# Patient Record
Sex: Female | Born: 1935
Health system: Southern US, Community
[De-identification: ages and names within clinical notes are randomized; demographics above are authoritative.]

## PROBLEM LIST (undated history)

## (undated) DIAGNOSIS — M810 Age-related osteoporosis without current pathological fracture: Secondary | ICD-10-CM

## (undated) DIAGNOSIS — D6851 Activated protein C resistance: Secondary | ICD-10-CM

## (undated) DIAGNOSIS — N189 Chronic kidney disease, unspecified: Secondary | ICD-10-CM

## (undated) DIAGNOSIS — K56609 Unspecified intestinal obstruction, unspecified as to partial versus complete obstruction: Secondary | ICD-10-CM

## (undated) DIAGNOSIS — J189 Pneumonia, unspecified organism: Secondary | ICD-10-CM

## (undated) DIAGNOSIS — I10 Essential (primary) hypertension: Secondary | ICD-10-CM

## (undated) DIAGNOSIS — R490 Dysphonia: Secondary | ICD-10-CM

## (undated) DIAGNOSIS — I82409 Acute embolism and thrombosis of unspecified deep veins of unspecified lower extremity: Secondary | ICD-10-CM

## (undated) HISTORY — PX: ABDOMINAL HYSTERECTOMY: SHX81

## (undated) HISTORY — PX: TONSILLECTOMY: SUR1361

## (undated) HISTORY — PX: APPENDECTOMY: SHX54

## (undated) HISTORY — DX: Chronic kidney disease, unspecified: N18.9

---

## 1999-08-25 ENCOUNTER — Ambulatory Visit (HOSPITAL_COMMUNITY): Admission: RE | Admit: 1999-08-25 | Discharge: 1999-08-25 | Payer: Self-pay | Admitting: Hematology & Oncology

## 1999-12-07 ENCOUNTER — Inpatient Hospital Stay (HOSPITAL_COMMUNITY): Admission: RE | Admit: 1999-12-07 | Discharge: 1999-12-10 | Payer: Self-pay | Admitting: Internal Medicine

## 2000-12-21 ENCOUNTER — Other Ambulatory Visit: Admission: RE | Admit: 2000-12-21 | Discharge: 2000-12-21 | Payer: Self-pay | Admitting: General Surgery

## 2002-05-06 ENCOUNTER — Other Ambulatory Visit: Admission: RE | Admit: 2002-05-06 | Discharge: 2002-05-06 | Payer: Self-pay | Admitting: Family Medicine

## 2004-10-27 ENCOUNTER — Ambulatory Visit: Payer: Self-pay | Admitting: Hematology & Oncology

## 2005-04-26 ENCOUNTER — Ambulatory Visit: Payer: Self-pay | Admitting: Hematology & Oncology

## 2005-05-23 ENCOUNTER — Other Ambulatory Visit: Admission: RE | Admit: 2005-05-23 | Discharge: 2005-05-23 | Payer: Self-pay | Admitting: Family Medicine

## 2005-10-26 ENCOUNTER — Ambulatory Visit: Payer: Self-pay | Admitting: Hematology & Oncology

## 2006-10-20 ENCOUNTER — Ambulatory Visit: Payer: Self-pay | Admitting: Hematology & Oncology

## 2006-10-25 LAB — CBC WITH DIFFERENTIAL/PLATELET
Basophils Absolute: 0 10*3/uL (ref 0.0–0.1)
Eosinophils Absolute: 0.1 10*3/uL (ref 0.0–0.5)
HGB: 13.9 g/dL (ref 11.6–15.9)
LYMPH%: 40.1 % (ref 14.0–48.0)
MONO#: 0.6 10*3/uL (ref 0.1–0.9)
NEUT#: 3.5 10*3/uL (ref 1.5–6.5)
Platelets: 129 10*3/uL — ABNORMAL LOW (ref 145–400)
RBC: 4.63 10*6/uL (ref 3.70–5.32)
WBC: 7 10*3/uL (ref 3.9–10.0)

## 2006-10-25 LAB — CHCC SMEAR

## 2012-12-31 ENCOUNTER — Emergency Department (HOSPITAL_COMMUNITY): Payer: Medicare Other

## 2012-12-31 ENCOUNTER — Encounter (HOSPITAL_COMMUNITY): Payer: Self-pay | Admitting: *Deleted

## 2012-12-31 ENCOUNTER — Inpatient Hospital Stay (HOSPITAL_COMMUNITY)
Admission: EM | Admit: 2012-12-31 | Discharge: 2013-01-28 | DRG: 853 | Disposition: A | Discharge status: Skilled Nursing Facility | Payer: Medicare Other | Attending: Internal Medicine | Admitting: Internal Medicine

## 2012-12-31 DIAGNOSIS — I129 Hypertensive chronic kidney disease with stage 1 through stage 4 chronic kidney disease, or unspecified chronic kidney disease: Secondary | ICD-10-CM | POA: Diagnosis present

## 2012-12-31 DIAGNOSIS — K56609 Unspecified intestinal obstruction, unspecified as to partial versus complete obstruction: Secondary | ICD-10-CM | POA: Diagnosis present

## 2012-12-31 DIAGNOSIS — D638 Anemia in other chronic diseases classified elsewhere: Secondary | ICD-10-CM | POA: Diagnosis present

## 2012-12-31 DIAGNOSIS — I4891 Unspecified atrial fibrillation: Secondary | ICD-10-CM | POA: Diagnosis present

## 2012-12-31 DIAGNOSIS — R571 Hypovolemic shock: Secondary | ICD-10-CM | POA: Diagnosis not present

## 2012-12-31 DIAGNOSIS — D62 Acute posthemorrhagic anemia: Secondary | ICD-10-CM | POA: Diagnosis not present

## 2012-12-31 DIAGNOSIS — N170 Acute kidney failure with tubular necrosis: Secondary | ICD-10-CM | POA: Diagnosis present

## 2012-12-31 DIAGNOSIS — D72829 Elevated white blood cell count, unspecified: Secondary | ICD-10-CM | POA: Diagnosis present

## 2012-12-31 DIAGNOSIS — I10 Essential (primary) hypertension: Secondary | ICD-10-CM | POA: Diagnosis present

## 2012-12-31 DIAGNOSIS — J189 Pneumonia, unspecified organism: Secondary | ICD-10-CM

## 2012-12-31 DIAGNOSIS — J690 Pneumonitis due to inhalation of food and vomit: Secondary | ICD-10-CM | POA: Diagnosis present

## 2012-12-31 DIAGNOSIS — R5381 Other malaise: Secondary | ICD-10-CM | POA: Diagnosis present

## 2012-12-31 DIAGNOSIS — A419 Sepsis, unspecified organism: Principal | ICD-10-CM | POA: Diagnosis not present

## 2012-12-31 DIAGNOSIS — R739 Hyperglycemia, unspecified: Secondary | ICD-10-CM

## 2012-12-31 DIAGNOSIS — N179 Acute kidney failure, unspecified: Secondary | ICD-10-CM | POA: Diagnosis present

## 2012-12-31 DIAGNOSIS — N189 Chronic kidney disease, unspecified: Secondary | ICD-10-CM | POA: Diagnosis present

## 2012-12-31 DIAGNOSIS — E860 Dehydration: Secondary | ICD-10-CM | POA: Diagnosis present

## 2012-12-31 DIAGNOSIS — J90 Pleural effusion, not elsewhere classified: Secondary | ICD-10-CM

## 2012-12-31 DIAGNOSIS — E871 Hypo-osmolality and hyponatremia: Secondary | ICD-10-CM | POA: Diagnosis present

## 2012-12-31 DIAGNOSIS — R7309 Other abnormal glucose: Secondary | ICD-10-CM

## 2012-12-31 DIAGNOSIS — D696 Thrombocytopenia, unspecified: Secondary | ICD-10-CM | POA: Diagnosis present

## 2012-12-31 DIAGNOSIS — J9600 Acute respiratory failure, unspecified whether with hypoxia or hypercapnia: Secondary | ICD-10-CM | POA: Diagnosis not present

## 2012-12-31 DIAGNOSIS — D6859 Other primary thrombophilia: Secondary | ICD-10-CM | POA: Diagnosis present

## 2012-12-31 DIAGNOSIS — R490 Dysphonia: Secondary | ICD-10-CM | POA: Diagnosis present

## 2012-12-31 DIAGNOSIS — R6521 Severe sepsis with septic shock: Secondary | ICD-10-CM | POA: Diagnosis present

## 2012-12-31 DIAGNOSIS — R0902 Hypoxemia: Secondary | ICD-10-CM | POA: Diagnosis present

## 2012-12-31 DIAGNOSIS — R1310 Dysphagia, unspecified: Secondary | ICD-10-CM | POA: Diagnosis present

## 2012-12-31 HISTORY — DX: Age-related osteoporosis without current pathological fracture: M81.0

## 2012-12-31 HISTORY — DX: Activated protein C resistance: D68.51

## 2012-12-31 HISTORY — DX: Essential (primary) hypertension: I10

## 2012-12-31 HISTORY — DX: Dysphonia: R49.0

## 2012-12-31 HISTORY — DX: Pneumonia, unspecified organism: J18.9

## 2012-12-31 HISTORY — DX: Acute embolism and thrombosis of unspecified deep veins of unspecified lower extremity: I82.409

## 2012-12-31 LAB — CBC WITH DIFFERENTIAL/PLATELET
Basophils Relative: 0 % (ref 0–1)
Hemoglobin: 16.5 g/dL — ABNORMAL HIGH (ref 12.0–15.0)
Lymphs Abs: 1.8 10*3/uL (ref 0.7–4.0)
MCHC: 34.4 g/dL (ref 30.0–36.0)
Monocytes Relative: 6 % (ref 3–12)
Neutro Abs: 7.5 10*3/uL (ref 1.7–7.7)
Neutrophils Relative %: 76 % (ref 43–77)
RBC: 5.46 MIL/uL — ABNORMAL HIGH (ref 3.87–5.11)

## 2012-12-31 LAB — CG4 I-STAT (LACTIC ACID): Lactic Acid, Venous: 3.57 mmol/L — ABNORMAL HIGH (ref 0.5–2.2)

## 2012-12-31 LAB — POCT I-STAT TROPONIN I: Troponin i, poc: 0.07 ng/mL (ref 0.00–0.08)

## 2012-12-31 LAB — COMPREHENSIVE METABOLIC PANEL
ALT: 14 U/L (ref 0–35)
AST: 26 U/L (ref 0–37)
Albumin: 3.2 g/dL — ABNORMAL LOW (ref 3.5–5.2)
CO2: 28 mEq/L (ref 19–32)
Calcium: 9.2 mg/dL (ref 8.4–10.5)
GFR calc non Af Amer: 17 mL/min — ABNORMAL LOW (ref >90)
Sodium: 125 mEq/L — ABNORMAL LOW (ref 135–145)
Total Protein: 6.7 g/dL (ref 6.0–8.3)

## 2012-12-31 MED ORDER — METRONIDAZOLE IN NACL 5-0.79 MG/ML-% IV SOLN
500.0000 mg | Freq: Once | INTRAVENOUS | Status: DC
  Filled 2012-12-31: qty 100

## 2012-12-31 MED ORDER — PANTOPRAZOLE SODIUM 40 MG IV SOLR
40.0000 mg | INTRAVENOUS | Status: DC
  Administered 2012-12-31 – 2013-01-09 (×9): 40 mg via INTRAVENOUS
  Filled 2012-12-31 (×11): qty 40

## 2012-12-31 MED ORDER — INSULIN ASPART 100 UNIT/ML ~~LOC~~ SOLN
0.0000 [IU] | SUBCUTANEOUS | Status: DC
  Administered 2013-01-01: 2 [IU] via SUBCUTANEOUS
  Administered 2013-01-01 (×2): 3 [IU] via SUBCUTANEOUS
  Administered 2013-01-02 – 2013-01-08 (×4): 2 [IU] via SUBCUTANEOUS
  Administered 2013-01-08: 3 [IU] via SUBCUTANEOUS
  Administered 2013-01-08: 5 [IU] via SUBCUTANEOUS
  Administered 2013-01-09: 3 [IU] via SUBCUTANEOUS
  Administered 2013-01-09: 2 [IU] via SUBCUTANEOUS

## 2012-12-31 MED ORDER — IOHEXOL 300 MG/ML  SOLN
50.0000 mL | Freq: Once | INTRAMUSCULAR | Status: AC | PRN
  Administered 2012-12-31: 50 mL via ORAL

## 2012-12-31 MED ORDER — SODIUM CHLORIDE 0.9 % IV BOLUS (SEPSIS): 1000.0000 mL | Freq: Once | INTRAVENOUS | Status: DC

## 2012-12-31 MED ORDER — SODIUM CHLORIDE 0.9 % IV BOLUS (SEPSIS)
1000.0000 mL | Freq: Once | INTRAVENOUS | Status: AC
  Administered 2012-12-31: 1000 mL via INTRAVENOUS

## 2012-12-31 MED ORDER — SODIUM CHLORIDE 0.9 % IV SOLN
INTRAVENOUS | Status: DC
  Administered 2013-01-01 – 2013-01-04 (×10): via INTRAVENOUS

## 2012-12-31 MED ORDER — PIPERACILLIN-TAZOBACTAM 3.375 G IVPB 30 MIN
3.3750 g | Freq: Once | INTRAVENOUS | Status: AC
  Administered 2012-12-31: 3.375 g via INTRAVENOUS
  Filled 2012-12-31: qty 50

## 2012-12-31 MED ORDER — SODIUM CHLORIDE 0.9 % IV BOLUS (SEPSIS)
1000.0000 mL | Freq: Once | INTRAVENOUS | Status: AC
  Administered 2013-01-01: 1000 mL via INTRAVENOUS

## 2012-12-31 NOTE — ED Notes (Signed)
MD at bedside. Surgery.

## 2012-12-31 NOTE — ED Provider Notes (Signed)
History     CSN: [id number]  Arrival date & time 12/31/12  1704   First MD Initiated Contact with Patient 12/31/12 1836      Chief Complaint  Patient presents with  . Weakness  . Fatigue    (Consider location/radiation/quality/duration/timing/severity/associated sxs/prior treatment) HPI 79yo female presents with... 3 days ago and 2 days ago had several episodes nonbloody emesis, no diarrhea, diffuse abd pain 24/7 4 days off and on, several hours at a time, mild to moderate pain, last BM today normal.  Gradual onset generalized weakness for the last 4 days worse today where she is mildly short of breath with activity but not at rest today and presyncopal unable to walk today due to generalized weakness and lightheadedness. No falls no trauma no headache no neck pain no confusion no cough no chest pain. No treatment prior to arrival. Sent to the ED by primary care doctor's office where she was seen prior to arrival. Lives at home alone and normally walks unassisted. Was hypotensive with systolic pressures 70s prior to arrival her primary care doctor's office. EMS found her with a systolic blood pressure of 90 at the clinic and she did receive 1 L IV fluid bolus.  Past Medical History  Diagnosis Date  . Osteoporosis   . DVT (deep venous thrombosis)   . Factor V Leiden   . Hypertension   . Dysphonia     Past Surgical History  Procedure Laterality Date  . Tonsillectomy    . Appendectomy    . Abdominal hysterectomy    . Laparotomy N/A 01/01/2013    Procedure: EXPLORATORY LAPAROTOMY removal of smal bowel foreign body;  Surgeon: Lodema Pilot, DO;  Location: [facility];  Service: General;  Laterality: N/A;    History reviewed. No pertinent family history.  History  Substance Use Topics  . Smoking status: Former Games developer  . Smokeless tobacco: Not on file  . Alcohol Use: Yes     Comment: occasionally    OB History   Grav Para Term Preterm Abortions TAB SAB Ect Mult Living                   Review of Systems 10 Systems reviewed and are negative for acute change except as noted in the HPI. Allergies  Diovan; Hctz; Tekamlo; and Valturna  Home Medications   No current outpatient prescriptions on file.  BP 167/114  Pulse 87  Temp(Src) 97 F (36.1 C) (Oral)  Resp 18  Ht 5\' 3"  (1.6 m)  Wt 154 lb 1.6 oz (69.9 kg)  BMI 27.3 kg/m2  SpO2 96%  Physical Exam  Nursing note and vitals reviewed. Constitutional:  Awake, alert, nontoxic appearance.  HENT:  Head: Atraumatic.  Eyes: Right eye exhibits no discharge. Left eye exhibits no discharge.  Neck: Neck supple.  Cardiovascular: Normal rate and regular rhythm.   No murmur heard. Pulmonary/Chest: She is in respiratory distress. She has no wheezes. She has rales. She exhibits no tenderness.  Crackles halfway up left posterior, also right base, no wheezes, hypoxic RA sat 83%  Abdominal: Soft. Bowel sounds are normal. She exhibits no distension and no mass. There is tenderness. There is no rebound and no guarding.  Mildly tender diffusely without rebound  Musculoskeletal: She exhibits no edema and no tenderness.  Baseline ROM, no obvious new focal weakness.  Neurological: She is alert.  Mental status and motor strength appears baseline for patient and situation.  Skin: No rash noted.  Psychiatric: She has a  normal mood and affect.    ED Course  Procedures (including critical care time) ECG: Sinus rhythm, ventricular rate 99, normal axis, no definite acute ischemic changes noted, no comparison ECG immediately available  2055- clinically the patient still is awake alert nontoxic in appearance with mild abdominal tenderness without rebound, however her labs and imaging reveal critical illness, her pulse oximetry is apparently fluctuated between the 80s to 90s on nasal cannula oxygen so she will be switched to oxygen mask which maintained sat in 90s, she will receive a NG tube attempted placement, further IV fluid bolus  and antibiotics, I discussed the case with general surgery will see the patient in the ED, and also paging the hospitalist service, Patient / Family / Caregiver informed of clinical course, understand medical decision-making process, and agree with plan.  [facility] will se Pt in ED after eval. By Surg. 2120 2210- Surg requests PCCM pro-op eval instead of [facility], Pt/family aware may go to OR tonight. 2220-PCCM will see in ED for admit.  CRITICAL CARE Performed by: Hurman Horn   Total critical care time:  Critical care time was exclusive of separately billable procedures and treating other patients.  Critical care was necessary to treat or prevent imminent or life-threatening deterioration.  Critical care was time spent personally by me on the following activities: development of treatment plan with patient and/or surrogate as well as nursing, discussions with consultants, evaluation of patient's response to treatment, examination of patient, obtaining history from patient or surrogate, ordering and performing treatments and interventions, ordering and review of laboratory studies, ordering and review of radiographic studies, pulse oximetry and re-evaluation of patient's condition.  Labs Reviewed  CBC WITH DIFFERENTIAL - Abnormal; Notable for the following:    RBC 5.46 (*)    Hemoglobin 16.5 (*)    HCT 48.0 (*)    All other components within normal limits  COMPREHENSIVE METABOLIC PANEL - Abnormal; Notable for the following:    Sodium 125 (*)    Chloride 78 (*)    Glucose, Bld 175 (*)    BUN 79 (*)    Creatinine, Ser 2.49 (*)    Albumin 3.2 (*)    Total Bilirubin 1.6 (*)    GFR calc non Af Amer 17 (*)    GFR calc Af Amer 20 (*)    All other components within normal limits  LIPASE, BLOOD - Abnormal; Notable for the following:    Lipase 162 (*)    All other components within normal limits  PRO B NATRIURETIC PEPTIDE - Abnormal; Notable for the following:    Pro B Natriuretic  peptide (BNP) 3133.0 (*)    All other components within normal limits  BASIC METABOLIC PANEL - Abnormal; Notable for the following:    Sodium 127 (*)    Chloride 88 (*)    Glucose, Bld 135 (*)    BUN 78 (*)    Creatinine, Ser 2.41 (*)    Calcium 7.4 (*)    GFR calc non Af Amer 18 (*)    GFR calc Af Amer 21 (*)    All other components within normal limits  PHOSPHORUS - Abnormal; Notable for the following:    Phosphorus 5.0 (*)    All other components within normal limits  PROTIME-INR - Abnormal; Notable for the following:    Prothrombin Time 16.0 (*)    All other components within normal limits  BASIC METABOLIC PANEL - Abnormal; Notable for the following:    Sodium 125 (*)  Chloride 82 (*)    Glucose, Bld 159 (*)    BUN 81 (*)    Creatinine, Ser 2.65 (*)    Calcium 7.9 (*)    GFR calc non Af Amer 16 (*)    GFR calc Af Amer 18 (*)    All other components within normal limits  GLUCOSE, CAPILLARY - Abnormal; Notable for the following:    Glucose-Capillary 162 (*)    All other components within normal limits  GLUCOSE, CAPILLARY - Abnormal; Notable for the following:    Glucose-Capillary 146 (*)    All other components within normal limits  GLUCOSE, CAPILLARY - Abnormal; Notable for the following:    Glucose-Capillary 115 (*)    All other components within normal limits  BLOOD GAS, ARTERIAL - Abnormal; Notable for the following:    pH, Arterial 7.341 (*)    pO2, Arterial 233.0 (*)    Acid-base deficit 4.6 (*)    All other components within normal limits  GLUCOSE, CAPILLARY - Abnormal; Notable for the following:    Glucose-Capillary 107 (*)    All other components within normal limits  CBC - Abnormal; Notable for the following:    WBC 11.7 (*)    HCT 35.7 (*)    All other components within normal limits  PHOSPHORUS - Abnormal; Notable for the following:    Phosphorus 5.4 (*)    All other components within normal limits  BASIC METABOLIC PANEL - Abnormal; Notable for the  following:    Sodium 129 (*)    Chloride 95 (*)    Glucose, Bld 151 (*)    BUN 79 (*)    Creatinine, Ser 3.04 (*)    Calcium 6.4 (*)    GFR calc non Af Amer 13 (*)    GFR calc Af Amer 16 (*)    All other components within normal limits  BLOOD GAS, ARTERIAL - Abnormal; Notable for the following:    pH, Arterial 7.288 (*)    pO2, Arterial 146.0 (*)    Bicarbonate 17.2 (*)    Acid-base deficit 8.1 (*)    All other components within normal limits  GLUCOSE, CAPILLARY - Abnormal; Notable for the following:    Glucose-Capillary 114 (*)    All other components within normal limits  BLOOD GAS, ARTERIAL - Abnormal; Notable for the following:    pH, Arterial 7.297 (*)    pO2, Arterial 109.0 (*)    Bicarbonate 18.2 (*)    Acid-base deficit 7.1 (*)    All other components within normal limits  BLOOD GAS, ARTERIAL - Abnormal; Notable for the following:    pH, Arterial 7.346 (*)    pO2, Arterial 124.0 (*)    Bicarbonate 19.7 (*)    Acid-base deficit 4.8 (*)    All other components within normal limits  GLUCOSE, CAPILLARY - Abnormal; Notable for the following:    Glucose-Capillary 174 (*)    All other components within normal limits  GLUCOSE, CAPILLARY - Abnormal; Notable for the following:    Glucose-Capillary 131 (*)    All other components within normal limits  GLUCOSE, CAPILLARY - Abnormal; Notable for the following:    Glucose-Capillary 112 (*)    All other components within normal limits  BLOOD GAS, ARTERIAL - Abnormal; Notable for the following:    pCO2 arterial 28.4 (*)    pO2, Arterial 116.0 (*)    Bicarbonate 18.5 (*)    Acid-base deficit 4.1 (*)    All other components within  normal limits  GLUCOSE, CAPILLARY - Abnormal; Notable for the following:    Glucose-Capillary 110 (*)    All other components within normal limits  GLUCOSE, CAPILLARY - Abnormal; Notable for the following:    Glucose-Capillary 111 (*)    All other components within normal limits  CBC - Abnormal;  Notable for the following:    WBC 11.5 (*)    RBC 3.40 (*)    Hemoglobin 10.3 (*)    HCT 28.9 (*)    Platelets 142 (*)    All other components within normal limits  COMPREHENSIVE METABOLIC PANEL - Abnormal; Notable for the following:    Sodium 131 (*)    Potassium 2.8 (*)    CO2 18 (*)    Glucose, Bld 115 (*)    BUN 77 (*)    Creatinine, Ser 3.01 (*)    Calcium 6.3 (*)    Total Protein 4.2 (*)    Albumin 1.3 (*)    GFR calc non Af Amer 14 (*)    GFR calc Af Amer 16 (*)    All other components within normal limits  GLUCOSE, CAPILLARY - Abnormal; Notable for the following:    Glucose-Capillary 115 (*)    All other components within normal limits  GLUCOSE, CAPILLARY - Abnormal; Notable for the following:    Glucose-Capillary 110 (*)    All other components within normal limits  GLUCOSE, CAPILLARY - Abnormal; Notable for the following:    Glucose-Capillary 119 (*)    All other components within normal limits  BLOOD GAS, ARTERIAL - Abnormal; Notable for the following:    pH, Arterial 7.334 (*)    pCO2 arterial 31.2 (*)    pO2, Arterial 74.8 (*)    Bicarbonate 16.1 (*)    Acid-base deficit 8.3 (*)    All other components within normal limits  BASIC METABOLIC PANEL - Abnormal; Notable for the following:    Sodium 133 (*)    CO2 17 (*)    Glucose, Bld 102 (*)    BUN 75 (*)    Creatinine, Ser 3.05 (*)    Calcium 6.4 (*)    GFR calc non Af Amer 13 (*)    GFR calc Af Amer 15 (*)    All other components within normal limits  GLUCOSE, CAPILLARY - Abnormal; Notable for the following:    Glucose-Capillary 105 (*)    All other components within normal limits  BASIC METABOLIC PANEL - Abnormal; Notable for the following:    Sodium 134 (*)    CO2 16 (*)    BUN 75 (*)    Creatinine, Ser 3.11 (*)    Calcium 7.0 (*)    GFR calc non Af Amer 13 (*)    GFR calc Af Amer 15 (*)    All other components within normal limits  CBC - Abnormal; Notable for the following:    WBC 11.0 (*)     Hemoglobin 11.8 (*)    HCT 34.8 (*)    Platelets 107 (*)    All other components within normal limits  GLUCOSE, CAPILLARY - Abnormal; Notable for the following:    Glucose-Capillary 103 (*)    All other components within normal limits  GLUCOSE, CAPILLARY - Abnormal; Notable for the following:    Glucose-Capillary 106 (*)    All other components within normal limits  CBC - Abnormal; Notable for the following:    WBC 13.6 (*)    Hemoglobin 11.5 (*)  HCT 34.6 (*)    Platelets 113 (*)    All other components within normal limits  BASIC METABOLIC PANEL - Abnormal; Notable for the following:    CO2 14 (*)    Glucose, Bld 109 (*)    BUN 78 (*)    Creatinine, Ser 3.35 (*)    Calcium 8.1 (*)    GFR calc non Af Amer 12 (*)    GFR calc Af Amer 14 (*)    All other components within normal limits  PHOSPHORUS - Abnormal; Notable for the following:    Phosphorus 7.0 (*)    All other components within normal limits  GLUCOSE, CAPILLARY - Abnormal; Notable for the following:    Glucose-Capillary 100 (*)    All other components within normal limits  GLUCOSE, CAPILLARY - Abnormal; Notable for the following:    Glucose-Capillary 105 (*)    All other components within normal limits  GLUCOSE, CAPILLARY - Abnormal; Notable for the following:    Glucose-Capillary 112 (*)    All other components within normal limits  PRO B NATRIURETIC PEPTIDE - Abnormal; Notable for the following:    Pro B Natriuretic peptide (BNP) 6137.0 (*)    All other components within normal limits  BASIC METABOLIC PANEL - Abnormal; Notable for the following:    CO2 15 (*)    Glucose, Bld 115 (*)    BUN 84 (*)    Creatinine, Ser 3.83 (*)    GFR calc non Af Amer 10 (*)    GFR calc Af Amer 12 (*)    All other components within normal limits  PHOSPHORUS - Abnormal; Notable for the following:    Phosphorus 7.5 (*)    All other components within normal limits  BLOOD GAS, ARTERIAL - Abnormal; Notable for the following:     pH, Arterial 7.232 (*)    pCO2 arterial 31.7 (*)    Bicarbonate 12.8 (*)    Acid-base deficit 13.4 (*)    All other components within normal limits  CBC - Abnormal; Notable for the following:    WBC 12.2 (*)    HCT 35.3 (*)    RDW 15.6 (*)    All other components within normal limits  GLUCOSE, CAPILLARY - Abnormal; Notable for the following:    Glucose-Capillary 110 (*)    All other components within normal limits  GLUCOSE, CAPILLARY - Abnormal; Notable for the following:    Glucose-Capillary 109 (*)    All other components within normal limits  GLUCOSE, CAPILLARY - Abnormal; Notable for the following:    Glucose-Capillary 114 (*)    All other components within normal limits  GLUCOSE, CAPILLARY - Abnormal; Notable for the following:    Glucose-Capillary 113 (*)    All other components within normal limits  BASIC METABOLIC PANEL - Abnormal; Notable for the following:    CO2 14 (*)    Glucose, Bld 111 (*)    BUN 95 (*)    Creatinine, Ser 4.50 (*)    GFR calc non Af Amer 8 (*)    GFR calc Af Amer 10 (*)    All other components within normal limits  PRO B NATRIURETIC PEPTIDE - Abnormal; Notable for the following:    Pro B Natriuretic peptide (BNP) 6067.0 (*)    All other components within normal limits  GLUCOSE, CAPILLARY - Abnormal; Notable for the following:    Glucose-Capillary 110 (*)    All other components within normal limits  GLUCOSE, CAPILLARY -  Abnormal; Notable for the following:    Glucose-Capillary 106 (*)    All other components within normal limits  RENAL FUNCTION PANEL - Abnormal; Notable for the following:    CO2 14 (*)    Glucose, Bld 111 (*)    BUN 97 (*)    Creatinine, Ser 4.52 (*)    Phosphorus 7.9 (*)    Albumin 1.2 (*)    GFR calc non Af Amer 8 (*)    GFR calc Af Amer 10 (*)    All other components within normal limits  GLUCOSE, CAPILLARY - Abnormal; Notable for the following:    Glucose-Capillary 101 (*)    All other components within normal  limits  PHOSPHORUS - Abnormal; Notable for the following:    Phosphorus 7.6 (*)    All other components within normal limits  URINALYSIS, ROUTINE W REFLEX MICROSCOPIC - Abnormal; Notable for the following:    Hgb urine dipstick MODERATE (*)    Protein, ur 30 (*)    Leukocytes, UA TRACE (*)    All other components within normal limits  URIC ACID - Abnormal; Notable for the following:    Uric Acid, Serum 9.2 (*)    All other components within normal limits  GLUCOSE, CAPILLARY - Abnormal; Notable for the following:    Glucose-Capillary 122 (*)    All other components within normal limits  URINE MICROSCOPIC-ADD ON - Abnormal; Notable for the following:    Bacteria, UA FEW (*)    All other components within normal limits  URINALYSIS, ROUTINE W REFLEX MICROSCOPIC - Abnormal; Notable for the following:    APPearance HAZY (*)    Hgb urine dipstick MODERATE (*)    Leukocytes, UA SMALL (*)    All other components within normal limits  GLUCOSE, CAPILLARY - Abnormal; Notable for the following:    Glucose-Capillary 104 (*)    All other components within normal limits  URINE MICROSCOPIC-ADD ON - Abnormal; Notable for the following:    Crystals URIC ACID CRYSTALS (*)    All other components within normal limits  GLUCOSE, CAPILLARY - Abnormal; Notable for the following:    Glucose-Capillary 125 (*)    All other components within normal limits  CBC - Abnormal; Notable for the following:    RBC 3.63 (*)    Hemoglobin 10.6 (*)    HCT 30.4 (*)    Platelets 129 (*)    All other components within normal limits  RENAL FUNCTION PANEL - Abnormal; Notable for the following:    CO2 15 (*)    Glucose, Bld 135 (*)    BUN 102 (*)    Creatinine, Ser 4.86 (*)    Phosphorus 7.6 (*)    Albumin 2.5 (*)    GFR calc non Af Amer 8 (*)    GFR calc Af Amer 9 (*)    All other components within normal limits  GLUCOSE, CAPILLARY - Abnormal; Notable for the following:    Glucose-Capillary 129 (*)    All other  components within normal limits  GLUCOSE, CAPILLARY - Abnormal; Notable for the following:    Glucose-Capillary 155 (*)    All other components within normal limits  CBC - Abnormal; Notable for the following:    RBC 3.27 (*)    Hemoglobin 9.5 (*)    HCT 26.9 (*)    Platelets 123 (*)    All other components within normal limits  RENAL FUNCTION PANEL - Abnormal; Notable for the following:  Potassium 3.0 (*)    BUN 65 (*)    Creatinine, Ser 3.79 (*)    Calcium 8.2 (*)    Phosphorus 5.1 (*)    Albumin 2.8 (*)    GFR calc non Af Amer 10 (*)    GFR calc Af Amer 12 (*)    All other components within normal limits  GLUCOSE, CAPILLARY - Abnormal; Notable for the following:    Glucose-Capillary 210 (*)    All other components within normal limits  GLUCOSE, CAPILLARY - Abnormal; Notable for the following:    Glucose-Capillary 163 (*)    All other components within normal limits  GLUCOSE, CAPILLARY - Abnormal; Notable for the following:    Glucose-Capillary 117 (*)    All other components within normal limits  GLUCOSE, CAPILLARY - Abnormal; Notable for the following:    Glucose-Capillary 192 (*)    All other components within normal limits  CBC - Abnormal; Notable for the following:    RBC 3.47 (*)    Hemoglobin 10.1 (*)    HCT 29.4 (*)    Platelets 108 (*)    All other components within normal limits  RENAL FUNCTION PANEL - Abnormal; Notable for the following:    Glucose, Bld 148 (*)    BUN 42 (*)    Creatinine, Ser 3.27 (*)    Albumin 2.7 (*)    GFR calc non Af Amer 12 (*)    GFR calc Af Amer 14 (*)    All other components within normal limits  GLUCOSE, CAPILLARY - Abnormal; Notable for the following:    Glucose-Capillary 103 (*)    All other components within normal limits  GLUCOSE, CAPILLARY - Abnormal; Notable for the following:    Glucose-Capillary 133 (*)    All other components within normal limits  GLUCOSE, CAPILLARY - Abnormal; Notable for the following:     Glucose-Capillary 158 (*)    All other components within normal limits  GLUCOSE, CAPILLARY - Abnormal; Notable for the following:    Glucose-Capillary 221 (*)    All other components within normal limits  GLUCOSE, CAPILLARY - Abnormal; Notable for the following:    Glucose-Capillary 163 (*)    All other components within normal limits  CBC - Abnormal; Notable for the following:    RBC 3.44 (*)    Hemoglobin 10.0 (*)    HCT 29.1 (*)    Platelets 135 (*)    All other components within normal limits  RENAL FUNCTION PANEL - Abnormal; Notable for the following:    Potassium 3.4 (*)    Glucose, Bld 140 (*)    BUN 34 (*)    Creatinine, Ser 3.38 (*)    Calcium 8.1 (*)    Albumin 2.4 (*)    GFR calc non Af Amer 12 (*)    GFR calc Af Amer 14 (*)    All other components within normal limits  GLUCOSE, CAPILLARY - Abnormal; Notable for the following:    Glucose-Capillary 249 (*)    All other components within normal limits  GLUCOSE, CAPILLARY - Abnormal; Notable for the following:    Glucose-Capillary 112 (*)    All other components within normal limits  GLUCOSE, CAPILLARY - Abnormal; Notable for the following:    Glucose-Capillary 115 (*)    All other components within normal limits  GLUCOSE, CAPILLARY - Abnormal; Notable for the following:    Glucose-Capillary 214 (*)    All other components within normal limits  GLUCOSE, CAPILLARY -  Abnormal; Notable for the following:    Glucose-Capillary 141 (*)    All other components within normal limits  RENAL FUNCTION PANEL - Abnormal; Notable for the following:    Potassium 3.2 (*)    Glucose, Bld 128 (*)    BUN 50 (*)    Creatinine, Ser 4.64 (*)    Calcium 8.2 (*)    Phosphorus 5.7 (*)    Albumin 2.2 (*)    GFR calc non Af Amer 8 (*)    GFR calc Af Amer 9 (*)    All other components within normal limits  HEMOGLOBIN A1C - Abnormal; Notable for the following:    Hemoglobin A1C 6.2 (*)    Mean Plasma Glucose 131 (*)    All other  components within normal limits  GLUCOSE, CAPILLARY - Abnormal; Notable for the following:    Glucose-Capillary 169 (*)    All other components within normal limits  GLUCOSE, CAPILLARY - Abnormal; Notable for the following:    Glucose-Capillary 144 (*)    All other components within normal limits  GLUCOSE, CAPILLARY - Abnormal; Notable for the following:    Glucose-Capillary 156 (*)    All other components within normal limits  GLUCOSE, CAPILLARY - Abnormal; Notable for the following:    Glucose-Capillary 174 (*)    All other components within normal limits  GLUCOSE, CAPILLARY - Abnormal; Notable for the following:    Glucose-Capillary 166 (*)    All other components within normal limits  RENAL FUNCTION PANEL - Abnormal; Notable for the following:    Sodium 134 (*)    Potassium 3.2 (*)    Glucose, Bld 125 (*)    BUN 67 (*)    Creatinine, Ser 5.89 (*)    Calcium 8.0 (*)    Phosphorus 7.6 (*)    Albumin 2.0 (*)    GFR calc non Af Amer 6 (*)    GFR calc Af Amer 7 (*)    All other components within normal limits  GLUCOSE, CAPILLARY - Abnormal; Notable for the following:    Glucose-Capillary 174 (*)    All other components within normal limits  GLUCOSE, CAPILLARY - Abnormal; Notable for the following:    Glucose-Capillary 120 (*)    All other components within normal limits  GLUCOSE, CAPILLARY - Abnormal; Notable for the following:    Glucose-Capillary 140 (*)    All other components within normal limits  GLUCOSE, CAPILLARY - Abnormal; Notable for the following:    Glucose-Capillary 162 (*)    All other components within normal limits  GLUCOSE, CAPILLARY - Abnormal; Notable for the following:    Glucose-Capillary 118 (*)    All other components within normal limits  GLUCOSE, CAPILLARY - Abnormal; Notable for the following:    Glucose-Capillary 136 (*)    All other components within normal limits  POCT I-STAT, CHEM 8 - Abnormal; Notable for the following:    Sodium 123 (*)     Potassium 7.3 (*)    Chloride 85 (*)    BUN 131 (*)    Creatinine, Ser 2.90 (*)    Glucose, Bld 181 (*)    Calcium, Ion 0.88 (*)    Hemoglobin 17.3 (*)    HCT 51.0 (*)    All other components within normal limits  CG4 I-STAT (LACTIC ACID) - Abnormal; Notable for the following:    Lactic Acid, Venous 3.57 (*)    All other components within normal limits  MRSA PCR SCREENING  CULTURE,  BLOOD (ROUTINE X 2)  CULTURE, BLOOD (ROUTINE X 2)  URINE CULTURE  CULTURE, RESPIRATORY (NON-EXPECTORATED)  URINE CULTURE  CBC  MAGNESIUM  APTT  LACTIC ACID, PLASMA  LACTIC ACID, PLASMA  LACTIC ACID, PLASMA  LACTIC ACID, PLASMA  LACTIC ACID, PLASMA  GLUCOSE, CAPILLARY  BLOOD GAS, ARTERIAL  MAGNESIUM  LACTIC ACID, PLASMA  TROPONIN I  GLUCOSE, CAPILLARY  GLUCOSE, CAPILLARY  GLUCOSE, CAPILLARY  GLUCOSE, CAPILLARY  GLUCOSE, CAPILLARY  GLUCOSE, CAPILLARY  MAGNESIUM  GLUCOSE, CAPILLARY  GLUCOSE, CAPILLARY  PROCALCITONIN  TROPONIN I  TROPONIN I  TROPONIN I  MAGNESIUM  PROCALCITONIN  GLUCOSE, CAPILLARY  GLUCOSE, CAPILLARY  GLUCOSE, CAPILLARY  PROCALCITONIN  GLUCOSE, CAPILLARY  GLUCOSE, CAPILLARY  HEPATITIS B SURFACE ANTIGEN  HEPATITIS B SURFACE ANTIBODY  HEPATITIS B CORE ANTIBODY, IGM  GLUCOSE, CAPILLARY  HEPATITIS B SURFACE ANTIGEN  HEPATITIS B CORE ANTIBODY, TOTAL  GLUCOSE, CAPILLARY  GLUCOSE, CAPILLARY  GLUCOSE, CAPILLARY  HEPATITIS B CORE ANTIBODY, TOTAL  HEPATITIS B SURFACE ANTIBODY  POCT I-STAT TROPONIN I  TYPE AND SCREEN  ABO/RH  SURGICAL PATHOLOGY   No results found.   1. SBO (small bowel obstruction)   2. Acute renal failure   3. Acute respiratory failure   4. Dehydration   5. Hyperglycemia   6. Hyponatremia   7. Small bowel obstruction   8. Aspiration pneumonia   9. Hypoxia   10. PNA (pneumonia)   11. HTN (hypertension)   12. Acute blood loss anemia   13. Sepsis(995.91)       MDM  The patient appears reasonably stabilized for admission considering  the current resources, flow, and capabilities available in the ED at this time, and I doubt any other [facility] requiring further screening and/or treatment in the ED prior to admission.        Hurman Horn, MD 01/14/13 [phone]

## 2012-12-31 NOTE — H&P (Signed)
PULMONARY  / CRITICAL CARE MEDICINE  Name: [name] MRN: [id number] DOB: 1929-05-20    ADMISSION DATE:  12/31/2012 CONSULTATION DATE:  12/31/2012  REFERRING MD :  EDP PRIMARY SERVICE:  PCCM  CHIEF COMPLAINT:  Abdominal pain  BRIEF PATIENT DESCRIPTION: 78 yo without significant medical problems who developed abdominal pain, nausea, and vomiting 3 days prior to presentation to [facility] ED.  Found to have high grade SBO with pneumatosis and portal venous gas.  Also, hypoxic with LLL airspace disease.  SIGNIFICANT EVENTS / STUDIES:  3/31  Presented with nausea, vomiting, abdominal pain 3/31  Abdominal CT >>> High grade distal SBO and evidence of bowel ischemia  LINES / TUBES: NGT 3/31 >>> Foley 3/31 >>>  CULTURES:  ANTIBIOTICS: Zosyn 3/31 >>>  The patient is in respiratory distress and unable to provide history, which was obtained for available medical records.  HISTORY OF PRESENT ILLNESS:  79 yo without significant medical problems who developed abdominal pain, nausea, and vomiting 3 days prior to presentation to [facility] ED.  Found to have high grade SBO with pneumatosis and portal venous gas.  Also, hypoxic with LLL airspace disease.  PAST MEDICAL HISTORY :  Past Medical History  Diagnosis Date  . Osteoporosis   . DVT (deep venous thrombosis)   . Factor V Leiden   . Hypertension   . Dysphonia    Past Surgical History  Procedure Laterality Date  . Tonsillectomy    . Appendectomy    . Abdominal hysterectomy     Prior to Admission medications   Medication Sig Start Date End Date Taking? Authorizing Provider  amLODipine (NORVASC) 10 MG tablet Take 10 mg by mouth daily.   Yes Historical Provider, MD  aspirin EC 81 MG tablet Take 81 mg by mouth daily.   Yes Historical Provider, MD  fexofenadine (ALLEGRA) 180 MG tablet Take 180 mg by mouth daily.   Yes Historical Provider, MD  irbesartan (AVAPRO) 300 MG tablet Take 300 mg by mouth at bedtime.   Yes Historical Provider, MD   Multiple Vitamin (MULTIVITAMIN WITH MINERALS) TABS Take 1 tablet by mouth daily.   Yes Historical Provider, MD  rosuvastatin (CRESTOR) 5 MG tablet Take 5 mg by mouth daily.   Yes Historical Provider, MD   Allergies  Allergen Reactions  . Diovan (Valsartan)   . Hctz (Hydrochlorothiazide)   . Tekamlo (Aliskiren-Amlodipine)   . Valturna (Aliskiren-Valsartan)    FAMILY HISTORY:  No family history on file.  SOCIAL HISTORY:  reports that she has quit smoking. She does not have any smokeless tobacco history on file. She reports that  drinks alcohol. She reports that she does not use illicit drugs.  REVIEW OF SYSTEMS:  Unable to provide.  INTERVAL HISTORY:  VITAL SIGNS: Temp:  [97.4 F (36.3 C)] 97.4 F (36.3 C) (03/31 1722) Pulse Rate:  [70-103] 103 (03/31 2222) Resp:  [19-26] 26 (03/31 2222) BP: (118-119)/(58) 118/58 mmHg (03/31 2222) SpO2:  [86 %-96 %] 93 % (03/31 2222) HEMODYNAMICS:   VENTILATOR SETTINGS:   INTAKE / OUTPUT: Intake/Output     03/31 0701 - 04/01 0700   Other 3000   Total Output 3000   Net -3000        PHYSICAL EXAMINATION: General:  Moderate respiratory distress Neuro:  Sleepy but arouses to stimulation, protects airway HEENT:  PERRL, NGT, dry membranes Cardiovascular:  Tachycardic, irregular Lungs:  Bilateral diminished air entry, no added sounds Abdomen:  Distended Musculoskeletal:  Moves all extremities, no edema Skin:  Intact  LABS:  Recent Labs Lab 12/31/12 1940 12/31/12 1950 12/31/12 1952  HGB 16.5* 17.3*  --   WBC 9.8  --   --   PLT 221  --   --   NA 125* 123*  --   K 3.8 7.3*  --   CL 78* 85*  --   CO2 28  --   --   GLUCOSE 175* 181*  --   BUN 79* 131*  --   CREATININE 2.49* 2.90*  --   CALCIUM 9.2  --   --   AST 26  --   --   ALT 14  --   --   ALKPHOS 69  --   --   BILITOT 1.6*  --   --   PROT 6.7  --   --   ALBUMIN 3.2*  --   --   LATICACIDVEN  --   --  3.57*  PROBNP 3133.0*  --   --    No results found for this  basename: GLUCAP,  in the last 168 hours  CXR:  3/31 >>> LLL airspace disease  ASSESSMENT / PLAN:  PULMONARY A:  Suspected aspiration pneumonia vs atelectasis.  Hypoxemic respiratory failure. P:   Gaol SpO2>92 Supplemental oxygen BiPAP contraindicated May need intubation / mechanical ventilation if remains hypoxic  CARDIOVASCULAR A: SIRS / sepsis. P:  Goal MAP>60 No vasopressors required at this time Trend Lactate Hold Norvasc, ASA, Crestor, Avapro  RENAL A:  Acute renal failure in setting of intravascular depletion / ARB.  Hyperkalemia vs lab error. P:   Trend BMP, resend now Ordered NS 1000 x 3 in ED NS@125   GASTROINTESTINAL A:  Distal SBO.  Suspected ischemic bowel. P:   NPO NGT to Sx Protonix for GI Px Seen by CCS, likely to OR later tonight  HEMATOLOGIC A:  Hemoconcentration.  H/o DVT, factor V Leiden. P:  Trend CBC SCDs  INFECTIOUS A:  Suspected aspiration pneumonia. P:   Antibiotics as above  ENDOCRINE  A:  Hyperglycemia.  No h/o DM. P:   SSI  NEUROLOGIC A:  No active issues. P:   No intervention required  TODAY'S SUMMARY: 79 yo without significant medical problems who developed abdominal pain, nausea, and vomiting 3 days prior to presentation to [facility] ED.  Found to have high grade SBO with pneumatosis and portal venous gas.  Also, hypoxic with LLL airspace disease as well as ARF.  Tonight - fluid resuscitation, likely to OR later tonight per CCS.  May need intubation.  BiPAP contraindicated.  eLink to follow repeat BMP (K) and monitor respiratory status.  I have personally obtained a history, examined the patient, evaluated laboratory and imaging results, formulated the assessment and plan and placed orders.  CRITICAL CARE:  The patient is critically ill with multiple organ systems failure and requires high complexity decision making for assessment and support, frequent evaluation and titration of therapies, application of advanced monitoring  technologies and extensive interpretation of multiple databases. Critical Care Time devoted to patient care services described in this note is 40 minutes.   Lonia Farber, MD Pulmonary and Critical Care Medicine [facility] ([facility] Pager: [phone]  12/31/2012, 11:15 PM

## 2012-12-31 NOTE — ED Notes (Signed)
[id number]<YN> Expected date:12/31/12<BR> Expected time: 5:00 PM<BR> Means of arrival:Ambulance<BR> Comments:<BR> Orthostatic, hypotension

## 2012-12-31 NOTE — Consult Note (Signed)
Reason for Consult:bowel obstruction Referring Physician: Kamillah Didonato is an 79 y.o. female.  HPI: this patient presents to the emergency room for evaluation of a 4 day history of nausea involving and feeling ill. She started vomiting on Friday and she felt that she had "food poisoning" so she went to the walk-in clinic at her primary care office on Saturday and received an anti-nausea shots which improved her symptoms. On Sunday she was feeling better but today again began to feel ill. She says that she has been passing some gas and had a bowel movement this morning. She does have abdominal distention but denies any fevers or chills. She did have some abdominal pain the last few days but now that she has an NG tube in place and 3 L of fluid have been suctioned from her stomach, she denies any abdominal pain. She does have a history of blood clots in her liver which was treated with one year of anticoagulation and then this was stopped. This was about 14 years ago. She denies any irregular heartbeats and is otherwise generally healthy. Surgery was called due to 2 a high-grade bowel obstruction found on CT scan as well as pneumatosis of the bowel and portal venous gas. She says that she had a normal cscope about 10 years ago.  Past Medical History  Diagnosis Date  . Osteoporosis   . DVT (deep venous thrombosis)   . Factor V Leiden   . Hypertension   . Dysphonia     Past Surgical History  Procedure Laterality Date  . Tonsillectomy    . Appendectomy    . Abdominal hysterectomy      No family history on file.  Social History:  reports that she has quit smoking. She does not have any smokeless tobacco history on file. She reports that  drinks alcohol. She reports that she does not use illicit drugs.  Allergies:  Allergies  Allergen Reactions  . Diovan (Valsartan)   . Hctz (Hydrochlorothiazide)   . Tekamlo (Aliskiren-Amlodipine)   . Valturna (Aliskiren-Valsartan)      Medications: I have reviewed the patient's current medications.  Results for orders placed during the hospital encounter of 12/31/12 (from the past 48 hour(s))  CBC WITH DIFFERENTIAL     Status: Abnormal   Collection Time    12/31/12  7:40 PM      Result Value Range   WBC 9.8  4.0 - 10.5 K/uL   RBC 5.46 (*) 3.87 - 5.11 MIL/uL   Hemoglobin 16.5 (*) 12.0 - 15.0 g/dL   HCT 40.9 (*) 81.1 - 91.4 %   MCV 87.9  78.0 - 100.0 fL   MCH 30.2  26.0 - 34.0 pg   MCHC 34.4  30.0 - 36.0 g/dL   RDW 78.2  95.6 - 21.3 %   Platelets 221  150 - 400 K/uL   Neutrophils Relative 76  43 - 77 %   Neutro Abs 7.5  1.7 - 7.7 K/uL   Lymphocytes Relative 18  12 - 46 %   Lymphs Abs 1.8  0.7 - 4.0 K/uL   Monocytes Relative 6  3 - 12 %   Monocytes Absolute 0.6  0.1 - 1.0 K/uL   Eosinophils Relative 0  0 - 5 %   Eosinophils Absolute 0.0  0.0 - 0.7 K/uL   Basophils Relative 0  0 - 1 %   Basophils Absolute 0.0  0.0 - 0.1 K/uL   WBC Morphology ATYPICAL LYMPHOCYTES  RBC Morphology POLYCHROMASIA PRESENT    COMPREHENSIVE METABOLIC PANEL     Status: Abnormal   Collection Time    12/31/12  7:40 PM      Result Value Range   Sodium 125 (*) 135 - 145 mEq/L   Potassium 3.8  3.5 - 5.1 mEq/L   Chloride 78 (*) 96 - 112 mEq/L   CO2 28  19 - 32 mEq/L   Glucose, Bld 175 (*) 70 - 99 mg/dL   BUN 79 (*) 6 - 23 mg/dL   Creatinine, Ser 2.95 (*) 0.50 - 1.10 mg/dL   Calcium 9.2  8.4 - 62.1 mg/dL   Total Protein 6.7  6.0 - 8.3 g/dL   Albumin 3.2 (*) 3.5 - 5.2 g/dL   AST 26  0 - 37 U/L   ALT 14  0 - 35 U/L   Alkaline Phosphatase 69  39 - 117 U/L   Total Bilirubin 1.6 (*) 0.3 - 1.2 mg/dL   GFR calc non Af Amer 17 (*) >90 mL/min   GFR calc Af Amer 20 (*) >90 mL/min   Comment:            The eGFR has been calculated     using the CKD EPI equation.     This calculation has not been     validated in all clinical     situations.     eGFR's persistently     <90 mL/min signify     possible Chronic Kidney Disease.   LIPASE, BLOOD     Status: Abnormal   Collection Time    12/31/12  7:40 PM      Result Value Range   Lipase 162 (*) 11 - 59 U/L  PRO B NATRIURETIC PEPTIDE     Status: Abnormal   Collection Time    12/31/12  7:40 PM      Result Value Range   Pro B Natriuretic peptide (BNP) 3133.0 (*) 0 - 450 pg/mL  POCT I-STAT TROPONIN I     Status: None   Collection Time    12/31/12  7:47 PM      Result Value Range   Troponin i, poc 0.07  0.00 - 0.08 ng/mL   Comment 3            Comment: Due to the release kinetics of cTnI,     a negative result within the first hours     of the onset of symptoms does not rule out     myocardial infarction with certainty.     If myocardial infarction is still suspected,     repeat the test at appropriate intervals.  POCT I-STAT, CHEM 8     Status: Abnormal   Collection Time    12/31/12  7:50 PM      Result Value Range   Sodium 123 (*) 135 - 145 mEq/L   Potassium 7.3 (*) 3.5 - 5.1 mEq/L   Chloride 85 (*) 96 - 112 mEq/L   BUN 131 (*) 6 - 23 mg/dL   Creatinine, Ser 3.08 (*) 0.50 - 1.10 mg/dL   Glucose, Bld 657 (*) 70 - 99 mg/dL   Calcium, Ion 8.46 (*) 1.13 - 1.30 mmol/L   TCO2 34  0 - 100 mmol/L   Hemoglobin 17.3 (*) 12.0 - 15.0 g/dL   HCT 96.2 (*) 95.2 - 84.1 %   Comment NOTIFIED PHYSICIAN    CG4 I-STAT (LACTIC ACID)     Status: Abnormal   Collection Time  12/31/12  7:52 PM      Result Value Range   Lactic Acid, Venous 3.57 (*) 0.5 - 2.2 mmol/L    Ct Abdomen Pelvis Wo Contrast  12/31/2012  *RADIOLOGY REPORT*  Clinical Data: Abdominal pain  CT ABDOMEN AND PELVIS WITHOUT CONTRAST  Technique:  Multidetector CT imaging of the abdomen and pelvis was performed following the standard protocol without intravenous contrast.  Comparison: None.  Findings: Extensive portal venous gas is seen throughout the liver.  There is extensive pneumatosis involving the distal duodenum and proximal jejunum.  No obvious internal hernia can be identified. Duodenal diverticulum is  noted  Markedly dilated small bowel loops are seen leading to the pelvis. See image 72.  There is a focal ball of stool with decompressed small bowel distally.  Colon is also completely decompressed. Little if any colonic gas.  This is compatible with a high-grade distal small bowel obstruction.  Extensive atherosclerotic vascular calcifications in the aorta and visceral vasculature.  No free intraperitoneal gas.  No abscess.  Marked distention of the stomach.  There is dense consolidation at the base of the lingula and left lower lobe with bronchiectasis.  Similar changes but to a lesser degree in the dependent portion of the right lower lobe.  Kidneys are atrophic.  Large benign appearing cystic lesion emanating from the lower pole of the left kidney.  Spleen is unremarkable.  Pancreas is atrophic.  Unremarkable gallbladder.  Degenerative changes in the lumbar spine without vertebral compression deformity.  IMPRESSION: There is pneumatosis involving the distal duodenum and proximal jejunum worrisome for ischemia.  Extensive portal venous gas in the liver is associated.  High-grade distal small bowel obstruction.  Extensive bibasilar consolidation left greater than right.  There is also bronchiectasis.  Critical Value/emergent results were called by telephone at the time of interpretation on 12/31/2012 at 2030 hours to Dr. Rhunette Croft, who verbally acknowledged these results.   Original Report Authenticated By: Jolaine Click, M.D.    Dg Chest Port 1 View  12/31/2012  *RADIOLOGY REPORT*  Clinical Data: Shortness of breath  PORTABLE CHEST - 1 VIEW  Comparison: 11/01/2010  Findings: 1900 hours.  Low volume film with elevation of the left hemidiaphragm and retrocardiac collapse / consolidation.  There is some minimal probable atelectasis at the right base. Telemetry leads overlie the chest.  IMPRESSION: Low volume film with left base collapse / consolidation.   Original Report Authenticated By: Kennith Center, M.D.      All other review of systems negative or noncontributory except as stated in the HPI   Blood pressure 118/58, pulse 103, temperature 97.4 F (36.3 C), temperature source Oral, resp. rate 26, SpO2 93.00%. General appearance: alert, cooperative and no distress Resp: breathing comfortably on nonrebreather but sats still in 89-91%range Cardio: tachycardic, regular GI: soft, nontender to my exam, moderate distension, no peritoneal signs, no hernia Extremities: extremities normal, atraumatic, no cyanosis or edema Pulses: 2+ and symmetric Neurologic: Grossly normal  Assessment/Plan: Bowel obstruction with pneumatosis of the bowel and dehydration. This is a difficult situation. She comes in extremely dehydrated with a potassium 7, a hemoglobin of 17 and a creatinine of 2.9. She also has abdominal distention and concerns for a high-grade bowel obstruction with pneumatosis and portal venous gas concerning for an intra-abdominal catastrophe. A long discussion with the patient and her family at the bedside and explained the concerning findings. I think that it would be hard to ignore her CT scan findings especially given her age, history of blood  clots, and her abdominal distention. Interestingly enough, she is essentially nontender on exam currently. I have reviewed her story and her images with Dr. Lindie Spruce and Dr. Derrell Lolling as well as and we feel that she will likely need exploratory surgery to rule out an intra-abdominal catastrophe, however, I would first recommend central line, Foley catheter, and aggressive hydration in the intensive care unit with repeat labs in another 2 hours. When her potassium is corrected and she begins to make urine, then we will plan on exploratory surgery. We'll plan for exploratory laparotomy, lysis of adhesions, possible bowel resection. This will be a high risk surgery for both morbidity and mortality especially given her current pulmonary status and likely pneumonia in will  possibly require prolonged ventilation. They expressed understanding that this may be a negative laparotomy as well.   [name] 12/31/2012, 10:57 PM

## 2012-12-31 NOTE — ED Notes (Signed)
Pt in from [facility], Dx with gastroenteritis Friday. Multiple episodes n/v Fri, Sat. Phenergan has helped since then. Weakness since then. Has been having to use wheelchair due to weakness, normally ambulatory living by self home. PCP reported orthostatic vitals, 72/50. Lowest ems got sitting was 90/60.

## 2013-01-01 ENCOUNTER — Encounter (HOSPITAL_COMMUNITY)
Admission: EM | Disposition: A | Discharge status: Skilled Nursing Facility | Payer: Self-pay | Source: Home / Self Care | Attending: Pulmonary Disease

## 2013-01-01 ENCOUNTER — Inpatient Hospital Stay (HOSPITAL_COMMUNITY): Payer: Medicare Other | Admitting: Anesthesiology

## 2013-01-01 ENCOUNTER — Encounter (HOSPITAL_COMMUNITY): Payer: Self-pay | Admitting: Anesthesiology

## 2013-01-01 ENCOUNTER — Inpatient Hospital Stay (HOSPITAL_COMMUNITY): Payer: Medicare Other

## 2013-01-01 ENCOUNTER — Encounter (HOSPITAL_COMMUNITY): Payer: Self-pay | Admitting: *Deleted

## 2013-01-01 DIAGNOSIS — T183XXA Foreign body in small intestine, initial encounter: Secondary | ICD-10-CM

## 2013-01-01 DIAGNOSIS — K56609 Unspecified intestinal obstruction, unspecified as to partial versus complete obstruction: Secondary | ICD-10-CM

## 2013-01-01 DIAGNOSIS — T184XXA Foreign body in colon, initial encounter: Secondary | ICD-10-CM

## 2013-01-01 DIAGNOSIS — J690 Pneumonitis due to inhalation of food and vomit: Secondary | ICD-10-CM

## 2013-01-01 HISTORY — PX: LAPAROTOMY: SHX154

## 2013-01-01 LAB — BLOOD GAS, ARTERIAL
Acid-base deficit: 1.8 mmol/L (ref 0.0–2.0)
Acid-base deficit: 4.6 mmol/L — ABNORMAL HIGH (ref 0.0–2.0)
Bicarbonate: 22.5 mEq/L (ref 20.0–24.0)
Drawn by: 317871
FIO2: 1 %
FIO2: 1 %
MECHVT: 370 mL
O2 Saturation: 95.6 %
O2 Saturation: 97.7 %
PEEP: 5 cmH2O
PEEP: 12 cmH2O
PEEP: 10 cmH2O
Patient temperature: 99.3
RATE: 22 resp/min
RATE: 32 resp/min
pCO2 arterial: 38.3 mmHg (ref 35.0–45.0)
pH, Arterial: 7.341 — ABNORMAL LOW (ref 7.350–7.450)
pH, Arterial: 7.297 — ABNORMAL LOW (ref 7.350–7.450)
pO2, Arterial: 86.8 mmHg (ref 80.0–100.0)

## 2013-01-01 LAB — GLUCOSE, CAPILLARY
Glucose-Capillary: 162 mg/dL — ABNORMAL HIGH (ref 70–99)
Glucose-Capillary: 98 mg/dL (ref 70–99)
Glucose-Capillary: 114 mg/dL — ABNORMAL HIGH (ref 70–99)

## 2013-01-01 LAB — BASIC METABOLIC PANEL
BUN: 81 mg/dL — ABNORMAL HIGH (ref 6–23)
BUN: 78 mg/dL — ABNORMAL HIGH (ref 6–23)
Calcium: 7.4 mg/dL — ABNORMAL LOW (ref 8.4–10.5)
Creatinine, Ser: 2.65 mg/dL — ABNORMAL HIGH (ref 0.50–1.10)
Creatinine, Ser: 2.41 mg/dL — ABNORMAL HIGH (ref 0.50–1.10)
GFR calc Af Amer: 18 mL/min — ABNORMAL LOW (ref >90)
GFR calc non Af Amer: 16 mL/min — ABNORMAL LOW (ref >90)
GFR calc non Af Amer: 18 mL/min — ABNORMAL LOW (ref >90)
Glucose, Bld: 159 mg/dL — ABNORMAL HIGH (ref 70–99)
Glucose, Bld: 135 mg/dL — ABNORMAL HIGH (ref 70–99)
Sodium: 127 mEq/L — ABNORMAL LOW (ref 135–145)

## 2013-01-01 LAB — TYPE AND SCREEN
ABO/RH(D): O POS
Antibody Screen: NEGATIVE

## 2013-01-01 LAB — LACTIC ACID, PLASMA
Lactic Acid, Venous: 2 mmol/L (ref 0.5–2.2)
Lactic Acid, Venous: 1.4 mmol/L (ref 0.5–2.2)

## 2013-01-01 LAB — CBC
HCT: 39.6 % (ref 36.0–46.0)
Hemoglobin: 14.2 g/dL (ref 12.0–15.0)
MCH: 28.7 pg (ref 26.0–34.0)
MCHC: 33.6 g/dL (ref 30.0–36.0)
MCV: 85.3 fL (ref 78.0–100.0)

## 2013-01-01 SURGERY — LAPAROTOMY, EXPLORATORY
Anesthesia: General | Wound class: Contaminated

## 2013-01-01 MED ORDER — 0.9 % SODIUM CHLORIDE (POUR BTL) OPTIME
TOPICAL | Status: DC | PRN
  Administered 2013-01-01 (×2): 2000 mL

## 2013-01-01 MED ORDER — PROMETHAZINE HCL 25 MG/ML IJ SOLN: 6.2500 mg | INTRAMUSCULAR | Status: DC | PRN

## 2013-01-01 MED ORDER — SODIUM CHLORIDE 0.9 % IV BOLUS (SEPSIS)
750.0000 mL | Freq: Once | INTRAVENOUS | Status: AC
  Administered 2013-01-01: 750 mL via INTRAVENOUS

## 2013-01-01 MED ORDER — AMIODARONE HCL IN DEXTROSE 360-4.14 MG/200ML-% IV SOLN
30.0000 mg/h | INTRAVENOUS | Status: DC
  Administered 2013-01-02 – 2013-01-05 (×8): 30 mg/h via INTRAVENOUS
  Filled 2013-01-01 (×8): qty 200

## 2013-01-01 MED ORDER — ROCURONIUM BROMIDE 100 MG/10ML IV SOLN
INTRAVENOUS | Status: DC | PRN
  Administered 2013-01-01: 20 mg via INTRAVENOUS
  Administered 2013-01-01: 30 mg via INTRAVENOUS

## 2013-01-01 MED ORDER — PHENYLEPHRINE HCL 10 MG/ML IJ SOLN
INTRAMUSCULAR | Status: DC | PRN
  Administered 2013-01-01: 80 ug via INTRAVENOUS
  Administered 2013-01-01: 40 ug via INTRAVENOUS
  Administered 2013-01-01: 80 ug via INTRAVENOUS

## 2013-01-01 MED ORDER — LIDOCAINE HCL (CARDIAC) 20 MG/ML IV SOLN
INTRAVENOUS | Status: DC | PRN
  Administered 2013-01-01: 50 mg via INTRAVENOUS

## 2013-01-01 MED ORDER — SODIUM CHLORIDE 0.9 % IV BOLUS (SEPSIS)
250.0000 mL | Freq: Once | INTRAVENOUS | Status: AC
  Administered 2013-01-01: 250 mL via INTRAVENOUS

## 2013-01-01 MED ORDER — CHLORHEXIDINE GLUCONATE 0.12 % MT SOLN
15.0000 mL | Freq: Two times a day (BID) | OROMUCOSAL | Status: DC
  Administered 2013-01-01 – 2013-01-08 (×15): 15 mL via OROMUCOSAL
  Filled 2013-01-01 (×14): qty 15

## 2013-01-01 MED ORDER — PIPERACILLIN-TAZOBACTAM IN DEX 2-0.25 GM/50ML IV SOLN
2.2500 g | Freq: Four times a day (QID) | INTRAVENOUS | Status: DC
  Administered 2013-01-01 – 2013-01-06 (×21): 2.25 g via INTRAVENOUS
  Filled 2013-01-01 (×23): qty 50

## 2013-01-01 MED ORDER — FENTANYL CITRATE 0.05 MG/ML IJ SOLN
100.0000 ug | Freq: Once | INTRAMUSCULAR | Status: AC
  Administered 2013-01-01: 100 ug via INTRAVENOUS

## 2013-01-01 MED ORDER — FENTANYL CITRATE 0.05 MG/ML IJ SOLN
INTRAMUSCULAR | Status: DC | PRN
  Administered 2013-01-01 (×2): 50 ug via INTRAVENOUS

## 2013-01-01 MED ORDER — SODIUM CHLORIDE 0.9 % IV SOLN
1.0000 mg/h | INTRAVENOUS | Status: DC
  Administered 2013-01-01: 2 mg/h via INTRAVENOUS
  Administered 2013-01-02: 1 mg/h via INTRAVENOUS
  Administered 2013-01-03: 2 mg/h via INTRAVENOUS
  Filled 2013-01-01 (×3): qty 10

## 2013-01-01 MED ORDER — HEPARIN SODIUM (PORCINE) 5000 UNIT/ML IJ SOLN
5000.0000 [IU] | Freq: Three times a day (TID) | INTRAMUSCULAR | Status: DC
  Administered 2013-01-01 – 2013-01-28 (×77): 5000 [IU] via SUBCUTANEOUS
  Filled 2013-01-01 (×84): qty 1

## 2013-01-01 MED ORDER — AMIODARONE HCL IN DEXTROSE 360-4.14 MG/200ML-% IV SOLN
60.0000 mg/h | INTRAVENOUS | Status: AC
  Administered 2013-01-01: 60 mg/h via INTRAVENOUS
  Filled 2013-01-01: qty 200

## 2013-01-01 MED ORDER — PHENYLEPHRINE HCL 10 MG/ML IJ SOLN
30.0000 ug/min | INTRAMUSCULAR | Status: DC
  Administered 2013-01-01: 30 ug/min via INTRAVENOUS
  Filled 2013-01-01 (×3): qty 1

## 2013-01-01 MED ORDER — SUCCINYLCHOLINE CHLORIDE 20 MG/ML IJ SOLN
INTRAMUSCULAR | Status: DC | PRN
  Administered 2013-01-01: 100 mg via INTRAVENOUS

## 2013-01-01 MED ORDER — PHENYLEPHRINE HCL 10 MG/ML IJ SOLN
10.0000 mg | INTRAVENOUS | Status: DC | PRN
  Administered 2013-01-01: 25 ug/min via INTRAVENOUS

## 2013-01-01 MED ORDER — LACTATED RINGERS IV SOLN
INTRAVENOUS | Status: DC | PRN
  Administered 2013-01-01 (×2): via INTRAVENOUS

## 2013-01-01 MED ORDER — LACTATED RINGERS IV SOLN: INTRAVENOUS | Status: DC

## 2013-01-01 MED ORDER — FENTANYL CITRATE 0.05 MG/ML IJ SOLN
25.0000 ug | INTRAMUSCULAR | Status: DC | PRN
  Administered 2013-01-01: 50 ug via INTRAVENOUS

## 2013-01-01 MED ORDER — FENTANYL BOLUS VIA INFUSION
25.0000 ug | Freq: Four times a day (QID) | INTRAVENOUS | Status: DC | PRN
  Administered 2013-01-01: 50 ug via INTRAVENOUS
  Filled 2013-01-01: qty 100

## 2013-01-01 MED ORDER — FENTANYL CITRATE 0.05 MG/ML IJ SOLN
25.0000 ug | INTRAMUSCULAR | Status: DC | PRN
  Filled 2013-01-01 (×2): qty 2

## 2013-01-01 MED ORDER — PROPOFOL 10 MG/ML IV BOLUS
INTRAVENOUS | Status: DC | PRN
  Administered 2013-01-01: 120 mg via INTRAVENOUS

## 2013-01-01 MED ORDER — AMIODARONE HCL IN DEXTROSE 360-4.14 MG/200ML-% IV SOLN: 60.0000 mg/h | INTRAVENOUS | Status: DC

## 2013-01-01 MED ORDER — MIDAZOLAM HCL 5 MG/5ML IJ SOLN
INTRAMUSCULAR | Status: DC | PRN
  Administered 2013-01-01: 2 mg via INTRAVENOUS

## 2013-01-01 MED ORDER — MIDAZOLAM HCL 2 MG/2ML IJ SOLN
2.0000 mg | Freq: Once | INTRAMUSCULAR | Status: AC
  Administered 2013-01-01: 2 mg via INTRAVENOUS

## 2013-01-01 MED ORDER — SODIUM CHLORIDE 0.9 % IV SOLN
25.0000 ug/h | INTRAVENOUS | Status: DC
  Administered 2013-01-01: 50 ug/h via INTRAVENOUS
  Filled 2013-01-01 (×2): qty 50

## 2013-01-01 MED ORDER — CHLORHEXIDINE GLUCONATE 0.12 % MT SOLN
OROMUCOSAL | Status: AC
  Filled 2013-01-01: qty 15

## 2013-01-01 MED ORDER — BIOTENE DRY MOUTH MT LIQD
15.0000 mL | Freq: Four times a day (QID) | OROMUCOSAL | Status: DC
  Administered 2013-01-01 – 2013-01-08 (×30): 15 mL via OROMUCOSAL

## 2013-01-01 MED ORDER — MIDAZOLAM BOLUS VIA INFUSION
1.0000 mg | INTRAVENOUS | Status: DC | PRN
  Administered 2013-01-01: 2 mg via INTRAVENOUS
  Filled 2013-01-01: qty 2

## 2013-01-01 MED ORDER — MIDAZOLAM HCL 2 MG/2ML IJ SOLN
1.0000 mg | INTRAMUSCULAR | Status: DC | PRN
  Administered 2013-01-01: 2 mg via INTRAVENOUS
  Filled 2013-01-01 (×2): qty 2

## 2013-01-01 MED ORDER — SODIUM CHLORIDE 0.9 % IV BOLUS (SEPSIS)
1000.0000 mL | Freq: Once | INTRAVENOUS | Status: AC
  Administered 2013-01-01: 1000 mL via INTRAVENOUS

## 2013-01-01 SURGICAL SUPPLY — 43 items
APPLICATOR COTTON TIP 6IN STRL (MISCELLANEOUS) ×4 IMPLANT
BLADE EXTENDED COATED 6.5IN (ELECTRODE) IMPLANT
BLADE HEX COATED 2.75 (ELECTRODE) ×2 IMPLANT
CANISTER SUCTION 2500CC (MISCELLANEOUS) ×2 IMPLANT
CHLORAPREP W/TINT 26ML (MISCELLANEOUS) ×2 IMPLANT
CLOTH BEACON ORANGE TIMEOUT ST (SAFETY) ×2 IMPLANT
COVER MAYO STAND STRL (DRAPES) ×4 IMPLANT
DRAPE LAPAROSCOPIC ABDOMINAL (DRAPES) ×2 IMPLANT
DRAPE UTILITY 15X26 (DRAPE) ×4 IMPLANT
DRAPE WARM FLUID 44X44 (DRAPE) ×2 IMPLANT
DRSG OPSITE POSTOP 4X8 (GAUZE/BANDAGES/DRESSINGS) ×2 IMPLANT
ELECT REM PT RETURN 9FT ADLT (ELECTROSURGICAL) ×2
ELECTRODE REM PT RTRN 9FT ADLT (ELECTROSURGICAL) ×1 IMPLANT
GLOVE BIO SURGEON STRL SZ7 (GLOVE) ×2 IMPLANT
GLOVE BIOGEL PI IND STRL 7.0 (GLOVE) ×1 IMPLANT
GLOVE BIOGEL PI INDICATOR 7.0 (GLOVE) ×1
GLOVE SURG SS PI 7.5 STRL IVOR (GLOVE) ×8 IMPLANT
GOWN PREVENTION PLUS LG XLONG (DISPOSABLE) IMPLANT
GOWN STRL NON-REIN LRG LVL3 (GOWN DISPOSABLE) IMPLANT
GOWN STRL REIN XL XLG (GOWN DISPOSABLE) ×6 IMPLANT
KIT BASIN OR (CUSTOM PROCEDURE TRAY) ×2 IMPLANT
LIGASURE IMPACT 36 18CM CVD LR (INSTRUMENTS) IMPLANT
NS IRRIG 1000ML POUR BTL (IV SOLUTION) ×4 IMPLANT
PACK GENERAL/GYN (CUSTOM PROCEDURE TRAY) ×2 IMPLANT
SCALPEL HARMONIC ACE (MISCELLANEOUS) IMPLANT
SHEARS FOC LG CVD HARMONIC 17C (MISCELLANEOUS) IMPLANT
SPONGE GAUZE 4X4 12PLY (GAUZE/BANDAGES/DRESSINGS) ×2 IMPLANT
SPONGE LAP 18X18 X RAY DECT (DISPOSABLE) IMPLANT
STAPLER VISISTAT 35W (STAPLE) ×2 IMPLANT
SUCTION POOLE TIP (SUCTIONS) ×2 IMPLANT
SUT PDS AB 0 CTX 60 (SUTURE) ×4 IMPLANT
SUT PDS AB 1 CTX 36 (SUTURE) IMPLANT
SUT PDS AB 1 TP1 96 (SUTURE) IMPLANT
SUT SILK 2 0 (SUTURE) ×1
SUT SILK 2 0 SH CR/8 (SUTURE) IMPLANT
SUT SILK 2-0 18XBRD TIE 12 (SUTURE) ×1 IMPLANT
SUT SILK 3 0 (SUTURE)
SUT SILK 3 0 SH CR/8 (SUTURE) IMPLANT
SUT SILK 3-0 18XBRD TIE 12 (SUTURE) IMPLANT
SUT VIC AB 3-0 SH 8-18 (SUTURE) ×4 IMPLANT
TOWEL OR 17X26 10 PK STRL BLUE (TOWEL DISPOSABLE) ×4 IMPLANT
TRAY FOLEY CATH 14FRSI W/METER (CATHETERS) IMPLANT
YANKAUER SUCT BULB TIP NO VENT (SUCTIONS) ×2 IMPLANT

## 2013-01-01 NOTE — Progress Notes (Signed)
CARE MANAGEMENT NOTE 01/01/2013  Patient:  COURTENY, EGLER   Account Number:  [id number]  Date Initiated:  01/01/2013  Documentation initiated by:  DAVIS,RHONDA  Subjective/Objective Assessment:   pt with post op resp failure on vent s/p colon resection due to sbo     Action/Plan:   from home   Anticipated DC Date:  01/04/2013   Anticipated DC Plan:  HOME/SELF CARE  In-house referral  NA      DC Planning Services  NA      [facility] Choice  NA   Choice offered to / List presented to:  NA   DME arranged  NA      DME agency  NA     HH arranged  NA      HH agency  NA   Status of service:  In process, will continue to follow Medicare Important Message given?  NA - LOS <3 / Initial given by admissions (If response is "NO", the following Medicare IM given date fields will be blank) Date Medicare IM given:   Date Additional Medicare IM given:    Discharge Disposition:    Per UR Regulation:  Reviewed for med. necessity/level of care/duration of stay  If discussed at Long Length of Stay Meetings, dates discussed:    Comments:  [id number] Earlene Plater, RN, BSN, CCM:  CHART REVIEWED AND UPDATED.  Next chart review due on [id number]. NO DISCHARGE NEEDS PRESENT AT THIS TIME. CASE MANAGEMENT [phone]

## 2013-01-01 NOTE — Progress Notes (Signed)
Day of Surgery  Subjective: Awake on vent.  Objective: Vital signs in last 24 hours: Temp:  [97.4 F (36.3 C)-99.3 F (37.4 C)] 99 F (37.2 C) (04/01 0800) Pulse Rate:  [51-107] 107 (04/01 0800) Resp:  [19-26] 24 (04/01 0800) BP: (113-135)/(52-62) 113/52 mmHg (04/01 0800) SpO2:  [86 %-100 %] 100 % (04/01 0800) FiO2 (%):  [100 %] 100 % (04/01 0856) Weight:  [141 lb 1.5 oz (64 kg)] 141 lb 1.5 oz (64 kg) (04/01 0200) Last BM Date: 12/31/12 Tachy, BP down some, on Vent, urine output not bad, just got here from surgery.  Intake/Output from previous day: 03/31 0701 - 04/01 0700 In: 2375 [I.V.:1375; IV Piggyback:1000] Out: 3425 [Urine:425] Intake/Output this shift: Total I/O In: 125 [I.V.:125] Out: 125 [Urine:125]  General appearance: alert, no distress and on Vent Resp: clear to auscultation bilaterally and anterior GI: soft, minimal distension dressing is dry.  Lab Results:   Recent Labs  12/31/12 1940 12/31/12 1950 01/01/13 0350  WBC 9.8  --  7.6  HGB 16.5* 17.3* 14.2  HCT 48.0* 51.0* 39.6  PLT 221  --  158    BMET  Recent Labs  01/01/13 0100 01/01/13 0350  NA 125* 127*  K 4.0 3.6  CL 82* 88*  CO2 31 26  GLUCOSE 159* 135*  BUN 81* 78*  CREATININE 2.65* 2.41*  CALCIUM 7.9* 7.4*   PT/INR  Recent Labs  01/01/13 0100  LABPROT 16.0*  INR 1.31     Recent Labs Lab 12/31/12 1940  AST 26  ALT 14  ALKPHOS 69  BILITOT 1.6*  PROT 6.7  ALBUMIN 3.2*     Lipase     Component Value Date/Time   LIPASE 162* 12/31/2012 1940     Studies/Results: Ct Abdomen Pelvis Wo Contrast  12/31/2012  *RADIOLOGY REPORT*  Clinical Data: Abdominal pain  CT ABDOMEN AND PELVIS WITHOUT CONTRAST  Technique:  Multidetector CT imaging of the abdomen and pelvis was performed following the standard protocol without intravenous contrast.  Comparison: None.  Findings: Extensive portal venous gas is seen throughout the liver.  There is extensive pneumatosis involving the distal  duodenum and proximal jejunum.  No obvious internal hernia can be identified. Duodenal diverticulum is noted  Markedly dilated small bowel loops are seen leading to the pelvis. See image 72.  There is a focal ball of stool with decompressed small bowel distally.  Colon is also completely decompressed. Little if any colonic gas.  This is compatible with a high-grade distal small bowel obstruction.  Extensive atherosclerotic vascular calcifications in the aorta and visceral vasculature.  No free intraperitoneal gas.  No abscess.  Marked distention of the stomach.  There is dense consolidation at the base of the lingula and left lower lobe with bronchiectasis.  Similar changes but to a lesser degree in the dependent portion of the right lower lobe.  Kidneys are atrophic.  Large benign appearing cystic lesion emanating from the lower pole of the left kidney.  Spleen is unremarkable.  Pancreas is atrophic.  Unremarkable gallbladder.  Degenerative changes in the lumbar spine without vertebral compression deformity.  IMPRESSION: There is pneumatosis involving the distal duodenum and proximal jejunum worrisome for ischemia.  Extensive portal venous gas in the liver is associated.  High-grade distal small bowel obstruction.  Extensive bibasilar consolidation left greater than right.  There is also bronchiectasis.  Critical Value/emergent results were called by telephone at the time of interpretation on 12/31/2012 at 2030 hours to Dr. Rhunette Croft, who verbally  acknowledged these results.   Original Report Authenticated By: Jolaine Click, M.D.    Dg Chest Port 1 View  12/31/2012  *RADIOLOGY REPORT*  Clinical Data: Shortness of breath  PORTABLE CHEST - 1 VIEW  Comparison: 11/01/2010  Findings: 1900 hours.  Low volume film with elevation of the left hemidiaphragm and retrocardiac collapse / consolidation.  There is some minimal probable atelectasis at the right base. Telemetry leads overlie the chest.  IMPRESSION: Low volume film  with left base collapse / consolidation.   Original Report Authenticated By: Kennith Center, M.D.     Medications: . antiseptic oral rinse  15 mL Mouth Rinse QID  . chlorhexidine  15 mL Mouth Rinse BID  . chlorhexidine      . heparin subcutaneous  5,000 Units Subcutaneous Q8H  . insulin aspart  0-15 Units Subcutaneous Q4H  . pantoprazole (PROTONIX) IV  40 mg Intravenous Q24H  . piperacillin-tazobactam (ZOSYN)  IV  2.25 g Intravenous Q6H  . sodium chloride  1,000 mL Intravenous Once    Assessment/Plan Bowel obstruction with pneumatosis of the bowel and dehydration. EXPLORATORY LAPAROTOMY removal of smal bowel foreign body for SBO. 01/01/2013, Lodema Pilot, DO.     . SBO (small bowel obstruction)  . PNA (pneumonia)  . HTN (hypertension)  . Hypoxia  . Hyponatremia  . Dehydration  . Acute renal failure  . Hyperglycemia  . Acute respiratory failure   Plan:  I will give her some extra fluid for now.  Defer to CCM       LOS: 1 day    [name] 01/01/2013

## 2013-01-01 NOTE — Anesthesia Postprocedure Evaluation (Signed)
  Anesthesia Post-op Note  Patient: [name]  Procedure(s) Performed: Procedure(s) (LRB): EXPLORATORY LAPAROTOMY removal of smal bowel foreign body (N/A)  Patient Location: ICU  Anesthesia Type: General  Level of Consciousness: sedated   Airway and Oxygen Therapy: intubated  Post-op Pain: mild  Post-op Assessment: Post-op Vital signs reviewed, Patient's Cardiovascular Status Stable, Respiratory Function Stable, Patent Airway and No signs of Nausea or vomiting  Last Vitals:  Filed Vitals:   01/01/13 0500  BP: 128/62  Pulse: 52  Temp:   Resp: 21    Post-op Vital Signs: stable   Complications: No apparent anesthesia complications

## 2013-01-01 NOTE — Progress Notes (Addendum)
PULMONARY  / CRITICAL CARE MEDICINE  Name: [name] MRN: [id number] DOB: 24-Sep-1929    ADMISSION DATE:  12/31/2012 CONSULTATION DATE:  12/31/2012  REFERRING MD :  EDP PRIMARY SERVICE:  PCCM  CHIEF COMPLAINT:  Abdominal pain/ SBO  BRIEF PATIENT DESCRIPTION: 79 yo without significant medical problems who developed abdominal pain, nausea, and vomiting 3 days prior to presentation to [facility] ED.  Found to have high grade SBO with pneumatosis and portal venous gas. Exploratory Lap early am 01/01/13. Pt. with hypoxia with LLL airspace disease.  SIGNIFICANT EVENTS / STUDIES:  3/31  Presented with nausea, vomiting, abdominal pain 3/31  Abdominal CT >>> High grade distal SBO and evidence of bowel ischemia 01/01/13  Exploratory Lap  LINES / TUBES: NGT 3/31 >>> Foley 3/31 >>> ETT 01/01/13>>>  CULTURES: None  ANTIBIOTICS: Zosyn 3/31 >>>  The patient is intubated and unable to provide history.  HISTORY OF PRESENT ILLNESS:  79 yo without significant medical problems who developed abdominal pain, nausea, and vomiting 3 days prior to presentation to [facility] ED.  Found to have high grade SBO with pneumatosis and portal venous gas. Exploratory lap early am 01/01/13  Pt. with, hypoxia with LLL airspace disease pre-op.  PAST MEDICAL HISTORY :  Past Medical History  Diagnosis Date  . Osteoporosis   . DVT (deep venous thrombosis)   . Factor V Leiden   . Hypertension   . Dysphonia    Past Surgical History  Procedure Laterality Date  . Tonsillectomy    . Appendectomy    . Abdominal hysterectomy     Prior to Admission medications   Medication Sig Start Date End Date Taking? Authorizing Provider  amLODipine (NORVASC) 10 MG tablet Take 10 mg by mouth daily.   Yes Historical Provider, MD  aspirin EC 81 MG tablet Take 81 mg by mouth daily.   Yes Historical Provider, MD  fexofenadine (ALLEGRA) 180 MG tablet Take 180 mg by mouth daily.   Yes Historical Provider, MD  irbesartan (AVAPRO) 300 MG tablet Take  300 mg by mouth at bedtime.   Yes Historical Provider, MD  Multiple Vitamin (MULTIVITAMIN WITH MINERALS) TABS Take 1 tablet by mouth daily.   Yes Historical Provider, MD  rosuvastatin (CRESTOR) 5 MG tablet Take 5 mg by mouth daily.   Yes Historical Provider, MD   Allergies  Allergen Reactions  . Diovan (Valsartan)   . Hctz (Hydrochlorothiazide)   . Tekamlo (Aliskiren-Amlodipine)   . Valturna (Aliskiren-Valsartan)    FAMILY HISTORY:  History reviewed. No pertinent family history.  SOCIAL HISTORY:  reports that she has quit smoking. She does not have any smokeless tobacco history on file. She reports that  drinks alcohol. She reports that she does not use illicit drugs.  REVIEW OF SYSTEMS:  Unable to provide.  INTERVAL HISTORY:  VITAL SIGNS: Temp:  [97.4 F (36.3 C)-99.3 F (37.4 C)] 99.3 F (37.4 C) (04/01 0400) Pulse Rate:  [51-103] 52 (04/01 0500) Resp:  [19-26] 21 (04/01 0500) BP: (118-135)/(57-62) 128/62 mmHg (04/01 0500) SpO2:  [86 %-96 %] 91 % (04/01 0500) FiO2 (%):  [100 %] 100 % (04/01 0753) Weight:  [141 lb 1.5 oz (64 kg)] 141 lb 1.5 oz (64 kg) (04/01 0200) HEMODYNAMICS:   VENTILATOR SETTINGS: Vent Mode:  [-] PRVC FiO2 (%):  [100 %] 100 % Set Rate:  [14 bmp] 14 bmp Vt Set:  [450 mL] 450 mL PEEP:  [5 cmH20] 5 cmH20 Plateau Pressure:  [9 cmH20] 9 cmH20 INTAKE / OUTPUT:  Intake/Output     03/31 0701 - 04/01 0700 04/01 0701 - 04/02 0700   I.V. (mL/kg) 1250 (19.5)    IV Piggyback 1000    Total Intake(mL/kg) 2250 (35.2)    Urine (mL/kg/hr) 425    Other 3000    Total Output 3425     Net -1175           PHYSICAL EXAMINATION: General:  Intubated, awake, alert and following commands. Neuro:  MAEx4, following commands HEENT:  PERRL, NGT, dry mucous membranes Cardiovascular:  Tachycardic, ST with PAC's Lungs:  Bilaterally diminished L>R  Abdomen:  Distended, hypoactive BS, bilious drainage per NG. Musculoskeletal:  Moves all extremities, no edema noted Skin:   Intact  LABS:  Recent Labs Lab 12/31/12 1940 12/31/12 1950 12/31/12 1952 01/01/13 0100 01/01/13 0350  HGB 16.5* 17.3*  --   --  14.2  WBC 9.8  --   --   --  7.6  PLT 221  --   --   --  158  NA 125* 123*  --  125* 127*  K 3.8 7.3*  --  4.0 3.6  CL 78* 85*  --  82* 88*  CO2 28  --   --  31 26  GLUCOSE 175* 181*  --  159* 135*  BUN 79* 131*  --  81* 78*  CREATININE 2.49* 2.90*  --  2.65* 2.41*  CALCIUM 9.2  --   --  7.9* 7.4*  MG  --   --   --  1.9  --   PHOS  --   --   --  5.0*  --   AST 26  --   --   --   --   ALT 14  --   --   --   --   ALKPHOS 69  --   --   --   --   BILITOT 1.6*  --   --   --   --   PROT 6.7  --   --   --   --   ALBUMIN 3.2*  --   --   --   --   APTT  --   --   --  29  --   INR  --   --   --  1.31  --   LATICACIDVEN  --   --  3.57* 2.0 1.4  PROBNP 3133.0*  --   --   --   --     Recent Labs Lab 01/01/13 0215 01/01/13 0351 01/01/13 0752  GLUCAP 162* 146* 98    CXR:  3/31 >>> LLL airspace disease/ collapse. ALI pattern  ASSESSMENT / PLAN:  PULMONARY A:  Suspected aspiration pneumonia vs atelectasis.  Hypoxemic respiratory failure. Probable ALI/ARDS from aspiration likely P: Intubated for surgery 01/01/13 Gaol SpO2>92 ARDS Protocol per RT goal 6cc/kg = 315cc Follow ABG's May require bronch CXR  CARDIOVASCULAR A: SIRS / sepsis. P:  Goal MAP>60 No vasopressors required (4/1) Lactate trending down: 1.4( 4/1) Continue Zosyn( Good coverage for Aspiration) Hold Norvasc, ASA, Crestor, Avapro  RENAL A:  Acute renal failure in setting of intravascular depletion / ARF. Marland Kitchen P:   BUN and CREAT trending down. K+ 3.6 on 4/1: Replete as necessary NS@50 / hr Follow renal function  GASTROINTESTINAL A:  Distal SBO.  Suspected ischemic bowel. S/p ex lap with foreign body removal P:  Exploratory Lap 4/1 by CCS  Continue NPO per CCS NGT to Sx per CCS Protonix  for GI Px   HEMATOLOGIC A:  Hemoconcentration.  H/o DVT, factor V Leiden. P:   Received 3L NS in ED HGB 4/1: 14.2 Trend CBC Per CCS delay start of VTE agent >24h 2/2 surgical blood loss/ increased risk of bleed. SCDs 4/1  INFECTIOUS A:  Suspected aspiration pneumonia 2/2 SBO P: Continue Zosyn      Trend WBC's       Pan Culture   ENDOCRINE  A:  Hyperglycemia.  No h/o DM.       Hyponatremia P:   CBG's and SSI Serum glucose 135( 4/1) Continue to trend  Monitor serum sodium   NEUROLOGIC A:  No active issues. P:   No intervention required Sedation protocol  TODAY'S SUMMARY: 79 yo without significant medical problems who developed abdominal pain, nausea, and vomiting 3 days prior to presentation to [facility] ED.  Found to have high grade SBO with pneumatosis and portal venous gas.  Additionally, pt has  LLL airspace disease, hypoxia, as well as ARF.  3/31-4/1  fluid resuscitation,Exploratory lap per CCS early am 4/1. Returned to ICU intubated, alert with CXR indicating ALI pattern. Placed on ARDS protocol. Pending CVC placement 4/1.   I have personally obtained a history, examined the patient, evaluated laboratory and imaging results, formulated the assessment and plan and placed orders.  CRITICAL CARE:  The patient is critically ill with multiple organ systems failure and requires high complexity decision making for assessment and support, frequent evaluation and titration of therapies, application of advanced monitoring technologies and extensive interpretation of multiple databases. Critical Care Time devoted to patient care services described in this note is 40 minutes.   Dorcas Carrow Beeper  [phone]  Cell  [phone]  If no response or cell goes to voicemail, call beeper [phone]

## 2013-01-01 NOTE — Progress Notes (Signed)
Pt transported to OR Holding Area at 0515 via bed.  Prior to transport dentures were taken out and placed in denture cup and left by sink in room 1225, along with pt's glasses.  Pt was transported on X2 Monitor and Portable O2 remaining at 10L flow with NRB attached.  Brief report given to Dr. Okey Dupre in the holding area.  Asked Holding Area RN if she had any questions, no questions asked, advised both Dr. Okey Dupre and the RN that her son Greig Castilla was at her bedside.

## 2013-01-01 NOTE — Progress Notes (Signed)
INITIAL NUTRITION ASSESSMENT  DOCUMENTATION CODES Per approved criteria  -Not Applicable   INTERVENTION: If pt remains intubated >48 hours, recommend initiating tube feeds; recommend Initiating Oxepa @ 10 ml/hr via NG tube and increase by 10 ml every 12 hours to goal rate of 25 ml/hr. 30 ml Prostat QID.  At goal rate, tube feeding regimen will provide 1300 kcal, 98 grams of protein, and 471 ml of H2O.    NUTRITION DIAGNOSIS: Inadequate oral intake related to inability to eat as evidenced by pt NPO and on ventilator.   Goal: Pt to meet >/= 90% of their estimated nutrition needs  Monitor:  Vent status Wt Labs  Reason for Assessment: New Vent  79 y.o. female  Admitting Dx: SBO  ASSESSMENT: 79 yo without significant medical problems who developed abdominal pain, nausea, and vomiting 3 days prior to presentation to [facility] ED. Found to have high grade SBO with pneumatosis and portal venous gas. Pt had exploratory laparotomy with small bowel enterotomy and removal of foreign body today. The patient was left intubated due to the tenuous pulmonary status of the pneumonia and significant amounts of oxygen requirement preoperative. Per pt's sister-in-law in the room, pt is usually a good eater with a normal appetite but, Friday 3/28 pt starting having nausea and vomiting and she hasn't eat much of anything since. Pt's usual body weight is 140 lbs per sister-in-law.     Height: Ht Readings from Last 1 Encounters:  01/01/13 5\' 3"  (1.6 m)    Weight: Wt Readings from Last 1 Encounters:  01/01/13 141 lb 1.5 oz (64 kg)    Ideal Body Weight: 115 lbs  % Ideal Body Weight: 123%  Wt Readings from Last 10 Encounters:  01/01/13 141 lb 1.5 oz (64 kg)  01/01/13 141 lb 1.5 oz (64 kg)    Usual Body Weight: 140 lbs  % Usual Body Weight: 100%  BMI:  Body mass index is 25 kg/(m^2).   Patient is currently intubated on ventilator support.  MV: 9.0 Temp:Temp (24hrs), Avg:98.7 F (37.1 C),  Min:97.4 F (36.3 C), Max:99.3 F (37.4 C)   Estimated Nutritional Needs: Kcal: 1349 Protein: 96-128 grams Fluid: 1.9-2.2 L  Skin: abdominal incision  Diet Order: NPO  EDUCATION NEEDS: -No education needs identified at this time   Intake/Output Summary (Last 24 hours) at 01/01/13 1050 Last data filed at 01/01/13 1000  Gross per 24 hour  Intake   3000 ml  Output   3575 ml  Net   -575 ml    Last BM: 3/31  Labs:   Recent Labs Lab 12/31/12 1940 12/31/12 1950 01/01/13 0100 01/01/13 0350  NA 125* 123* 125* 127*  K 3.8 7.3* 4.0 3.6  CL 78* 85* 82* 88*  CO2 28  --  31 26  BUN 79* 131* 81* 78*  CREATININE 2.49* 2.90* 2.65* 2.41*  CALCIUM 9.2  --  7.9* 7.4*  MG  --   --  1.9  --   PHOS  --   --  5.0*  --   GLUCOSE 175* 181* 159* 135*    CBG (last 3)   Recent Labs  01/01/13 0215 01/01/13 0351 01/01/13 0752  GLUCAP 162* 146* 98    Scheduled Meds: . antiseptic oral rinse  15 mL Mouth Rinse QID  . chlorhexidine  15 mL Mouth Rinse BID  . chlorhexidine      . fentaNYL  100 mcg Intravenous Once  . heparin subcutaneous  5,000 Units Subcutaneous Q8H  .  insulin aspart  0-15 Units Subcutaneous Q4H  . midazolam  2 mg Intravenous Once  . pantoprazole (PROTONIX) IV  40 mg Intravenous Q24H  . piperacillin-tazobactam (ZOSYN)  IV  2.25 g Intravenous Q6H  . sodium chloride  1,000 mL Intravenous Once  . sodium chloride  250 mL Intravenous Once    Continuous Infusions: . sodium chloride 125 mL/hr at 01/01/13 0808  . fentaNYL infusion INTRAVENOUS    . lactated ringers    . midazolam (VERSED) infusion      Past Medical History  Diagnosis Date  . Osteoporosis   . DVT (deep venous thrombosis)   . Factor V Leiden   . Hypertension   . Dysphonia     Past Surgical History  Procedure Laterality Date  . Tonsillectomy    . Appendectomy    . Abdominal hysterectomy      Ian Malkin RD, LDN Inpatient Clinical Dietitian Pager: [phone] After Hours Pager:  [phone]

## 2013-01-01 NOTE — Procedures (Signed)
Central Venous Catheter Insertion Procedure Note [name] [id number] [birth year]  Procedure: Insertion of Central Venous Catheter Indications: Assessment of intravascular volume, Drug and/or fluid administration and Frequent blood sampling  Procedure Details Consent: Risks of procedure as well as the alternatives and risks of each were explained to the (patient/caregiver).  Consent for procedure obtained. Time Out: Verified patient identification, verified procedure, site/side was marked, verified correct patient position, special equipment/implants available, medications/allergies/relevent history reviewed, required imaging and test results available.  Performed  Maximum sterile technique was used including antiseptics, cap, gloves, gown, hand hygiene, mask and sheet. Skin prep: Chlorhexidine; local anesthetic administered A antimicrobial bonded/coated triple lumen catheter was placed in the left internal jugular vein using the Seldinger technique. Ultrasound guidance used.yes Catheter placed to 20 cm. Blood aspirated via all 3 ports and then flushed x 3. Line sutured x 2 and dressing applied.  Evaluation Blood flow good Complications: No apparent complications Patient did tolerate procedure well. Chest X-ray ordered to verify placement.  CXR: pending.  Brett Canales Minor ACNP Adolph Pollack PCCM Pager [phone] till 3 pm If no answer page [phone] 01/01/2013, 11:02 AM  I was present for this procedure Luisa Hart WrightMD

## 2013-01-01 NOTE — Progress Notes (Signed)
eLink Physician-Brief Progress Note Patient Name: JUNELLA DOMKE DOB: 05-Jul-1929 MRN: [id number]  Date of Service  01/01/2013   HPI/Events of Note   Low bp CVP 9 Poor UO  eICU Interventions  Fluid bolus 1L   Intervention Category Major Interventions: Hypotension - evaluation and management  ALVA,RAKESH V. 01/01/2013, 5:51 PM

## 2013-01-01 NOTE — Anesthesia Preprocedure Evaluation (Addendum)
Anesthesia Evaluation  Patient identified by MRN, date of birth, ID band Patient awake    Reviewed: Allergy & Precautions, H&P , NPO status , Patient's Chart, lab work & pertinent test results  Airway Mallampati: II TM Distance: >3 FB Neck ROM: Full    Dental no notable dental hx.    Pulmonary pneumonia -, unresolved,  LLL pneumonia breath sounds clear to auscultation  + decreased breath sounds      Cardiovascular hypertension, Pt. on medications DVT Rhythm:Regular Rate:Normal     Neuro/Psych negative neurological ROS  negative psych ROS   GI/Hepatic negative GI ROS, Portal venous gas   Endo/Other  negative endocrine ROS  Renal/GU Renal Insufficiency and ARFRenal disease  negative genitourinary   Musculoskeletal negative musculoskeletal ROS (+)   Abdominal   Peds negative pediatric ROS (+)  Hematology Factor v leiden   Anesthesia Other Findings   Reproductive/Obstetrics negative OB ROS                       Anesthesia Physical Anesthesia Plan  ASA: IV and emergent  Anesthesia Plan: General   Post-op Pain Management:    Induction: Intravenous, Rapid sequence and Cricoid pressure planned  Airway Management Planned: Oral ETT  Additional Equipment:   Intra-op Plan:   Post-operative Plan: Possible Post-op intubation/ventilation  Informed Consent: I have reviewed the patients History and Physical, chart, labs and discussed the procedure including the risks, benefits and alternatives for the proposed anesthesia with the patient or authorized representative who has indicated his/her understanding and acceptance.   Dental advisory given  Plan Discussed with: CRNA and Surgeon  Anesthesia Plan Comments:       Anesthesia Quick Evaluation

## 2013-01-01 NOTE — Progress Notes (Signed)
Patient interviewed and examined, agree with PA note above.  Mariella Saa MD, FACS  01/01/2013 3:55 PM

## 2013-01-01 NOTE — Plan of Care (Signed)
Problem: Phase I Progression Outcomes Goal: OOB as tolerated unless otherwise ordered Outcome: Not Progressing presently Bedrest, possible surgery later tonight from admitting MD progress notes Goal: Voiding-avoid urinary catheter unless indicated Outcome: Not Met (add Reason) Foley cath ordered for monitoring urine output

## 2013-01-01 NOTE — Op Note (Addendum)
[name], [name] NO.:  [id number]  MEDICAL RECORD NO.:  [id number]  LOCATION:  1225                         FACILITY:  [facility]  PHYSICIAN:  Lodema Pilot, MD       DATE OF BIRTH:  09/27/1929  DATE OF PROCEDURE:  01/01/2013 DATE OF DISCHARGE:                              OPERATIVE REPORT   PROCEDURE:  Exploratory laparotomy with small bowel enterotomy and removal of foreign body.  PREOPERATIVE DIAGNOSIS:  Bowel obstruction and pneumatosis.  POSTOPERATIVE DIAGNOSIS:  Bowel obstruction.  Surgeon: Lodema Pilot  Assistant: Claud Kelp  ANESTHESIA:  General endotracheal anesthesia.  FLUIDS:  1600 mL of crystalloid.  ESTIMATED BLOOD LOSS:  Minimal.  DRAINS:  None.  SPECIMENS:  Small bowel foreign body sent to Pathology for permanent section.  COMPLICATIONS:  None apparent.  FINDINGS:  Bowel appeared healthy.  There was no evidence of ischemia. She had a bowel obstruction at the level of the proximal ileum from a small bowel foreign body.  This was removed through a small bowel enterotomy with 2 layer closure, and no other obvious pathology was identified.  OPERATIVE DETAILS:  Ms. Mood was seen and evaluated in the emergency room and risks and benefits of the procedure were discussed in lay terms.  Informed consent was obtained.  She was initially taken to the intensive care unit for fluid resuscitation and eventually taken to the operating room and placed on the table in a supine position.  She was on therapeutic antibiotics for pneumonia, and general endotracheal anesthesia was obtained.  Her abdomen was prepped and draped in a standard surgical fashion and procedure time-out was performed with all operative team members to confirm proper patient and procedure.  A midline incision was made in the skin and dissection carried down to the abdominal wall fascia using Bovie electrocautery and the peritoneum was entered.  The incision was opened.   She had immediate return of some ascitic fluid, but no obvious bowel perforation.  She had very dilated stomach and dilated proximal small bowel.  NG tube was advanced into the stomach and fastened in place.  The stomach appeared healthy.  Duodenum appeared normal.  The small bowel was eviscerated, and no evidence of perforation, no evidence of ischemia.  The proximal ileum distal jejunum area she had an obvious transition point with very dilated proximal small bowel and normal decompressed small bowel distal to the palpable foreign body.  I ran the entire small bowel from the ileocecal valve to the ligament of Treitz and this was the only area of abnormality.  She did not have any adhesive disease despite her prior surgery.  I decided to extract this foreign body by making a small bowel enterotomy into a healthy appearing piece of small bowel.  A longitudinal incision was made on the mesenteric surface of the bowel and this object was removed through the enterotomy.  It was unclear still what this object was and it was sent to Pathology for permanent sectioning.  We placed a suction tip proximally  and suctioned out the excess fluid in small bowel. Enterotomy was closed transversely with several interrupted 3-0 Vicryl full-thickness sutures in an interrupted fashion.  A second layer of 3-0 silk Lembert suture was used to further close the small bowel defect and little bit proximally was palpable.  The abdomen was then irrigated with several liters of sterile saline solution and sterile irrigation returned clear.  The bowel was placed back in the abdomen, and the fascia was approximated with 0 loop PDS suture x2 taking care to avoid injury to underlying bowel contents.  The wound was irrigated with sterile saline solution and the skin was approximated with skin staples. Sterile dressing was applied and the patient was left intubated due to the tenuous pulmonary status of the pneumonia and  significant amounts of oxygen requirement preoperative.  She was hemodynamically stable and ready for transfer back to the intensive care unit in stable condition.          ______________________________ Lodema Pilot, MD     BL/MEDQ  D:  01/01/2013  T:  01/01/2013  Job:  [id number]

## 2013-01-01 NOTE — Brief Op Note (Signed)
12/31/2012 - 01/01/2013  7:26 AM  PATIENT:  [name]  79 y.o. female  PRE-OPERATIVE DIAGNOSIS:  small bowel obstruction  POST-OPERATIVE DIAGNOSIS:  small obstruction  PROCEDURE:  Procedure(s): EXPLORATORY LAPAROTOMY removal of smal bowel foreign body (N/A)  SURGEON:  Surgeon(s) and Role:    * Lodema Pilot, DO - Primary    * Ernestene Mention, MD - Assisting  PHYSICIAN ASSISTANT:   ASSISTANTSDerrell Lolling   ANESTHESIA:   general  EBL:     BLOOD ADMINISTERED:none  DRAINS: none   LOCAL MEDICATIONS USED:  NONE  SPECIMEN:  Source of Specimen:  small bowel foreign body  DISPOSITION OF SPECIMEN:  PATHOLOGY  COUNTS:  YES  TOURNIQUET:  * No tourniquets in log *  DICTATION: .Other Dictation: Dictation Number [id number]  PLAN OF CARE: Admit to inpatient   PATIENT DISPOSITION:  ICU - intubated and hemodynamically stable.   Delay start of Pharmacological VTE agent (>24hrs) due to surgical blood loss or risk of bleeding: no

## 2013-01-01 NOTE — Transfer of Care (Signed)
Immediate Anesthesia Transfer of Care Note  Patient: [name]  Procedure(s) Performed: Procedure(s): EXPLORATORY LAPAROTOMY removal of smal bowel foreign body (N/A)  Patient Location: ICU  Anesthesia Type:General  Level of Consciousness: sedated  Airway & Oxygen Therapy: Patient remains intubated per anesthesia plan  Post-op Assessment: Post -op Vital signs reviewed and stable  Post vital signs: Reviewed and stable  Complications: No apparent anesthesia complications

## 2013-01-01 NOTE — Progress Notes (Signed)
eLink Physician-Brief Progress Note Patient Name: [name] DOB: Feb 27, 1929 MRN: [id number]  Date of Service  01/01/2013   HPI/Events of Note    Appears AF-RVR hypotensive  eICU Interventions  Neo gtt  STart amio   Intervention Category Major Interventions: Arrhythmia - evaluation and management  ALVA,RAKESH V. 01/01/2013, 6:27 PM

## 2013-01-01 NOTE — Progress Notes (Signed)
ANTIBIOTIC CONSULT NOTE - INITIAL  Pharmacy Consult for Zosyn Indication: PNA/Ischemic Bowel  Allergies  Allergen Reactions  . Diovan (Valsartan)   . Hctz (Hydrochlorothiazide)   . Tekamlo (Aliskiren-Amlodipine)   . Valturna (Aliskiren-Valsartan)     Patient Measurements: Height: 5\' 3"  (160 cm) Weight: 141 lb 1.5 oz (64 kg) IBW/kg (Calculated) : 52.4   Vital Signs: Temp: 99.1 F (37.3 C) (04/01 0200) Temp src: Oral (04/01 0200) BP: 135/57 mmHg (04/01 0200) Pulse Rate: 103 (04/01 0200) Intake/Output from previous day: 03/31 0701 - 04/01 0700 In: 500 [IV Piggyback:500] Out: 3300 [Urine:300] Intake/Output from this shift: Total I/O In: 500 [IV Piggyback:500] Out: 3300 [Urine:300; Other:3000]  Labs:  Recent Labs  12/31/12 1940 12/31/12 1950 01/01/13 0100  WBC 9.8  --   --   HGB 16.5* 17.3*  --   PLT 221  --   --   CREATININE 2.49* 2.90* 2.65*   Estimated Creatinine Clearance: 15 ml/min (by C-G formula based on Cr of 2.65). No results found for this basename: VANCOTROUGH, VANCOPEAK, VANCORANDOM, GENTTROUGH, GENTPEAK, GENTRANDOM, TOBRATROUGH, TOBRAPEAK, TOBRARND, AMIKACINPEAK, AMIKACINTROU, AMIKACIN,  in the last 72 hours   Microbiology: No results found for this or any previous visit (from the past 720 hour(s)).  Medical History: Past Medical History  Diagnosis Date  . Osteoporosis   . DVT (deep venous thrombosis)   . Factor V Leiden   . Hypertension   . Dysphonia     Medications:  Scheduled:  . insulin aspart  0-15 Units Subcutaneous Q4H  . pantoprazole (PROTONIX) IV  40 mg Intravenous Q24H  . piperacillin-tazobactam (ZOSYN)  IV  2.25 g Intravenous Q6H  . [COMPLETED] piperacillin-tazobactam  3.375 g Intravenous Once  . sodium chloride  1,000 mL Intravenous Once  . [COMPLETED] sodium chloride  1,000 mL Intravenous Once  . sodium chloride  1,000 mL Intravenous Once  . [DISCONTINUED] metronidazole  500 mg Intravenous Once   Infusions:  . sodium  chloride     Assessment: 79 yo admitted with abdominal pain. MD treating suspected aspiration PNA and suspected ischemic bowel with Zosyn per Rx.  Goal of Therapy:  Treat infection   Plan:   Zosyn 2.25 Gm IV q6h. CrCl ~15 ml/min  F/U SCr, may need to adjust dose if SCr improves.  Susanne Greenhouse R 01/01/2013,2:22 AM

## 2013-01-02 ENCOUNTER — Inpatient Hospital Stay (HOSPITAL_COMMUNITY): Payer: Medicare Other

## 2013-01-02 ENCOUNTER — Encounter (HOSPITAL_COMMUNITY): Payer: Self-pay | Admitting: General Surgery

## 2013-01-02 DIAGNOSIS — A419 Sepsis, unspecified organism: Secondary | ICD-10-CM | POA: Diagnosis not present

## 2013-01-02 DIAGNOSIS — R571 Hypovolemic shock: Secondary | ICD-10-CM | POA: Diagnosis not present

## 2013-01-02 DIAGNOSIS — D62 Acute posthemorrhagic anemia: Secondary | ICD-10-CM | POA: Diagnosis not present

## 2013-01-02 LAB — BLOOD GAS, ARTERIAL
Acid-base deficit: 4.8 mmol/L — ABNORMAL HIGH (ref 0.0–2.0)
Bicarbonate: 17.2 mEq/L — ABNORMAL LOW (ref 20.0–24.0)
Bicarbonate: 19.7 mEq/L — ABNORMAL LOW (ref 20.0–24.0)
Drawn by: 295031
FIO2: 0.5 %
O2 Saturation: 98.5 %
PEEP: 10 cmH2O
PEEP: 5 cmH2O
Patient temperature: 99.8
RATE: 28 resp/min
TCO2: 15.6 mmol/L (ref 0–100)
TCO2: 17.9 mmol/L (ref 0–100)
pCO2 arterial: 37.1 mmHg (ref 35.0–45.0)
pCO2 arterial: 37.5 mmHg (ref 35.0–45.0)
pH, Arterial: 7.288 — ABNORMAL LOW (ref 7.350–7.450)
pH, Arterial: 7.346 — ABNORMAL LOW (ref 7.350–7.450)
pH, Arterial: 7.429 (ref 7.350–7.450)
pO2, Arterial: 116 mmHg — ABNORMAL HIGH (ref 80.0–100.0)
pO2, Arterial: 124 mmHg — ABNORMAL HIGH (ref 80.0–100.0)
pO2, Arterial: 146 mmHg — ABNORMAL HIGH (ref 80.0–100.0)

## 2013-01-02 LAB — POCT I-STAT, CHEM 8
BUN: 131 mg/dL — ABNORMAL HIGH (ref 6–23)
Chloride: 85 mEq/L — ABNORMAL LOW (ref 96–112)
Creatinine, Ser: 2.9 mg/dL — ABNORMAL HIGH (ref 0.50–1.10)
Potassium: 7.3 mEq/L (ref 3.5–5.1)
Sodium: 123 mEq/L — ABNORMAL LOW (ref 135–145)

## 2013-01-02 LAB — BASIC METABOLIC PANEL
CO2: 23 mEq/L (ref 19–32)
Calcium: 6.4 mg/dL — CL (ref 8.4–10.5)
Creatinine, Ser: 3.04 mg/dL — ABNORMAL HIGH (ref 0.50–1.10)
GFR calc Af Amer: 16 mL/min — ABNORMAL LOW (ref >90)
Sodium: 129 mEq/L — ABNORMAL LOW (ref 135–145)

## 2013-01-02 LAB — GLUCOSE, CAPILLARY
Glucose-Capillary: 110 mg/dL — ABNORMAL HIGH (ref 70–99)
Glucose-Capillary: 111 mg/dL — ABNORMAL HIGH (ref 70–99)
Glucose-Capillary: 115 mg/dL — ABNORMAL HIGH (ref 70–99)
Glucose-Capillary: 131 mg/dL — ABNORMAL HIGH (ref 70–99)
Glucose-Capillary: 174 mg/dL — ABNORMAL HIGH (ref 70–99)

## 2013-01-02 LAB — CBC
HCT: 35.7 % — ABNORMAL LOW (ref 36.0–46.0)
Hemoglobin: 12.3 g/dL (ref 12.0–15.0)
RDW: 15 % (ref 11.5–15.5)
WBC: 11.7 10*3/uL — ABNORMAL HIGH (ref 4.0–10.5)

## 2013-01-02 LAB — LACTIC ACID, PLASMA
Lactic Acid, Venous: 1.1 mmol/L (ref 0.5–2.2)
Lactic Acid, Venous: 1.1 mmol/L (ref 0.5–2.2)

## 2013-01-02 LAB — TROPONIN I: Troponin I: <0.3 ng/mL (ref <0.30)

## 2013-01-02 LAB — PHOSPHORUS: Phosphorus: 5.4 mg/dL — ABNORMAL HIGH (ref 2.3–4.6)

## 2013-01-02 LAB — MAGNESIUM: Magnesium: 1.7 mg/dL (ref 1.5–2.5)

## 2013-01-02 MED ORDER — PHENYLEPHRINE HCL 10 MG/ML IJ SOLN
30.0000 ug/min | INTRAVENOUS | Status: DC
  Administered 2013-01-02: 65 ug/min via INTRAVENOUS
  Administered 2013-01-02: 75 ug/min via INTRAVENOUS
  Administered 2013-01-02: 60 ug/min via INTRAVENOUS
  Administered 2013-01-03: 35 ug/min via INTRAVENOUS
  Filled 2013-01-02 (×4): qty 4

## 2013-01-02 MED ORDER — SODIUM CHLORIDE 0.9 % IV BOLUS (SEPSIS)
500.0000 mL | Freq: Once | INTRAVENOUS | Status: AC
  Administered 2013-01-02: 500 mL via INTRAVENOUS

## 2013-01-02 MED ORDER — SODIUM CHLORIDE 0.9 % IV SOLN
1.0000 g | Freq: Once | INTRAVENOUS | Status: AC
  Administered 2013-01-02: 1 g via INTRAVENOUS
  Filled 2013-01-02: qty 10

## 2013-01-02 NOTE — Progress Notes (Addendum)
PULMONARY  / CRITICAL CARE MEDICINE  Name: [name] MRN: [id number] DOB: June 15, 1929    ADMISSION DATE:  12/31/2012 CONSULTATION DATE:  12/31/2012  REFERRING MD :  EDP PRIMARY SERVICE:  PCCM  CHIEF COMPLAINT:  Abdominal pain/ SBO  BRIEF PATIENT DESCRIPTION: 79 yo without significant medical problems who developed abdominal pain, nausea, and vomiting 3 days prior to presentation to [facility] ED.  Found to have high grade SBO with pneumatosis and portal venous gas. Exploratory Lap early am 01/01/13, found foreign body imbedded in Small Bowel ?Bezoar. Pt. with hypoxia with LLL airspace disease.  SIGNIFICANT EVENTS / STUDIES:  3/31  Presented with nausea, vomiting, abdominal pain 3/31  Abdominal CT >>> High grade distal SBO and evidence of bowel ischemia 01/01/13  Exploratory Lap 01/01/13 Placed on ARDS Protocol  LINES / TUBES: NGT 3/31 >>> Foley 3/31 >>> ETT 01/01/13>>> L IJ CVC 01/02/13>>>  CULTURES: 4/2>>> Sputum 4/2>>>Blood 4/2>>> Urine  ANTIBIOTICS: Zosyn 3/31 (asp , abd coverage)>>>  The patient is intubated and unable to provide history.   INTERVAL HISTORY: Pt on vasopressors, required amiod  Drip for afib rvr 4/1.  Sedated on vent  VITAL SIGNS: Temp:  [99.1 F (37.3 C)-100.1 F (37.8 C)] 100.1 F (37.8 C) (04/02 0800) Pulse Rate:  [46-123] 76 (04/02 0900) Resp:  [20-35] 28 (04/02 0900) BP: (70-123)/(29-69) 97/42 mmHg (04/02 0900) SpO2:  [97 %-100 %] 98 % (04/02 0900) FiO2 (%):  [40 %-100 %] 40 % (04/02 0807) Weight:  [69.7 kg (153 lb 10.6 oz)] 69.7 kg (153 lb 10.6 oz) (04/02 0400) HEMODYNAMICS: CVP:  [8 mmHg-13 mmHg] 9 mmHg On low dose neo drip  VENTILATOR SETTINGS: Vent Mode:  [-] PRVC FiO2 (%):  [40 %-100 %] 40 % Set Rate:  [22 bmp-35 bmp] 28 bmp Vt Set:  [320 mL-420 mL] 420 mL PEEP:  [5 cmH20-12 cmH20] 5 cmH20 Plateau Pressure:  [13 cmH20-22 cmH20] 15 cmH20 INTAKE / OUTPUT: Intake/Output     04/01 0701 - 04/02 0700 04/02 0701 - 04/03 0700   I.V.  (mL/kg) 4057.4 (58.2) 342.7 (4.9)   Other 10    NG/GT 80 50   IV Piggyback 1310    Total Intake(mL/kg) 5457.4 (78.3) 392.7 (5.6)   Urine (mL/kg/hr) 375 (0.2)    Emesis/NG output 1300 (0.8)    Other     Total Output 1675 0   Net +3782.4 +392.7         PHYSICAL EXAMINATION: General:  Intubated, sedated,  Neuro:  MAEx4,  HEENT:  PERRL, NGT, dry mucous membranes Cardiovascular:  NSR on Amiodarone ( started 4/1), No RMG Lungs:  Bilaterally diminished L>R  Abdomen:  Distended, hypoactive BS, bilious drainage per NG. Musculoskeletal:  Moves all extremities, no edema noted Skin:  Intact  LABS:  Recent Labs Lab 12/31/12 1940 12/31/12 1950  01/01/13 0100 01/01/13 0350  01/01/13 1745 01/01/13 1958 01/01/13 2202 01/01/13 2300 01/02/13 0015 01/02/13 0455  HGB 16.5* 17.3*  --   --  14.2  --   --   --   --   --   --  12.3  WBC 9.8  --   --   --  7.6  --   --   --   --   --   --  11.7*  PLT 221  --   --   --  158  --   --   --   --   --   --  199  NA 125* 123*  --  125* 127*  --   --   --   --   --   --  129*  K 3.8 7.3*  --  4.0 3.6  --   --   --   --   --   --  3.7  CL 78* 85*  --  82* 88*  --   --   --   --   --   --  95*  CO2 28  --   --  31 26  --   --   --   --   --   --  23  GLUCOSE 175* 181*  --  159* 135*  --   --   --   --   --   --  151*  BUN 79* 131*  --  81* 78*  --   --   --   --   --   --  79*  CREATININE 2.49* 2.90*  --  2.65* 2.41*  --   --   --   --   --   --  3.04*  CALCIUM 9.2  --   --  7.9* 7.4*  --   --   --   --   --   --  6.4*  MG  --   --   --  1.9  --   --   --   --   --   --   --  1.7  PHOS  --   --   --  5.0*  --   --   --   --   --   --   --  5.4*  AST 26  --   --   --   --   --   --   --   --   --   --   --   ALT 14  --   --   --   --   --   --   --   --   --   --   --   ALKPHOS 69  --   --   --   --   --   --   --   --   --   --   --   BILITOT 1.6*  --   --   --   --   --   --   --   --   --   --   --   PROT 6.7  --   --   --   --   --   --   --    --   --   --   --   ALBUMIN 3.2*  --   --   --   --   --   --   --   --   --   --   --   APTT  --   --   --  29  --   --   --   --   --   --   --   --   INR  --   --   --  1.31  --   --   --   --   --   --   --   --   LATICACIDVEN  --   --   < > 2.0 1.4  < > 1.3  --   --  1.1  --  1.1  TROPONINI  --   --   --   --   --   --   --   --   --  <0.30  --   --   PROBNP 3133.0*  --   --   --   --   --   --   --   --   --   --   --   PHART  --   --   --   --   --   < >  --  7.288* 7.297*  --  7.346*  --   PCO2ART  --   --   --   --   --   < >  --  37.5 38.6  --  37.1  --   PO2ART  --   --   --   --   --   < >  --  146.0* 109.0*  --  124.0*  --   < > = values in this interval not displayed.  Recent Labs Lab 01/01/13 1626 01/01/13 1931 01/01/13 2309 01/02/13 0414 01/02/13 0736  GLUCAP 107* 114* 174* 131* 112*    CXR:  3/31 >>> LLL airspace disease/ collapse. ALI pattern             4/2 >>>Dense retrocardiac consolidation persists,  suggesting left lower lobe atelectasis with or without superimposed  pneumonia. No definite left effusion. No pneumothorax. Calcified  tortuous aorta.  ASSESSMENT / PLAN: Principal Problem:   SBO (small bowel obstruction) Active Problems:   PNA (pneumonia)   HTN (hypertension)   Hypoxia   Hyponatremia   Dehydration   Acute renal failure   Hyperglycemia   Acute respiratory failure   Hypovolemic shock   Sepsis(995.91)   Acute blood loss anemia   PULMONARY A:  Suspected aspiration pneumonia vs atelectasis LLL.  Hypoxemic respiratory failure. Probable ALI/ARDS from aspiration likely. P: Intubated for surgery 01/01/13 Not ready for extubation  01/02/13 AGREE Gaol SpO2>92 Does not tolerate TV of 6 cc's( Auto-Peep) AGREE Increased TV to 8cc/kg(420 cc) YES I-time to 0.75 Follow ABG's CXR stable 4/2 No Fob needed BDs Cont zosyn    CARDIOVASCULAR A: SIRS / sepsis. Shock state with afib rvr with low cvp: hypovolemic shock, doubt septic shock A-Fib  w/RVR 4/1>>Resolved 4/2 with Amio gtt P:  Goal MAP>60 Neo currently at 0.75 mcg ( 4/2)>>try to wean off Required Amio IV for new onset A-fib/RVR 4/1 Lactate trending down: 1.1( 4/2) Continue Zosyn( Good coverage for Aspiration) Hold Norvasc, ASA, Crestor, Avapro  RENAL A:  Acute renal failure in setting of intravascular depletion / ARF. ??baseline renal function . No old labs in system P:   BUN and CREAT trending up. Bolus x 1 (4/1) for poor u/o YES CVP=9 Fluid bolus x1 ( 500cc) 4/2 YES K+ 3.7 on 4/2: Replete as necessary YES NS@ 125/ hr YES Renal U/S for evaluation of renal mechanics YES Follow renal function YES  GASTROINTESTINAL A:  Distal SBO.  Suspected ischemic bowel. S/p ex lap with foreign body removal P:  Exploratory Lap 4/1 by CCS  Continue NPO per CCS NGT to Sx per CCS Protonix for GI Px   HEMATOLOGIC A:  Hemoconcentration.  H/o DVT, factor V Leiden. P:  Received 3L NS in ED 3/31 HGB 4/1: 12.3 Trend CBC Per CCS delay start of VTE agent >24h 2/2 surgical blood loss/ increased risk of bleed. I agree SCDs 4/1  INFECTIOUS A:  Suspected aspiration pneumonia 2/2 SBO P: Continue Zosyn YES      Trend WBC's, up 4/2 to 11.7       Pan Culture>>f/u results   ENDOCRINE  A:  Hyperglycemia.  No h/o DM.       Hyponatremia        Hypocalcemia P:   CBG's and SSI Good glycemic control 4/2  Monitor serum sodium, slowly improving Calcium corrects to 7.04 with albumin of 3.2( 3/31) CMET  4/3 Replete if remains low   NEUROLOGIC A:  No active issues. P:   No intervention required Sedation protocol Follows commands and moves all extremities with Wake-Up assessment  TODAY'S SUMMARY: 79 yo without significant medical problems who developed abdominal pain, nausea, and vomiting 3 days prior to presentation to [facility] ED.  Found to have high grade SBO with pneumatosis and portal venous gas.  Additionally, pt has  LLL airspace disease, hypoxia, as well as ARF.  3/31-4/1   fluid resuscitation,Exploratory lap per CCS early am 4/1. Returned to ICU intubated, alert with CXR indicating ALI pattern. Placed on ARDS protocol.  CVC placement 4/1. 4/2: Developed a-fib w/RVR which responded to Amiodarone Gtt. Has developed increased pressor requirements, worsening renal status, and was intolerant of 6cc TV per ARDS Protocol.   Kandice Robinsons, RN ACNP Student [employer] for Devra Dopp, ACNP   I have personally obtained a history, examined the patient, evaluated laboratory and imaging results, formulated the assessment and plan and placed orders.  CRITICAL CARE:  The patient is critically ill with multiple organ systems failure and requires high complexity decision making for assessment and support, frequent evaluation and titration of therapies, application of advanced monitoring technologies and extensive interpretation of multiple databases. Critical Care Time devoted to patient care services described in this note is 40 minutes.   Dorcas Carrow Beeper  [phone]  Cell  [phone]  If no response or cell goes to voicemail, call beeper [phone]

## 2013-01-02 NOTE — Progress Notes (Signed)
Patient ID: [name], female   DOB: 02-05-1929, 79 y.o.   MRN: [id number] 1 Day Post-Op  Subjective: Sedated but responsive on vent, not following commands  Objective: Vital signs in last 24 hours: Temp:  [99.1 F (37.3 C)-100.1 F (37.8 C)] 100.1 F (37.8 C) (04/02 0800) Pulse Rate:  [46-123] 80 (04/02 0700) Resp:  [20-35] 31 (04/02 0700) BP: (70-123)/(29-69) 112/47 mmHg (04/02 0700) SpO2:  [97 %-100 %] 100 % (04/02 0700) FiO2 (%):  [40 %-100 %] 40 % (04/02 0700) Weight:  [153 lb 10.6 oz (69.7 kg)] 153 lb 10.6 oz (69.7 kg) (04/02 0400) Last BM Date: 12/31/12  Intake/Output from previous day: 04/01 0701 - 04/02 0700 In: 5457.4 [I.V.:4057.4; NG/GT:80; IV Piggyback:1310] Out: 1675 [Urine:375; Emesis/NG output:1300] Intake/Output this shift:    General appearance: Sedated on vent Resp: Clear equal breath sounds anteriorly GI: - BS, moderate distention, mild diffuse tenderness Incision/Wound: Clean and dry  Lab Results:   Recent Labs  01/01/13 0350 01/02/13 0455  WBC 7.6 11.7*  HGB 14.2 12.3  HCT 39.6 35.7*  PLT 158 199   BMET  Recent Labs  01/01/13 0350 01/02/13 0455  NA 127* 129*  K 3.6 3.7  CL 88* 95*  CO2 26 23  GLUCOSE 135* 151*  BUN 78* 79*  CREATININE 2.41* 3.04*  CALCIUM 7.4* 6.4*     Studies/Results: Ct Abdomen Pelvis Wo Contrast  12/31/2012  *RADIOLOGY REPORT*  Clinical Data: Abdominal pain  CT ABDOMEN AND PELVIS WITHOUT CONTRAST  Technique:  Multidetector CT imaging of the abdomen and pelvis was performed following the standard protocol without intravenous contrast.  Comparison: None.  Findings: Extensive portal venous gas is seen throughout the liver.  There is extensive pneumatosis involving the distal duodenum and proximal jejunum.  No obvious internal hernia can be identified. Duodenal diverticulum is noted  Markedly dilated small bowel loops are seen leading to the pelvis. See image 72.  There is a focal ball of stool with decompressed  small bowel distally.  Colon is also completely decompressed. Little if any colonic gas.  This is compatible with a high-grade distal small bowel obstruction.  Extensive atherosclerotic vascular calcifications in the aorta and visceral vasculature.  No free intraperitoneal gas.  No abscess.  Marked distention of the stomach.  There is dense consolidation at the base of the lingula and left lower lobe with bronchiectasis.  Similar changes but to a lesser degree in the dependent portion of the right lower lobe.  Kidneys are atrophic.  Large benign appearing cystic lesion emanating from the lower pole of the left kidney.  Spleen is unremarkable.  Pancreas is atrophic.  Unremarkable gallbladder.  Degenerative changes in the lumbar spine without vertebral compression deformity.  IMPRESSION: There is pneumatosis involving the distal duodenum and proximal jejunum worrisome for ischemia.  Extensive portal venous gas in the liver is associated.  High-grade distal small bowel obstruction.  Extensive bibasilar consolidation left greater than right.  There is also bronchiectasis.  Critical Value/emergent results were called by telephone at the time of interpretation on 12/31/2012 at 2030 hours to Dr. Rhunette Croft, who verbally acknowledged these results.   Original Report Authenticated By: Jolaine Click, M.D.    Dg Chest Port 1 View  01/02/2013  *RADIOLOGY REPORT*  Clinical Data: Atelectasis.  Ventilator patient.  Respiratory distress.  PORTABLE CHEST - 1 VIEW  Comparison: 01/01/2013.  Findings: Unchanged ET tube, central venous catheter, and nasogastric tube.  Dense retrocardiac consolidation persists, suggesting left lower lobe atelectasis with  or without superimposed pneumonia.  No definite left effusion.  No pneumothorax.  Calcified tortuous aorta.  IMPRESSION: Stable chest.   Original Report Authenticated By: Davonna Belling, M.D.    Dg Chest Port 1 View  01/01/2013  *RADIOLOGY REPORT*  Clinical Data: Central line placement.   PORTABLE CHEST - 1 VIEW  Comparison: 12/31/2012 and 11/01/2010.  Findings: Left central line tip at the level of the proximal to mid superior vena cava.  No gross pneumothorax.  Mild asymmetric apical pleural thickening unchanged from remote exams.  Endotracheal tube tip 4.7 cm above the carina.  Nasogastric tube courses below the diaphragm.  The tip is not included on this exam.  Consolidation in the left lung base may represent atelectasis as there is mild mediastinal shift to the left.  Underlying infiltrate or mass not entirely excluded.  Attention to this on follow-up.  Pulmonary vascular prominence.  Calcified mildly tortuous aorta.  IMPRESSION: Left central line tip at the level of the proximal to mid superior vena cava.  Consolidation in the left lung base may represent atelectasis as there is mild mediastinal shift to the left.  .  Pulmonary vascular prominence.  Calcified mildly tortuous aorta.  This is a call report.   Original Report Authenticated By: Lacy Duverney, M.D.    Dg Chest Port 1 View  12/31/2012  *RADIOLOGY REPORT*  Clinical Data: Shortness of breath  PORTABLE CHEST - 1 VIEW  Comparison: 11/01/2010  Findings: 1900 hours.  Low volume film with elevation of the left hemidiaphragm and retrocardiac collapse / consolidation.  There is some minimal probable atelectasis at the right base. Telemetry leads overlie the chest.  IMPRESSION: Low volume film with left base collapse / consolidation.   Original Report Authenticated By: Kennith Center, M.D.     Anti-infectives: Anti-infectives   Start     Dose/Rate Route Frequency Ordered Stop   01/01/13 0600  piperacillin-tazobactam (ZOSYN) IVPB 2.25 g     2.25 g 100 mL/hr over 30 Minutes Intravenous 4 times per day 01/01/13 0221     12/31/12 2100  piperacillin-tazobactam (ZOSYN) IVPB 3.375 g     3.375 g 100 mL/hr over 30 Minutes Intravenous  Once 12/31/12 2048 12/31/12 2226   12/31/12 2100  metroNIDAZOLE (FLAGYL) IVPB 500 mg  Status:   Discontinued     500 mg 100 mL/hr over 60 Minutes Intravenous  Once 12/31/12 2048 12/31/12 2309      Assessment/Plan: s/p Procedure(s): EXPLORATORY LAPAROTOMY removal of smal bowel foreign body VDRF, ARI CCM managing vent Expected ileus.  Cont NG   LOS: 2 days    Bekki Tavenner T 01/02/2013

## 2013-01-03 ENCOUNTER — Inpatient Hospital Stay (HOSPITAL_COMMUNITY): Payer: Medicare Other

## 2013-01-03 LAB — BLOOD GAS, ARTERIAL
Drawn by: 225631
MECHVT: 420 mL
O2 Saturation: 94.2 %
PEEP: 5 cmH2O
Patient temperature: 98.6
RATE: 20 resp/min
pH, Arterial: 7.334 — ABNORMAL LOW (ref 7.350–7.450)

## 2013-01-03 LAB — COMPREHENSIVE METABOLIC PANEL
AST: 22 U/L (ref 0–37)
Albumin: 1.3 g/dL — ABNORMAL LOW (ref 3.5–5.2)
Chloride: 98 mEq/L (ref 96–112)
Creatinine, Ser: 3.01 mg/dL — ABNORMAL HIGH (ref 0.50–1.10)
Total Bilirubin: 0.4 mg/dL (ref 0.3–1.2)
Total Protein: 4.2 g/dL — ABNORMAL LOW (ref 6.0–8.3)

## 2013-01-03 LAB — GLUCOSE, CAPILLARY: Glucose-Capillary: 105 mg/dL — ABNORMAL HIGH (ref 70–99)

## 2013-01-03 LAB — CBC
MCHC: 35.6 g/dL (ref 30.0–36.0)
MCV: 85 fL (ref 78.0–100.0)
Platelets: 142 10*3/uL — ABNORMAL LOW (ref 150–400)
RDW: 14.8 % (ref 11.5–15.5)
WBC: 11.5 10*3/uL — ABNORMAL HIGH (ref 4.0–10.5)

## 2013-01-03 LAB — BASIC METABOLIC PANEL
BUN: 75 mg/dL — ABNORMAL HIGH (ref 6–23)
Calcium: 6.4 mg/dL — CL (ref 8.4–10.5)
Chloride: 102 mEq/L (ref 96–112)
Creatinine, Ser: 3.05 mg/dL — ABNORMAL HIGH (ref 0.50–1.10)
GFR calc Af Amer: 15 mL/min — ABNORMAL LOW (ref >90)

## 2013-01-03 LAB — URINE CULTURE

## 2013-01-03 MED ORDER — POTASSIUM CHLORIDE 10 MEQ/50ML IV SOLN
10.0000 meq | INTRAVENOUS | Status: AC
  Administered 2013-01-03 (×2): 10 meq via INTRAVENOUS
  Filled 2013-01-03 (×2): qty 50

## 2013-01-03 MED ORDER — POTASSIUM CHLORIDE 10 MEQ/50ML IV SOLN
10.0000 meq | INTRAVENOUS | Status: AC
  Administered 2013-01-03 (×4): 10 meq via INTRAVENOUS
  Filled 2013-01-03: qty 200

## 2013-01-03 MED ORDER — FENTANYL CITRATE 0.05 MG/ML IJ SOLN: 25.0000 ug | INTRAMUSCULAR | Status: DC | PRN

## 2013-01-03 MED ORDER — FENTANYL BOLUS VIA INFUSION: 25.0000 ug | INTRAVENOUS | Status: DC | PRN

## 2013-01-03 NOTE — Progress Notes (Signed)
ANTIBIOTIC CONSULT NOTE - FOLLOW UP  Pharmacy Consult for Zosyn Indication: Aspiration PNA, ischemic bowel  Allergies  Allergen Reactions  . Diovan (Valsartan)   . Hctz (Hydrochlorothiazide)   . Tekamlo (Aliskiren-Amlodipine)   . Valturna (Aliskiren-Valsartan)     Patient Measurements: Height: 5\' 3"  (160 cm) Weight: 153 lb 10.6 oz (69.7 kg) IBW/kg (Calculated) : 52.4 Adjusted Body Weight:   Vital Signs: Temp: 99.4 F (37.4 C) (04/03 0800) Temp src: Axillary (04/03 0800) BP: 115/44 mmHg (04/03 0800) Pulse Rate: 66 (04/03 0800) Intake/Output from previous day: 04/02 0701 - 04/03 0700 In: 4925.6 [I.V.:4095.6; NG/GT:80; IV Piggyback:750] Out: 1135 [Urine:435; Emesis/NG output:700] Intake/Output from this shift: Total I/O In: 226.6 [I.V.:166.6; NG/GT:60] Out: 90 [Urine:90]  Labs:  Recent Labs  01/01/13 0350 01/02/13 0455 01/03/13 0344  WBC 7.6 11.7* 11.5*  HGB 14.2 12.3 10.3*  PLT 158 199 142*  CREATININE 2.41* 3.04* 3.01*   Estimated Creatinine Clearance: 13.7 ml/min (by C-G formula based on Cr of 3.01). No results found for this basename: VANCOTROUGH, Leodis Binet, VANCORANDOM, GENTTROUGH, GENTPEAK, GENTRANDOM, TOBRATROUGH, TOBRAPEAK, TOBRARND, AMIKACINPEAK, AMIKACINTROU, AMIKACIN,  in the last 72 hours   Microbiology: Recent Results (from the past 720 hour(s))  MRSA PCR SCREENING     Status: None   Collection Time    01/01/13  7:04 AM      Result Value Range Status   MRSA by PCR NEGATIVE  NEGATIVE Final   Comment:            The GeneXpert MRSA Assay (FDA     approved for NASAL specimens     only), is one component of a     comprehensive MRSA colonization     surveillance program. It is not     intended to diagnose MRSA     infection nor to guide or     monitor treatment for     MRSA infections.  CULTURE, RESPIRATORY (NON-EXPECTORATED)     Status: None   Collection Time    01/02/13  9:52 AM      Result Value Range Status   Specimen Description SPU  SUCTIONED   Final   Special Requests NONE   Final   Gram Stain     Final   Value: FEW WBC PRESENT, PREDOMINANTLY PMN     RARE SQUAMOUS EPITHELIAL CELLS PRESENT     NO ORGANISMS SEEN   Culture PENDING   Incomplete   Report Status PENDING   Incomplete  CULTURE, BLOOD (ROUTINE X 2)     Status: None   Collection Time    01/02/13 10:40 AM      Result Value Range Status   Specimen Description BLOOD RIGHT ARM   Final   Special Requests BOTTLES DRAWN AEROBIC AND ANAEROBIC 10CC   Final   Culture  Setup Time 01/02/2013 15:27   Final   Culture     Final   Value:        BLOOD CULTURE RECEIVED NO GROWTH TO DATE CULTURE WILL BE HELD FOR 5 DAYS BEFORE ISSUING A FINAL NEGATIVE REPORT   Report Status PENDING   Incomplete  CULTURE, BLOOD (ROUTINE X 2)     Status: None   Collection Time    01/02/13 10:58 AM      Result Value Range Status   Specimen Description BLOOD RIGHT ARM   Final   Special Requests BOTTLES DRAWN AEROBIC AND ANAEROBIC 3CC   Final   Culture  Setup Time 01/02/2013 15:26   Final  Culture     Final   Value:        BLOOD CULTURE RECEIVED NO GROWTH TO DATE CULTURE WILL BE HELD FOR 5 DAYS BEFORE ISSUING A FINAL NEGATIVE REPORT   Report Status PENDING   Incomplete    Anti-infectives   Start     Dose/Rate Route Frequency Ordered Stop   01/01/13 0600  piperacillin-tazobactam (ZOSYN) IVPB 2.25 g     2.25 g 100 mL/hr over 30 Minutes Intravenous 4 times per day 01/01/13 0221     12/31/12 2100  piperacillin-tazobactam (ZOSYN) IVPB 3.375 g     3.375 g 100 mL/hr over 30 Minutes Intravenous  Once 12/31/12 2048 12/31/12 2226   12/31/12 2100  metroNIDAZOLE (FLAGYL) IVPB 500 mg  Status:  Discontinued     500 mg 100 mL/hr over 60 Minutes Intravenous  Once 12/31/12 2048 12/31/12 2309      Assessment: [name] on D#4 Zosyn for r/o aspiration PNA and ischemic bowel s/p exploratory lap.   ARF:  SCr remains ~ 3, 4/2 renal U/S shows BL renal cysts, negative hydronephrosis.    Tm24h: 100  WBC  slightly elevated, stable  Cultures:  Urine, blood, sputum collected 4/2  Plan:  1.  Continue Zosyn 2.25g IV q 6 h 2.  F/u renal fxn, cultures, Tm, WBC, clinical course  Deyona Soza E 01/03/2013,8:58 AM

## 2013-01-03 NOTE — Progress Notes (Signed)
PULMONARY  / CRITICAL CARE MEDICINE  Name: CLYDELL ALBERTS MRN: [id number] DOB: [birth year]    ADMISSION DATE:  12/31/2012 CONSULTATION DATE:  12/31/2012  REFERRING MD :  EDP PRIMARY SERVICE:  PCCM  CHIEF COMPLAINT:  Abdominal pain/ SBO  BRIEF PATIENT DESCRIPTION: 79 y/o without significant medical problems who developed abdominal pain, nausea, and vomiting 3 days prior to presentation to [facility] ED.  Found to have high grade SBO with pneumatosis and portal venous gas. Exploratory Lap early am 01/01/13, found foreign body imbedded in Small Bowel ?Bezoar. Pt. with hypoxia with LLL airspace disease.  SIGNIFICANT EVENTS / STUDIES:  3/31 - Presented with nausea, vomiting, abdominal pain 3/31 -  Abdominal CT >>> High grade distal SBO and evidence of bowel ischemia 4/01 - Exploratory Lap,  Placed on ARDS Protocol, a fib with RVR >> amiodarone added 4/03 - d/c ARDS protocol  LINES / TUBES: NGT 3/31 >>> Foley 3/31 >>> ETT 01/01/13>>> L IJ CVC 01/02/13>>>  CULTURES: 4/2>>> Sputum 4/2>>>Blood 4/2>>> Urine  ANTIBIOTICS: Zosyn 3/31 (abd coverage)>>>  INTERVAL HISTORY: Weaning pressors.  In sinus rhythm.  Tolerating pressure support.  VITAL SIGNS: Temp:  [99.2 F (37.3 C)-100 F (37.8 C)] 99.4 F (37.4 C) (04/03 0800) Pulse Rate:  [63-81] 66 (04/03 0800) Resp:  [27-28] 27 (04/03 0800) BP: (91-130)/(37-50) 115/44 mmHg (04/03 0800) SpO2:  [96 %-100 %] 100 % (04/03 0800) FiO2 (%):  [30 %] 30 % (04/03 0800)  HEMODYNAMICS: CVP:  [4 mmHg-20 mmHg] 4 mmHg   VENTILATOR SETTINGS: Vent Mode:  [-] PRVC FiO2 (%):  [30 %] 30 % Set Rate:  [28 bmp-2828 bmp] 2828 bmp Vt Set:  [420 mL] 420 mL PEEP:  [5 cmH20] 5 cmH20 Plateau Pressure:  [15 cmH20-18 cmH20] 17 cmH20  INTAKE / OUTPUT: Intake/Output     04/02 0701 - 04/03 0700 04/03 0701 - 04/04 0700   I.V. (mL/kg) 4095.6 (58.8) 166.6 (2.4)   Other     NG/GT 80 60   IV Piggyback 750    Total Intake(mL/kg) 4925.6 (70.7) 226.6 (3.3)   Urine  (mL/kg/hr) 435 (0.3) 90 (0.7)   Emesis/NG output 700 (0.4)    Total Output 1135 90   Net +3790.6 +136.6         PHYSICAL EXAMINATION: General:  Intubated, sedated Neuro:  MAEx4,  HEENT:  PERRL, NGT, dry mucous membranes Cardiovascular:  NSR on Amiodarone ( started 4/1), No RMG Lungs:  Bilaterally diminished L>R  Abdomen:  Distended, hypoactive BS, bilious drainage per NG. Musculoskeletal:  Moves all extremities, no edema noted Skin:  Intact  LABS:  Recent Labs Lab 12/31/12 1940 12/31/12 1950  01/01/13 0100 01/01/13 0350  01/01/13 1745  01/01/13 2202 01/01/13 2300 01/02/13 0015 01/02/13 0455 01/02/13 0945 01/03/13 0344  HGB 16.5* 17.3*  --   --  14.2  --   --   --   --   --   --  12.3  --  10.3*  WBC 9.8  --   --   --  7.6  --   --   --   --   --   --  11.7*  --  11.5*  PLT 221  --   --   --  158  --   --   --   --   --   --  199  --  142*  NA 125* 123*  --  125* 127*  --   --   --   --   --   --  129*  --  131*  K 3.8 7.3*  --  4.0 3.6  --   --   --   --   --   --  3.7  --  2.8*  CL 78* 85*  --  82* 88*  --   --   --   --   --   --  95*  --  98  CO2 28  --   --  31 26  --   --   --   --   --   --  23  --  18*  GLUCOSE 175* 181*  --  159* 135*  --   --   --   --   --   --  151*  --  115*  BUN 79* 131*  --  81* 78*  --   --   --   --   --   --  79*  --  77*  CREATININE 2.49* 2.90*  --  2.65* 2.41*  --   --   --   --   --   --  3.04*  --  3.01*  CALCIUM 9.2  --   --  7.9* 7.4*  --   --   --   --   --   --  6.4*  --  6.3*  MG  --   --   --  1.9  --   --   --   --   --   --   --  1.7  --   --   PHOS  --   --   --  5.0*  --   --   --   --   --   --   --  5.4*  --   --   AST 26  --   --   --   --   --   --   --   --   --   --   --   --  22  ALT 14  --   --   --   --   --   --   --   --   --   --   --   --  12  ALKPHOS 69  --   --   --   --   --   --   --   --   --   --   --   --  55  BILITOT 1.6*  --   --   --   --   --   --   --   --   --   --   --   --  0.4  PROT 6.7  --    --   --   --   --   --   --   --   --   --   --   --  4.2*  ALBUMIN 3.2*  --   --   --   --   --   --   --   --   --   --   --   --  1.3*  APTT  --   --   --  29  --   --   --   --   --   --   --   --   --   --   INR  --   --   --  1.31  --   --   --   --   --   --   --   --   --   --   LATICACIDVEN  --   --   < > 2.0 1.4  < > 1.3  --   --  1.1  --  1.1  --   --   TROPONINI  --   --   --   --   --   --   --   --   --  <0.30  --   --   --   --   PROBNP 3133.0*  --   --   --   --   --   --   --   --   --   --   --   --   --   PHART  --   --   --   --   --   < >  --   < > 7.297*  --  7.346*  --  7.429  --   PCO2ART  --   --   --   --   --   < >  --   < > 38.6  --  37.1  --  28.4*  --   PO2ART  --   --   --   --   --   < >  --   < > 109.0*  --  124.0*  --  116.0*  --   < > = values in this interval not displayed.  Recent Labs Lab 01/02/13 1149 01/02/13 1541 01/02/13 1935 01/02/13 2339 01/03/13 0422  GLUCAP 110* 111* 115* 110* 119*    Imaging: US Renal Port  01/02/2013  *RADIOLOGY REPORT*  Clinical Data: Acute renal insufficiency  RENAL/URINARY TRACT ULTRASOUND COMPLETE  Comparison:  CT 12/31/2012  Findings:  Right Kidney:  15 mm cyst right upper pole.  Negative for obstruction.  Renal cortex is normal.  Right kidney measures 9.8 cm in length.  Left Kidney:  9.8 cm in length.  Negative for obstruction.  Large upper pole cyst measures 5 x 6 cm.  Normal appearing renal cortex.  Gallbladder sludge and possible gallstones are noted.  Bladder:  Empty bladder.  IMPRESSION: Bilateral renal cysts.  Negative for hydronephrosis  Gallbladder sludge and possible gallstones.   Original Report Authenticated By: Janeece Riggers, M.D.    Dg Chest Port 1 View  01/03/2013  *RADIOLOGY REPORT*  Clinical Data: Follow-up atelectasis and pneumonia.  PORTABLE CHEST - 1 VIEW  Comparison: 01/02/2013  Findings: Endotracheal tube tip measures 5 cm above the carina. Enteric tube tip is at the field of view but below the left  hemidiaphragm.  Left central venous catheter tip is directed horizontally in the mediastinum consistent location of the junction of the brachial cephalic and superior vena cava.  Shallow inspiration.  Borderline heart size and pulmonary vascularity. Mild perihilar infiltration.  Bilateral basilar atelectasis or infiltration.  Obscuration of the left hemidiaphragm suggest small effusion.  No pneumothorax.  Calcification of the aorta.  IMPRESSION: Increasing perihilar and basilar infiltration or atelectasis. Small left pleural effusion.  Changes may be due to increase congestion or pneumonia.   Original Report Authenticated By: Burman Nieves, M.D.    Dg Chest Port 1 View  01/02/2013  *RADIOLOGY REPORT*  Clinical Data: Atelectasis.  Ventilator patient.  Respiratory distress.  PORTABLE CHEST - 1 VIEW  Comparison: 01/01/2013.  Findings: Unchanged ET tube,  central venous catheter, and nasogastric tube.  Dense retrocardiac consolidation persists, suggesting left lower lobe atelectasis with or without superimposed pneumonia.  No definite left effusion.  No pneumothorax.  Calcified tortuous aorta.  IMPRESSION: Stable chest.   Original Report Authenticated By: Davonna Belling, M.D.                   ASSESSMENT / PLAN: Principal Problem:   SBO (small bowel obstruction) Active Problems:   PNA (pneumonia)   HTN (hypertension)   Hypoxia   Hyponatremia   Dehydration   Acute renal failure   Hyperglycemia   Acute respiratory failure   Hypovolemic shock   Sepsis(995.91)   Acute blood loss anemia   PULMONARY A:   Suspected atelectasis LLL.   Hypoxemic respiratory failure after surgery.  P:  -pressure support wean as tolerated >> may be able to extubate soon -CXR PRN -BDs -ABX per ID section    CARDIOVASCULAR A:  SIRS / sepsis - Shock state with afib rvr with low cvp: hypovolemic shock, doubt septic shock.  Lactate trending down: 1.1( 4/2).    A-Fib w/RVR  - noted 4/1, resolved 4/2 with Amio gtt.   Negative troponin.   P:  Wean neo for MAP >60 Amio IV for new onset A-fib/RVR 4/1 Continue Zosyn  Hold Norvasc, ASA, Crestor, Avapro  RENAL A:   Acute renal failure - in setting of intravascular depletion / ARF. ?? baseline renal function. No old labs in system.  Renal U/S with bilateral renal cysts, negative hydronephrosis.  Hypokalemia Hyponatremia Hypocalcemia  P:   Replace K+ (additional ), f/u BMP 4/3 1600 Follow renal function  NS at 125 Monitor serum sodium, slowly improving Calcium corrects to 8.5 with albumin 4/3    GASTROINTESTINAL A:   Distal SBO.   Suspected ischemic bowel.  S/p ex lap 4/1 with foreign body removal  P: Continue NPO per CCS NGT to Sx per CCS Protonix for GI Px   HEMATOLOGIC A:   Hemoconcentration.   H/o DVT, factor V Leiden.  P:  Trend CBC Per CCS delay start of VTE agent >24h 2/2 surgical blood loss/ increased risk of bleed.  SCDs 4/1, consider start anti-coagulation when okay with surgery  INFECTIOUS A:   Suspected aspiration pneumonia 2/2 SBO  P:  Continue Zosyn  Trend WBC's, up 4/2 to 11.7 Follow culture results   ENDOCRINE  A:   Hyperglycemia - No h/o DM.   P:   CBG's and SSI   NEUROLOGIC A:   No active issues.  P:   Limit sedation while weaning vent   Canary Brim, NP-C [place] [facility] Pgr: [phone] or [phone]   Reviewed above, examined pt, and agree with assessment/plan.  Weaning pressors.  Oxygenation improved, and tolerating pressure support.  Will hold sedation and determine if she could tolerate extubation.  Cc time 35 minutes.  Coralyn Helling, MD [facility] Pulmonary/Critical Care 01/03/2013, 12:23 PM Pager:  [phone] After 3pm call: [phone]

## 2013-01-03 NOTE — Progress Notes (Signed)
Hypocalcemia   Corrected calcium is 8.6. No intervention required.

## 2013-01-03 NOTE — Progress Notes (Signed)
eLink Physician Progress Note and Electrolyte Replacement  Patient Name: [name] DOB: [birth year] MRN: [id number]  Date of Service  01/03/2013   HPI/Events of Note    Recent Labs Lab 12/31/12 1940 12/31/12 1950 01/01/13 0100 01/01/13 0350 01/02/13 0455 01/03/13 0344  NA 125* 123* 125* 127* 129* 131*  K 3.8 7.3* 4.0 3.6 3.7 2.8*  CL 78* 85* 82* 88* 95* 98  CO2 28  --  31 26 23  18*  GLUCOSE 175* 181* 159* 135* 151* 115*  BUN 79* 131* 81* 78* 79* 77*  CREATININE 2.49* 2.90* 2.65* 2.41* 3.04* 3.01*  CALCIUM 9.2  --  7.9* 7.4* 6.4* 6.3*  MG  --   --  1.9  --  1.7  --   PHOS  --   --  5.0*  --  5.4*  --     Estimated Creatinine Clearance: 13.7 ml/min (by C-G formula based on Cr of 3.01).  Intake/Output     04/02 0701 - 04/03 0700   I.V. (mL/kg) 3181.3 (45.6)   NG/GT 80   IV Piggyback 650   Total Intake(mL/kg) 3911.3 (56.1)   Urine (mL/kg/hr) 285 (0.2)   Emesis/NG output 700 (0.4)   Total Output 985   Net +2926.3        - I/O DETAILED x 24h    Total I/O In: 1209.1 [I.V.:1159.1; IV Piggyback:50] Out: 140 [Urine:90; Emesis/NG output:50] - I/O THIS SHIFT    ASSESSMENT Low K with renal failure  eICURN Interventions  kcl only   ASSESSMENT: MAJOR ELECTROLYTE      Dr. Kalman Shan, M.D., [facility].C.P Pulmonary and Critical Care Medicine Staff Physician [facility] [place] [facility] Pager: [phone], If no answer or between  15:00h - 7:00h: call [phone]  01/03/2013 4:50 AM

## 2013-01-03 NOTE — Procedures (Signed)
Extubation Procedure Note  Patient Details:   Name: [name] DOB: 1929-08-11 MRN: [id number]   Airway Documentation:  Airway 7.5 mm (Active)  Secured at (cm) 20 cm 01/03/2013 11:38 AM  Measured From Lips 01/03/2013 11:38 AM  Secured Location Left 01/03/2013 11:38 AM  Secured By [employer] 01/03/2013 11:38 AM  Tube Holder Repositioned Yes 01/03/2013 11:38 AM  Cuff Pressure (cm H2O) 22 cm H2O 01/03/2013  7:58 AM  Site Condition Dry 01/03/2013 11:38 AM    Evaluation  O2 sats: stable throughout Complications: No apparent complications Patient did tolerate procedure well. Bilateral Breath Sounds: Diminished;Rhonchi Suctioning: Airway Yes  Suzan Garibaldi 01/03/2013, 1:45 PM

## 2013-01-03 NOTE — Progress Notes (Signed)
CRITICAL VALUE ALERT  Critical value received:  Calcium 6.4  Date of notification:  01/03/13  Time of notification:  17:20  Critical value read back:yes  Nurse who received alert:  Haskell Flirt, RN MD notified (1st page):  Dr. Frederico Hamman  Time of first page:  17:25  MD notified (2nd page):  Time of second page:  Responding MD:  Dr. Frederico Hamman  Time MD responded:  17:25

## 2013-01-03 NOTE — Progress Notes (Signed)
Patient ID: [name], female   DOB: [birth year], 79 y.o.   MRN: [id number] 2 Days Post-Op  Subjective: Sedated on the vent but responsive and following commands. Denies abdominal Sanders.  Objective: Vital signs in last 24 hours: Temp:  [99.2 F (37.3 C)-100 F (37.8 C)] 99.4 F (37.4 C) (04/03 0800) Pulse Rate:  [63-81] 66 (04/03 0800) Resp:  [27-28] 27 (04/03 0800) BP: (91-130)/(37-50) 115/44 mmHg (04/03 0800) SpO2:  [96 %-100 %] 100 % (04/03 0800) FiO2 (%):  [30 %] 30 % (04/03 0800) Last BM Date: 12/31/12  Intake/Output from previous day: 04/02 0701 - 04/03 0700 In: 4925.6 [I.V.:4095.6; NG/GT:80; IV Piggyback:750] Out: 1135 [Urine:435; Emesis/NG output:700] Intake/Output this shift: Total I/O In: 226.6 [I.V.:166.6; NG/GT:60] Out: 90 [Urine:90]  General appearance: sedated on the vent, no distress, following commands Resp: clear equal breath sounds anteriorly GI: normal findings: soft, non-tender and mild distention and no bowel sounds Incision/Wound: clean and dry without erythema or other signs of infection  Lab Results:   Recent Labs  01/02/13 0455 01/03/13 0344  WBC 11.7* 11.5*  HGB 12.3 10.3*  HCT 35.7* 28.9*  PLT 199 142*   BMET  Recent Labs  01/02/13 0455 01/03/13 0344  NA 129* 131*  K 3.7 2.8*  CL 95* 98  CO2 23 18*  GLUCOSE 151* 115*  BUN 79* 77*  CREATININE 3.04* 3.01*  CALCIUM 6.4* 6.3*     Studies/Results: US Renal Port  01/02/2013  *RADIOLOGY REPORT*  Clinical Data: Acute renal insufficiency  RENAL/URINARY TRACT ULTRASOUND COMPLETE  Comparison:  CT 12/31/2012  Findings:  Right Kidney:  15 mm cyst right upper pole.  Negative for obstruction.  Renal cortex is normal.  Right kidney measures 9.8 cm in length.  Left Kidney:  9.8 cm in length.  Negative for obstruction.  Large upper pole cyst measures 5 x 6 cm.  Normal appearing renal cortex.  Gallbladder sludge and possible gallstones are noted.  Bladder:  Empty bladder.  IMPRESSION: Bilateral  renal cysts.  Negative for hydronephrosis  Gallbladder sludge and possible gallstones.   Original Report Authenticated By: Janeece Riggers, M.D.    Dg Chest Port 1 View  01/03/2013  *RADIOLOGY REPORT*  Clinical Data: Follow-up atelectasis and pneumonia.  PORTABLE CHEST - 1 VIEW  Comparison: 01/02/2013  Findings: Endotracheal tube tip measures 5 cm above the carina. Enteric tube tip is at the field of view but below the left hemidiaphragm.  Left central venous catheter tip is directed horizontally in the mediastinum consistent location of the junction of the brachial cephalic and superior vena cava.  Shallow inspiration.  Borderline heart size and pulmonary vascularity. Mild perihilar infiltration.  Bilateral basilar atelectasis or infiltration.  Obscuration of the left hemidiaphragm suggest small effusion.  No pneumothorax.  Calcification of the aorta.  IMPRESSION: Increasing perihilar and basilar infiltration or atelectasis. Small left pleural effusion.  Changes may be due to increase congestion or pneumonia.   Original Report Authenticated By: Burman Nieves, M.D.    Dg Chest Port 1 View  01/02/2013  *RADIOLOGY REPORT*  Clinical Data: Atelectasis.  Ventilator patient.  Respiratory distress.  PORTABLE CHEST - 1 VIEW  Comparison: 01/01/2013.  Findings: Unchanged ET tube, central venous catheter, and nasogastric tube.  Dense retrocardiac consolidation persists, suggesting left lower lobe atelectasis with or without superimposed pneumonia.  No definite left effusion.  No pneumothorax.  Calcified tortuous aorta.  IMPRESSION: Stable chest.   Original Report Authenticated By: Davonna Belling, M.D.    Dg Chest [facility]  1 View  01/01/2013  *RADIOLOGY REPORT*  Clinical Data: Central line placement.  PORTABLE CHEST - 1 VIEW  Comparison: 12/31/2012 and 11/01/2010.  Findings: Left central line tip at the level of the proximal to mid superior vena cava.  No gross pneumothorax.  Mild asymmetric apical pleural thickening unchanged  from remote exams.  Endotracheal tube tip 4.7 cm above the carina.  Nasogastric tube courses below the diaphragm.  The tip is not included on this exam.  Consolidation in the left lung base may represent atelectasis as there is mild mediastinal shift to the left.  Underlying infiltrate or mass not entirely excluded.  Attention to this on follow-up.  Pulmonary vascular prominence.  Calcified mildly tortuous aorta.  IMPRESSION: Left central line tip at the level of the proximal to mid superior vena cava.  Consolidation in the left lung base may represent atelectasis as there is mild mediastinal shift to the left.  .  Pulmonary vascular prominence.  Calcified mildly tortuous aorta.  This is a call report.   Original Report Authenticated By: Lacy Duverney, M.D.     Anti-infectives: Anti-infectives   Start     Dose/Rate Route Frequency Ordered Stop   01/01/13 0600  piperacillin-tazobactam (ZOSYN) IVPB 2.25 g     2.25 g 100 mL/hr over 30 Minutes Intravenous 4 times per day 01/01/13 0221     12/31/12 2100  piperacillin-tazobactam (ZOSYN) IVPB 3.375 g     3.375 g 100 mL/hr over 30 Minutes Intravenous  Once 12/31/12 2048 12/31/12 2226   12/31/12 2100  metroNIDAZOLE (FLAGYL) IVPB 500 mg  Status:  Discontinued     500 mg 100 mL/hr over 60 Minutes Intravenous  Once 12/31/12 2048 12/31/12 2309      Assessment/Plan: s/p Procedure(s): EXPLORATORY LAPAROTOMY removal of smal bowel foreign body Pathology showed phytobezoar Ventilator dependent respiratory failure, CCM managing Acute renal insufficiency, appears to be stabilizing with better urine output Expected ileus. Continue NG. Had some contamination at surgery and will continue antibiotics for now.   LOS: 3 days    Jasson Siegmann T 01/03/2013

## 2013-01-04 ENCOUNTER — Inpatient Hospital Stay (HOSPITAL_COMMUNITY): Payer: Medicare Other

## 2013-01-04 LAB — BASIC METABOLIC PANEL
BUN: 75 mg/dL — ABNORMAL HIGH (ref 6–23)
Calcium: 7 mg/dL — ABNORMAL LOW (ref 8.4–10.5)
Creatinine, Ser: 3.11 mg/dL — ABNORMAL HIGH (ref 0.50–1.10)
GFR calc non Af Amer: 13 mL/min — ABNORMAL LOW (ref >90)
Glucose, Bld: 88 mg/dL (ref 70–99)

## 2013-01-04 LAB — GLUCOSE, CAPILLARY
Glucose-Capillary: 103 mg/dL — ABNORMAL HIGH (ref 70–99)
Glucose-Capillary: 85 mg/dL (ref 70–99)
Glucose-Capillary: 86 mg/dL (ref 70–99)
Glucose-Capillary: 91 mg/dL (ref 70–99)

## 2013-01-04 LAB — CULTURE, RESPIRATORY W GRAM STAIN

## 2013-01-04 LAB — CBC
MCH: 29.6 pg (ref 26.0–34.0)
MCHC: 33.9 g/dL (ref 30.0–36.0)
Platelets: 107 10*3/uL — ABNORMAL LOW (ref 150–400)
RDW: 15.1 % (ref 11.5–15.5)

## 2013-01-04 MED ORDER — POTASSIUM CHLORIDE 10 MEQ/100ML IV SOLN
10.0000 meq | INTRAVENOUS | Status: AC
  Administered 2013-01-04 (×4): 10 meq via INTRAVENOUS
  Filled 2013-01-04: qty 400

## 2013-01-04 MED ORDER — FUROSEMIDE 10 MG/ML IJ SOLN
20.0000 mg | Freq: Once | INTRAMUSCULAR | Status: AC
  Administered 2013-01-04: 20 mg via INTRAVENOUS

## 2013-01-04 MED ORDER — ONDANSETRON HCL 4 MG/2ML IJ SOLN
4.0000 mg | Freq: Four times a day (QID) | INTRAMUSCULAR | Status: DC | PRN
  Administered 2013-01-09 – 2013-01-27 (×11): 4 mg via INTRAVENOUS
  Filled 2013-01-04 (×11): qty 2

## 2013-01-04 NOTE — Progress Notes (Signed)
CARE MANAGEMENT NOTE 01/04/2013  Patient:  [name], [name]   Account Number:  [id number]  Date Initiated:  01/01/2013  Documentation initiated by:  Davi Kroon  Subjective/Objective Assessment:   pt with post op resp failure on vent s/p colon resection due to sbo     Action/Plan:   from home   Anticipated DC Date:  01/07/2013   Anticipated DC Plan:  SKILLED NURSING FACILITY  In-house referral  NA      DC Planning Services  NA      [facility] Choice  NA   Choice offered to / List presented to:  NA   DME arranged  NA      DME agency  NA     HH arranged  NA      HH agency  NA   Status of service:  In process, will continue to follow Medicare Important Message given?  NA - LOS <3 / Initial given by admissions (If response is "NO", the following Medicare IM given date fields will be blank) Date Medicare IM given:   Date Additional Medicare IM given:    Discharge Disposition:    Per UR Regulation:  Reviewed for med. necessity/level of care/duration of stay  If discussed at Long Length of Stay Meetings, dates discussed:    Comments:  [id number] Earlene Plater, RN, BSN, CCM:  CHART REVIEWED AND UPDATED.  Next chart review due on [id number]. NO DISCHARGE NEEDS PRESENT AT THIS TIME.  patient agonal breathing vent mask, awaiting family to see next step toward treatment versus palliative care.  Patient is a full code at this time.  Patient is off sedation and unresponsive. CASE MANAGEMENT [phone]   [id number] Earlene Plater, RN, BSN, CCM:  CHART REVIEWED AND UPDATED.  Next chart review due on [id number]. NO DISCHARGE NEEDS PRESENT AT THIS TIME. CASE MANAGEMENT [phone]

## 2013-01-04 NOTE — Progress Notes (Signed)
Patient interviewed and examined, agree with PA note above. She is extubated but very weak. She is able to deny abdominal pain. Abdomen is a little distended but soft and nontender.  Mariella Saa MD, FACS  01/04/2013 5:33 PM

## 2013-01-04 NOTE — Progress Notes (Signed)
NUTRITION FOLLOW UP  Intervention:   Advance diet as tolerated per MD discretion Add supplements as needed if po intake is poor  Nutrition Dx:   Inadequate oral intake related to inability to eat as evidenced by pt NPO and on ventilator; ongoing, pt no longer on vent but still NPO  Goal:   Pt to meet >/= 90% of their estimated nutrition needs; not met  Monitor:   Vent status; pt extubated and on Venturi mask Wt; 23 lb wt gain from 4/1 to 4/4 (possibly fluid/edema or inaccurate) Diet advancement/PO intake  Assessment:   79 yo without significant medical problems who developed abdominal pain, nausea, and vomiting 3 days prior to presentation to [facility] ED. Found to have high grade SBO with pneumatosis and portal venous gas. Pt had exploratory laparotomy with small bowel enterotomy and removal of foreign body today. The patient was left intubated due to the tenuous pulmonary status of the pneumonia and significant amounts of oxygen requirement preoperative.   4/1: Per pt's sister-in-law in the room, pt is usually a good eater with a normal appetite but, Friday 3/28 pt starting having nausea and vomiting and she hasn't eat much of anything since. Pt's usual body weight is 140 lbs per sister-in-law.  4/4:  Pt is extubated and off sedation but, unresponsive at time of visit. Per MD note: Advance diet per CCS. Pt continues to be NPO. Appetite was good PTA. No nutrition interventions warranted at this time. RD will continue to monitor pt.  Height: Ht Readings from Last 1 Encounters:  01/01/13 5\' 3"  (1.6 m)    Weight Status:   Wt Readings from Last 1 Encounters:  01/04/13 164 lb 7.4 oz (74.6 kg)  01/01/13   141 lbs (64 kg)   Re-estimated needs: (used wt of 64 kg from 4/1) Kcal: 1410-1650 Protein: 77-90 grams Fluid: 1.9-2.1 L  Skin: +1 Generalized edema, +2 RUE and LUE edema, +1 RLE and LLE edema; abdominal incision  Diet Order: NPO   Intake/Output Summary (Last 24 hours) at 01/04/13  1527 Last data filed at 01/04/13 1400  Gross per 24 hour  Intake 3539.1 ml  Output   1480 ml  Net 2059.1 ml    Last BM: 4/4   Labs:   Recent Labs Lab 12/31/12 1950 01/01/13 0100  01/02/13 0455 01/03/13 0344 01/03/13 1620 01/04/13 0414  NA 123* 125*  < > 129* 131* 133* 134*  K 7.3* 4.0  < > 3.7 2.8* 3.9 3.6  CL 85* 82*  < > 95* 98 102 103  CO2  --  31  < > 23 18* 17* 16*  BUN 131* 81*  < > 79* 77* 75* 75*  CREATININE 2.90* 2.65*  < > 3.04* 3.01* 3.05* 3.11*  CALCIUM  --  7.9*  < > 6.4* 6.3* 6.4* 7.0*  MG  --  1.9  --  1.7  --   --   --   PHOS  --  5.0*  --  5.4*  --   --   --   GLUCOSE 181* 159*  < > 151* 115* 102* 88  < > = values in this interval not displayed.  CBG (last 3)   Recent Labs  01/04/13 0358 01/04/13 0819 01/04/13 1253  GLUCAP 79 71 85    Scheduled Meds: . antiseptic oral rinse  15 mL Mouth Rinse QID  . chlorhexidine  15 mL Mouth Rinse BID  . heparin subcutaneous  5,000 Units Subcutaneous Q8H  . insulin  aspart  0-15 Units Subcutaneous Q4H  . pantoprazole (PROTONIX) IV  40 mg Intravenous Q24H  . piperacillin-tazobactam (ZOSYN)  IV  2.25 g Intravenous Q6H  . sodium chloride  1,000 mL Intravenous Once    Continuous Infusions: . sodium chloride 50 mL/hr at 01/04/13 1100  . amiodarone (NEXTERONE PREMIX) 360 mg/200 mL dextrose 30 mg/hr (01/04/13 0900)    Ian Malkin RD, LDN Inpatient Clinical Dietitian Pager: [phone] After Hours Pager: [phone]

## 2013-01-04 NOTE — Progress Notes (Signed)
PULMONARY  / CRITICAL CARE MEDICINE  Name: [name] MRN: [id number] DOB: [birth year]    ADMISSION DATE:  12/31/2012 CONSULTATION DATE:  12/31/2012  REFERRING MD :  EDP PRIMARY SERVICE:  PCCM  CHIEF COMPLAINT:  Abdominal pain/ SBO  BRIEF PATIENT DESCRIPTION: 79 y/o without significant medical problems who developed abdominal pain, nausea, and vomiting 3 days prior to presentation to [facility] ED.  Found to have high grade SBO with pneumatosis and portal venous gas. Exploratory Lap early am 01/01/13, found foreign body imbedded in Small Bowel ?Bezoar. Pt. with hypoxia with LLL airspace disease.  SIGNIFICANT EVENTS / STUDIES:  3/31 - Presented with nausea, vomiting, abdominal pain 3/31 -  Abdominal CT >>> High grade distal SBO and evidence of bowel ischemia 4/01 - Exploratory Lap,  Placed on ARDS Protocol, a fib with RVR >> amiodarone added 4/03 - d/c ARDS protocol  LINES / TUBES: NGT 3/31 >>> Foley 3/31 >>> ETT 01/01/13>>>4-3 L IJ CVC 01/02/13>>>  CULTURES: 4/2>>> Sputum >> Few E coli 4/2>>>Blood 4/2>>> Urine >> negative  ANTIBIOTICS: Zosyn 3/31 (abd coverage)>>>  INTERVAL HISTORY:  Denies chest pain.  Abd pain controlled.  Had BM this AM.  VITAL SIGNS: Temp:  [98.6 F (37 C)-101.3 F (38.5 C)] 98.9 F (37.2 C) (04/04 0800) Pulse Rate:  [75-100] 87 (04/04 0800) Resp:  [17-24] 18 (04/04 0800) BP: (105-138)/(26-69) 105/61 mmHg (04/04 0800) SpO2:  [90 %-99 %] 92 % (04/04 0800) FiO2 (%):  [30 %-35 %] 35 % (04/04 0800) Weight:  [74.6 kg (164 lb 7.4 oz)] 74.6 kg (164 lb 7.4 oz) (04/04 0500)  HEMODYNAMICS: CVP:  [15 mmHg-17 mmHg] 17 mmHg   VENTILATOR SETTINGS: Vent Mode:  [-] PSV;CPAP FiO2 (%):  [30 %-35 %] 35 % Set Rate:  [20 bmp] 20 bmp Vt Set:  [420 mL] 420 mL PEEP:  [5 cmH20] 5 cmH20 Pressure Support:  [8 cmH20] 8 cmH20 Plateau Pressure:  [14 cmH20] 14 cmH20  INTAKE / OUTPUT: Intake/Output     04/03 0701 - 04/04 0700 04/04 0701 - 04/05 0700   I.V. (mL/kg) 3266.3  (43.8)    NG/GT 60    IV Piggyback 550    Total Intake(mL/kg) 3876.3 (52)    Urine (mL/kg/hr) 650 (0.4)    Emesis/NG output 450 (0.3)    Total Output 1100     Net +2776.3          Stool Occurrence 2 x     PHYSICAL EXAMINATION: General:  lethargic Neuro:  MAEx4, wd to pain HEENT:  PERRL, NGT, dry mucous membranes Cardiovascular:  irregular on Amiodarone ( started 4/1), No RMG Lungs:  Bilaterally diminished L>R  Abdomen:  Distended, hypoactive BS, bilious drainage per NG. Musculoskeletal:  Moves all extremities, 1+ edema noted Skin:  Intact  LABS:  Recent Labs Lab 12/31/12 1940 12/31/12 1950  01/01/13 0100  01/01/13 1745  01/01/13 2202 01/01/13 2300 01/02/13 0015 01/02/13 0455 01/02/13 0945 01/03/13 0344 01/03/13 0950 01/03/13 1620 01/04/13 0414  HGB 16.5* 17.3*  --   --   < >  --   --   --   --   --  12.3  --  10.3*  --   --  11.8*  WBC 9.8  --   --   --   < >  --   --   --   --   --  11.7*  --  11.5*  --   --  11.0*  PLT 221  --   --   --   < >  --   --   --   --   --  199  --  142*  --   --  107*  NA 125* 123*  --  125*  < >  --   --   --   --   --  129*  --  131*  --  133* 134*  K 3.8 7.3*  --  4.0  < >  --   --   --   --   --  3.7  --  2.8*  --  3.9 3.6  CL 78* 85*  --  82*  < >  --   --   --   --   --  95*  --  98  --  102 103  CO2 28  --   --  31  < >  --   --   --   --   --  23  --  18*  --  17* 16*  GLUCOSE 175* 181*  --  159*  < >  --   --   --   --   --  151*  --  115*  --  102* 88  BUN 79* 131*  --  81*  < >  --   --   --   --   --  79*  --  77*  --  75* 75*  CREATININE 2.49* 2.90*  --  2.65*  < >  --   --   --   --   --  3.04*  --  3.01*  --  3.05* 3.11*  CALCIUM 9.2  --   --  7.9*  < >  --   --   --   --   --  6.4*  --  6.3*  --  6.4* 7.0*  MG  --   --   --  1.9  --   --   --   --   --   --  1.7  --   --   --   --   --   PHOS  --   --   --  5.0*  --   --   --   --   --   --  5.4*  --   --   --   --   --   AST 26  --   --   --   --   --   --   --   --    --   --   --  22  --   --   --   ALT 14  --   --   --   --   --   --   --   --   --   --   --  12  --   --   --   ALKPHOS 69  --   --   --   --   --   --   --   --   --   --   --  55  --   --   --   BILITOT 1.6*  --   --   --   --   --   --   --   --   --   --   --  0.4  --   --   --   PROT 6.7  --   --   --   --   --   --   --   --   --   --   --  4.2*  --   --   --   ALBUMIN 3.2*  --   --   --   --   --   --   --   --   --   --   --  1.3*  --   --   --   APTT  --   --   --  29  --   --   --   --   --   --   --   --   --   --   --   --   INR  --   --   --  1.31  --   --   --   --   --   --   --   --   --   --   --   --   LATICACIDVEN  --   --   < > 2.0  < > 1.3  --   --  1.1  --  1.1  --   --   --   --   --   TROPONINI  --   --   --   --   --   --   --   --  <0.30  --   --   --   --   --   --   --   PROBNP 3133.0*  --   --   --   --   --   --   --   --   --   --   --   --   --   --   --   PHART  --   --   --   --   < >  --   < > 7.297*  --  7.346*  --  7.429  --  7.334*  --   --   PCO2ART  --   --   --   --   < >  --   < > 38.6  --  37.1  --  28.4*  --  31.2*  --   --   PO2ART  --   --   --   --   < >  --   < > 109.0*  --  124.0*  --  116.0*  --  74.8*  --   --   < > = values in this interval not displayed.  Recent Labs Lab 01/03/13 1556 01/03/13 2054 01/04/13 0030 01/04/13 0358 01/04/13 0819  GLUCAP 103* 106* 83 79 71    Imaging: US Renal Port  01/02/2013  *RADIOLOGY REPORT*  Clinical Data: Acute renal insufficiency  RENAL/URINARY TRACT ULTRASOUND COMPLETE  Comparison:  CT 12/31/2012  Findings:  Right Kidney:  15 mm cyst right upper pole.  Negative for obstruction.  Renal cortex is normal.  Right kidney measures 9.8 cm in length.  Left Kidney:  9.8 cm in length.  Negative for obstruction.  Large upper pole cyst measures 5 x 6 cm.  Normal appearing renal cortex.  Gallbladder sludge and possible gallstones are noted.  Bladder:  Empty bladder.  IMPRESSION: Bilateral renal cysts.  Negative  for hydronephrosis  Gallbladder sludge and possible gallstones.   Original Report Authenticated By: Janeece Riggers, M.D.    Dg Chest Port 1 View  01/04/2013  *RADIOLOGY REPORT*  Clinical Data: Atelectasis, follow-up  PORTABLE CHEST - 1 VIEW  Comparison: Portable chest x-ray of 01/03/2013  Findings: The lungs are not quite as well aerated.  Diffuse airspace disease remains.  There may be small effusions present. Cardiomegaly is stable.  Endotracheal tube is no longer seen.  The left central venous line is unchanged in position and an NG tube extends below the hemidiaphragm.  IMPRESSION: Little change in poor aeration with diffuse airspace disease. Endotracheal tube is no longer seen.   Original Report Authenticated By: Dwyane Dee, M.D.    Dg Chest Port 1 View  01/03/2013  *RADIOLOGY REPORT*  Clinical Data: Follow-up atelectasis and pneumonia.  PORTABLE CHEST - 1 VIEW  Comparison: 01/02/2013  Findings: Endotracheal tube tip measures 5 cm above the carina. Enteric tube tip is at the field of view but below the left hemidiaphragm.  Left central venous catheter tip is directed horizontally in the mediastinum consistent location of the junction of the brachial cephalic and superior vena cava.  Shallow inspiration.  Borderline heart size and pulmonary vascularity. Mild perihilar infiltration.  Bilateral basilar atelectasis or infiltration.  Obscuration of the left hemidiaphragm suggest small effusion.  No pneumothorax.  Calcification of the aorta.  IMPRESSION: Increasing perihilar and basilar infiltration or atelectasis. Small left pleural effusion.  Changes may be due to increase congestion or pneumonia.   Original Report Authenticated By: Burman Nieves, M.D.                   ASSESSMENT / PLAN: Principal Problem:   SBO (small bowel obstruction) Active Problems:   PNA (pneumonia)   HTN (hypertension)   Hypoxia   Hyponatremia   Dehydration   Acute renal failure   Hyperglycemia   Acute respiratory  failure   Hypovolemic shock   Sepsis(995.91)   Acute blood loss anemia   PULMONARY A:   Suspected atelectasis LLL.   Hypoxemic respiratory failure after surgery.  P:  -oxygen as needed to keep SpO2 > 92% -CXR PRN -BDs -ABX per ID section -OOB if OK with CCS -Pt/OT consults    CARDIOVASCULAR A:  SIRS / sepsis - Shock state with afib rvr with low cvp: hypovolemic shock, doubt septic shock.  Lactate trending down: 1.1( 4/2).   Off pressors 4/03. A-Fib w/RVR  - noted 4/1, resolved 4/2 with Amio gtt.  Negative troponin.   P:  Amio IV for new onset A-fib/RVR 4/1 @30m  mg /hr Continue Zosyn  Hold Norvasc, ASA, Crestor, Avapro  RENAL  A:   Acute renal failure - in setting of intravascular depletion / ARF. ?? baseline renal function. No old labs in system.  Renal U/S with bilateral renal cysts, negativ hydronephrosis.  Marland Kitchen 4-4 creatine  continues to rise and urine output is down. cvp 17 Hypokalemia Hyponatremia Hypocalcemia Hypervolemia  P:   Replace electrolytes as needed Follow renal function  Decrease IV fluid Monitor serum sodium, slowly improving Low dose lasix 4/04   GASTROINTESTINAL A:   Distal SBO.   Suspected ischemic bowel.  S/p ex lap 4/1 with foreign body removal  P: Advance diet per CCS Post op care per CCS Protonix for GI Px   HEMATOLOGIC  A:   Hemoconcentration.   H/o DVT, factor V Leiden.  P:  Trend CBC Per CCS delay start of VTE agent >24h 2/2 surgical blood loss/ increased risk of bleed.  SCDs 4/1, consider start anti-coagulation when okay with surgery  INFECTIOUS A:   Suspected aspiration pneumonia 2/2 SBO >> E coli in sputum. Peritonitis.  P:  Continue Zosyn  Follow culture results   ENDOCRINE  CBG (last  3)   A:   Hyperglycemia - No h/o DM.  P:   CBG's and SSI   NEUROLOGIC A:   Hypersomnolence.  P:   Monitor mental status off sedation    Brett Canales Minor ACNP [facility] PCCM Pager [phone] till 3 pm If no answer  page [phone] 01/04/2013, 9:35 AM  Reviewed above, examined pt, and agree with assessment/plan.  Coralyn Helling, MD [facility] Pulmonary/Critical Care 01/04/2013, 10:32 AM Pager:  [phone] After 3pm call: [phone]

## 2013-01-04 NOTE — Progress Notes (Signed)
3 Days Post-Op  Subjective: She is awake and tries to talk, but she is very difficult to understand, very weak, but can follow commands.  Says she's thirsty.  Objective: Vital signs in last 24 hours: Temp:  [98.6 F (37 C)-101.3 F (38.5 C)] 98.7 F (37.1 C) (04/04 0400) Pulse Rate:  [66-100] 87 (04/04 0700) Resp:  [17-28] 17 (04/04 0700) BP: (106-138)/(26-69) 116/41 mmHg (04/04 0700) SpO2:  [90 %-100 %] 92 % (04/04 0700) FiO2 (%):  [30 %-35 %] 35 % (04/04 0500) Weight:  [164 lb 7.4 oz (74.6 kg)] 164 lb 7.4 oz (74.6 kg) (04/04 0500) Last BM Date: 01/03/13  450/NG  2 stools recorded, NPO Tm 101.3, vss, WBC is up and Creatinine is still up  Intake/Output from previous day: 04/03 0701 - 04/04 0700 In: 3876.3 [I.V.:3266.3; NG/GT:60; IV Piggyback:550] Out: 1100 [Urine:650; Emesis/NG output:450] Intake/Output this shift:    General appearance: alert and somewhat confused, thick difficulty speech, slow mentation. Resp: rales bilaterally and decreased BS at based. Abdomen:  No distension, no BS, incision looks OK. She did have a BM recorded yesterday.   Lab Results:   Recent Labs  01/03/13 0344 01/04/13 0414  WBC 11.5* 11.0*  HGB 10.3* 11.8*  HCT 28.9* 34.8*  PLT 142* 107*    BMET  Recent Labs  01/03/13 1620 01/04/13 0414  NA 133* 134*  K 3.9 3.6  CL 102 103  CO2 17* 16*  GLUCOSE 102* 88  BUN 75* 75*  CREATININE 3.05* 3.11*  CALCIUM 6.4* 7.0*   PT/INR No results found for this basename: LABPROT, INR,  in the last 72 hours   Recent Labs Lab 12/31/12 1940 01/03/13 0344  AST 26 22  ALT 14 12  ALKPHOS 69 55  BILITOT 1.6* 0.4  PROT 6.7 4.2*  ALBUMIN 3.2* 1.3*     Lipase     Component Value Date/Time   LIPASE 162* 12/31/2012 1940     Studies/Results: US Renal Port  01/02/2013  *RADIOLOGY REPORT*  Clinical Data: Acute renal insufficiency  RENAL/URINARY TRACT ULTRASOUND COMPLETE  Comparison:  CT 12/31/2012  Findings:  Right Kidney:  15 mm cyst right  upper pole.  Negative for obstruction.  Renal cortex is normal.  Right kidney measures 9.8 cm in length.  Left Kidney:  9.8 cm in length.  Negative for obstruction.  Large upper pole cyst measures 5 x 6 cm.  Normal appearing renal cortex.  Gallbladder sludge and possible gallstones are noted.  Bladder:  Empty bladder.  IMPRESSION: Bilateral renal cysts.  Negative for hydronephrosis  Gallbladder sludge and possible gallstones.   Original Report Authenticated By: Janeece Riggers, M.D.    Dg Chest Port 1 View  01/04/2013  *RADIOLOGY REPORT*  Clinical Data: Atelectasis, follow-up  PORTABLE CHEST - 1 VIEW  Comparison: Portable chest x-ray of 01/03/2013  Findings: The lungs are not quite as well aerated.  Diffuse airspace disease remains.  There may be small effusions present. Cardiomegaly is stable.  Endotracheal tube is no longer seen.  The left central venous line is unchanged in position and an NG tube extends below the hemidiaphragm.  IMPRESSION: Little change in poor aeration with diffuse airspace disease. Endotracheal tube is no longer seen.   Original Report Authenticated By: Dwyane Dee, M.D.    Dg Chest Port 1 View  01/03/2013  *RADIOLOGY REPORT*  Clinical Data: Follow-up atelectasis and pneumonia.  PORTABLE CHEST - 1 VIEW  Comparison: 01/02/2013  Findings: Endotracheal tube tip measures 5 cm above the  carina. Enteric tube tip is at the field of view but below the left hemidiaphragm.  Left central venous catheter tip is directed horizontally in the mediastinum consistent location of the junction of the brachial cephalic and superior vena cava.  Shallow inspiration.  Borderline heart size and pulmonary vascularity. Mild perihilar infiltration.  Bilateral basilar atelectasis or infiltration.  Obscuration of the left hemidiaphragm suggest small effusion.  No pneumothorax.  Calcification of the aorta.  IMPRESSION: Increasing perihilar and basilar infiltration or atelectasis. Small left pleural effusion.  Changes may  be due to increase congestion or pneumonia.   Original Report Authenticated By: Burman Nieves, M.D.     Medications: . antiseptic oral rinse  15 mL Mouth Rinse QID  . chlorhexidine  15 mL Mouth Rinse BID  . heparin subcutaneous  5,000 Units Subcutaneous Q8H  . insulin aspart  0-15 Units Subcutaneous Q4H  . pantoprazole (PROTONIX) IV  40 mg Intravenous Q24H  . piperacillin-tazobactam (ZOSYN)  IV  2.25 g Intravenous Q6H  . sodium chloride  1,000 mL Intravenous Once   . sodium chloride 125 mL/hr at 01/04/13 0105  . amiodarone (NEXTERONE PREMIX) 360 mg/200 mL dextrose 30 mg/hr (01/03/13 2246)  . phenylephrine (NEO-SYNEPHRINE) Adult infusion Stopped (01/03/13 1400)    Assessment/Plan Bowel obstruction and pneumatosis.  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 Dr. Biagio Quint Acute respiratory failure/Extubated with FM/Hypoxia improving Leukocytosis HTN (hypertension)  BP is stable On Amiodarone drip, looks like sinus on monitor currently Hyponatremia  Dehydration  Acute renal failure  Hyperglycemia  Hx of DVT Factor V leiden Dysphonia   Plan:  Urine output is minimal, I/O shows  Her to be 9 liters positive since admit,  I do not see anything that shows any cardiac function studies.  I will leave fluids and renal Rx to CCM. She is extremely frail.  I will try some ice chips with supervision and sitting up, but we will need a swallowing study before we advance her .  We could start some clamping trials with her NG, but I'm hesitant to do it now.  I would rather see her more alert and able to protect her air way before giving her much more.       LOS: 4 days    Meleah Demeyer 01/04/2013

## 2013-01-05 ENCOUNTER — Inpatient Hospital Stay (HOSPITAL_COMMUNITY): Payer: Medicare Other

## 2013-01-05 DIAGNOSIS — I359 Nonrheumatic aortic valve disorder, unspecified: Secondary | ICD-10-CM

## 2013-01-05 LAB — GLUCOSE, CAPILLARY
Glucose-Capillary: 105 mg/dL — ABNORMAL HIGH (ref 70–99)
Glucose-Capillary: 112 mg/dL — ABNORMAL HIGH (ref 70–99)

## 2013-01-05 LAB — BASIC METABOLIC PANEL
BUN: 78 mg/dL — ABNORMAL HIGH (ref 6–23)
GFR calc Af Amer: 14 mL/min — ABNORMAL LOW (ref >90)
GFR calc non Af Amer: 12 mL/min — ABNORMAL LOW (ref >90)
Potassium: 4.3 mEq/L (ref 3.5–5.1)

## 2013-01-05 LAB — CBC
HCT: 34.6 % — ABNORMAL LOW (ref 36.0–46.0)
MCHC: 33.2 g/dL (ref 30.0–36.0)
RDW: 15.3 % (ref 11.5–15.5)

## 2013-01-05 LAB — PROCALCITONIN: Procalcitonin: 15.42 ng/mL

## 2013-01-05 MED ORDER — FUROSEMIDE 10 MG/ML IJ SOLN
40.0000 mg | Freq: Two times a day (BID) | INTRAMUSCULAR | Status: DC
  Administered 2013-01-05 (×2): 40 mg via INTRAVENOUS
  Filled 2013-01-05 (×4): qty 4

## 2013-01-05 NOTE — Progress Notes (Signed)
4 Days Post-Op  Subjective: She is awake but still very weak, but can follow commands.  Says she's having some abd pain but denies nausea.  Objective: Vital signs in last 24 hours: Temp:  [96.9 F (36.1 C)-99 F (37.2 C)] 96.9 F (36.1 C) (04/05 0400) Pulse Rate:  [64-91] 86 (04/05 0700) Resp:  [17-21] 19 (04/05 0700) BP: (105-150)/(37-67) 138/49 mmHg (04/05 0700) SpO2:  [89 %-96 %] 95 % (04/05 0700) FiO2 (%):  [35 %] 35 % (04/05 0400) Weight:  [170 lb 3.1 oz (77.2 kg)] 170 lb 3.1 oz (77.2 kg) (04/05 0400) Last BM Date: 01/04/13   Intake/Output from previous day: 04/04 0701 - 04/05 0700 In: 2199.1 [I.V.:1849.1; IV Piggyback:350] Out: 2020 [Urine:1820; Emesis/NG output:200] Intake/Output this shift:    General appearance: alert and somewhat confused, thick difficulty speech, slow mentation. Resp: rales bilaterally and decreased BS at based. Abdomen:  Min distension, incision looks OK. She did have a couple small BMs yesterday.   Lab Results:   Recent Labs  01/04/13 0414 01/05/13 0453  WBC 11.0* 13.6*  HGB 11.8* 11.5*  HCT 34.8* 34.6*  PLT 107* 113*    BMET  Recent Labs  01/04/13 0414 01/05/13 0453  NA 134* 136  K 3.6 4.3  CL 103 103  CO2 16* 14*  GLUCOSE 88 109*  BUN 75* 78*  CREATININE 3.11* 3.35*  CALCIUM 7.0* 8.1*   PT/INR No results found for this basename: LABPROT, INR,  in the last 72 hours   Recent Labs Lab 12/31/12 1940 01/03/13 0344  AST 26 22  ALT 14 12  ALKPHOS 69 55  BILITOT 1.6* 0.4  PROT 6.7 4.2*  ALBUMIN 3.2* 1.3*     Lipase     Component Value Date/Time   LIPASE 162* 12/31/2012 1940     Studies/Results: Dg Chest Port 1 View  01/05/2013  *RADIOLOGY REPORT*  Clinical Data: Hypertension, atelectasis.  PORTABLE CHEST - 1 VIEW  Comparison:   the previous day's study  Findings: Nasogastric tube and left IJ central line are stable in position.  Atheromatous aorta.  Heart size normal.  Small left pleural effusion.  Persistent  airspace consolidation in the left lower lung.  Patchy alveolar opacities in the left upper lung. Moderate interstitial opacities throughout the right lung. Regional bones unremarkable.  IMPRESSION: 1. Stable asymmetric infiltrates left greater than right. 2. Support hardware stable in position.   Original Report Authenticated By: D. Andria Rhein, MD    Dg Chest Port 1 View  01/04/2013  *RADIOLOGY REPORT*  Clinical Data: Atelectasis, follow-up  PORTABLE CHEST - 1 VIEW  Comparison: Portable chest x-ray of 01/03/2013  Findings: The lungs are not quite as well aerated.  Diffuse airspace disease remains.  There may be small effusions present. Cardiomegaly is stable.  Endotracheal tube is no longer seen.  The left central venous line is unchanged in position and an NG tube extends below the hemidiaphragm.  IMPRESSION: Little change in poor aeration with diffuse airspace disease. Endotracheal tube is no longer seen.   Original Report Authenticated By: Dwyane Dee, M.D.     Medications: . antiseptic oral rinse  15 mL Mouth Rinse QID  . chlorhexidine  15 mL Mouth Rinse BID  . heparin subcutaneous  5,000 Units Subcutaneous Q8H  . insulin aspart  0-15 Units Subcutaneous Q4H  . pantoprazole (PROTONIX) IV  40 mg Intravenous Q24H  . piperacillin-tazobactam (ZOSYN)  IV  2.25 g Intravenous Q6H  . sodium chloride  1,000 mL Intravenous  Once   . sodium chloride 50 mL/hr at 01/04/13 1100  . amiodarone (NEXTERONE PREMIX) 360 mg/200 mL dextrose 30 mg/hr (01/04/13 2054)    Assessment/Plan Bowel obstruction and pneumatosis.  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 Dr. Biagio Quint Acute respiratory failure/Extubated with FM/Hypoxia improving Leukocytosis HTN (hypertension)  BP is stable On Amiodarone drip, looks like sinus on monitor currently Hyponatremia  Dehydration  Acute renal failure  Hyperglycemia  Hx of DVT Factor V leiden Dysphonia   Plan:  Urine  output a little better with lasix. Cr still trending up slightly. I will leave fluids and renal Rx to CCM.  So is tolerating some ice chips with supervision and sitting up.  I will d/c ng to help with this.  Can do sips of clears with supervision.         LOS: 5 days    Tinlee Navarrette C. 01/05/2013

## 2013-01-05 NOTE — Evaluation (Signed)
Clinical/Bedside Swallow Evaluation Patient Details  Name: [name] MRN: [id number] Date of Birth: [birth year]  Today's Date: 01/05/2013 Time: 1201-1223 SLP Time Calculation (min): 22 min  Past Medical History:  Past Medical History  Diagnosis Date  . Osteoporosis   . DVT (deep venous thrombosis)   . Factor V Leiden   . Hypertension   . Dysphonia    Past Surgical History:  Past Surgical History  Procedure Laterality Date  . Tonsillectomy    . Appendectomy    . Abdominal hysterectomy    . Laparotomy N/A 01/01/2013    Procedure: EXPLORATORY LAPAROTOMY removal of smal bowel foreign body;  Surgeon: Lodema Pilot, DO;  Location: [facility];  Service: General;  Laterality: N/A;   HPI:  79 yo female adm to [facility] 12/31/12 with N/V found to have SBO, S/p exploratoy lap with pt requiring ventilator support, placed on ARDS protocol.  Pt now extubated and bedside swallow evaluation was ordered prior to pt initiating clear liquids.  Pt with hypoxemia and LLL airspace disease.   Pt intubated 4/1-4/3, now off vent on nasal cannula.  Pt is weak but will follow some commands.     Assessment / Plan / Recommendation Clinical Impression  Pt presents with clinical indications of moderate oral and suspected pharyngeal dysphagia that is likely exacerbated by pt's lethargy and gross weakness.    Pt with wet gurgly voice and phonation, ? aspiration of secretions that she is unable to clear- pt DID NOT cough on command.  Tongue with coating that SLP unable to fully clear with moist toothettes.  Pt admits to "some coughing" with food/drink prior to admission.   ? previous cleft lip or lip surgery.  Pt given small amount of water via tsp, ice via tsp - delayed oral transit and swallow noted with throat clearing - ? Aspiration.    Rec pt remain npo except small amounts of single ice boluses and water for moisture - after oral care.  Hopeful for pt to improve with improved mental status, clear phonation abilities and  medical progression.    Pt educated and agreeable to plan.      Aspiration Risk       Diet Recommendation Ice chips PRN after oral care;NPO   Medication Administration: Via alternative means    Other  Recommendations Oral Care Recommendations: Oral care QID   Follow Up Recommendations   (TBD)    Frequency and Duration min 2x/week      Pertinent Vitals/Pain Afebrile, decreased     SLP Swallow Goals Patient will utilize recommended strategies during swallow to increase swallowing safety with: Total assistance Goal #3: Pt will demonstrate clear phonation abilities with cough to clear if voice is wet with moderate assistance to aid in determining readiness for po.    Swallow Study Prior Functional Status   ? Dysphagia PTA, dysarthric speech    General Date of Onset: 01/05/13 HPI: 79 yo female adm to [facility] 12/31/12 with N/V found to have SBO, S/p exploratoy lap with pt requiring ventilator support, placed on ARDS protocol.  Pt now extubated and bedside swallow evaluation was ordered prior to pt initiating clear liquids.  Pt with hypoxemia and LLL airspace disease.   Pt intubated 4/1-4/3, now off vent on nasal cannula.  Pt is weak but will follow some commands.   Type of Study: Bedside swallow evaluation Diet Prior to this Study: NPO Respiratory Status: Supplemental O2 delivered via (comment) History of Recent Intubation: Yes Length of Intubations (days): 3  days Date extubated: 01/03/13 Behavior/Cognition: Lethargic;Decreased sustained attention;Cooperative;Requires cueing Oral Cavity - Dentition:  (upper denture (ST did not locate in room), few lower teeth) Self-Feeding Abilities: Total assist Patient Positioning: Upright in bed Baseline Vocal Quality: Hoarse;Low vocal intensity Volitional Cough: Weak Volitional Swallow: Able to elicit    Oral/Motor/Sensory Function Overall Oral Motor/Sensory Function: Impaired (generalized weakness, no focal deficits, ? prior cleft lip?)   Ice  Chips Ice chips: Impaired Oral Phase Impairments: Impaired anterior to posterior transit;Reduced lingual movement/coordination Oral Phase Functional Implications: Prolonged oral transit Pharyngeal Phase Impairments: Suspected delayed Swallow;Wet Vocal Quality   Thin Liquid Thin Liquid: Impaired Presentation: Spoon Oral Phase Impairments: Reduced lingual movement/coordination;Impaired anterior to posterior transit Oral Phase Functional Implications: Prolonged oral transit Pharyngeal  Phase Impairments: Suspected delayed Swallow;Throat Clearing - Immediate    Nectar Thick Nectar Thick Liquid: Not tested   Honey Thick Honey Thick Liquid: Not tested   Puree Puree: Not tested   Solid   GO    Solid: Not tested       Donavan Burnet, MS [facility] SLP [phone]

## 2013-01-05 NOTE — Progress Notes (Signed)
  Echocardiogram 2D Echocardiogram has been performed.  [name] 01/05/2013, 11:46 AM

## 2013-01-05 NOTE — Progress Notes (Addendum)
PULMONARY  / CRITICAL CARE MEDICINE  Name: [name] MRN: [id number] DOB: 1929-01-25    ADMISSION DATE:  12/31/2012 CONSULTATION DATE:  12/31/2012  REFERRING MD :  EDP PRIMARY SERVICE:  PCCM  CHIEF COMPLAINT:  Abdominal pain/ SBO  BRIEF PATIENT DESCRIPTION: 79 y/o without significant medical problems who developed abdominal pain, nausea, and vomiting 3 days prior to presentation to [facility] ED.  Found to have high grade SBO with pneumatosis and portal venous gas. Exploratory Lap early am 01/01/13, found foreign body imbedded in Small Bowel ?Bezoar. Pt. with hypoxia with LLL airspace disease.  SIGNIFICANT EVENTS / STUDIES:  3/31 - Presented with nausea, vomiting, abdominal pain 3/31 -  Abdominal CT >>> High grade distal SBO and evidence of bowel ischemia 4/01 - Exploratory Lap,  Placed on ARDS Protocol, a fib with RVR >> amiodarone added 4/03 - d/c ARDS protocol 01/04/13 - Weaning pressors.  In sinus rhythm.  Extubated  LINES / TUBES: NGT 3/31 >>> Foley 3/31 >>> ETT 01/01/13>>>01/04/13 L IJ CVC 01/02/13>>>  CULTURES: 4/2>>> Sputum 4/2>>>Blood 4/2>>> Urine  ANTIBIOTICS: Zosyn 3/31 (abd coverage)>>>  INTERVAL HISTORY: 01/05/13 - improved ur op but still only marginal per RN. Very deconditioned and sleepy but not confused. Off pressors and extubated    VITAL SIGNS: Temp:  [96.9 F (36.1 C)-99 F (37.2 C)] 97.1 F (36.2 C) (04/05 0800) Pulse Rate:  [64-88] 82 (04/05 0800) Resp:  [17-21] 18 (04/05 0800) BP: (105-150)/(37-59) 145/48 mmHg (04/05 0800) SpO2:  [90 %-96 %] 95 % (04/05 0800) FiO2 (%):  [35 %] 35 % (04/05 0800) Weight:  [77.2 kg (170 lb 3.1 oz)] 77.2 kg (170 lb 3.1 oz) (04/05 0400)  HEMODYNAMICS: CVP:  [8 mmHg-38 mmHg] 37 mmHg   VENTILATOR SETTINGS: Vent Mode:  [-]  FiO2 (%):  [35 %] 35 %  INTAKE / OUTPUT: Intake/Output     04/04 0701 - 04/05 0700 04/05 0701 - 04/06 0700   I.V. (mL/kg) 1849.1 (24) 66.7 (0.9)   NG/GT     IV Piggyback 350    Total Intake(mL/kg)  2199.1 (28.5) 66.7 (0.9)   Urine (mL/kg/hr) 1820 (1) 40 (0.2)   Emesis/NG output 200 (0.1)    Stool  1 (0)   Total Output 2020 41   Net +179.1 +25.7        Stool Occurrence 2 x     PHYSICAL EXAMINATION: General:  Intubated, sedated Neuro:  MAEx4,  HEENT:  PERRL, NGT, dry mucous membranes Cardiovascular:  NSR on Amiodarone ( started 4/1), No RMG Lungs:  Bilaterally diminished L>R  Abdomen:  Distended, hypoactive BS, bilious drainage per NG. Musculoskeletal:  Moves all extremities, no edema noted Skin:  Intact  LABS:  Recent Labs Lab 12/31/12 1940 12/31/12 1950  01/01/13 0100  01/01/13 1745  01/01/13 2202 01/01/13 2300 01/02/13 0015 01/02/13 0455 01/02/13 0945 01/03/13 0344 01/03/13 0950 01/03/13 1620 01/04/13 0414 01/05/13 0453  HGB 16.5* 17.3*  --   --   < >  --   --   --   --   --  12.3  --  10.3*  --   --  11.8* 11.5*  WBC 9.8  --   --   --   < >  --   --   --   --   --  11.7*  --  11.5*  --   --  11.0* 13.6*  PLT 221  --   --   --   < >  --   --   --   --   --  199  --  142*  --   --  107* 113*  NA 125* 123*  --  125*  < >  --   --   --   --   --  129*  --  131*  --  133* 134* 136  K 3.8 7.3*  --  4.0  < >  --   --   --   --   --  3.7  --  2.8*  --  3.9 3.6 4.3  CL 78* 85*  --  82*  < >  --   --   --   --   --  95*  --  98  --  102 103 103  CO2 28  --   --  31  < >  --   --   --   --   --  23  --  18*  --  17* 16* 14*  GLUCOSE 175* 181*  --  159*  < >  --   --   --   --   --  151*  --  115*  --  102* 88 109*  BUN 79* 131*  --  81*  < >  --   --   --   --   --  79*  --  77*  --  75* 75* 78*  CREATININE 2.49* 2.90*  --  2.65*  < >  --   --   --   --   --  3.04*  --  3.01*  --  3.05* 3.11* 3.35*  CALCIUM 9.2  --   --  7.9*  < >  --   --   --   --   --  6.4*  --  6.3*  --  6.4* 7.0* 8.1*  MG  --   --   --  1.9  --   --   --   --   --   --  1.7  --   --   --   --   --  1.8  PHOS  --   --   --  5.0*  --   --   --   --   --   --  5.4*  --   --   --   --   --  7.0*  AST  26  --   --   --   --   --   --   --   --   --   --   --  22  --   --   --   --   ALT 14  --   --   --   --   --   --   --   --   --   --   --  12  --   --   --   --   ALKPHOS 69  --   --   --   --   --   --   --   --   --   --   --  55  --   --   --   --   BILITOT 1.6*  --   --   --   --   --   --   --   --   --   --   --  0.4  --   --   --   --  PROT 6.7  --   --   --   --   --   --   --   --   --   --   --  4.2*  --   --   --   --   ALBUMIN 3.2*  --   --   --   --   --   --   --   --   --   --   --  1.3*  --   --   --   --   APTT  --   --   --  29  --   --   --   --   --   --   --   --   --   --   --   --   --   INR  --   --   --  1.31  --   --   --   --   --   --   --   --   --   --   --   --   --   LATICACIDVEN  --   --   < > 2.0  < > 1.3  --   --  1.1  --  1.1  --   --   --   --   --   --   TROPONINI  --   --   --   --   --   --   --   --  <0.30  --   --   --   --   --   --   --   --   PROBNP 3133.0*  --   --   --   --   --   --   --   --   --   --   --   --   --   --   --   --   PHART  --   --   --   --   < >  --   < > 7.297*  --  7.346*  --  7.429  --  7.334*  --   --   --   PCO2ART  --   --   --   --   < >  --   < > 38.6  --  37.1  --  28.4*  --  31.2*  --   --   --   PO2ART  --   --   --   --   < >  --   < > 109.0*  --  124.0*  --  116.0*  --  74.8*  --   --   --   < > = values in this interval not displayed.  Recent Labs Lab 01/04/13 1634 01/04/13 1949 01/04/13 2327 01/05/13 0331 01/05/13 0744  GLUCAP 86 91 86 100* 105*    Imaging: Dg Chest Port 1 View  01/05/2013  *RADIOLOGY REPORT*  Clinical Data: Hypertension, atelectasis.  PORTABLE CHEST - 1 VIEW  Comparison:   the previous day's study  Findings: Nasogastric tube and left IJ central line are stable in position.  Atheromatous aorta.  Heart size normal.  Small left pleural effusion.  Persistent airspace consolidation in the left lower lung.  Patchy alveolar opacities in the left upper lung. Moderate interstitial opacities  throughout the right lung. Regional bones unremarkable.  IMPRESSION: 1. Stable asymmetric infiltrates left greater than right. 2. Support hardware stable in position.   Original Report Authenticated By: D. Andria Rhein, MD    Dg Chest Port 1 View  01/04/2013  *RADIOLOGY REPORT*  Clinical Data: Atelectasis, follow-up  PORTABLE CHEST - 1 VIEW  Comparison: Portable chest x-ray of 01/03/2013  Findings: The lungs are not quite as well aerated.  Diffuse airspace disease remains.  There may be small effusions present. Cardiomegaly is stable.  Endotracheal tube is no longer seen.  The left central venous line is unchanged in position and an NG tube extends below the hemidiaphragm.  IMPRESSION: Little change in poor aeration with diffuse airspace disease. Endotracheal tube is no longer seen.   Original Report Authenticated By: Dwyane Dee, M.D.                   ASSESSMENT / PLAN: Principal Problem:   SBO (small bowel obstruction) Active Problems:   PNA (pneumonia)   HTN (hypertension)   Hypoxia   Hyponatremia   Dehydration   Acute renal failure   Hyperglycemia   Acute respiratory failure   Hypovolemic shock   Sepsis(995.91)   Acute blood loss anemia   PULMONARY A:   Suspected atelectasis LLL.   Hypoxemic respiratory failure after surgery.  01/05/13 - maintaining resp. Status on face mask but looks like she can be weaned down to nasal cannula  P: Aggressive pulm toilet, OOB to chair,  - wean to nasal cannula -CXR PRN -BDs -ABX per ID section    CARDIOVASCULAR  Recent Labs Lab 12/31/12 1940  PROBNP 3133.0*     Recent Labs Lab 01/01/13 2300  TROPONINI <0.30    A:  SIRS / sepsis - Shock state with afib rvr with low cvp: hypovolemic shock, doubt septic shock.  Lactate trending down: 1.1( 4/2).    A-Fib w/RVR  - noted 4/1, resolved 4/2 with Amio gtt.  Negative troponin.   01/03/13 - Sinus 01/05/13 - appears volume overloaded and still on amio with HR 85  Sinus on EKG P:  will  dc amio Diurese Check bnp and echo and get serial enzymes Hold Norvasc, ASA, Crestor, Avapro; restrt when able and can take po  RENAL  Recent Labs Lab 12/31/12 1950 01/01/13 0100  01/02/13 0455 01/03/13 0344 01/03/13 1620 01/04/13 0414 01/05/13 0453  NA 123* 125*  < > 129* 131* 133* 134* 136  K 7.3* 4.0  < > 3.7 2.8* 3.9 3.6 4.3  CL 85* 82*  < > 95* 98 102 103 103  CO2  --  31  < > 23 18* 17* 16* 14*  GLUCOSE 181* 159*  < > 151* 115* 102* 88 109*  BUN 131* 81*  < > 79* 77* 75* 75* 78*  CREATININE 2.90* 2.65*  < > 3.04* 3.01* 3.05* 3.11* 3.35*  CALCIUM  --  7.9*  < > 6.4* 6.3* 6.4* 7.0* 8.1*  MG  --  1.9  --  1.7  --   --   --  1.8  PHOS  --  5.0*  --  5.4*  --   --   --  7.0*  < > = values in this interval not displayed. Intake/Output     04/04 0701 - 04/05 0700 04/05 0701 - 04/06 0700   I.V. (mL/kg) 1849.1 (24) 66.7 (0.9)   NG/GT     IV Piggyback 350    Total Intake(mL/kg) 2199.1 (28.5) 66.7 (0.9)   Urine (mL/kg/hr) 1820 (1)  40 (0.2)   Emesis/NG output 200 (0.1)    Stool  1 (0)   Total Output 2020 41   Net +179.1 +25.7        Stool Occurrence 2 x      A:   Acute renal failure - in setting of intravascular depletion / ARF. ?? baseline renal function. No old labs in system.  Renal U/S with bilateral renal cysts, negative hydronephrosis.  01/05/13  - Improving Ur OP but rising creatinine but rate of rise declining. Volume overloaded +  P:  ' Start lasix bid on 01/05/13    GASTROINTESTINAL  Recent Labs Lab 12/31/12 1940 01/01/13 0100 01/03/13 0344  AST 26  --  22  ALT 14  --  12  ALKPHOS 69  --  55  BILITOT 1.6*  --  0.4  PROT 6.7  --  4.2*  ALBUMIN 3.2*  --  1.3*  INR  --  1.31  --     A:   Distal SBO.   Suspected ischemic bowel.  S/p ex lap 4/1 with foreign body removal  01/05/13  - CCS has asked for OG tube removal and recs clears  P: ppi Will ask speech to clear her swallow before giving clears  HEMATOLOGIC  Recent Labs Lab 01/03/13 0344  01/04/13 0414 01/05/13 0453  HGB 10.3* 11.8* 11.5*  HCT 28.9* 34.8* 34.6*  WBC 11.5* 11.0* 13.6*  PLT 142* 107* 113*    Recent Labs Lab 01/01/13 0100  INR 1.31    A:   H/o DVT, factor V Leiden. Anemia of critical illness THrombocytopenia of critical illness  P:  SQ heparin since 01/01/13 for dvt prph dose PRBC for hgb < 7gm% unless actively bleeding  INFECTIOUS  Recent Labs Lab 01/01/13 1745 01/01/13 2300 01/02/13 0455  LATICACIDVEN 1.3 1.1 1.1    Recent Labs Lab 01/01/13 0350 01/02/13 0455 01/03/13 0344 01/04/13 0414 01/05/13 0453  WBC 7.6 11.7* 11.5* 11.0* 13.6*     A:   Suspected aspiration pneumonia 2/2 SBO  P:  Continue Zosyn  Check PCT algorthm to decide abx stop dtate  ENDOCRINE  CBG (last 3)   Recent Labs  01/04/13 2327 01/05/13 0331 01/05/13 0744  GLUCAP 86 100* 105*     A:   Hyperglycemia - No h/o DM.   P:   CBG's and SSI   NEUROLOGIC A:   01/05/13 - deconditioned. RASS -1 but CAM-ICU neg for delirium  P:   Dc all fentanyl       GLOBAL 4/51/4 - no family at bedside. Might need inptient rehab      Dr. Kalman Shan, M.D., [facility]C.P Pulmonary and Critical Care Medicine Staff Physician [facility] Pager: [phone], If no answer or between  15:00h - 7:00h: call [phone]  01/05/2013 9:49 AM

## 2013-01-06 LAB — GLUCOSE, CAPILLARY
Glucose-Capillary: 114 mg/dL — ABNORMAL HIGH (ref 70–99)
Glucose-Capillary: 88 mg/dL (ref 70–99)
Glucose-Capillary: 113 mg/dL — ABNORMAL HIGH (ref 70–99)

## 2013-01-06 LAB — BLOOD GAS, ARTERIAL
Bicarbonate: 12.8 mEq/L — ABNORMAL LOW (ref 20.0–24.0)
Delivery systems: POSITIVE
FIO2: 0.4 %
O2 Saturation: 95.8 %
Patient temperature: 98.6
TCO2: 12 mmol/L (ref 0–100)

## 2013-01-06 LAB — BASIC METABOLIC PANEL
BUN: 84 mg/dL — ABNORMAL HIGH (ref 6–23)
CO2: 15 mEq/L — ABNORMAL LOW (ref 19–32)
Chloride: 103 mEq/L (ref 96–112)
GFR calc Af Amer: 12 mL/min — ABNORMAL LOW (ref >90)
Potassium: 4 mEq/L (ref 3.5–5.1)

## 2013-01-06 LAB — PROCALCITONIN: Procalcitonin: 12.41 ng/mL

## 2013-01-06 LAB — PRO B NATRIURETIC PEPTIDE: Pro B Natriuretic peptide (BNP): 6137 pg/mL — ABNORMAL HIGH (ref 0–450)

## 2013-01-06 LAB — PHOSPHORUS: Phosphorus: 7.5 mg/dL — ABNORMAL HIGH (ref 2.3–4.6)

## 2013-01-06 MED ORDER — SODIUM CHLORIDE 0.9 % IJ SOLN
10.0000 mL | Freq: Two times a day (BID) | INTRAMUSCULAR | Status: DC
  Administered 2013-01-06 – 2013-01-07 (×3): 10 mL
  Administered 2013-01-07 – 2013-01-08 (×2): 30 mL
  Administered 2013-01-10 – 2013-01-27 (×22): 10 mL

## 2013-01-06 MED ORDER — SODIUM CHLORIDE 0.9 % IJ SOLN
10.0000 mL | INTRAMUSCULAR | Status: DC | PRN
  Administered 2013-01-07 (×2): 10 mL
  Administered 2013-01-07: 20 mL
  Administered 2013-01-09 – 2013-01-21 (×11): 10 mL
  Administered 2013-01-22: 20 mL
  Administered 2013-01-22 – 2013-01-27 (×10): 10 mL

## 2013-01-06 MED ORDER — PIPERACILLIN-TAZOBACTAM IN DEX 2-0.25 GM/50ML IV SOLN
2.2500 g | Freq: Three times a day (TID) | INTRAVENOUS | Status: AC
  Administered 2013-01-06 – 2013-01-10 (×13): 2.25 g via INTRAVENOUS
  Filled 2013-01-06 (×16): qty 50

## 2013-01-06 MED ORDER — FUROSEMIDE 10 MG/ML IJ SOLN
40.0000 mg | Freq: Three times a day (TID) | INTRAMUSCULAR | Status: DC
  Administered 2013-01-06 – 2013-01-07 (×2): 40 mg via INTRAVENOUS
  Filled 2013-01-06 (×3): qty 4

## 2013-01-06 MED ORDER — SODIUM CHLORIDE 0.9 % IV SOLN
INTRAVENOUS | Status: DC | PRN
  Administered 2013-01-09: 250 mL via INTRAVENOUS
  Administered 2013-01-12: 23:00:00 via INTRAVENOUS
  Administered 2013-01-14: 10 mL/h via INTRAVENOUS

## 2013-01-06 MED ORDER — CIPROFLOXACIN HCL 0.3 % OP SOLN
2.0000 [drp] | OPHTHALMIC | Status: DC
  Administered 2013-01-06 – 2013-01-28 (×104): 2 [drp] via OPHTHALMIC
  Filled 2013-01-06 (×5): qty 2.5

## 2013-01-06 NOTE — Progress Notes (Signed)
Dr. Herma Carson. In elink notified that pt's right eye has a purulent -looking film floating over it and the sclera is bright pink.[name]

## 2013-01-06 NOTE — Progress Notes (Signed)
Pt is refusing BIPAP at this time, Pt is on 2 LPM [place] and tolerating well, pt is alert and oriented.  RT to monitor and assess as needed.

## 2013-01-06 NOTE — Progress Notes (Signed)
5 Days Post-Op  Subjective: She is awake but looks a little better than yesterday.  She can follow commands.  Says she's having some abd pain but denies nausea.  Discussed possible need for dialysis.  Pt does not wish to have dialysis, but I do not think that she fully understands the situation.  Objective: Vital signs in last 24 hours: Temp:  [97.1 F (36.2 C)-98.9 F (37.2 C)] 98.9 F (37.2 C) (04/06 0400) Pulse Rate:  [44-97] 92 (04/06 0700) Resp:  [17-21] 19 (04/06 0700) BP: (117-154)/(37-59) 120/46 mmHg (04/06 0700) SpO2:  [93 %-98 %] 97 % (04/06 0700) FiO2 (%):  [35 %] 35 % (04/05 0800) Weight:  [171 lb 4.8 oz (77.7 kg)] 171 lb 4.8 oz (77.7 kg) (04/06 0500) Last BM Date: 01/06/13  Intake/Output from previous day: 04/05 0701 - 04/06 0700 In: 1360.1 [I.V.:1210.1; IV Piggyback:150] Out: 1114 [Urine:1110; Stool:4] Intake/Output this shift:   General appearance: alert and less confused, thick difficulty speech, slow mentation. Resp: rales bilaterally and decreased BS at based. Abdomen:  Min distension, nontender Inc: clean, dry and intact  Lab Results:   Recent Labs  01/04/13 0414 01/05/13 0453  WBC 11.0* 13.6*  HGB 11.8* 11.5*  HCT 34.8* 34.6*  PLT 107* 113*    BMET  Recent Labs  01/05/13 0453 01/06/13 0500  NA 136 135  K 4.3 4.0  CL 103 103  CO2 14* 15*  GLUCOSE 109* 115*  BUN 78* 84*  CREATININE 3.35* 3.83*  CALCIUM 8.1* 8.7   PT/INR No results found for this basename: LABPROT, INR,  in the last 72 hours   Recent Labs Lab 12/31/12 1940 01/03/13 0344  AST 26 22  ALT 14 12  ALKPHOS 69 55  BILITOT 1.6* 0.4  PROT 6.7 4.2*  ALBUMIN 3.2* 1.3*     Lipase     Component Value Date/Time   LIPASE 162* 12/31/2012 1940     Studies/Results: Dg Chest Port 1 View  01/05/2013  *RADIOLOGY REPORT*  Clinical Data: Hypertension, atelectasis.  PORTABLE CHEST - 1 VIEW  Comparison:   the previous day's study  Findings: Nasogastric tube and left IJ central  line are stable in position.  Atheromatous aorta.  Heart size normal.  Small left pleural effusion.  Persistent airspace consolidation in the left lower lung.  Patchy alveolar opacities in the left upper lung. Moderate interstitial opacities throughout the right lung. Regional bones unremarkable.  IMPRESSION: 1. Stable asymmetric infiltrates left greater than right. 2. Support hardware stable in position.   Original Report Authenticated By: D. Andria Rhein, MD     Medications: . antiseptic oral rinse  15 mL Mouth Rinse QID  . chlorhexidine  15 mL Mouth Rinse BID  . furosemide  40 mg Intravenous Q12H  . heparin subcutaneous  5,000 Units Subcutaneous Q8H  . insulin aspart  0-15 Units Subcutaneous Q4H  . pantoprazole (PROTONIX) IV  40 mg Intravenous Q24H  . piperacillin-tazobactam (ZOSYN)  IV  2.25 g Intravenous Q6H  . sodium chloride  1,000 mL Intravenous Once  . sodium chloride  10-40 mL Intracatheter Q12H   . sodium chloride 50 mL/hr at 01/04/13 1100    Assessment/Plan Bowel obstruction and pneumatosis.  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 Dr. Biagio Quint Acute respiratory failure/Extubated with FM/Hypoxia improving Leukocytosis HTN (hypertension)  BP is stable Acute renal failure  Hyperglycemia  Hx of DVT Factor V leiden Dysphonia   Plan:  Urine output a little better  with lasix. Cr still trending up.  I will leave fluids and renal Rx to CCM.  She is tolerating some ice chips with supervision and sitting up.  SLP would like to hold diet here until Mon.  If not cleared for diet in the next few days, will need to start tube feeds.      LOS: 6 days    Aldora Perman C. 01/06/2013

## 2013-01-06 NOTE — Progress Notes (Signed)
PULMONARY  / CRITICAL CARE MEDICINE  Name: [name] MRN: [id number] DOB: 1929-01-29    ADMISSION DATE:  12/31/2012 CONSULTATION DATE:  12/31/2012  REFERRING MD :  EDP PRIMARY SERVICE:  PCCM  CHIEF COMPLAINT:  Abdominal pain/ SBO  BRIEF PATIENT DESCRIPTION: 79 y/o without significant medical problems who developed abdominal pain, nausea, and vomiting 3 days prior to presentation to [facility] ED.  Found to have high grade SBO with pneumatosis and portal venous gas. Exploratory Lap early am 01/01/13, found foreign body imbedded in Small Bowel ?Bezoar. Pt. with hypoxia with LLL airspace disease.  SIGNIFICANT EVENTS / STUDIES:  3/31 - Presented with nausea, vomiting, abdominal pain 3/31 -  Abdominal CT >>> High grade distal SBO and evidence of bowel ischemia 4/01 - Exploratory Lap,  Placed on ARDS Protocol, a fib with RVR >> amiodarone added 4/03 - d/c ARDS protocol 01/04/13 - Weaning pressors.  In sinus rhythm.  Extubated 01/05/13 - improved ur op but still only marginal per RN. Very deconditioned and sleepy but not confused. Off pressors and extubated. DC amio  ECHO - ef 65%, Gr1 diast dysfn and PASP 49  LINES / TUBES: NGT 3/31 >>> Foley 3/31 >>> ETT 01/01/13>>>01/04/13, BIPAP 01/06/13 prn  >> L IJ CVC 01/02/13>>>  CULTURES: 4/2>>> Sputum 4/2>>>Blood 4/2>>> Urine  ANTIBIOTICS: Zosyn 3/31 (abd coverage)>>>  INTERVAL HISTORY:  01/06/13 - worsening creat. Declined dialysis to surgeon per RN.  RN reporting not enough urine op despite lasix. Still volume overloaded. Mild labored breathing  VITAL SIGNS: Temp:  [98.2 F (36.8 C)-98.9 F (37.2 C)] 98.3 F (36.8 C) (04/06 0800) Pulse Rate:  [44-97] 90 (04/06 0947) Resp:  [17-21] 18 (04/06 0947) BP: (117-155)/(37-68) 142/61 mmHg (04/06 0900) SpO2:  [93 %-100 %] 98 % (04/06 0947) FiO2 (%):  [40 %] 40 % (04/06 0947) Weight:  [77.7 kg (171 lb 4.8 oz)] 77.7 kg (171 lb 4.8 oz) (04/06 0500)  HEMODYNAMICS:     VENTILATOR SETTINGS: Vent Mode:  [-]   FiO2 (%):  [40 %] 40 %  INTAKE / OUTPUT: Intake/Output     04/05 0701 - 04/06 0700 04/06 0701 - 04/07 0700   I.V. (mL/kg) 1250.9 (16.1) 100 (1.3)   IV Piggyback 150    Total Intake(mL/kg) 1400.9 (18) 100 (1.3)   Urine (mL/kg/hr) 1110 (0.6) 50 (0.2)   Emesis/NG output     Stool 4 (0)    Total Output 1114 50   Net +286.9 +50         PHYSICAL EXAMINATION: General:  Deconditioined. Neuro:  More alert and awake. RASS 0. CAm-ICU neg for delirium HEENT:  PERRL, NGT, dry mucous membranes Cardiovascular:  NSR off Amiodarone ( s/p amio 01/01/13 to 01/05/13), No RMG Lungs:  Bilaterally diminished L>R . Some crackles. Mildly labored.  Abdomen:  Distended, hypoactive BS, bilious drainage per NG. Musculoskeletal:  Moves all extremities, no edema noted Skin:  Intact  LABS:  Recent Labs Lab 12/31/12 1940 12/31/12 1950  01/01/13 0100  01/01/13 1745  01/01/13 2300 01/02/13 0015 01/02/13 0455 01/02/13 0945 01/03/13 0344 01/03/13 0950  01/04/13 0414 01/05/13 0453 01/05/13 1045 01/05/13 1313 01/05/13 1655 01/06/13 0500  HGB 16.5* 17.3*  --   --   < >  --   --   --   --  12.3  --  10.3*  --   --  11.8* 11.5*  --   --   --   --   WBC 9.8  --   --   --   < >  --   --   --   --  11.7*  --  11.5*  --   --  11.0* 13.6*  --   --   --   --   PLT 221  --   --   --   < >  --   --   --   --  199  --  142*  --   --  107* 113*  --   --   --   --   NA 125* 123*  --  125*  < >  --   --   --   --  129*  --  131*  --   < > 134* 136  --   --   --  135  K 3.8 7.3*  --  4.0  < >  --   --   --   --  3.7  --  2.8*  --   < > 3.6 4.3  --   --   --  4.0  CL 78* 85*  --  82*  < >  --   --   --   --  95*  --  98  --   < > 103 103  --   --   --  103  CO2 28  --   --  31  < >  --   --   --   --  23  --  18*  --   < > 16* 14*  --   --   --  15*  GLUCOSE 175* 181*  --  159*  < >  --   --   --   --  151*  --  115*  --   < > 88 109*  --   --   --  115*  BUN 79* 131*  --  81*  < >  --   --   --   --  79*  --  77*  --    < > 75* 78*  --   --   --  84*  CREATININE 2.49* 2.90*  --  2.65*  < >  --   --   --   --  3.04*  --  3.01*  --   < > 3.11* 3.35*  --   --   --  3.83*  CALCIUM 9.2  --   --  7.9*  < >  --   --   --   --  6.4*  --  6.3*  --   < > 7.0* 8.1*  --   --   --  8.7  MG  --   --   < > 1.9  --   --   --   --   --  1.7  --   --   --   --   --  1.8  --   --   --  1.9  PHOS  --   --   < > 5.0*  --   --   --   --   --  5.4*  --   --   --   --   --  7.0*  --   --   --  7.5*  AST 26  --   --   --   --   --   --   --   --   --   --  22  --   --   --   --   --   --   --   --  ALT 14  --   --   --   --   --   --   --   --   --   --  12  --   --   --   --   --   --   --   --   ALKPHOS 69  --   --   --   --   --   --   --   --   --   --  55  --   --   --   --   --   --   --   --   BILITOT 1.6*  --   --   --   --   --   --   --   --   --   --  0.4  --   --   --   --   --   --   --   --   PROT 6.7  --   --   --   --   --   --   --   --   --   --  4.2*  --   --   --   --   --   --   --   --   ALBUMIN 3.2*  --   --   --   --   --   --   --   --   --   --  1.3*  --   --   --   --   --   --   --   --   APTT  --   --   --  29  --   --   --   --   --   --   --   --   --   --   --   --   --   --   --   --   INR  --   --   --  1.31  --   --   --   --   --   --   --   --   --   --   --   --   --   --   --   --   LATICACIDVEN  --   --   < > 2.0  < > 1.3  --  1.1  --  1.1  --   --   --   --   --   --   --   --   --   --   TROPONINI  --   --   --   --   --   --   < > <0.30  --   --   --   --   --   --   --   --  <0.30 <0.30 <0.30  --   PROCALCITON  --   --   --   --   --   --   --   --   --   --   --   --   --   --   --  15.42  --   --   --  12.41  PROBNP 3133.0*  --   --   --   --   --   --   --   --   --   --   --   --   --   --   --   --   --   --  6137.0*  PHART  --   --   --   --   < >  --   < >  --  7.346*  --  7.429  --  7.334*  --   --   --   --   --   --   --   PCO2ART  --   --   --   --   < >  --   < >  --  37.1  --   28.4*  --  31.2*  --   --   --   --   --   --   --   PO2ART  --   --   --   --   < >  --   < >  --  124.0*  --  116.0*  --  74.8*  --   --   --   --   --   --   --   < > = values in this interval not displayed.  Recent Labs Lab 01/04/13 1949 01/04/13 2327 01/05/13 0331 01/05/13 0744 01/05/13 1211  GLUCAP 91 86 100* 105* 112*    Imaging: Dg Chest Port 1 View  01/05/2013  *RADIOLOGY REPORT*  Clinical Data: Hypertension, atelectasis.  PORTABLE CHEST - 1 VIEW  Comparison:   the previous day's study  Findings: Nasogastric tube and left IJ central line are stable in position.  Atheromatous aorta.  Heart size normal.  Small left pleural effusion.  Persistent airspace consolidation in the left lower lung.  Patchy alveolar opacities in the left upper lung. Moderate interstitial opacities throughout the right lung. Regional bones unremarkable.  IMPRESSION: 1. Stable asymmetric infiltrates left greater than right. 2. Support hardware stable in position.   Original Report Authenticated By: D. Andria Rhein, MD                   ASSESSMENT / PLAN: Principal Problem:   SBO (small bowel obstruction) Active Problems:   PNA (pneumonia)   HTN (hypertension)   Hypoxia   Hyponatremia   Dehydration   Acute renal failure   Hyperglycemia   Acute respiratory failure   Hypovolemic shock   Sepsis(995.91)   Acute blood loss anemia   PULMONARY A:   Suspected atelectasis LLL.   Hypoxemic respiratory failure after surgery. S/p extubation 01/05/13   01/06/13: mild labored breathing that is worse in past 24h  P: - BIpap day time 2h and then prn + QHS Aggressive pulm toilet, OOB to chair,  - nasal cannula -CXR PRN -BDs -ABX per ID section    CARDIOVASCULAR  Recent Labs Lab 12/31/12 1940 01/06/13 0500  PROBNP 3133.0* 6137.0*      Recent Labs Lab 01/01/13 2300 01/05/13 1045 01/05/13 1313 01/05/13 1655  TROPONINI <0.30 <0.30 <0.30 <0.30    A:  SIRS / sepsis - Shock state with afib rvr  with low cvp: hypovolemic shock, doubt septic shock.  Lactate trending down: 1.1( 4/2).    A-Fib w/RVR  - noted 4/1, resolved 4/2 with Amio gtt.  Negative troponin.   01/03/13 - Sinus 01/05/13  - sinus and dc amio 01/06/13: Still volume overloaded. ECHO shiows Gr1 diast dysfhn with PASP 49  P: Saline lock and diurese Check bnp and echo and get serial enzymes Hold Norvasc, ASA, Crestor, Avapro; restrt when able and can take po  RENAL  Recent Labs Lab 12/31/12 1950 01/01/13 0100  01/02/13 0455 01/03/13 0344 01/03/13 1620 01/04/13 0414  01/05/13 0453 01/06/13 0500  NA 123* 125*  < > 129* 131* 133* 134* 136 135  K 7.3* 4.0  < > 3.7 2.8* 3.9 3.6 4.3 4.0  CL 85* 82*  < > 95* 98 102 103 103 103  CO2  --  31  < > 23 18* 17* 16* 14* 15*  GLUCOSE 181* 159*  < > 151* 115* 102* 88 109* 115*  BUN 131* 81*  < > 79* 77* 75* 75* 78* 84*  CREATININE 2.90* 2.65*  < > 3.04* 3.01* 3.05* 3.11* 3.35* 3.83*  CALCIUM  --  7.9*  < > 6.4* 6.3* 6.4* 7.0* 8.1* 8.7  MG  --  1.9  --  1.7  --   --   --  1.8 1.9  PHOS  --  5.0*  --  5.4*  --   --   --  7.0* 7.5*  < > = values in this interval not displayed. Intake/Output     04/05 0701 - 04/06 0700 04/06 0701 - 04/07 0700   I.V. (mL/kg) 1250.9 (16.1) 100 (1.3)   IV Piggyback 150    Total Intake(mL/kg) 1400.9 (18) 100 (1.3)   Urine (mL/kg/hr) 1110 (0.6) 50 (0.2)   Emesis/NG output     Stool 4 (0)    Total Output 1114 50   Net +286.9 +50          A:   Acute renal failure - in setting of intravascular depletion / ARF. ?? baseline renal function. No old labs in system.  Renal U/S with bilateral renal cysts, negative hydronephrosis.  01/05/13  - Improving Ur OP but rising creatinine but rate of rise declining. Volume overloaded + 01/06/13: Still not negative balance and has worsening volume overload  P:  ' Saline lock Increase  lasix to tid on 01/06/13 Check abg to clarify acid base status Renal consult depending on above (per RN patient has refused HD to the  surgeon) Avoid nephrotoxins (d/w pharm 01/06/13): zosyn will be reduced    GASTROINTESTINAL  Recent Labs Lab 12/31/12 1940 01/01/13 0100 01/03/13 0344  AST 26  --  22  ALT 14  --  12  ALKPHOS 69  --  55  BILITOT 1.6*  --  0.4  PROT 6.7  --  4.2*  ALBUMIN 3.2*  --  1.3*  INR  --  1.31  --     A:   Distal SBO.   Suspected ischemic bowel.  S/p ex lap 4/1 with foreign body removal  01/05/13  -Failed swallow eval  P: ppi NPO   HEMATOLOGIC  Recent Labs Lab 01/03/13 0344 01/04/13 0414 01/05/13 0453  HGB 10.3* 11.8* 11.5*  HCT 28.9* 34.8* 34.6*  WBC 11.5* 11.0* 13.6*  PLT 142* 107* 113*    Recent Labs Lab 01/01/13 0100  INR 1.31    A:   H/o DVT, factor V Leiden. Anemia of critical illness THrombocytopenia of critical illness  P:  SQ heparin since 01/01/13 for dvt prph dose PRBC for hgb < 7gm% unless actively bleeding  INFECTIOUS  Recent Labs Lab 01/01/13 1745 01/01/13 2300 01/02/13 0455 01/05/13 0453 01/06/13 0500  LATICACIDVEN 1.3 1.1 1.1  --   --   PROCALCITON  --   --   --  15.42 12.41    Recent Labs Lab 01/01/13 0350 01/02/13 0455 01/03/13 0344 01/04/13 0414 01/05/13 0453  WBC 7.6 11.7* 11.5* 11.0* 13.6*     A:   Suspected aspiration pneumonia 2/2 SBO 01/06/13:  PCT still high, ? Renal failure resulting in poor clearance  P:  Continue Zosyn  Monitor  PCT algorthm to decide abx stop dtate  ENDOCRINE  CBG (last 3)   Recent Labs  01/05/13 0331 01/05/13 0744 01/05/13 1211  GLUCAP 100* 105* 112*     A:   Hyperglycemia - No h/o DM.   P:   CBG's and SSI   NEUROLOGIC A:   01/06/13 - deconditioned. RASS 0 but CAM-ICU neg for delirium   P:   Dc all fentanyl       GLOBAL 01/05/13 - no family at bedside. Might need inptient rehab 01/06/13- no family at bedside. Renal failure outcome will impact her prognosis     The patient is critically ill with multiple organ systems failure and requires high complexity decision  making for assessment and support, frequent evaluation and titration of therapies, application of advanced monitoring technologies and extensive interpretation of multiple databases.   Critical Care Time devoted to patient care services described in this note is  35  Minutes.  Dr. Kalman Shan, M.D., [facility].C.P Pulmonary and Critical Care Medicine Staff Physician [facility] [place] Pager: [phone], If no answer or between  15:00h - 7:00h: call [phone]  01/06/2013 10:02 AM         Dr. Kalman Shan, M.D., F.C.C.P Pulmonary and Critical Care Medicine Staff Physician [facility] and Critical Care Pager: [phone], If no answer or between  15:00h - 7:00h: call [phone]  01/06/2013 9:49 AM

## 2013-01-06 NOTE — Progress Notes (Signed)
ANTIBIOTIC CONSULT NOTE - FOLLOW UP  Pharmacy Consult for Zosyn Indication: Aspiration PNA, ischemic bowel  Allergies  Allergen Reactions  . Diovan (Valsartan)   . Hctz (Hydrochlorothiazide)   . Tekamlo (Aliskiren-Amlodipine)   . Valturna (Aliskiren-Valsartan)     Patient Measurements: Height: 5\' 3"  (160 cm) Weight: 171 lb 4.8 oz (77.7 kg) IBW/kg (Calculated) : 52.4 Adjusted Body Weight:   Vital Signs: Temp: 98.3 F (36.8 C) (04/06 0800) Temp src: Oral (04/06 0800) BP: 142/61 mmHg (04/06 0900) Pulse Rate: 90 (04/06 0947) Intake/Output from previous day: 04/05 0701 - 04/06 0700 In: 1400.9 [I.V.:1250.9; IV Piggyback:150] Out: 1114 [Urine:1110; Stool:4] Intake/Output from this shift: Total I/O In: 100 [I.V.:100] Out: 50 [Urine:50]  Labs:  Recent Labs  01/04/13 0414 01/05/13 0453 01/06/13 0500  WBC 11.0* 13.6*  --   HGB 11.8* 11.5*  --   PLT 107* 113*  --   CREATININE 3.11* 3.35* 3.83*   Estimated Creatinine Clearance: 11.4 ml/min (by C-G formula based on Cr of 3.83). No results found for this basename: VANCOTROUGH, Leodis Binet, VANCORANDOM, GENTTROUGH, GENTPEAK, GENTRANDOM, TOBRATROUGH, TOBRAPEAK, TOBRARND, AMIKACINPEAK, AMIKACINTROU, AMIKACIN,  in the last 72 hours   Microbiology: Recent Results (from the past 720 hour(s))  MRSA PCR SCREENING     Status: None   Collection Time    01/01/13  7:04 AM      Result Value Range Status   MRSA by PCR NEGATIVE  NEGATIVE Final   Comment:            The GeneXpert MRSA Assay (FDA     approved for NASAL specimens     only), is one component of a     comprehensive MRSA colonization     surveillance program. It is not     intended to diagnose MRSA     infection nor to guide or     monitor treatment for     MRSA infections.  CULTURE, RESPIRATORY (NON-EXPECTORATED)     Status: None   Collection Time    01/02/13  9:52 AM      Result Value Range Status   Specimen Description SPU SUCTIONED   Final   Special Requests NONE    Final   Gram Stain     Final   Value: FEW WBC PRESENT, PREDOMINANTLY PMN     RARE SQUAMOUS EPITHELIAL CELLS PRESENT     NO ORGANISMS SEEN   Culture FEW ESCHERICHIA COLI   Final   Report Status 01/04/2013 FINAL   Final   Organism ID, Bacteria ESCHERICHIA COLI   Final  CULTURE, BLOOD (ROUTINE X 2)     Status: None   Collection Time    01/02/13 10:40 AM      Result Value Range Status   Specimen Description BLOOD RIGHT ARM   Final   Special Requests BOTTLES DRAWN AEROBIC AND ANAEROBIC 10CC   Final   Culture  Setup Time 01/02/2013 15:27   Final   Culture     Final   Value:        BLOOD CULTURE RECEIVED NO GROWTH TO DATE CULTURE WILL BE HELD FOR 5 DAYS BEFORE ISSUING A FINAL NEGATIVE REPORT   Report Status PENDING   Incomplete  CULTURE, BLOOD (ROUTINE X 2)     Status: None   Collection Time    01/02/13 10:58 AM      Result Value Range Status   Specimen Description BLOOD RIGHT ARM   Final   Special Requests BOTTLES DRAWN AEROBIC AND ANAEROBIC  3CC   Final   Culture  Setup Time 01/02/2013 15:26   Final   Culture     Final   Value:        BLOOD CULTURE RECEIVED NO GROWTH TO DATE CULTURE WILL BE HELD FOR 5 DAYS BEFORE ISSUING A FINAL NEGATIVE REPORT   Report Status PENDING   Incomplete  URINE CULTURE     Status: None   Collection Time    01/02/13 12:31 PM      Result Value Range Status   Specimen Description URINE, CATHETERIZED   Final   Special Requests NONE   Final   Culture  Setup Time 01/03/2013 01:11   Final   Colony Count NO GROWTH   Final   Culture NO GROWTH   Final   Report Status 01/03/2013 FINAL   Final    Anti-infectives   Start     Dose/Rate Route Frequency Ordered Stop   01/01/13 0600  piperacillin-tazobactam (ZOSYN) IVPB 2.25 g     2.25 g 100 mL/hr over 30 Minutes Intravenous 4 times per day 01/01/13 0221     12/31/12 2100  piperacillin-tazobactam (ZOSYN) IVPB 3.375 g     3.375 g 100 mL/hr over 30 Minutes Intravenous  Once 12/31/12 2048 12/31/12 2226   12/31/12  2100  metroNIDAZOLE (FLAGYL) IVPB 500 mg  Status:  Discontinued     500 mg 100 mL/hr over 60 Minutes Intravenous  Once 12/31/12 2048 12/31/12 2309      Assessment: 83 yoF on day #7 Zosyn for r/o aspiration PNA and ischemic bowel s/p exploratory lap.   ARF:  SCr still rising, CrCl ~68ml/min.    Afebrile  WBC remains elevated  PCT remains elevated  Urine cx negative  Blood cx negative to date.  Sputum cx: E.coli pan-sens except Bactrim.   Plan:   Reduce Zosyn 2.25g IV from q6h to q8h.   F/u renal fxn, cultures, Tm, WBC, clinical course.  Charolotte Eke, PharmD, pager [phone]. 01/06/2013,10:40 AM.

## 2013-01-07 LAB — GLUCOSE, CAPILLARY
Glucose-Capillary: 110 mg/dL — ABNORMAL HIGH (ref 70–99)
Glucose-Capillary: 101 mg/dL — ABNORMAL HIGH (ref 70–99)
Glucose-Capillary: 122 mg/dL — ABNORMAL HIGH (ref 70–99)

## 2013-01-07 LAB — URINALYSIS, ROUTINE W REFLEX MICROSCOPIC
Bilirubin Urine: NEGATIVE
Bilirubin Urine: NEGATIVE
Glucose, UA: NEGATIVE mg/dL
Nitrite: NEGATIVE
Protein, ur: 30 mg/dL — AB
Protein, ur: NEGATIVE mg/dL
Specific Gravity, Urine: 1.014 (ref 1.005–1.030)
Specific Gravity, Urine: 1.012 (ref 1.005–1.030)
Urobilinogen, UA: 0.2 mg/dL (ref 0.0–1.0)
Urobilinogen, UA: 0.2 mg/dL (ref 0.0–1.0)

## 2013-01-07 LAB — RENAL FUNCTION PANEL
Albumin: 1.2 g/dL — ABNORMAL LOW (ref 3.5–5.2)
Calcium: 8.5 mg/dL (ref 8.4–10.5)
GFR calc Af Amer: 10 mL/min — ABNORMAL LOW (ref >90)
Glucose, Bld: 111 mg/dL — ABNORMAL HIGH (ref 70–99)
Phosphorus: 7.9 mg/dL — ABNORMAL HIGH (ref 2.3–4.6)
Potassium: 3.8 mEq/L (ref 3.5–5.1)
Sodium: 140 mEq/L (ref 135–145)

## 2013-01-07 LAB — URINE MICROSCOPIC-ADD ON

## 2013-01-07 LAB — BASIC METABOLIC PANEL
CO2: 14 mEq/L — ABNORMAL LOW (ref 19–32)
Calcium: 8.6 mg/dL (ref 8.4–10.5)
Creatinine, Ser: 4.5 mg/dL — ABNORMAL HIGH (ref 0.50–1.10)

## 2013-01-07 LAB — CBC
HCT: 35.3 % — ABNORMAL LOW (ref 36.0–46.0)
Hemoglobin: 12 g/dL (ref 12.0–15.0)
MCH: 29.5 pg (ref 26.0–34.0)
MCHC: 34 g/dL (ref 30.0–36.0)
MCV: 86.7 fL (ref 78.0–100.0)

## 2013-01-07 LAB — PRO B NATRIURETIC PEPTIDE: Pro B Natriuretic peptide (BNP): 6067 pg/mL — ABNORMAL HIGH (ref 0–450)

## 2013-01-07 LAB — URIC ACID: Uric Acid, Serum: 9.2 mg/dL — ABNORMAL HIGH (ref 2.4–7.0)

## 2013-01-07 LAB — PROCALCITONIN: Procalcitonin: 8.55 ng/mL

## 2013-01-07 MED ORDER — SODIUM BICARBONATE 8.4 % IV SOLN
INTRAVENOUS | Status: DC
  Administered 2013-01-07: 10:00:00 via INTRAVENOUS
  Filled 2013-01-07: qty 150

## 2013-01-07 MED ORDER — ALTEPLASE 2 MG IJ SOLR
2.0000 mg | Freq: Once | INTRAMUSCULAR | Status: AC
  Administered 2013-01-07: 2 mg
  Filled 2013-01-07: qty 2

## 2013-01-07 MED ORDER — FUROSEMIDE 10 MG/ML IJ SOLN
160.0000 mg | Freq: Four times a day (QID) | INTRAMUSCULAR | Status: DC
  Administered 2013-01-07: 160 mg via INTRAVENOUS
  Filled 2013-01-07 (×3): qty 16

## 2013-01-07 MED ORDER — HEPARIN SODIUM (PORCINE) 1000 UNIT/ML DIALYSIS
1000.0000 [IU] | INTRAMUSCULAR | Status: DC | PRN
  Filled 2013-01-07: qty 6

## 2013-01-07 MED ORDER — STERILE WATER FOR INJECTION IV SOLN
INTRAVENOUS | Status: DC
  Filled 2013-01-07 (×9): qty 150

## 2013-01-07 MED ORDER — ALBUMIN HUMAN 25 % IV SOLN
25.0000 g | Freq: Four times a day (QID) | INTRAVENOUS | Status: DC
  Administered 2013-01-07 – 2013-01-09 (×6): 25 g via INTRAVENOUS
  Filled 2013-01-07 (×15): qty 100

## 2013-01-07 MED ORDER — SODIUM CHLORIDE 0.9 % FOR CRRT
INTRAVENOUS_CENTRAL | Status: DC | PRN
  Filled 2013-01-07: qty 1000

## 2013-01-07 MED ORDER — ALTEPLASE 2 MG IJ SOLR
2.0000 mg | Freq: Once | INTRAMUSCULAR | Status: AC | PRN
  Filled 2013-01-07: qty 2

## 2013-01-07 MED ORDER — PRISMASOL BGK 4/2.5 32-4-2.5 MEQ/L IV SOLN
INTRAVENOUS | Status: DC
  Filled 2013-01-07 (×9): qty 5000

## 2013-01-07 MED ORDER — FUROSEMIDE 10 MG/ML IJ SOLN
30.0000 mg/h | INTRAVENOUS | Status: DC
  Administered 2013-01-08 (×2): 30 mg/h via INTRAVENOUS
  Filled 2013-01-07 (×4): qty 25

## 2013-01-07 MED ORDER — PRISMASOL BGK 4/2.5 32-4-2.5 MEQ/L IV SOLN
INTRAVENOUS | Status: DC
  Filled 2013-01-07 (×6): qty 5000

## 2013-01-07 NOTE — Progress Notes (Signed)
Pt transferred to [facility] from [facility]. Pt looks and feels comfortable. In no apparent resp distress. Sats 95-97% on 2L o2 via [place].  Vitals stable except mild-mod elevated SBP.  Discussed with Dr. Lacy Duverney about plan for CRRT, as pt was transferred here for that. She doesn't seem to have any emergent need for HD overnight.  Plan- - Restart Lasix- IV drip- 30mg /hr ( she was on 160mg  iv q6 hr). - Renal team will evaluate her in am and decide about HD. She might tolerate HD and might not need CRRT. - Renal panel in am.

## 2013-01-07 NOTE — Clinical Social Work Psychosocial (Signed)
Clinical Social Work Department BRIEF PSYCHOSOCIAL ASSESSMENT 01/07/2013  Patient:  [name], [name]     Account Number:  [id number]     Admit date:  12/31/2012  Clinical Social Worker:  Jodelle Red  Date/Time:  01/07/2013 11:16 AM  Referred by:  CSW  Date Referred:  01/07/2013 Referred for  Other - See comment   Other Referral:   POSSIBLE NEED FOR SNF, CHECK ON FOR SUPPORT   Interview type:  Patient Other interview type:   CHART REVIEW, FAMILY-DAUGHTER, PAULA FROM [place]    PSYCHOSOCIAL DATA Living Status:  ALONE Admitted from facility:   Level of care:   Primary support name:  Cyndia Diver Primary support relationship to patient:  FAMILY Degree of support available:   good, but limited support locally.  Both son and daughter live out of town.    CURRENT CONCERNS Current Concerns  Post-Acute Placement   Other Concerns:   possible need for SNF, not seen by PT to date    SOCIAL WORK ASSESSMENT / PLAN CSW met with Pt and daughter, [name] to check in and assess needs due to possible need for SNF. Pt is open to the idea, but states she was very active prior to hospitalization. No family locally, daughter lives in [place], [place] in [place]. Daughter and son plan to be available in the short term and are open to SNF as an option.  Pt agrees to SNF if needed and prefers [facility].   Assessment/plan status:  Psychosocial Support/Ongoing Assessment of Needs Other assessment/ plan:   follow for possible SNF   Information/referral to community resources:   gave SNF list    PATIENT'S/FAMILY'S RESPONSE TO PLAN OF CARE: Pt and daughter report to be coping well and agree to SNF search if needed after PT eval. CSW will follow for support and SNF. Pt prefers [facility].    Doreen Salvage, LCSW ICU/Stepdown Clinical Social Worker [facility] Cell [phone] Hours 8am-1200pm M-F

## 2013-01-07 NOTE — Progress Notes (Signed)
PT does not appear to be in respiratory distress at this time- BiPAP not needed at this time. 

## 2013-01-07 NOTE — Progress Notes (Signed)
Acute on chronic renal failure; fluid overload; she will be transferred for HD or CVVHD to [facility].   In the interim initiate Lasix 160 mg q6h, to be administered at the same time with albumin. Hope for diureses;   Plan discussed with Dr. Arlean Hopping.

## 2013-01-07 NOTE — Progress Notes (Signed)
Patient ID: [name], female   DOB: Jun 21, 1929, 79 y.o.   MRN: [id number] 6 Days Post-Op  Subjective: Reports she is doing ok, denies significant pain, n/v, had one BM yesterday, NGT is out, denies SOA  Objective: Vital signs in last 24 hours: Temp:  [97.4 F (36.3 C)-99.1 F (37.3 C)] 99.1 F (37.3 C) (04/07 0400) Pulse Rate:  [63-95] 63 (04/07 0600) Resp:  [16-20] 17 (04/07 0600) BP: (127-162)/(49-78) 149/64 mmHg (04/07 0600) SpO2:  [95 %-100 %] 96 % (04/07 0600) FiO2 (%):  [40 %] 40 % (04/06 1200) Weight:  [167 lb 15.9 oz (76.2 kg)] 167 lb 15.9 oz (76.2 kg) (04/07 0400) Last BM Date: 01/06/13  Intake/Output from previous day: 04/06 0701 - 04/07 0700 In: 700 [I.V.:550; IV Piggyback:150] Out: 871 [Urine:870; Stool:1] Intake/Output this shift:   General appearance: alert, mentation seems pretty good today. Resp: rales bilaterally and decreased BS at based. Abdomen:  Min distension, nontender, honey comb dressing in place Inc: clean, dry and intact  Lab Results:   Recent Labs  01/05/13 0453  WBC 13.6*  HGB 11.5*  HCT 34.6*  PLT 113*    BMET  Recent Labs  01/05/13 0453 01/06/13 0500  NA 136 135  K 4.3 4.0  CL 103 103  CO2 14* 15*  GLUCOSE 109* 115*  BUN 78* 84*  CREATININE 3.35* 3.83*  CALCIUM 8.1* 8.7   PT/INR No results found for this basename: LABPROT, INR,  in the last 72 hours   Recent Labs Lab 12/31/12 1940 01/03/13 0344  AST 26 22  ALT 14 12  ALKPHOS 69 55  BILITOT 1.6* 0.4  PROT 6.7 4.2*  ALBUMIN 3.2* 1.3*     Lipase     Component Value Date/Time   LIPASE 162* 12/31/2012 1940     Studies/Results: No results found.  Medications: . antiseptic oral rinse  15 mL Mouth Rinse QID  . chlorhexidine  15 mL Mouth Rinse BID  . ciprofloxacin  2 drop Both Eyes Q4H while awake  . furosemide  40 mg Intravenous Q8H  . heparin subcutaneous  5,000 Units Subcutaneous Q8H  . insulin aspart  0-15 Units Subcutaneous Q4H  . pantoprazole  (PROTONIX) IV  40 mg Intravenous Q24H  . piperacillin-tazobactam (ZOSYN)  IV  2.25 g Intravenous Q8H  . sodium chloride  1,000 mL Intravenous Once  . sodium chloride  10-40 mL Intracatheter Q12H      Assessment/Plan Bowel obstruction and pneumatosis.  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 Dr. Biagio Quint Acute respiratory failure/Extubated with FM/Hypoxia improving Leukocytosis HTN (hypertension)  BP is stable Acute renal failure  Hyperglycemia  Hx of DVT Factor V leiden Dysphonia   Plan:  Urine output unchanged.  Pt central line was occluded and unable to get blood from peripheri so no labs yet this am, leave fluids and renal Rx to CCM.  She is tolerating some ice chips with supervision and sitting up.  SLP would like to hold diet here until today, repeat swallow today and if passes start diet per speech recs.    LOS: 7 days    [name] 01/07/2013

## 2013-01-07 NOTE — Progress Notes (Signed)
PT demonstrates verbal and hands on understanding of Flutter valve usage.

## 2013-01-07 NOTE — Progress Notes (Signed)
PULMONARY  / CRITICAL CARE MEDICINE  Name: [name] MRN: [id number] DOB: [birth year]    ADMISSION DATE:  12/31/2012 CONSULTATION DATE:  12/31/2012  REFERRING MD :  EDP PRIMARY SERVICE:  PCCM  CHIEF COMPLAINT:  Abdominal pain/ SBO  BRIEF PATIENT DESCRIPTION: 79 y/o without significant medical problems who developed abdominal pain, nausea, and vomiting 3 days prior to presentation to [facility] ED.  Found to have high grade SBO with pneumatosis and portal venous gas. Exploratory Lap early am 01/01/13, found foreign body imbedded in Small Bowel ?Bezoar. Pt. with hypoxia with LLL airspace disease.  SIGNIFICANT EVENTS / STUDIES:  3/31 - Presented with nausea, vomiting, abdominal pain 3/31 -  Abdominal CT >>> High grade distal SBO and evidence of bowel ischemia 4/01 - Exploratory Lap,  Placed on ARDS Protocol, a fib with RVR >> amiodarone added 4/03 - d/c ARDS protocol 01/04/13 - Weaning pressors.  In sinus rhythm.  Extubated 01/05/13 - improved ur op but still only marginal per RN. Very deconditioned and sleepy but not confused. Off pressors and extubated. DC amio  ECHO - ef 65%, Gr1 diast dysfn and PASP 49  LINES / TUBES: NGT 3/31 >>> Foley 3/31 >>> ETT 01/01/13>>>01/04/13, BIPAP 01/06/13 prn  >> L IJ CVC 01/02/13>>>  CULTURES: 4/2>>> Sputum - e coli pan S 4/2>>>Blood ng 4/2>>> Urine ng  ANTIBIOTICS: Zosyn 3/31 (abd coverage)>>>  INTERVAL HISTORY: Afebrile Denies pain Poor UO Able to lie supine  VITAL SIGNS: Temp:  [97.4 F (36.3 C)-99.1 F (37.3 C)] 97.5 F (36.4 C) (04/07 0800) Pulse Rate:  [63-93] 63 (04/07 0600) Resp:  [16-19] 17 (04/07 0600) BP: (127-162)/(49-78) 149/64 mmHg (04/07 0600) SpO2:  [95 %-100 %] 96 % (04/07 0600) FiO2 (%):  [40 %] 40 % (04/06 1200) Weight:  [76.2 kg (167 lb 15.9 oz)] 76.2 kg (167 lb 15.9 oz) (04/07 0400)  HEMODYNAMICS:     VENTILATOR SETTINGS: Vent Mode:  [-] BIPAP;PSV FiO2 (%):  [40 %] 40 %  INTAKE / OUTPUT: Intake/Output     04/06 0701 -  04/07 0700 04/07 0701 - 04/08 0700   I.V. (mL/kg) 550 (7.2)    IV Piggyback 150    Total Intake(mL/kg) 700 (9.2)    Urine (mL/kg/hr) 870 (0.5)    Stool 1 (0)    Total Output 871     Net -171          Stool Occurrence 2 x     PHYSICAL EXAMINATION: General:  Deconditioined. Neuro:  More alert and awake.. CAm-ICU neg for delirium HEENT:  PERRL, NGT, dry mucous membranes Cardiovascular:  NSR off Amiodarone ( s/p amio 01/01/13 to 01/05/13), No RMG Lungs:  Bilaterally diminished L>R . Some crackles. Mildly labored.  Abdomen:  Distended, hypoactive BS, bilious drainage per NG. Musculoskeletal:  Moves all extremities, no edema noted Skin:  Intact  LABS:  Recent Labs Lab 12/31/12 1940 12/31/12 1950  01/01/13 0100  01/01/13 1745  01/01/13 2300  01/02/13 0455 01/02/13 0945 01/03/13 0344 01/03/13 0950  01/04/13 0414 01/05/13 0453 01/05/13 1045 01/05/13 1313 01/05/13 1655 01/06/13 0500 01/06/13 1100 01/07/13 0830  HGB 16.5* 17.3*  --   --   < >  --   --   --   --  12.3  --  10.3*  --   --  11.8* 11.5*  --   --   --   --   --  12.0  WBC 9.8  --   --   --   < >  --   --   --   --  11.7*  --  11.5*  --   --  11.0* 13.6*  --   --   --   --   --  12.2*  PLT 221  --   --   --   < >  --   --   --   --  199  --  142*  --   --  107* 113*  --   --   --   --   --  166  NA 125* 123*  --  125*  < >  --   --   --   --  129*  --  131*  --   < > 134* 136  --   --   --  135  --   --   K 3.8 7.3*  --  4.0  < >  --   --   --   --  3.7  --  2.8*  --   < > 3.6 4.3  --   --   --  4.0  --   --   CL 78* 85*  --  82*  < >  --   --   --   --  95*  --  98  --   < > 103 103  --   --   --  103  --   --   CO2 28  --   --  31  < >  --   --   --   --  23  --  18*  --   < > 16* 14*  --   --   --  15*  --   --   GLUCOSE 175* 181*  --  159*  < >  --   --   --   --  151*  --  115*  --   < > 88 109*  --   --   --  115*  --   --   BUN 79* 131*  --  81*  < >  --   --   --   --  79*  --  77*  --   < > 75* 78*  --   --   --   84*  --   --   CREATININE 2.49* 2.90*  --  2.65*  < >  --   --   --   --  3.04*  --  3.01*  --   < > 3.11* 3.35*  --   --   --  3.83*  --   --   CALCIUM 9.2  --   --  7.9*  < >  --   --   --   --  6.4*  --  6.3*  --   < > 7.0* 8.1*  --   --   --  8.7  --   --   MG  --   --   < > 1.9  --   --   --   --   --  1.7  --   --   --   --   --  1.8  --   --   --  1.9  --   --   PHOS  --   --   < > 5.0*  --   --   --   --   --  5.4*  --   --   --   --   --  7.0*  --   --   --  7.5*  --   --   AST 26  --   --   --   --   --   --   --   --   --   --  22  --   --   --   --   --   --   --   --   --   --   ALT 14  --   --   --   --   --   --   --   --   --   --  12  --   --   --   --   --   --   --   --   --   --   ALKPHOS 69  --   --   --   --   --   --   --   --   --   --  55  --   --   --   --   --   --   --   --   --   --   BILITOT 1.6*  --   --   --   --   --   --   --   --   --   --  0.4  --   --   --   --   --   --   --   --   --   --   PROT 6.7  --   --   --   --   --   --   --   --   --   --  4.2*  --   --   --   --   --   --   --   --   --   --   ALBUMIN 3.2*  --   --   --   --   --   --   --   --   --   --  1.3*  --   --   --   --   --   --   --   --   --   --   APTT  --   --   --  29  --   --   --   --   --   --   --   --   --   --   --   --   --   --   --   --   --   --   INR  --   --   --  1.31  --   --   --   --   --   --   --   --   --   --   --   --   --   --   --   --   --   --   LATICACIDVEN  --   --   < > 2.0  < > 1.3  --  1.1  --  1.1  --   --   --   --   --   --   --   --   --   --   --   --   TROPONINI  --   --   --   --   --   --   < > <  0.30  --   --   --   --   --   --   --   --  <0.30 <0.30 <0.30  --   --   --   PROCALCITON  --   --   --   --   --   --   --   --   --   --   --   --   --   --   --  15.42  --   --   --  12.41  --   --   PROBNP 3133.0*  --   --   --   --   --   --   --   --   --   --   --   --   --   --   --   --   --   --  6137.0*  --   --   PHART  --   --   --   --   < >   --   < >  --   < >  --  7.429  --  7.334*  --   --   --   --   --   --   --  7.232*  --   PCO2ART  --   --   --   --   < >  --   < >  --   < >  --  28.4*  --  31.2*  --   --   --   --   --   --   --  31.7*  --   PO2ART  --   --   --   --   < >  --   < >  --   < >  --  116.0*  --  74.8*  --   --   --   --   --   --   --  85.8  --   < > = values in this interval not displayed.  Recent Labs Lab 01/06/13 1601 01/06/13 1930 01/06/13 2342 01/07/13 0341 01/07/13 0731  GLUCAP 113* 93 98 110* 106*    Imaging: No results found.                ASSESSMENT / PLAN: Principal Problem:   SBO (small bowel obstruction) Active Problems:   PNA (pneumonia)   HTN (hypertension)   Hypoxia   Hyponatremia   Dehydration   Acute renal failure   Hyperglycemia   Acute respiratory failure   Hypovolemic shock   Sepsis(995.91)   Acute blood loss anemia   PULMONARY A:   Suspected atelectasis LLL.   Hypoxemic respiratory failure after surgery. S/p extubation 01/05/13   P: - BIpap day time 2h and then prn + QHS Aggressive pulm toilet, OOB to chair,  - nasal cannula -BDs     CARDIOVASCULAR  Recent Labs Lab 12/31/12 1940 01/06/13 0500  PROBNP 3133.0* 6137.0*      Recent Labs Lab 01/01/13 2300 01/05/13 1045 01/05/13 1313 01/05/13 1655  TROPONINI <0.30 <0.30 <0.30 <0.30    A:  SIRS / sepsis - Shock state with afib rvr with low cvp: hypovolemic shock, doubt septic shock.  Lactate trending down: 1.1( 4/2).    A-Fib w/RVR  - noted 4/1, resolved 4/2 with Amio gtt.  Negative troponin.  01/05/13  - sinus and dc amio ECHO shiows Gr1 diast  dysfhn with PASP 49  P: Saline lock and diurese Hold Norvasc, ASA, Crestor, Avapro; restrt when able and can take po  RENAL  Recent Labs Lab 12/31/12 1950 01/01/13 0100  01/02/13 0455 01/03/13 0344 01/03/13 1620 01/04/13 0414 01/05/13 0453 01/06/13 0500  NA 123* 125*  < > 129* 131* 133* 134* 136 135  K 7.3* 4.0  < > 3.7 2.8* 3.9 3.6 4.3  4.0  CL 85* 82*  < > 95* 98 102 103 103 103  CO2  --  31  < > 23 18* 17* 16* 14* 15*  GLUCOSE 181* 159*  < > 151* 115* 102* 88 109* 115*  BUN 131* 81*  < > 79* 77* 75* 75* 78* 84*  CREATININE 2.90* 2.65*  < > 3.04* 3.01* 3.05* 3.11* 3.35* 3.83*  CALCIUM  --  7.9*  < > 6.4* 6.3* 6.4* 7.0* 8.1* 8.7  MG  --  1.9  --  1.7  --   --   --  1.8 1.9  PHOS  --  5.0*  --  5.4*  --   --   --  7.0* 7.5*  < > = values in this interval not displayed. Intake/Output     04/06 0701 - 04/07 0700 04/07 0701 - 04/08 0700   I.V. (mL/kg) 550 (7.2)    IV Piggyback 150    Total Intake(mL/kg) 700 (9.2)    Urine (mL/kg/hr) 870 (0.5)    Stool 1 (0)    Total Output 871     Net -171          Stool Occurrence 2 x      A:   Acute renal failure - in setting of intravascular depletion / ARF. ?? baseline renal function. No old labs in system.  Renal U/S with bilateral renal cysts, negative hydronephrosis.   P:  ' Saline lock ct  lasix  80 q 12 Add bicarb drip low dose Renal consult depending on above (per RN patient has refused HD to the surgeon) Avoid nephrotoxins (d/w pharm 01/06/13): zosyn will be reduced    GASTROINTESTINAL  Recent Labs Lab 12/31/12 1940 01/01/13 0100 01/03/13 0344  AST 26  --  22  ALT 14  --  12  ALKPHOS 69  --  55  BILITOT 1.6*  --  0.4  PROT 6.7  --  4.2*  ALBUMIN 3.2*  --  1.3*  INR  --  1.31  --     A:   Distal SBO.   Suspected ischemic bowel.  S/p ex lap 4/1 with foreign body removal  01/05/13  -Failed swallow eval  P: ppi Advance PO if ok with surgery - ?rpt swallow eval   HEMATOLOGIC  Recent Labs Lab 01/04/13 0414 01/05/13 0453 01/07/13 0830  HGB 11.8* 11.5* 12.0  HCT 34.8* 34.6* 35.3*  WBC 11.0* 13.6* 12.2*  PLT 107* 113* 166    Recent Labs Lab 01/01/13 0100  INR 1.31    A:   H/o DVT, factor V Leiden. Anemia of critical illness THrombocytopenia of critical illness  P:  SQ heparin since 01/01/13 for dvt prph dose PRBC for hgb < 7gm% unless  actively bleeding  INFECTIOUS  Recent Labs Lab 01/01/13 1745 01/01/13 2300 01/02/13 0455 01/05/13 0453 01/06/13 0500  LATICACIDVEN 1.3 1.1 1.1  --   --   PROCALCITON  --   --   --  15.42 12.41    Recent Labs Lab 01/02/13 0455 01/03/13 0344 01/04/13 0414 01/05/13 0453  01/07/13 0830  WBC 11.7* 11.5* 11.0* 13.6* 12.2*     A:   Suspected aspiration pneumonia 2/2 SBO  PCT dropping- reasuring, ? Renal failure resulting in poor clearance  P:  Continue Zosyn    ENDOCRINE  CBG (last 3)   Recent Labs  01/06/13 2342 01/07/13 0341 01/07/13 0731  GLUCAP 98 110* 106*     A:   Hyperglycemia - No h/o DM.   P:   CBG's and SSI   NEUROLOGIC A:   deconditioned. CAM-ICU neg for delirium   P:  PT consult   GLOBAL  Might need inptient rehab 01/07/13- no family at bedside. Renal failure outcome will impact her prognosis   Care during the described time interval was provided by me and/or other providers on the critical care team.  I have reviewed this patient's available data, including medical history, events of note, physical examination and test results as part of my evaluation  CC time x  31 m  Shakeera Rightmyer V.   01/07/2013 8:57 AM

## 2013-01-07 NOTE — Progress Notes (Signed)
BiPAP not needed at this time- PT remains on 2 lpm [place]. Current vitals on 2 lpm Sp02 96%, HR 85, RR 17. PT does not appear to be in any respiratory distress at this time.

## 2013-01-07 NOTE — Progress Notes (Addendum)
Speech Language Pathology Dysphagia Treatment Patient Details Name: [name] MRN: [id number] DOB: 29-Aug-1929 Today's Date: 01/07/2013 Time: 1101-1140 SLP Time Calculation (min): 39 min  Assessment / Plan / Recommendation Clinical Impression  Pt seen for skilled dysphagiat treatment to aid in determining readiness for po advancement.  Daughter present during session today and acknowledges pt had some mild speech and voice difficulties at times prior to admission - ? source?  Pt's voice was clear although high pitched - which pt states is baseline.  Palatal elevation was decreased but bilateral and there are no focal CN deficits.    Provided pt with water, nectar thickened juice and jello - mild delay in swallow initiation noted - ? delayed oral transiting.  Weak cough noted x1 of approx 6 boluses during po, but pt with mild baseline cough per her statement.  Mildly increased work of breathing with accessory muscle use (? d/t abd discomfort) during intake will increase pt's aspiration risk.  Pt did not appear to overtly aspirate and with absence of definitive neuro diagnosis, suspect deficits to be transient- coorelated to mentation and respiratory status.   Rec consider initiating liquids with full supervision and strict precautions - especially given weakness/deconditioning and ? premorbid mild deficits.    aughter and pt educated and agreeable.  SLP to follow for dysphagia tx, family education.        Diet Recommendation  Initiate / Change Diet: Thin liquid    SLP Plan Continue with current plan of care   Pertinent Vitals/Pain Afebrile, decreased   Swallowing Goals  SLP Swallowing Goals Swallow Study Goal #2 - Progress: Progressing toward goal Swallow Study Goal #3 - Progress: Met  General Respiratory Status: Supplemental O2 delivered via (comment) Behavior/Cognition: Alert;Cooperative;Pleasant mood Patient Positioning: Upright in bed  Oral Cavity - Oral Hygiene Does patient  have any of the following "at risk" factors?: Saliva - thick, dry mouth;Oxygen therapy - cannula, mask, simple oxygen devices;Nutritional status - inadequate Patient is HIGH RISK - Oral Care Protocol followed (see row info): Yes   Dysphagia Treatment Treatment focused on: Skilled observation of diet tolerance;Patient/family/caregiver education Family/Caregiver Educated: [name] dtr Treatment Methods/Modalities: Skilled observation;Differential diagnosis Patient observed directly with PO's: Yes Type of PO's observed: Thin liquids;Nectar-thick liquids (jello) Feeding: Needs assist Liquids provided via: Cup;Straw Oral Phase Signs & Symptoms: Prolonged bolus formation;Prolonged oral phase Pharyngeal Phase Signs & Symptoms: Suspected delayed swallow initiation Type of cueing: Verbal;Tactile Amount of cueing: Moderate   GO    Heather Burnet, MS [facility] SLP [phone]

## 2013-01-07 NOTE — Progress Notes (Signed)
Patient seen and examined.  Repeat swallow evaluation results and recommendations noted.  Will try thin liquid diet.

## 2013-01-07 NOTE — Progress Notes (Signed)
[id number]/Rhonda Davis, RN, BSN, CCM:  CHART REVIEWED AND UPDATED.  Next chart review due on [id number]. NO DISCHARGE NEEDS PRESENT AT THIS TIME. CASE MANAGEMENT [phone] 

## 2013-01-07 NOTE — Consult Note (Addendum)
[name] 01/07/2013 Flannery Cavallero D Requesting Physician:  Dr Marin Shutter  Reason for Consult:  Acute renal failure   HPI: The patient is a 79 y.o. year-old with hx of DVT, Factor V Leiden and osteoporosis.  Patient was admitted 3/31 with N/V and abdominal pain.  Found to have high grade bowel obstruction with gas in the SB and portal vein areas.  Also had LLL consolidation on CT.  Went to OR on 4/1 for ex lap; no bowel perforation was found. There was transition point in distal jejunum with dilated SB proximally. An enterotomy was done to remove a foreign body which turned to be vegetable matter / bezoar.   Patient required pressors for about 48 hours postop. She was extubated 4/5 and has been stable off the vent. CXR"s show progressive L > R air space disease, weight is up 12 kg from admit. Getting IVF's , Lasix IV was tried x 4 doses over last 48 hours and was d/c'd today. Creat has risen from 2.4 on admission to 4.5 today. There are no old creatinines in EPIC system.    Patient is lethargic but arouseable and conversant. No complaints    ROS  no cp or sob   Past Medical History:  Past Medical History  Diagnosis Date  . Osteoporosis   . DVT (deep venous thrombosis)   . Factor V Leiden   . Hypertension   . Dysphonia     Past Surgical History:  Past Surgical History  Procedure Laterality Date  . Tonsillectomy    . Appendectomy    . Abdominal hysterectomy    . Laparotomy N/A 01/01/2013    Procedure: EXPLORATORY LAPAROTOMY removal of smal bowel foreign body;  Surgeon: Lodema Pilot, DO;  Location: [facility] ORS;  Service: General;  Laterality: N/A;    Family History: History reviewed. No pertinent family history. Social History:  reports that she has quit smoking. She does not have any smokeless tobacco history on file. She reports that  drinks alcohol. She reports that she does not use illicit drugs.  Allergies:  Allergies  Allergen Reactions  . Diovan (Valsartan)   . Hctz  (Hydrochlorothiazide)   . Tekamlo (Aliskiren-Amlodipine)   . Valturna (Aliskiren-Valsartan)     Home medications: Prior to Admission medications   Medication Sig Start Date End Date Taking? Authorizing Provider  amLODipine (NORVASC) 10 MG tablet Take 10 mg by mouth daily.   Yes Historical Provider, MD  aspirin EC 81 MG tablet Take 81 mg by mouth daily.   Yes Historical Provider, MD  fexofenadine (ALLEGRA) 180 MG tablet Take 180 mg by mouth daily.   Yes Historical Provider, MD  irbesartan (AVAPRO) 300 MG tablet Take 300 mg by mouth at bedtime.   Yes Historical Provider, MD  Multiple Vitamin (MULTIVITAMIN WITH MINERALS) TABS Take 1 tablet by mouth daily.   Yes Historical Provider, MD  rosuvastatin (CRESTOR) 5 MG tablet Take 5 mg by mouth daily.   Yes Historical Provider, MD    Labs: Basic Metabolic Panel:  Recent Labs Lab 12/31/12 1950 01/01/13 0100  01/02/13 0455 01/03/13 0344 01/03/13 1620 01/04/13 0414 01/05/13 0453 01/06/13 0500 01/07/13 0830  NA 123* 125*  < > 129* 131* 133* 134* 136 135 141  140  K 7.3* 4.0  < > 3.7 2.8* 3.9 3.6 4.3 4.0 3.7  3.8  CL 85* 82*  < > 95* 98 102 103 103 103 107  106  CO2  --  31  < > 23 18* 17*  16* 14* 15* 14*  14*  GLUCOSE 181* 159*  < > 151* 115* 102* 88 109* 115* 111*  111*  BUN 131* 81*  < > 79* 77* 75* 75* 78* 84* 95*  97*  CREATININE 2.90* 2.65*  < > 3.04* 3.01* 3.05* 3.11* 3.35* 3.83* 4.50*  4.52*  CALCIUM  --  7.9*  < > 6.4* 6.3* 6.4* 7.0* 8.1* 8.7 8.6  8.5  PHOS  --  5.0*  --  5.4*  --   --   --  7.0* 7.5* 7.9*  < > = values in this interval not displayed. Liver Function Tests:  Recent Labs Lab 12/31/12 1940 01/03/13 0344 01/07/13 0830  AST 26 22  --   ALT 14 12  --   ALKPHOS 69 55  --   BILITOT 1.6* 0.4  --   PROT 6.7 4.2*  --   ALBUMIN 3.2* 1.3* 1.2*    Recent Labs Lab 12/31/12 1940  LIPASE 162*   No results found for this basename: AMMONIA,  in the last 168 hours CBC:  Recent Labs Lab 12/31/12 1940   01/03/13 0344 01/04/13 0414 01/05/13 0453 01/07/13 0830  WBC 9.8  < > 11.5* 11.0* 13.6* 12.2*  NEUTROABS 7.5  --   --   --   --   --   HGB 16.5*  < > 10.3* 11.8* 11.5* 12.0  HCT 48.0*  < > 28.9* 34.8* 34.6* 35.3*  MCV 87.9  < > 85.0 87.2 87.6 86.7  PLT 221  < > 142* 107* 113* 166  < > = values in this interval not displayed. PT/INR: @LABRCNTIP (inr:5) Cardiac Enzymes: ) Recent Labs Lab 01/01/13 2300 01/05/13 1045 01/05/13 1313 01/05/13 1655  TROPONINI <0.30 <0.30 <0.30 <0.30   CBG:  Recent Labs Lab 01/06/13 1930 01/06/13 2342 01/07/13 0341 01/07/13 0731 01/07/13 1119  GLUCAP 93 98 110* 106* 101*     Physical Exam:  Blood pressure 154/61, pulse 84, temperature 98 F (36.7 C), temperature source Axillary, resp. rate 16, height 5\' 3"  (1.6 m), weight 76.2 kg (167 lb 15.9 oz), SpO2 99.00%.  Gen: lethargic elderly WF, resp effort no great, not in acute distress Skin: no rash, cyanosis HEENT:  EOMI, sclera anicteric, throat slightly dry from mouth breathing Neck: no JVD, no LAN Chest: R basilar rales, L base decreased BS CV: regular, no rub or gallop, pedal pulses intact Abdomen: soft, moderately distended Ext: diffuse 2-3+ pitting edema of arms and legs, no joint effusion or deformity, no gangrene or ulceration Neuro: lethargic, arouseable, no asterixis or myoclonus  3/31 CT Abd, noncontrast > air in SB, bilat basilar consolidation L > R 4/5 last CXR > Left > R bilat basilar infiltrates 4/2  Renal US >> normal kidneys, no hydro 4/5 ECHO > normal LV, normal study Creatinine 2.41 (3/31) >>> 4.50 (4/7) I/O +10.9L since admit Weight 64 >> 76.2 kg  Impression/Plan  1. Renal failure, likely acute but don't have a baseline.  Creat 2.4 on admission, 4.5 now. No hx of renal failure per family.  Likely ATN due to shock earlier in hospital stay.  No nephrotoxins noted.  Send UA, urine lytes.  Marked volume overload and at risk of intubation. Also + uremic and significant  metabolic acidosis.  Patient and daughter want aggressive Rx at this time to include dialysis. Recommend move to [facility], place temp HD cath, and start on CRRT.  I have written CRRT orders.  Will give high dose IV lasix now.  Discussed with family and CCM MD. 2. SBO s/p exlap and removal of bezoar in jejenum 3. Volume overload / pulm edema and bibasilar PNA- on Zosyn IV, resp status tenuous >> D/C IVF's, lasix, CRRT   Vinson Moselle  MD [facility] [phone] pgr     [phone] cell 01/07/2013, 5:33 PM

## 2013-01-07 NOTE — Progress Notes (Signed)
RN unable to obtain blood for lab thru Pt's Central line, IV team RN assessed the line and found occluded. TPA  applied and it  is dwelling in the line. TPA applied at 0525, it will need to dwell for a minimum of two hours per IV team. Lab unable to obtain blood from vein due to profound edema.

## 2013-01-08 ENCOUNTER — Inpatient Hospital Stay (HOSPITAL_COMMUNITY): Payer: Medicare Other

## 2013-01-08 DIAGNOSIS — R0902 Hypoxemia: Secondary | ICD-10-CM

## 2013-01-08 LAB — CBC
HCT: 30.4 % — ABNORMAL LOW (ref 36.0–46.0)
Hemoglobin: 10.6 g/dL — ABNORMAL LOW (ref 12.0–15.0)
RBC: 3.63 MIL/uL — ABNORMAL LOW (ref 3.87–5.11)
WBC: 9 10*3/uL (ref 4.0–10.5)

## 2013-01-08 LAB — CULTURE, BLOOD (ROUTINE X 2): Culture: NO GROWTH

## 2013-01-08 LAB — RENAL FUNCTION PANEL
BUN: 102 mg/dL — ABNORMAL HIGH (ref 6–23)
CO2: 15 mEq/L — ABNORMAL LOW (ref 19–32)
Chloride: 108 mEq/L (ref 96–112)
GFR calc Af Amer: 9 mL/min — ABNORMAL LOW (ref >90)
Glucose, Bld: 135 mg/dL — ABNORMAL HIGH (ref 70–99)
Phosphorus: 7.6 mg/dL — ABNORMAL HIGH (ref 2.3–4.6)
Potassium: 3.5 mEq/L (ref 3.5–5.1)
Sodium: 142 mEq/L (ref 135–145)

## 2013-01-08 LAB — GLUCOSE, CAPILLARY: Glucose-Capillary: 129 mg/dL — ABNORMAL HIGH (ref 70–99)

## 2013-01-08 LAB — HEPATITIS B CORE ANTIBODY, IGM: Hep B C IgM: NEGATIVE

## 2013-01-08 LAB — HEPATITIS B SURFACE ANTIGEN: Hepatitis B Surface Ag: NEGATIVE

## 2013-01-08 MED ORDER — HEPARIN SODIUM (PORCINE) 1000 UNIT/ML IJ SOLN
INTRAMUSCULAR | Status: AC
  Administered 2013-01-08: 1000 [IU]
  Filled 2013-01-08: qty 1

## 2013-01-08 MED ORDER — ALTEPLASE 2 MG IJ SOLR
2.0000 mg | Freq: Once | INTRAMUSCULAR | Status: AC | PRN
  Filled 2013-01-08: qty 2

## 2013-01-08 MED ORDER — HEPARIN SODIUM (PORCINE) 1000 UNIT/ML DIALYSIS
1000.0000 [IU] | INTRAMUSCULAR | Status: DC | PRN
  Filled 2013-01-08: qty 1

## 2013-01-08 MED ORDER — BIOTENE DRY MOUTH MT LIQD
15.0000 mL | Freq: Two times a day (BID) | OROMUCOSAL | Status: DC
  Administered 2013-01-08 – 2013-01-28 (×35): 15 mL via OROMUCOSAL

## 2013-01-08 MED ORDER — FUROSEMIDE 10 MG/ML IJ SOLN
160.0000 mg | Freq: Four times a day (QID) | INTRAMUSCULAR | Status: DC
  Administered 2013-01-08: 160 mg via INTRAVENOUS
  Filled 2013-01-08 (×3): qty 16

## 2013-01-08 MED ORDER — HEPARIN SODIUM (PORCINE) 1000 UNIT/ML DIALYSIS
2000.0000 [IU] | INTRAMUSCULAR | Status: DC | PRN
  Administered 2013-01-10: 2000 [IU] via INTRAVENOUS_CENTRAL
  Filled 2013-01-08: qty 2

## 2013-01-08 MED ORDER — LIDOCAINE HCL (PF) 1 % IJ SOLN: 5.0000 mL | INTRAMUSCULAR | Status: DC | PRN

## 2013-01-08 MED ORDER — SODIUM CHLORIDE 0.9 % IV SOLN: 100.0000 mL | INTRAVENOUS | Status: DC | PRN

## 2013-01-08 MED ORDER — LIDOCAINE-PRILOCAINE 2.5-2.5 % EX CREA
1.0000 "application " | TOPICAL_CREAM | CUTANEOUS | Status: DC | PRN
  Filled 2013-01-08: qty 5

## 2013-01-08 MED ORDER — NEPRO/CARBSTEADY PO LIQD
237.0000 mL | ORAL | Status: DC | PRN
  Filled 2013-01-08: qty 237

## 2013-01-08 MED ORDER — BOOST / RESOURCE BREEZE PO LIQD
1.0000 | Freq: Three times a day (TID) | ORAL | Status: DC
  Administered 2013-01-08 – 2013-01-24 (×41): 1 via ORAL
  Administered 2013-01-24: 13:00:00 via ORAL
  Administered 2013-01-24 – 2013-01-28 (×11): 1 via ORAL

## 2013-01-08 MED ORDER — PENTAFLUOROPROP-TETRAFLUOROETH EX AERO: 1.0000 "application " | INHALATION_SPRAY | CUTANEOUS | Status: DC | PRN

## 2013-01-08 NOTE — Progress Notes (Signed)
Speech Language Pathology Dysphagia Treatment Patient Details Name: [name] MRN: [id number] DOB: 10/17/1928 Today's Date: 01/08/2013 Time: 1308-6578 SLP Time Calculation (min): 20 min  Assessment / Plan / Recommendation Clinical Impression  Pt transferred to [facility] from [facility], SLP treatment continued per MD order. Pt seen for tolerance of thin liquids, trials of upgraded solid textures per MD note. Pt demonstrates evidence of mild dicoordination of transit of thin liquids, possibly with delayed swallow response. There were no overt signs of dysphagia. Pt consumed liquid via straw functionally. SLP offered trials of puree which were also functional. Mastication and bolus formation of saltine inefficient with diffuse residuals throughout oral cavity requiring thin wash to clear. Pt also wek following CVVHD today. Recommend initating a Dys 2 (minced) diet with thin liquids. Will defer to surgery to write diet as pt likely to have special nutrition needs. Discussed with RN. SLP will f/u for continued tolerance/upgrade.     Diet Recommendation  Initiate / Change Diet: Dysphagia 2 (fine chop);Thin liquid    SLP Plan Continue with current plan of care   Pertinent Vitals/Pain NA   Swallowing Goals  SLP Swallowing Goals Patient will utilize recommended strategies during swallow to increase swallowing safety with: Maximum assistance Swallow Study Goal #2 - Progress: Progressing toward goal  General Temperature Spikes Noted: No Respiratory Status: Supplemental O2 delivered via (comment) Behavior/Cognition: Alert;Cooperative;Pleasant mood Oral Cavity - Dentition: Dentures, top;Dentures, bottom Patient Positioning: Upright in bed  Oral Cavity - Oral Hygiene Does patient have any of the following "at risk" factors?: Oxygen therapy - cannula, mask, simple oxygen devices;Saliva - thick, dry mouth Patient is AT RISK - Oral Care Protocol followed (see row info): Yes   Dysphagia Treatment Treatment  focused on: Skilled observation of diet tolerance;Upgraded PO texture trials;Facilitation of oral phase;Utilization of compensatory strategies Treatment Methods/Modalities: Skilled observation;Differential diagnosis Patient observed directly with PO's: Yes Type of PO's observed: Thin liquids;Dysphagia 3 (soft) Feeding: Able to feed self;Needs assist Liquids provided via: Cup;Straw Oral Phase Signs & Symptoms: Prolonged bolus formation;Prolonged oral phase Pharyngeal Phase Signs & Symptoms: Suspected delayed swallow initiation Type of cueing: Verbal;Tactile Amount of cueing: Moderate   GO     Ilhan Madan, Riley Nearing 01/08/2013, 12:37 PM

## 2013-01-08 NOTE — Progress Notes (Signed)
No issues. Pt. Stable and resting comfortable all night.  Pt. In no respiratory distress. BIPAP not needed at this time.  Pt. Doesn't wear CPAP or BIPAP at home.

## 2013-01-08 NOTE — Progress Notes (Signed)
7 Days Post-Op  Subjective: Alert, mild confusion, has bowel function, tol some liquids, tx to [facility] for [facility]  Objective: Vital signs in last 24 hours: Temp:  [97.4 F (36.3 C)-98 F (36.7 C)] 97.4 F (36.3 C) (04/08 0748) Pulse Rate:  [77-89] 77 (04/08 0800) Resp:  [13-20] 15 (04/08 0800) BP: (138-175)/(52-69) 140/53 mmHg (04/08 0800) SpO2:  [93 %-100 %] 94 % (04/08 0800) Weight:  [169 lb 1.5 oz (76.7 kg)] 169 lb 1.5 oz (76.7 kg) (04/08 0000) Last BM Date: 01/07/13  Intake/Output from previous day: 04/07 0701 - 04/08 0700 In: 1218.5 [I.V.:752.5; IV Piggyback:466] Out: 1410 [Urine:1410] Intake/Output this shift: Total I/O In: -  Out: 60 [Urine:60]  GI: soft approp tender wound clean without infection  Lab Results:   Recent Labs  01/07/13 0830  WBC 12.2*  HGB 12.0  HCT 35.3*  PLT 166   BMET  Recent Labs  01/06/13 0500 01/07/13 0830  NA 135 141  140  K 4.0 3.7  3.8  CL 103 107  106  CO2 15* 14*  14*  GLUCOSE 115* 111*  111*  BUN 84* 95*  97*  CREATININE 3.83* 4.50*  4.52*  CALCIUM 8.7 8.6  8.5   PT/INR No results found for this basename: LABPROT, INR,  in the last 72 hours ABG  Recent Labs  01/06/13 1100  PHART 7.232*  HCO3 12.8*    Studies/Results: No results found.  Anti-infectives: Anti-infectives   Start     Dose/Rate Route Frequency Ordered Stop   01/06/13 1400  piperacillin-tazobactam (ZOSYN) IVPB 2.25 g     2.25 g 100 mL/hr over 30 Minutes Intravenous 3 times per day 01/06/13 1041     01/01/13 0600  piperacillin-tazobactam (ZOSYN) IVPB 2.25 g  Status:  Discontinued     2.25 g 100 mL/hr over 30 Minutes Intravenous 4 times per day 01/01/13 0221 01/06/13 1041   12/31/12 2100  piperacillin-tazobactam (ZOSYN) IVPB 3.375 g     3.375 g 100 mL/hr over 30 Minutes Intravenous  Once 12/31/12 2048 12/31/12 2226   12/31/12 2100  metroNIDAZOLE (FLAGYL) IVPB 500 mg  Status:  Discontinued     500 mg 100 mL/hr over 60 Minutes Intravenous   Once 12/31/12 2048 12/31/12 2309      Assessment/Plan: POD 7 elap/extraction of bezoar  Can advance diet as tolerated per speech  [facility] 01/08/2013

## 2013-01-08 NOTE — Progress Notes (Signed)
PULMONARY  / CRITICAL CARE MEDICINE  Name: [name] MRN: [id number] DOB: 1929/07/17    ADMISSION DATE:  12/31/2012 CONSULTATION DATE:  12/31/2012  REFERRING MD :  EDP PRIMARY SERVICE:  PCCM  CHIEF COMPLAINT:  Abdominal pain/ SBO  BRIEF PATIENT DESCRIPTION: 79 y/o without significant medical problems who developed abdominal pain, nausea, and vomiting 3 days prior to presentation to [facility] ED.  Found to have high grade SBO with pneumatosis and portal venous gas. Exploratory Lap early am 01/01/13, found foreign body imbedded in Small Bowel ?Bezoar. Pt. with hypoxia with LLL airspace disease.  SIGNIFICANT EVENTS / STUDIES:  3/31 - Presented with nausea, vomiting, abdominal pain 3/31 -  Abdominal CT >>> High grade distal SBO and evidence of bowel ischemia 4/01 - Exploratory Lap,  Placed on ARDS Protocol, a fib with RVR >> amiodarone added 4/03 - d/c ARDS protocol 01/04/13 - Weaning pressors.  In sinus rhythm.  Extubated 01/05/13 - improved ur op but still only marginal per RN. Very deconditioned and sleepy but not confused. Off pressors and extubated. DC amio  ECHO - ef 65%, Gr1 diast dysfn and PASP 49 01/07/13: Patient transferred from [facility] to [facility] for possible CVVH with rising serum Cr.   LINES / TUBES: NGT 3/31 >>> Foley 3/31 >>> ETT 01/01/13>>>01/04/13, BIPAP 01/06/13 prn  >> L IJ CVC 01/02/13>>>  CULTURES: 4/2>>> Sputum - e coli pan S 4/2>>>Blood ng 4/2>>> Urine ng  ANTIBIOTICS: Zosyn 3/31 (abd coverage)>>>  INTERVAL HISTORY: Afebrile Denies pain Poor UO Able to lie supine  VITAL SIGNS: Temp:  [97.4 F (36.3 C)-98 F (36.7 C)] 97.4 F (36.3 C) (04/08 0748) Pulse Rate:  [77-89] 83 (04/08 0900) Resp:  [13-20] 18 (04/08 0900) BP: (138-175)/(50-69) 148/50 mmHg (04/08 0900) SpO2:  [93 %-100 %] 95 % (04/08 0900) Weight:  [76.7 kg (169 lb 1.5 oz)] 76.7 kg (169 lb 1.5 oz) (04/08 0000)  INTAKE / OUTPUT: Intake/Output     04/07 0701 - 04/08 0700 04/08 0701 - 04/09 0700   P.O.  80    I.V. (mL/kg) 802.5 (10.5) 110 (1.4)   IV Piggyback 466    Total Intake(mL/kg) 1268.5 (16.5) 190 (2.5)   Urine (mL/kg/hr) 1410 (0.8) 180 (0.8)   Stool     Total Output 1410 180   Net -141.5 +10        Stool Occurrence  1 x    PHYSICAL EXAMINATION: General:  Deconditioined. Neuro:  More alert and awake.. CAm-ICU neg for delirium HEENT:  PERRL, NGT, dry mucous membranes Cardiovascular:  NSR off Amiodarone ( s/p amio 01/01/13 to 01/05/13), No RMG Lungs:  Bilaterally diminished L>R . Some crackles. Mildly labored.  Abdomen:  Distended, hypoactive BS, bilious drainage per NG. Musculoskeletal:  Moves all extremities, no edema noted Skin:  Intact  LABS:  Recent Labs Lab 01/01/13 1745  01/01/13 2300  01/02/13 0455 01/02/13 0945 01/03/13 0344 01/03/13 0950  01/05/13 0453 01/05/13 1045 01/05/13 1313 01/05/13 1655 01/06/13 0500 01/06/13 1100 01/07/13 0830 01/07/13 1756 01/08/13 0857 01/08/13 0858  HGB  --   --   --   < > 12.3  --  10.3*  --   < > 11.5*  --   --   --   --   --  12.0  --  10.6*  --   WBC  --   --   --   < > 11.7*  --  11.5*  --   < > 13.6*  --   --   --   --   --  12.2*  --  9.0  --   PLT  --   --   --   < > 199  --  142*  --   < > 113*  --   --   --   --   --  166  --  129*  --   NA  --   --   --   < > 129*  --  131*  --   < > 136  --   --   --  135  --  141  140  --   --  142  K  --   --   --   < > 3.7  --  2.8*  --   < > 4.3  --   --   --  4.0  --  3.7  3.8  --   --  3.5  CL  --   --   --   < > 95*  --  98  --   < > 103  --   --   --  103  --  107  106  --   --  108  CO2  --   --   --   < > 23  --  18*  --   < > 14*  --   --   --  15*  --  14*  14*  --   --  15*  GLUCOSE  --   --   --   < > 151*  --  115*  --   < > 109*  --   --   --  115*  --  111*  111*  --   --  135*  BUN  --   --   --   < > 79*  --  77*  --   < > 78*  --   --   --  84*  --  95*  97*  --   --  102*  CREATININE  --   --   --   < > 3.04*  --  3.01*  --   < > 3.35*  --   --   --  3.83*  --   4.50*  4.52*  --   --  4.86*  CALCIUM  --   --   --   < > 6.4*  --  6.3*  --   < > 8.1*  --   --   --  8.7  --  8.6  8.5  --   --  8.6  MG  --   --   --   --  1.7  --   --   --   --  1.8  --   --   --  1.9  --   --   --   --   --   PHOS  --   --   --   < > 5.4*  --   --   --   --  7.0*  --   --   --  7.5*  --  7.9* 7.6*  --  7.6*  AST  --   --   --   --   --   --  22  --   --   --   --   --   --   --   --   --   --   --   --  ALT  --   --   --   --   --   --  12  --   --   --   --   --   --   --   --   --   --   --   --   ALKPHOS  --   --   --   --   --   --  55  --   --   --   --   --   --   --   --   --   --   --   --   BILITOT  --   --   --   --   --   --  0.4  --   --   --   --   --   --   --   --   --   --   --   --   PROT  --   --   --   --   --   --  4.2*  --   --   --   --   --   --   --   --   --   --   --   --   ALBUMIN  --   --   --   --   --   --  1.3*  --   --   --   --   --   --   --   --  1.2*  --   --  2.5*  LATICACIDVEN 1.3  --  1.1  --  1.1  --   --   --   --   --   --   --   --   --   --   --   --   --   --   TROPONINI  --   < > <0.30  --   --   --   --   --   --   --  <0.30 <0.30 <0.30  --   --   --   --   --   --   PROCALCITON  --   --   --   --   --   --   --   --   --  15.42  --   --   --  12.41  --  8.55  --   --   --   PROBNP  --   --   --   --   --   --   --   --   --   --   --   --   --  6137.0*  --  6067.0*  --   --   --   PHART  --   < >  --   < >  --  7.429  --  7.334*  --   --   --   --   --   --  7.232*  --   --   --   --   PCO2ART  --   < >  --   < >  --  28.4*  --  31.2*  --   --   --   --   --   --  31.7*  --   --   --   --   PO2ART  --   < >  --   < >  --  116.0*  --  74.8*  --   --   --   --   --   --  85.8  --   --   --   --   < > = values in this interval not displayed.  Recent Labs Lab 01/07/13 1646 01/07/13 2147 01/07/13 2352 01/08/13 0422 01/08/13 0745  GLUCAP 122* 104* 90 125* 129*    Imaging: Dg Chest Port 1 View  01/08/2013  *RADIOLOGY  REPORT*  Clinical Data: Evaluate for edema.  PORTABLE CHEST - 1 VIEW  Comparison: 01/05/2013  Findings: There are persistent opacities throughout the left hemithorax that are concerning for pleural and parenchymal disease. There may be dependent edema at the right lung bases which is unchanged.  The heart is obscured by the left lung densities. Again noted is a heavily calcified thoracic aorta.  The left jugular central venous catheter has an unusual course but this could be related to tortuosity of the left innominate vein. Suspect that the tip is near the junction of the left innominate vein and SVC.  Catheter position is unchanged from the previous examination.  Nasogastric tube has been removed.  IMPRESSION: Diffuse pleural and parenchymal densities in the left hemithorax have not significantly changed.  Persistent haziness at the right lung base could represent edema.  Left central venous catheter as described.   Original Report Authenticated By: Richarda Overlie, M.D.                  ASSESSMENT / PLAN: Principal Problem:   SBO (small bowel obstruction) Active Problems:   PNA (pneumonia)   HTN (hypertension)   Hypoxia   Hyponatremia   Dehydration   Acute renal failure   Hyperglycemia   Acute respiratory failure   Hypovolemic shock   Sepsis(995.91)   Acute blood loss anemia   PULMONARY A:   Suspected atelectasis LLL.   Hypoxemic respiratory failure after surgery. S/p extubation 01/05/13   P: - BIpap day time 2h and then prn + QHS - Aggressive pulm toilet, OOB to chair,  - nasal cannula - BDs  CARDIOVASCULAR  Recent Labs Lab 01/06/13 0500 01/07/13 0830  PROBNP 6137.0* 6067.0*    Recent Labs Lab 01/01/13 2300 01/05/13 1045 01/05/13 1313 01/05/13 1655  TROPONINI <0.30 <0.30 <0.30 <0.30  A:  SIRS / sepsis - Shock state with afib rvr with low cvp: hypovolemic shock, doubt septic shock.  Lactate trending down: 1.1( 4/2).    A-Fib w/RVR  - noted 4/1, resolved 4/2 with Amio gtt.   Negative troponin.  01/05/13  - sinus and dc amio ECHO shows Gr1 diast dysfhn with PASP 49  P: Saline lock and diurese Hold Norvasc, ASA, Crestor, Avapro; restrt when able and can take po > start carefully since she may need HD, may drop BP  RENAL  Recent Labs Lab 01/02/13 0455  01/04/13 0414 01/05/13 0453 01/06/13 0500 01/07/13 0830 01/07/13 1756 01/08/13 0858  NA 129*  < > 134* 136 135 141  140  --  142  K 3.7  < > 3.6 4.3 4.0 3.7  3.8  --  3.5  CL 95*  < > 103 103 103 107  106  --  108  CO2 23  < > 16* 14* 15* 14*  14*  --  15*  GLUCOSE 151*  < > 88 109* 115* 111*  111*  --  135*  BUN 79*  < > 75* 78* 84* 95*  97*  --  102*  CREATININE 3.04*  < > 3.11* 3.35* 3.83* 4.50*  4.52*  --  4.86*  CALCIUM 6.4*  < > 7.0* 8.1* 8.7 8.6  8.5  --  8.6  MG 1.7  --   --  1.8 1.9  --   --   --   PHOS 5.4*  --   --  7.0* 7.5* 7.9* 7.6* 7.6*  < > = values in this interval not displayed. Intake/Output     04/07 0701 - 04/08 0700 04/08 0701 - 04/09 0700   P.O.  80   I.V. (mL/kg) 802.5 (10.5) 110 (1.4)   IV Piggyback 466    Total Intake(mL/kg) 1268.5 (16.5) 190 (2.5)   Urine (mL/kg/hr) 1410 (0.8) 180 (0.8)   Stool     Total Output 1410 180   Net -141.5 +10        Stool Occurrence  1 x     A:   Acute renal failure - in setting of intravascular depletion / ARF. ?? baseline renal function. Cr has risen from 2.4 at admission to 4.5 now. No old labs in system.  Renal U/S with bilateral renal cysts, negative hydronephrosis. She is 28 lbs up in her weight from admission( 141>168 lbs) . CXR shows pulmonary edema.   P:  ' Saline lock Renal on board- Lasix drip was started at 30 mg/hr.  > Per renal - would change her to lasix 160 q4.  Avoid nephrotoxins (d/w pharm 01/06/13): zosyn will be reduced She will likely intermittent HD, will pursue HD catheter   GASTROINTESTINAL  Recent Labs Lab 01/03/13 0344 01/07/13 0830 01/08/13 0858  AST 22  --   --   ALT 12  --   --   ALKPHOS 55   --   --   BILITOT 0.4  --   --   PROT 4.2*  --   --   ALBUMIN 1.3* 1.2* 2.5*   A:   Distal SBO.   Suspected ischemic bowel.  S/p ex lap 4/1 with foreign body removal 01/05/13  -Failed swallow eval, needs repeat  P: - ppi -Currently on thin liquids. Advance diet as tolerated per speech   HEMATOLOGIC  Recent Labs Lab 01/05/13 0453 01/07/13 0830 01/08/13 0857  HGB 11.5* 12.0 10.6*  HCT 34.6* 35.3* 30.4*  WBC 13.6* 12.2* 9.0  PLT 113* 166 129*   No results found for this basename: INR,  in the last 168 hours  A:   H/o DVT, factor V Leiden. Anemia of critical illness THrombocytopenia of critical illness  P:  SQ heparin since 01/01/13 for dvt prph dose PRBC for hgb < 7gm% unless actively bleeding  INFECTIOUS  Recent Labs Lab 01/01/13 1745 01/01/13 2300 01/02/13 0455 01/05/13 0453 01/06/13 0500 01/07/13 0830  LATICACIDVEN 1.3 1.1 1.1  --   --   --   PROCALCITON  --   --   --  15.42 12.41 8.55    Recent Labs Lab 01/03/13 0344 01/04/13 0414 01/05/13 0453 01/07/13 0830 01/08/13 0857  WBC 11.5* 11.0* 13.6* 12.2* 9.0   A:   Suspected aspiration pneumonia 2/2 SBO  PCT dropping- reasuring, ? Renal failure resulting in poor clearance P:  Continue Zosyn, plan total 10 days and than d/c   ENDOCRINE  CBG (last 3)   Recent Labs  01/07/13 2352 01/08/13 0422 01/08/13 0745  GLUCAP 90 125* 129*   A:   Hyperglycemia - No h/o DM. P:   CBG's and SSI  NEUROLOGIC A:   deconditioned.  CAM-ICU neg for delirium P:  PT consult   GLOBAL  Might need inptient rehab 01/08/13- no family at bedside. Renal failure outcome will impact her prognosis   Care during the described time interval was provided by me and/or other providers on the critical care team.  I have reviewed this patient's available data, including medical history, events of note, physical examination and test results as part of my evaluation  CC time 30- minutes  Levy Pupa, MD, PhD 01/08/2013,  10:17 AM [facility] [phone] or if no answer [phone]

## 2013-01-08 NOTE — Progress Notes (Signed)
NUTRITION FOLLOW UP  Intervention:    Recommend diet advancement per SLP to Dysphagia 2 with thin liquids with no dietary restrictions at this time.  Resource Breeze po TID, each supplement provides 250 kcal and 9 grams of protein.  Nutrition Dx:   Inadequate oral intake now related to weakness and dysphagia as evidenced by unable to feed self clear liquid diet.  Goal:   Intake to meet >90% of estimated nutrition needs, unmet.  Monitor:   Diet advancement/PO intake, labs, weight trend.  Assessment:   Patient transferred to [facility] from [facility] for possible CVVHD. CVVHD has not been needed yet.  Is receiving acute HD at this time.  SLP following for dysphagia; recommends diet upgrade to Dysphagia 2 with thin liquids.  Awaiting MD to upgrade diet order.  Height: Ht Readings from Last 1 Encounters:  01/08/13 5\' 3"  (1.6 m)    Weight Status:   Wt Readings from Last 1 Encounters:  01/08/13 166 lb 10.7 oz (75.6 kg)    Re-estimated needs:  Kcal: 1500 Protein: 65-75 gm Fluid: 1.2 L  Skin: abdominal incision  Diet Order: Clear Liquid   Intake/Output Summary (Last 24 hours) at 01/08/13 1400 Last data filed at 01/08/13 1100  Gross per 24 hour  Intake   1266 ml  Output   1165 ml  Net    101 ml    Last BM: 4/8   Labs:   Recent Labs Lab 01/02/13 0455  01/05/13 0453 01/06/13 0500 01/07/13 0830 01/07/13 1756 01/08/13 0858  NA 129*  < > 136 135 141  140  --  142  K 3.7  < > 4.3 4.0 3.7  3.8  --  3.5  CL 95*  < > 103 103 107  106  --  108  CO2 23  < > 14* 15* 14*  14*  --  15*  BUN 79*  < > 78* 84* 95*  97*  --  102*  CREATININE 3.04*  < > 3.35* 3.83* 4.50*  4.52*  --  4.86*  CALCIUM 6.4*  < > 8.1* 8.7 8.6  8.5  --  8.6  MG 1.7  --  1.8 1.9  --   --   --   PHOS 5.4*  --  7.0* 7.5* 7.9* 7.6* 7.6*  GLUCOSE 151*  < > 109* 115* 111*  111*  --  135*  < > = values in this interval not displayed.  CBG (last 3)   Recent Labs  01/08/13 0422 01/08/13 0745  01/08/13 1244  GLUCAP 125* 129* 155*    Scheduled Meds: . albumin human  25 g Intravenous Q6H  . antiseptic oral rinse  15 mL Mouth Rinse QID  . chlorhexidine  15 mL Mouth Rinse BID  . ciprofloxacin  2 drop Both Eyes Q4H while awake  . heparin subcutaneous  5,000 Units Subcutaneous Q8H  . insulin aspart  0-15 Units Subcutaneous Q4H  . pantoprazole (PROTONIX) IV  40 mg Intravenous Q24H  . piperacillin-tazobactam (ZOSYN)  IV  2.25 g Intravenous Q8H  . sodium chloride  10-40 mL Intracatheter Q12H    Continuous Infusions:   Joaquin Courts, RD, LDN, CNSC Pager [phone] After Hours Pager [phone]

## 2013-01-08 NOTE — Progress Notes (Signed)
ANTIBIOTIC CONSULT NOTE - FOLLOW UP  Pharmacy Consult for Zosyn Indication: Empiric aspiration PNA and ischemic bowel coverage  Allergies  Allergen Reactions  . Diovan (Valsartan)   . Hctz (Hydrochlorothiazide)   . Tekamlo (Aliskiren-Amlodipine)   . Valturna (Aliskiren-Valsartan)     Patient Measurements: Height: 5\' 3"  (160 cm) Weight: 166 lb 10.7 oz (75.6 kg) IBW/kg (Calculated) : 52.4  Vital Signs: Temp: 96.6 F (35.9 C) (04/08 1250) Temp src: Oral (04/08 1250) BP: 147/58 mmHg (04/08 1330) Pulse Rate: 81 (04/08 1330) Intake/Output from previous day: 04/07 0701 - 04/08 0700 In: 1268.5 [I.V.:802.5; IV Piggyback:466] Out: 1410 [Urine:1410] Intake/Output from this shift: Total I/O In: 270 [P.O.:80; I.V.:190] Out: 180 [Urine:180]  Labs:  Recent Labs  01/06/13 0500 01/07/13 0830 01/08/13 0857 01/08/13 0858  WBC  --  12.2* 9.0  --   HGB  --  12.0 10.6*  --   PLT  --  166 129*  --   CREATININE 3.83* 4.50*  4.52*  --  4.86*   Estimated Creatinine Clearance: 8.8 ml/min (by C-G formula based on Cr of 4.86). No results found for this basename: VANCOTROUGH, Leodis Binet, VANCORANDOM, GENTTROUGH, GENTPEAK, GENTRANDOM, TOBRATROUGH, TOBRAPEAK, TOBRARND, AMIKACINPEAK, AMIKACINTROU, AMIKACIN,  in the last 72 hours   Microbiology: Recent Results (from the past 720 hour(s))  MRSA PCR SCREENING     Status: None   Collection Time    01/01/13  7:04 AM      Result Value Range Status   MRSA by PCR NEGATIVE  NEGATIVE Final   Comment:            The GeneXpert MRSA Assay (FDA     approved for NASAL specimens     only), is one component of a     comprehensive MRSA colonization     surveillance program. It is not     intended to diagnose MRSA     infection nor to guide or     monitor treatment for     MRSA infections.  CULTURE, RESPIRATORY (NON-EXPECTORATED)     Status: None   Collection Time    01/02/13  9:52 AM      Result Value Range Status   Specimen Description SPU  SUCTIONED   Final   Special Requests NONE   Final   Gram Stain     Final   Value: FEW WBC PRESENT, PREDOMINANTLY PMN     RARE SQUAMOUS EPITHELIAL CELLS PRESENT     NO ORGANISMS SEEN   Culture FEW ESCHERICHIA COLI   Final   Report Status 01/04/2013 FINAL   Final   Organism ID, Bacteria ESCHERICHIA COLI   Final  CULTURE, BLOOD (ROUTINE X 2)     Status: None   Collection Time    01/02/13 10:40 AM      Result Value Range Status   Specimen Description BLOOD RIGHT ARM   Final   Special Requests BOTTLES DRAWN AEROBIC AND ANAEROBIC 10CC   Final   Culture  Setup Time 01/02/2013 15:27   Final   Culture NO GROWTH 5 DAYS   Final   Report Status 01/08/2013 FINAL   Final  CULTURE, BLOOD (ROUTINE X 2)     Status: None   Collection Time    01/02/13 10:58 AM      Result Value Range Status   Specimen Description BLOOD RIGHT ARM   Final   Special Requests BOTTLES DRAWN AEROBIC AND ANAEROBIC 3CC   Final   Culture  Setup Time 01/02/2013 15:26  Final   Culture NO GROWTH 5 DAYS   Final   Report Status 01/08/2013 FINAL   Final  URINE CULTURE     Status: None   Collection Time    01/02/13 12:31 PM      Result Value Range Status   Specimen Description URINE, CATHETERIZED   Final   Special Requests NONE   Final   Culture  Setup Time 01/03/2013 01:11   Final   Colony Count NO GROWTH   Final   Culture NO GROWTH   Final   Report Status 01/03/2013 FINAL   Final    Anti-infectives   Start     Dose/Rate Route Frequency Ordered Stop   01/06/13 1400  piperacillin-tazobactam (ZOSYN) IVPB 2.25 g     2.25 g 100 mL/hr over 30 Minutes Intravenous 3 times per day 01/06/13 1041 01/10/13 2359   01/01/13 0600  piperacillin-tazobactam (ZOSYN) IVPB 2.25 g  Status:  Discontinued     2.25 g 100 mL/hr over 30 Minutes Intravenous 4 times per day 01/01/13 0221 01/06/13 1041   12/31/12 2100  piperacillin-tazobactam (ZOSYN) IVPB 3.375 g     3.375 g 100 mL/hr over 30 Minutes Intravenous  Once 12/31/12 2048 12/31/12 2226    12/31/12 2100  metroNIDAZOLE (FLAGYL) IVPB 500 mg  Status:  Discontinued     500 mg 100 mL/hr over 60 Minutes Intravenous  Once 12/31/12 2048 12/31/12 2309      Assessment: 79 y.o. F who continues on Zosyn D#8 for empiric coverage of aspiration PNA and ischemic bowel. The patient had E.coli growing from respiratory cultures on 4/2 -- covered by Zosyn. A stop date was entered for Zosyn today after 10 days. The patient is afebrile, WBC 9 << 12.2, the patient is starting intermittent HD this a.m -- Zosyn dose remains appropriate.  Goal of Therapy:  Proper antibiotics for infection/cultures adjusted for renal/hepatic function   Plan:  1. Continue Zosyn 2.25g IV every 8 hours 2. Will continue to follow renal function, culture results, LOT, and antibiotic de-escalation plans   Georgina Pillion, PharmD, BCPS Clinical Pharmacist Pager: [phone] 01/08/2013 1:44 PM

## 2013-01-08 NOTE — Progress Notes (Signed)
Subjective: Sitting up in bed, CRRT was not started since pt was not in distress last night on arrival. CXR today still bilateral edema/ASD, but may be slightly better than last film on 4/5  Objective Vital signs in last 24 hours: Filed Vitals:   01/08/13 0600 01/08/13 0700 01/08/13 0748 01/08/13 0800  BP: 139/54 145/55  140/53  Pulse: 78 82  77  Temp:   97.4 F (36.3 C)   TempSrc:   Oral   Resp: 13 15  15   Height:      Weight:      SpO2: 96% 95%  94%   Weight change: 0.5 kg (1 lb 1.6 oz)  Intake/Output Summary (Last 24 hours) at 01/08/13 0910 Last data filed at 01/08/13 0900  Gross per 24 hour  Intake 1178.5 ml  Output   1415 ml  Net -236.5 ml   Labs: Basic Metabolic Panel:  Recent Labs Lab 01/04/13 0414 01/05/13 0453 01/06/13 0500 01/07/13 0830 01/07/13 1756  NA 134* 136 135 141  140  --   K 3.6 4.3 4.0 3.7  3.8  --   CL 103 103 103 107  106  --   CO2 16* 14* 15* 14*  14*  --   GLUCOSE 88 109* 115* 111*  111*  --   BUN 75* 78* 84* 95*  97*  --   CREATININE 3.11* 3.35* 3.83* 4.50*  4.52*  --   CALCIUM 7.0* 8.1* 8.7 8.6  8.5  --   PHOS  --  7.0* 7.5* 7.9* 7.6*   Liver Function Tests:  Recent Labs Lab 01/03/13 0344 01/07/13 0830  AST 22  --   ALT 12  --   ALKPHOS 55  --   BILITOT 0.4  --   PROT 4.2*  --   ALBUMIN 1.3* 1.2*   No results found for this basename: LIPASE, AMYLASE,  in the last 168 hours No results found for this basename: AMMONIA,  in the last 168 hours CBC:  Recent Labs Lab 01/03/13 0344 01/04/13 0414 01/05/13 0453 01/07/13 0830  WBC 11.5* 11.0* 13.6* 12.2*  HGB 10.3* 11.8* 11.5* 12.0  HCT 28.9* 34.8* 34.6* 35.3*  MCV 85.0 87.2 87.6 86.7  PLT 142* 107* 113* 166   PT/INR: @LABRCNTIP (inr:5)   Scheduled Meds ) . albumin human  25 g Intravenous Q6H  . antiseptic oral rinse  15 mL Mouth Rinse QID  . chlorhexidine  15 mL Mouth Rinse BID  . ciprofloxacin  2 drop Both Eyes Q4H while awake  . heparin subcutaneous  5,000  Units Subcutaneous Q8H  . insulin aspart  0-15 Units Subcutaneous Q4H  . pantoprazole (PROTONIX) IV  40 mg Intravenous Q24H  . piperacillin-tazobactam (ZOSYN)  IV  2.25 g Intravenous Q8H  . sodium chloride  10-40 mL Intracatheter Q12H    Physical Exam:  Blood pressure 140/53, pulse 77, temperature 97.4 F (36.3 C), temperature source Oral, resp. rate 15, height 5\' 3"  (1.6 m), weight 76.7 kg (169 lb 1.5 oz), SpO2 94.00%.  Gen: lethargic elderly WF, not in acute distress, frail, weak Skin: no rash, cyanosis  HEENT: EOMI, sclera anicteric, throat slightly dry from mouth breathing  Neck: no JVD, no LAN  Chest: R basilar rales, L base decreased BS  CV: regular, no rub or gallop, pedal pulses intact  Abdomen: soft, moderately distended  Ext: diffuse 2-3+ pitting edema of arms and legs, no joint effusion or deformity, no gangrene or ulceration  Neuro: mild UE asterixis, Ox3  3/31 CT Abd, noncontrast > air in SB, bilat basilar consolidation L > R  4/8 CXR > Left > R bilat basilar infiltrates, slightly better than 4/5 film 4/2 Renal US >> normal kidneys, no hydro  4/5 ECHO > normal LV, normal study  Creatinine 2.41 (3/31) >>> 4.50 (4/7)  I/O +10.9L since admit  Weight 64 >> 76.2 kg   Impression/Plan  1. Renal failure, acute +/- chronic- no baseline. Acute component likely ATN from shock episode earlier in hospital stay.  Making urine. Marked volume overload, +pulm edema, probably some early uremia, +metabolic acidosis. Awaiting labs today,will make decision about dialysis after labs are back.  Lasix drip not working, will resume high dose IV lasix in meantime.  2. SBO s/p exlap and removal of bezoar in jejenum 3. Volume overload / pulm edema and bibasilar PNA- 12 kg up from admission, on Rosalita Chessman  MD [phone] pgr    [phone] cell 01/08/2013, 9:10 AM

## 2013-01-08 NOTE — Clinical Social Work Note (Signed)
[facility] ICU CSW was following for support and possible SNF placement. CSW gave report to 2100 CSW, who will follow up on needs.   Doreen Salvage, LCSW ICU/Stepdown Clinical Social Worker [facility] Cell [phone] Hours 8am-1200pm M-F

## 2013-01-08 NOTE — Progress Notes (Signed)
BUN/Cr up again today, weight up despite lasix. Recommend acute dialysis today. Orders written, temp cath per CCM.  Vinson Moselle  MD [phone] pgr    [phone] cell 01/08/2013, 10:51 AM

## 2013-01-08 NOTE — Progress Notes (Signed)
First hemodialysis treatment complete without adverse events.  Pt tolerated tx well.  Vital signs are stable and pt is currently without complaint.  Report given to Delynn Flavin, RN.

## 2013-01-08 NOTE — Plan of Care (Addendum)
Labs reviewed from patient's PCP office ( Dr. Merri Brunette). Baseline Cr is ~1.00 ( last labs from  June and Nov 2013).  See the attached original reports in patient's chart.   Thank you, Iram Lundberg

## 2013-01-08 NOTE — Progress Notes (Signed)
PT Cancellation Note  Patient Details Name: [name] MRN: [id number] DOB: [birth year]   Cancelled Treatment:    Reason Eval/Treat Not Completed: Medical issues which prohibited therapy;Other (comment) (Nursing in room placing pt on hemodialysis)   INGOLD,Raiden Haydu 01/08/2013, 1:07 PM [facility] [phone] (pager)

## 2013-01-08 NOTE — Procedures (Signed)
PROCEDURE NOTE: CVL PLACEMENT  INDICATION:    Monitoring of central venous pressures and/or administration of medications optimally administered in central vein  CONSENT:   Risks of procedure as well as the alternatives were explained to the patient or surrogate. Consent for procedure obtained. A time out was performed to review patient identification, procedure to be performed, correct patient position, medications/allergies/relevent history, required imaging and test results.  PROCEDURE  Maximum sterile technique was used including antiseptics, cap, gloves, gown, hand hygiene, mask and sheet.  Skin prep: Chlorhexidine; local anesthetic administered  A coated triple lumen catheter was placed in the Right IJ using the Seldinger technique.  Ultrasound was used for vessel identification and guidance.   EVALUATION:  Blood flow good  Complications: No apparent complications  Patient tolerated the procedure well.  Chest X-ray ordered to verify placement and is pending   SAWHNEY,MEGHA 01/08/2013 11:44 AM  Levy Pupa, MD, PhD 01/09/2013, 2:50 PM [facility] [phone] or if no answer [phone]

## 2013-01-09 LAB — GLUCOSE, CAPILLARY
Glucose-Capillary: 210 mg/dL — ABNORMAL HIGH (ref 70–99)
Glucose-Capillary: 97 mg/dL (ref 70–99)
Glucose-Capillary: 192 mg/dL — ABNORMAL HIGH (ref 70–99)
Glucose-Capillary: 103 mg/dL — ABNORMAL HIGH (ref 70–99)

## 2013-01-09 LAB — CBC
HCT: 26.9 % — ABNORMAL LOW (ref 36.0–46.0)
Hemoglobin: 9.5 g/dL — ABNORMAL LOW (ref 12.0–15.0)
MCH: 29.1 pg (ref 26.0–34.0)
MCHC: 35.3 g/dL (ref 30.0–36.0)
RBC: 3.27 MIL/uL — ABNORMAL LOW (ref 3.87–5.11)

## 2013-01-09 LAB — URINE CULTURE: Culture: NO GROWTH

## 2013-01-09 LAB — RENAL FUNCTION PANEL
BUN: 65 mg/dL — ABNORMAL HIGH (ref 6–23)
CO2: 24 mEq/L (ref 19–32)
Calcium: 8.2 mg/dL — ABNORMAL LOW (ref 8.4–10.5)
GFR calc Af Amer: 12 mL/min — ABNORMAL LOW (ref >90)
Glucose, Bld: 95 mg/dL (ref 70–99)
Potassium: 3 mEq/L — ABNORMAL LOW (ref 3.5–5.1)
Sodium: 144 mEq/L (ref 135–145)

## 2013-01-09 MED ORDER — POTASSIUM CHLORIDE 10 MEQ/50ML IV SOLN: 10.0000 meq | INTRAVENOUS | Status: DC

## 2013-01-09 MED ORDER — ASPIRIN EC 81 MG PO TBEC
81.0000 mg | DELAYED_RELEASE_TABLET | Freq: Every day | ORAL | Status: DC
  Administered 2013-01-09 – 2013-01-28 (×20): 81 mg via ORAL
  Filled 2013-01-09 (×20): qty 1

## 2013-01-09 MED ORDER — ATORVASTATIN CALCIUM 10 MG PO TABS
10.0000 mg | ORAL_TABLET | Freq: Every day | ORAL | Status: DC
  Administered 2013-01-09 – 2013-01-28 (×19): 10 mg via ORAL
  Filled 2013-01-09 (×21): qty 1

## 2013-01-09 MED ORDER — AMLODIPINE BESYLATE 10 MG PO TABS
10.0000 mg | ORAL_TABLET | Freq: Every day | ORAL | Status: DC
  Administered 2013-01-09: 10 mg via ORAL
  Filled 2013-01-09 (×2): qty 1

## 2013-01-09 MED ORDER — NEPRO/CARBSTEADY PO LIQD: 237.0000 mL | ORAL | Status: DC | PRN

## 2013-01-09 MED ORDER — HEPARIN SODIUM (PORCINE) 1000 UNIT/ML DIALYSIS: 1000.0000 [IU] | INTRAMUSCULAR | Status: DC | PRN

## 2013-01-09 MED ORDER — INSULIN ASPART 100 UNIT/ML ~~LOC~~ SOLN
0.0000 [IU] | Freq: Three times a day (TID) | SUBCUTANEOUS | Status: DC
  Administered 2013-01-10 (×2): 5 [IU] via SUBCUTANEOUS

## 2013-01-09 MED ORDER — SODIUM CHLORIDE 0.9 % IV SOLN: 100.0000 mL | INTRAVENOUS | Status: DC | PRN

## 2013-01-09 MED ORDER — ALTEPLASE 2 MG IJ SOLR
2.0000 mg | Freq: Once | INTRAMUSCULAR | Status: DC | PRN
  Filled 2013-01-09: qty 2

## 2013-01-09 MED ORDER — PENTAFLUOROPROP-TETRAFLUOROETH EX AERO: 1.0000 "application " | INHALATION_SPRAY | CUTANEOUS | Status: DC | PRN

## 2013-01-09 MED ORDER — POTASSIUM CHLORIDE CRYS ER 20 MEQ PO TBCR
40.0000 meq | EXTENDED_RELEASE_TABLET | Freq: Once | ORAL | Status: AC
  Administered 2013-01-09: 40 meq via ORAL
  Filled 2013-01-09: qty 2

## 2013-01-09 MED ORDER — LIDOCAINE HCL (PF) 1 % IJ SOLN: 5.0000 mL | INTRAMUSCULAR | Status: DC | PRN

## 2013-01-09 MED ORDER — INSULIN ASPART 100 UNIT/ML ~~LOC~~ SOLN: 0.0000 [IU] | Freq: Three times a day (TID) | SUBCUTANEOUS | Status: DC

## 2013-01-09 MED ORDER — LIDOCAINE-PRILOCAINE 2.5-2.5 % EX CREA: 1.0000 "application " | TOPICAL_CREAM | CUTANEOUS | Status: DC | PRN

## 2013-01-09 MED ORDER — HEPARIN SODIUM (PORCINE) 1000 UNIT/ML DIALYSIS
20.0000 [IU]/kg | Freq: Once | INTRAMUSCULAR | Status: AC
  Administered 2013-01-09: 1400 [IU] via INTRAVENOUS_CENTRAL

## 2013-01-09 MED ORDER — INSULIN ASPART 100 UNIT/ML ~~LOC~~ SOLN: 0.0000 [IU] | Freq: Every day | SUBCUTANEOUS | Status: DC

## 2013-01-09 NOTE — Progress Notes (Signed)
8 Days Post-Op  Subjective: Hd yesterday, tol diet, having bowel function  Objective: Vital signs in last 24 hours: Temp:  [96.6 F (35.9 C)-98.2 F (36.8 C)] 97.5 F (36.4 C) (04/09 0732) Pulse Rate:  [77-95] 80 (04/09 0700) Resp:  [15-21] 18 (04/09 0700) BP: (126-167)/(48-68) 140/53 mmHg (04/09 0700) SpO2:  [90 %-100 %] 93 % (04/09 0700) Weight:  [158 lb 4.6 oz (71.8 kg)-166 lb 10.7 oz (75.6 kg)] 158 lb 4.6 oz (71.8 kg) (04/09 0500) Last BM Date: 01/08/13  Intake/Output from previous day: 04/08 0701 - 04/09 0700 In: 936 [P.O.:180; I.V.:406; IV Piggyback:350] Out: 4355 [Urine:950] Intake/Output this shift:    GI: soft bs present nontender wound clean  Lab Results:   Recent Labs  01/08/13 0857 01/09/13 0415  WBC 9.0 9.7  HGB 10.6* 9.5*  HCT 30.4* 26.9*  PLT 129* 123*   BMET  Recent Labs  01/08/13 0858 01/09/13 0415  NA 142 144  K 3.5 3.0*  CL 108 106  CO2 15* 24  GLUCOSE 135* 95  BUN 102* 65*  CREATININE 4.86* 3.79*  CALCIUM 8.6 8.2*   PT/INR No results found for this basename: LABPROT, INR,  in the last 72 hours ABG  Recent Labs  01/06/13 1100  PHART 7.232*  HCO3 12.8*    Studies/Results: Dg Chest Port 1 View  01/08/2013  *RADIOLOGY REPORT*  Clinical Data: Dialysis catheter placement  PORTABLE CHEST - 1 VIEW  Comparison: Prior chest x-ray earlier today 01/08/2013 at 09:11 a.m.  Findings: Interval placement of a non tunneled hemodialysis catheter via a right internal jugular venous approach.  The catheter tip projects over the mid superior vena cava.  Unchanged position of left neck approach central venous catheter.  The tip again projects over the mediastinum given the patient is rotated position.  Negative for complicating pneumothorax.  Very similar appearance of the chest with right to left shift of the cardiac and mediastinal structures, bilateral layering effusions and bibasilar opacities greater on the left than the right with additional areas of  focal consolidation in the periphery of the left upper lung.  IMPRESSION:  1.  New right IJ approach temporary hemodialysis catheter with the tip projecting over the mid superior vena cava. 2.  Negative for pneumothorax. 3.  Otherwise, similar appearance of the chest compared to earlier today.   Original Report Authenticated By: Malachy Moan, M.D.    Dg Chest Port 1 View  01/08/2013  *RADIOLOGY REPORT*  Clinical Data: Evaluate for edema.  PORTABLE CHEST - 1 VIEW  Comparison: 01/05/2013  Findings: There are persistent opacities throughout the left hemithorax that are concerning for pleural and parenchymal disease. There may be dependent edema at the right lung bases which is unchanged.  The heart is obscured by the left lung densities. Again noted is a heavily calcified thoracic aorta.  The left jugular central venous catheter has an unusual course but this could be related to tortuosity of the left innominate vein. Suspect that the tip is near the junction of the left innominate vein and SVC.  Catheter position is unchanged from the previous examination.  Nasogastric tube has been removed.  IMPRESSION: Diffuse pleural and parenchymal densities in the left hemithorax have not significantly changed.  Persistent haziness at the right lung base could represent edema.  Left central venous catheter as described.   Original Report Authenticated By: Richarda Overlie, M.D.     Anti-infectives: Anti-infectives   Start     Dose/Rate Route Frequency Ordered Stop  01/06/13 1400  piperacillin-tazobactam (ZOSYN) IVPB 2.25 g     2.25 g 100 mL/hr over 30 Minutes Intravenous 3 times per day 01/06/13 1041 01/10/13 2359   01/01/13 0600  piperacillin-tazobactam (ZOSYN) IVPB 2.25 g  Status:  Discontinued     2.25 g 100 mL/hr over 30 Minutes Intravenous 4 times per day 01/01/13 0221 01/06/13 1041   12/31/12 2100  piperacillin-tazobactam (ZOSYN) IVPB 3.375 g     3.375 g 100 mL/hr over 30 Minutes Intravenous  Once 12/31/12  2048 12/31/12 2226   12/31/12 2100  metroNIDAZOLE (FLAGYL) IVPB 500 mg  Status:  Discontinued     500 mg 100 mL/hr over 60 Minutes Intravenous  Once 12/31/12 2048 12/31/12 2309      Assessment/Plan: POD 8 elap Continue diet will take staples out soon  [facility] 01/09/2013

## 2013-01-09 NOTE — Progress Notes (Signed)
PULMONARY  / CRITICAL CARE MEDICINE  Name: [name] MRN: [id number] DOB: 07-15-1929    ADMISSION DATE:  12/31/2012 CONSULTATION DATE:  12/31/2012  REFERRING MD :  EDP PRIMARY SERVICE:  PCCM  CHIEF COMPLAINT:  Abdominal pain/ SBO  BRIEF PATIENT DESCRIPTION: 79 y/o without significant medical problems who developed abdominal pain, nausea, and vomiting 3 days prior to presentation to [facility] ED.  Found to have high grade SBO with pneumatosis and portal venous gas. Exploratory Lap early am 01/01/13, found foreign body imbedded in Small Bowel ?Bezoar. Pt. with hypoxia with LLL airspace disease.  SIGNIFICANT EVENTS / STUDIES:  3/31 - Presented with nausea, vomiting, abdominal pain 3/31 -  Abdominal CT >>> High grade distal SBO and evidence of bowel ischemia 4/01 - Exploratory Lap,  Placed on ARDS Protocol, a fib with RVR >> amiodarone added 4/03 - d/c ARDS protocol 01/04/13 - Weaning pressors.  In sinus rhythm.  Extubated 01/05/13 - improved ur op but still only marginal per RN. Very deconditioned and sleepy but not confused. Off pressors and extubated. DC amio  ECHO - ef 65%, Gr1 diast dysfn and PASP 49 01/07/13: Patient transferred from [facility] to [facility] for possible CVVH with rising serum Cr.   LINES / TUBES: NGT 3/31 >>>4/5 Foley 3/31 >>> ETT 01/01/13>>>01/04/13, BIPAP 01/06/13 prn  >> L IJ CVC 01/02/13>>>4/8  CULTURES: 4/2>>> Sputum - e coli pan S 4/2>>>Blood ng 4/2>>> Urine ng  ANTIBIOTICS: Zosyn 3/31 (abd coverage)>>>  INTERVAL HISTORY: Afebrile Denies pain Poor UO Able to lie supine  VITAL SIGNS: Temp:  [96.6 F (35.9 C)-98.2 F (36.8 C)] 97.5 F (36.4 C) (04/09 0732) Pulse Rate:  [77-95] 80 (04/09 0700) Resp:  [15-21] 18 (04/09 0700) BP: (126-167)/(48-68) 140/53 mmHg (04/09 0700) SpO2:  [90 %-100 %] 93 % (04/09 0700) Weight:  [158 lb 4.6 oz (71.8 kg)-166 lb 10.7 oz (75.6 kg)] 158 lb 4.6 oz (71.8 kg) (04/09 0500)  INTAKE / OUTPUT: Intake/Output     04/08 0701 - 04/09 0700 04/09  0701 - 04/10 0700   P.O. 180    I.V. (mL/kg) 406 (5.7)    IV Piggyback 350    Total Intake(mL/kg) 936 (13)    Urine (mL/kg/hr) 950 (0.6)    Other 3405 (2)    Total Output 4355     Net -3419          Stool Occurrence 2 x     PHYSICAL EXAMINATION: General:  Deconditioined. Neuro:  More alert and awake.. CAm-ICU neg for delirium HEENT:  PERRL, NGT, dry mucous membranes Cardiovascular:  NSR off Amiodarone ( s/p amio 01/01/13 to 01/05/13), No RMG Lungs:  Bilaterally diminished L>R . Some crackles. Mildly labored.  Abdomen:  Distended, hypoactive BS, bilious drainage per NG. Musculoskeletal:  Moves all extremities, no edema noted Skin:  Intact  LABS:  Recent Labs Lab 01/02/13 0945  01/03/13 0344 01/03/13 0950  01/05/13 0453 01/05/13 1045 01/05/13 1313 01/05/13 1655 01/06/13 0500 01/06/13 1100 01/07/13 0830 01/07/13 1756 01/08/13 0857 01/08/13 0858 01/09/13 0415  HGB  --   --  10.3*  --   < > 11.5*  --   --   --   --   --  12.0  --  10.6*  --  9.5*  WBC  --   --  11.5*  --   < > 13.6*  --   --   --   --   --  12.2*  --  9.0  --  9.7  PLT  --   --  142*  --   < > 113*  --   --   --   --   --  166  --  129*  --  123*  NA  --   --  131*  --   < > 136  --   --   --  135  --  141  140  --   --  142 144  K  --   --  2.8*  --   < > 4.3  --   --   --  4.0  --  3.7  3.8  --   --  3.5 3.0*  CL  --   --  98  --   < > 103  --   --   --  103  --  107  106  --   --  108 106  CO2  --   --  18*  --   < > 14*  --   --   --  15*  --  14*  14*  --   --  15* 24  GLUCOSE  --   --  115*  --   < > 109*  --   --   --  115*  --  111*  111*  --   --  135* 95  BUN  --   --  77*  --   < > 78*  --   --   --  84*  --  95*  97*  --   --  102* 65*  CREATININE  --   --  3.01*  --   < > 3.35*  --   --   --  3.83*  --  4.50*  4.52*  --   --  4.86* 3.79*  CALCIUM  --   --  6.3*  --   < > 8.1*  --   --   --  8.7  --  8.6  8.5  --   --  8.6 8.2*  MG  --   --   --   --   --  1.8  --   --   --  1.9  --   --    --   --   --   --   PHOS  --   --   --   --   < > 7.0*  --   --   --  7.5*  --  7.9* 7.6*  --  7.6* 5.1*  AST  --   --  22  --   --   --   --   --   --   --   --   --   --   --   --   --   ALT  --   --  12  --   --   --   --   --   --   --   --   --   --   --   --   --   ALKPHOS  --   --  55  --   --   --   --   --   --   --   --   --   --   --   --   --   BILITOT  --   --  0.4  --   --   --   --   --   --   --   --   --   --   --   --   --  PROT  --   --  4.2*  --   --   --   --   --   --   --   --   --   --   --   --   --   ALBUMIN  --   < > 1.3*  --   --   --   --   --   --   --   --  1.2*  --   --  2.5* 2.8*  TROPONINI  --   --   --   --   --   --  <0.30 <0.30 <0.30  --   --   --   --   --   --   --   PROCALCITON  --   --   --   --   --  15.42  --   --   --  12.41  --  8.55  --   --   --   --   PROBNP  --   --   --   --   --   --   --   --   --  6137.0*  --  6067.0*  --   --   --   --   PHART 7.429  --   --  7.334*  --   --   --   --   --   --  7.232*  --   --   --   --   --   PCO2ART 28.4*  --   --  31.2*  --   --   --   --   --   --  31.7*  --   --   --   --   --   PO2ART 116.0*  --   --  74.8*  --   --   --   --   --   --  85.8  --   --   --   --   --   < > = values in this interval not displayed.  Recent Labs Lab 01/08/13 1244 01/08/13 1456 01/08/13 2004 01/08/13 2344 01/09/13 0422  GLUCAP 155* 96 210* 163* 97    Imaging: Dg Chest Port 1 View  01/08/2013  *RADIOLOGY REPORT*  Clinical Data: Dialysis catheter placement  PORTABLE CHEST - 1 VIEW  Comparison: Prior chest x-ray earlier today 01/08/2013 at 09:11 a.m.  Findings: Interval placement of a non tunneled hemodialysis catheter via a right internal jugular venous approach.  The catheter tip projects over the mid superior vena cava.  Unchanged position of left neck approach central venous catheter.  The tip again projects over the mediastinum given the patient is rotated position.  Negative for complicating pneumothorax.  Very  similar appearance of the chest with right to left shift of the cardiac and mediastinal structures, bilateral layering effusions and bibasilar opacities greater on the left than the right with additional areas of focal consolidation in the periphery of the left upper lung.  IMPRESSION:  1.  New right IJ approach temporary hemodialysis catheter with the tip projecting over the mid superior vena cava. 2.  Negative for pneumothorax. 3.  Otherwise, similar appearance of the chest compared to earlier today.   Original Report Authenticated By: Malachy Moan, M.D.    Dg Chest Port 1 View  01/08/2013  *RADIOLOGY REPORT*  Clinical Data: Evaluate for edema.  PORTABLE CHEST - 1 VIEW  Comparison: 01/05/2013  Findings: There are persistent opacities throughout the left hemithorax that are concerning for pleural and parenchymal disease. There may be dependent edema at the right lung bases which is unchanged.  The heart is obscured by the left lung densities. Again noted is a heavily calcified thoracic aorta.  The left jugular central venous catheter has an unusual course but this could be related to tortuosity of the left innominate vein. Suspect that the tip is near the junction of the left innominate vein and SVC.  Catheter position is unchanged from the previous examination.  Nasogastric tube has been removed.  IMPRESSION: Diffuse pleural and parenchymal densities in the left hemithorax have not significantly changed.  Persistent haziness at the right lung base could represent edema.  Left central venous catheter as described.   Original Report Authenticated By: Richarda Overlie, M.D.                  ASSESSMENT / PLAN: Principal Problem:   SBO (small bowel obstruction) Active Problems:   PNA (pneumonia)   HTN (hypertension)   Hypoxia   Hyponatremia   Dehydration   Acute renal failure   Hyperglycemia   Acute respiratory failure   Hypovolemic shock   Sepsis(995.91)   Acute blood loss anemia   PULMONARY A:    Suspected atelectasis LLL.   Hypoxemic respiratory failure after surgery. S/p extubation 01/05/13   P: - BIpap prn, has not require last 48h - Aggressive pulm toilet, OOB to chair,  - nasal cannula - BDs PRN - remove volume w HD as possible  CARDIOVASCULAR  Recent Labs Lab 01/06/13 0500 01/07/13 0830  PROBNP 6137.0* 6067.0*    Recent Labs Lab 01/05/13 1045 01/05/13 1313 01/05/13 1655  TROPONINI <0.30 <0.30 <0.30  A:  SIRS / sepsis - Shock state with afib rvr with low cvp: hypovolemic shock, doubt septic shock.  Lactate trending down: 1.1( 4/2).    A-Fib w/RVR  - noted 4/1, resolved 4/2 with Amio gtt.  Negative troponin.  01/05/13  - sinus and dc amio ECHO shows Gr1 diast dysfhn with PASP 49  P: Saline lock and diurese Would restart norvasc- 4/9, hold Avapro for now given her AKI.  Crestor and ASA restarted - 4/9  RENAL  Recent Labs Lab 01/05/13 0453 01/06/13 0500 01/07/13 0830 01/07/13 1756 01/08/13 0858 01/09/13 0415  NA 136 135 141  140  --  142 144  K 4.3 4.0 3.7  3.8  --  3.5 3.0*  CL 103 103 107  106  --  108 106  CO2 14* 15* 14*  14*  --  15* 24  GLUCOSE 109* 115* 111*  111*  --  135* 95  BUN 78* 84* 95*  97*  --  102* 65*  CREATININE 3.35* 3.83* 4.50*  4.52*  --  4.86* 3.79*  CALCIUM 8.1* 8.7 8.6  8.5  --  8.6 8.2*  MG 1.8 1.9  --   --   --   --   PHOS 7.0* 7.5* 7.9* 7.6* 7.6* 5.1*   Intake/Output     04/08 0701 - 04/09 0700 04/09 0701 - 04/10 0700   P.O. 180    I.V. (mL/kg) 406 (5.7)    IV Piggyback 350    Total Intake(mL/kg) 936 (13)    Urine (mL/kg/hr) 950 (0.6)    Other 3405 (2)    Total Output 4355     Net -3419          Stool Occurrence  2 x      A:   Acute renal failure - in setting of intravascular depletion / ARF. Baseline renal function( Cr ~1.00). Cr has risen from 2.4 at admission to 4.5 now. Cr has started trending down 4.86> 3.79 . Renal U/S with bilateral renal cysts, negative hydronephrosis. She is 28 lbs up in her  weight from admission( 141>168 lbs) . CXR shows pulmonary edema.  She is ~8- 10 lbs down in her weight from yesterday ( 4/8> 4/9) Hypokalemia:  P:   Saline lock Renal on board. Started HD- 4/8, repeat today 4/9 D/c albumin, no indication for this Avoid nephrotoxins (d/w pharm 01/06/13): zosyn will be reduced    GASTROINTESTINAL  Recent Labs Lab 01/03/13 0344 01/07/13 0830 01/08/13 0858 01/09/13 0415  AST 22  --   --   --   ALT 12  --   --   --   ALKPHOS 55  --   --   --   BILITOT 0.4  --   --   --   PROT 4.2*  --   --   --   ALBUMIN 1.3* 1.2* 2.5* 2.8*   A:   Distal SBO.   Suspected ischemic bowel.  S/p ex lap 4/1 with foreign body removal 01/05/13  -Failed swallow eval, needs repeat  P: - ppi -Tolerating diet - CCS following along- plan suture removal soon.   HEMATOLOGIC  Recent Labs Lab 01/07/13 0830 01/08/13 0857 01/09/13 0415  HGB 12.0 10.6* 9.5*  HCT 35.3* 30.4* 26.9*  WBC 12.2* 9.0 9.7  PLT 166 129* 123*   No results found for this basename: INR,  in the last 168 hours  A:   H/o DVT, factor V Leiden. Anemia of critical illness THrombocytopenia of critical illness  P:  SQ heparin since 01/01/13 for dvt prph dose PRBC for hgb < 7gm% unless actively bleeding  INFECTIOUS  Recent Labs Lab 01/05/13 0453 01/06/13 0500 01/07/13 0830  PROCALCITON 15.42 12.41 8.55    Recent Labs Lab 01/04/13 0414 01/05/13 0453 01/07/13 0830 01/08/13 0857 01/09/13 0415  WBC 11.0* 13.6* 12.2* 9.0 9.7   A:   Suspected aspiration pneumonia 2/2 SBO  PCT dropping- reasuring, ? Renal failure resulting in poor clearance P:  Continue Zosyn, plan total 10 days and then d/c (4/10-)   ENDOCRINE  CBG (last 3)   Recent Labs  01/08/13 2004 01/08/13 2344 01/09/13 0422  GLUCAP 210* 163* 97   A:   Hyperglycemia - No h/o DM. P:   Decrease frequency of CBG's   NEUROLOGIC A:   deconditioned. CAM-ICU neg for delirium P:  PT consult   GLOBAL Might need  inptient rehab 01/08/13- no family at bedside. Renal failure outcome will impact her prognosis Transfer to 6700 bed after HD today   Care during the described time interval was provided by me and/or other providers on the critical care team.  I have reviewed this patient's available data, including medical history, events of note, physical examination and test results as part of my evaluation   Levy Pupa, MD, PhD 01/09/2013, 7:43 AM [facility] [phone] or if no answer [phone]

## 2013-01-09 NOTE — Progress Notes (Signed)
[facility] ADULT ICU REPLACEMENT PROTOCOL FOR AM LAB REPLACEMENT ONLY  The patient does not  apply for the [facility] Adult ICU Electrolyte Replacment Protocol based on the criteria listed below:   Is GFR >/= 40 ml/min? no  Patient's GFR today is 10  Is BUN < 60 mg/dL? no  Patient's BUN today is 65 4. Abnormal electrolyte(s): k 3.0  6. If a panic level lab has been reported, has the CCM MD in charge been notified? yes.   Physician:  Dr. Delford Field and Dr. Concepcion Living, Alvis Edgell A 01/09/2013 6:13 AM

## 2013-01-09 NOTE — Evaluation (Signed)
Physical Therapy Evaluation Patient Details Name: [name] MRN: [id number] DOB: 1929-01-04 Today's Date: 01/09/2013 Time: 7829-5621 PT Time Calculation (min): 29 min  PT Assessment / Plan / Recommendation Clinical Impression  Pt is a pleasent 79 y.o. female s/p SBO. Patient demonstrates deficits in functional mobility secondary to pain, weakness, deconitioning and poor activity tolerance. Pt will benefit from continued skilled PT to address deficits. Rec SNF upon discharge.     PT Assessment  Patient needs continued PT services    Follow Up Recommendations  SNF    Does the patient have the potential to tolerate intense rehabilitation      Barriers to Discharge Decreased caregiver support Pt does not have ability to arrange assistance upon discharge    Equipment Recommendations  Rolling walker with 5" wheels    Recommendations for Other Services OT consult   Frequency Min 3X/week    Precautions / Restrictions Restrictions Weight Bearing Restrictions: No   Pertinent Vitals/Pain No pain at this time, some reported during the night      Mobility  Bed Mobility Bed Mobility: Supine to Sit;Sitting - Scoot to Edge of Bed Supine to Sit: 1: +2 Total assist;HOB flat Supine to Sit: Patient Percentage: 30% Sitting - Scoot to Edge of Bed: 3: Mod assist Details for Bed Mobility Assistance: Patient with difficulty maneuvering in bed, max VCs for positioning, and assist for turnk support and hip rotation Transfers Transfers: Sit to Stand;Stand to Sit;Stand Pivot Transfers Sit to Stand: 1: +2 Total assist Sit to Stand: Patient Percentage: 50% Stand to Sit: 1: +2 Total assist Stand to Sit: Patient Percentage: 60% Stand Pivot Transfers: 1: +2 Total assist Stand Pivot Transfers: Patient Percentage: 30% Details for Transfer Assistance: Performed multiple times secondary to patient with BM upon standing. Continued standing activities to assist with hygiene. Patient unable to come to  full upright position and demonstrates bilateral LE weakness with knee buckling upon attempts to pivot.  Ambulation/Gait Ambulation/Gait Assistance: Not tested (comment)    Exercises Total Joint Exercises Ankle Circles/Pumps: AROM;Both;10 reps   PT Diagnosis: Difficulty walking;Abnormality of gait;Generalized weakness;Acute pain  PT Problem List: Decreased strength;Decreased range of motion;Decreased activity tolerance;Decreased balance;Decreased mobility;Pain PT Treatment Interventions: DME instruction;Gait training;Functional mobility training;Therapeutic activities;Therapeutic exercise;Balance training;Patient/family education   PT Goals Acute Rehab PT Goals PT Goal Formulation: With patient Time For Goal Achievement: 01/16/13 Potential to Achieve Goals: Fair Pt will go Supine/Side to Sit: with modified independence PT Goal: Supine/Side to Sit - Progress: Goal set today Pt will go Sit to Supine/Side: with modified independence PT Goal: Sit to Supine/Side - Progress: Goal set today Pt will go Sit to Stand: with modified independence PT Goal: Sit to Stand - Progress: Goal set today Pt will go Stand to Sit: with modified independence PT Goal: Stand to Sit - Progress: Goal set today Pt will Ambulate: >150 feet;with least restrictive assistive device PT Goal: Ambulate - Progress: Goal set today  Visit Information  Last PT Received On: 01/09/13 Assistance Needed: +2    Subjective Data  Subjective: 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 Patient Stated Goal: to back to normal   Prior Functioning  Home Living Lives With: Alone Type of Home: Apartment Home Access: Stairs to enter [employer] of Steps: 2 Home Layout: One level Firefighter: Standard Home Adaptive Equipment: None Prior Function Level of Independence: Independent Able to Take Stairs?: Yes Driving: Yes Vocation: Volunteer work Dominant Hand: Right    Cognition  Cognition Overall Cognitive Status:  Appears  within functional limits for tasks assessed/performed Arousal/Alertness: Awake/alert Orientation Level: Appears intact for tasks assessed;Oriented X4 / Intact Behavior During Session: [facility] for tasks performed    Extremity/Trunk Assessment Right Upper Extremity Assessment RUE ROM/Strength/Tone: [facility] for tasks assessed Left Upper Extremity Assessment LUE ROM/Strength/Tone: WFL for tasks assessed Right Lower Extremity Assessment RLE ROM/Strength/Tone: Deficits RLE ROM/Strength/Tone Deficits: generalized weakness RLE Sensation: Deficits RLE Sensation Deficits: sensory deficits related to edema Left Lower Extremity Assessment LLE ROM/Strength/Tone: Deficits LLE ROM/Strength/Tone Deficits: generalized weakness LLE Sensation: Deficits LLE Sensation Deficits: sensory deficits related to edema Trunk Assessment Trunk Assessment: Kyphotic   Balance Balance Balance Assessed: Yes Static Sitting Balance Static Sitting - Balance Support: Feet supported Static Sitting - Level of Assistance: 4: Min assist Static Sitting - Comment/# of Minutes: 12 minutes EOB, vitals monitored, hygiene performed. Pt with increased BP, and decreased O2 (90%) Pt educated and practicing Pursed Lip Breathing techniques.  End of Session PT - End of Session Equipment Utilized During Treatment: Gait belt;Oxygen Activity Tolerance: Patient limited by fatigue Patient left: in chair;with call bell/phone within reach Nurse Communication: Mobility status;Other (comment) (pt would like to eat breakfast now)  GP     Fabio Asa 01/09/2013, 10:40 AM Charlotte Crumb, PT DPT  [phone]

## 2013-01-09 NOTE — Procedures (Signed)
I was present at this dialysis session. I have reviewed the session itself and made appropriate changes.   Vinson Moselle, MD [employer] 01/09/2013, 4:03 PM

## 2013-01-09 NOTE — Progress Notes (Signed)
Subjective: Looks better per staff. More alert. 3.4 kg off yest with HD, no BP drop.  Objective Vital signs in last 24 hours: Filed Vitals:   01/09/13 1357 01/09/13 1430 01/09/13 1500 01/09/13 1530  BP: 161/82 163/73 148/70 150/72  Pulse: 85 86 84 83  Temp:      TempSrc:      Resp: 19 20  18   Height:      Weight:      SpO2:       Weight change: -1.1 kg (-2 lb 6.8 oz)  Intake/Output Summary (Last 24 hours) at 01/09/13 1603 Last data filed at 01/09/13 1200  Gross per 24 hour  Intake    610 ml  Output   3885 ml  Net  -3275 ml   Labs: Basic Metabolic Panel:  Recent Labs Lab 01/06/13 0500 01/07/13 0830 01/07/13 1756 01/08/13 0858 01/09/13 0415  NA 135 141  140  --  142 144  K 4.0 3.7  3.8  --  3.5 3.0*  CL 103 107  106  --  108 106  CO2 15* 14*  14*  --  15* 24  GLUCOSE 115* 111*  111*  --  135* 95  BUN 84* 95*  97*  --  102* 65*  CREATININE 3.83* 4.50*  4.52*  --  4.86* 3.79*  CALCIUM 8.7 8.6  8.5  --  8.6 8.2*  PHOS 7.5* 7.9* 7.6* 7.6* 5.1*   Liver Function Tests:  Recent Labs Lab 01/03/13 0344 01/07/13 0830 01/08/13 0858 01/09/13 0415  AST 22  --   --   --   ALT 12  --   --   --   ALKPHOS 55  --   --   --   BILITOT 0.4  --   --   --   PROT 4.2*  --   --   --   ALBUMIN 1.3* 1.2* 2.5* 2.8*   No results found for this basename: LIPASE, AMYLASE,  in the last 168 hours No results found for this basename: AMMONIA,  in the last 168 hours CBC:  Recent Labs Lab 01/05/13 0453 01/07/13 0830 01/08/13 0857 01/09/13 0415  WBC 13.6* 12.2* 9.0 9.7  HGB 11.5* 12.0 10.6* 9.5*  HCT 34.6* 35.3* 30.4* 26.9*  MCV 87.6 86.7 83.7 82.3  PLT 113* 166 129* 123*   PT/INR: @LABRCNTIP (inr:5)   Scheduled Meds ) . amLODipine  10 mg Oral Daily  . antiseptic oral rinse  15 mL Mouth Rinse BID  . aspirin EC  81 mg Oral Daily  . atorvastatin  10 mg Oral q1800  . ciprofloxacin  2 drop Both Eyes Q4H while awake  . feeding supplement  1 Container Oral TID BM  .  heparin subcutaneous  5,000 Units Subcutaneous Q8H  . insulin aspart  0-15 Units Subcutaneous Q4H  . piperacillin-tazobactam (ZOSYN)  IV  2.25 g Intravenous Q8H  . sodium chloride  10-40 mL Intracatheter Q12H    Physical Exam:  Blood pressure 150/72, pulse 83, temperature 97 F (36.1 C), temperature source Oral, resp. rate 18, height 5\' 3"  (1.6 m), weight 73.9 kg (162 lb 14.7 oz), SpO2 99.00%.  Gen: lethargic elderly WF, not in acute distress, frail, weak Skin: no rash, cyanosis  HEENT: EOMI, sclera anicteric, throat slightly dry from mouth breathing  Neck: no JVD, no LAN  Chest: R basilar rales, L base decreased BS  CV: regular, no rub or gallop, pedal pulses intact  Abdomen: soft, moderately distended  Ext:  diffuse 2-3+ pitting edema of arms and legs, no joint effusion or deformity, no gangrene or ulceration  Neuro: mild UE asterixis, Ox3   3/31 CT Abd, noncontrast > air in SB, bilat basilar consolidation L > R  4/8 CXR > Left > R bilat basilar infiltrates, slightly better than 4/5 film 4/2 Renal US >> normal kidneys, no hydro  4/5 ECHO > normal LV, normal study  Creatinine 2.41 (3/31) >>> 4.50 (4/7)  I/O +10.9L since admit  Weight 64 >> 76.2 kg   Impression/Plan  1. Acute kidney injury- 2nd HD today. Creat normal 3 mos ago. ATN from shock at time of admission. Making urine. 2. Pulm edema/ volume overload- further UF with HD today 3. AMS- better 4. SBO s/p exlap and removal of bezoar in jejenum 5. Volume overload / pulm edema and bibasilar PNA- 12 kg up from admission, on Rosalita Chessman  MD [phone] pgr    [phone] cell 01/09/2013, 4:03 PM

## 2013-01-10 ENCOUNTER — Inpatient Hospital Stay (HOSPITAL_COMMUNITY): Payer: Medicare Other

## 2013-01-10 LAB — RENAL FUNCTION PANEL
Albumin: 2.7 g/dL — ABNORMAL LOW (ref 3.5–5.2)
BUN: 42 mg/dL — ABNORMAL HIGH (ref 6–23)
Calcium: 8.4 mg/dL (ref 8.4–10.5)
Glucose, Bld: 148 mg/dL — ABNORMAL HIGH (ref 70–99)
Phosphorus: 4.3 mg/dL (ref 2.3–4.6)
Potassium: 3.5 mEq/L (ref 3.5–5.1)
Sodium: 140 mEq/L (ref 135–145)

## 2013-01-10 LAB — GLUCOSE, CAPILLARY
Glucose-Capillary: 158 mg/dL — ABNORMAL HIGH (ref 70–99)
Glucose-Capillary: 163 mg/dL — ABNORMAL HIGH (ref 70–99)
Glucose-Capillary: 221 mg/dL — ABNORMAL HIGH (ref 70–99)
Glucose-Capillary: 249 mg/dL — ABNORMAL HIGH (ref 70–99)

## 2013-01-10 LAB — CBC
HCT: 29.4 % — ABNORMAL LOW (ref 36.0–46.0)
Hemoglobin: 10.1 g/dL — ABNORMAL LOW (ref 12.0–15.0)
MCH: 29.1 pg (ref 26.0–34.0)
MCHC: 34.4 g/dL (ref 30.0–36.0)
RDW: 15.2 % (ref 11.5–15.5)

## 2013-01-10 MED ORDER — HEPARIN SODIUM (PORCINE) 1000 UNIT/ML DIALYSIS: 2000.0000 [IU] | Freq: Once | INTRAMUSCULAR | Status: DC

## 2013-01-10 MED ORDER — NEPRO/CARBSTEADY PO LIQD
237.0000 mL | ORAL | Status: DC | PRN
  Filled 2013-01-10: qty 237

## 2013-01-10 MED ORDER — SODIUM CHLORIDE 0.9 % IV SOLN: 100.0000 mL | INTRAVENOUS | Status: DC | PRN

## 2013-01-10 MED ORDER — LIDOCAINE-PRILOCAINE 2.5-2.5 % EX CREA: 1.0000 "application " | TOPICAL_CREAM | CUTANEOUS | Status: DC | PRN

## 2013-01-10 MED ORDER — HEPARIN SODIUM (PORCINE) 1000 UNIT/ML DIALYSIS: 1000.0000 [IU] | INTRAMUSCULAR | Status: DC | PRN

## 2013-01-10 MED ORDER — ALTEPLASE 2 MG IJ SOLR
2.0000 mg | Freq: Once | INTRAMUSCULAR | Status: DC | PRN
  Filled 2013-01-10: qty 2

## 2013-01-10 MED ORDER — LIDOCAINE HCL (PF) 1 % IJ SOLN: 5.0000 mL | INTRAMUSCULAR | Status: DC | PRN

## 2013-01-10 MED ORDER — PENTAFLUOROPROP-TETRAFLUOROETH EX AERO: 1.0000 "application " | INHALATION_SPRAY | CUTANEOUS | Status: DC | PRN

## 2013-01-10 NOTE — Progress Notes (Signed)
Received pt. As a transfer from 2100.pt. Alert and oriented.pt's skin with out pressure wounds.bottom red and excoriation between legs.redness on both heels.keep monitoring pt. And assessing her needs.

## 2013-01-10 NOTE — Progress Notes (Signed)
Agree with above 

## 2013-01-10 NOTE — Procedures (Signed)
I was present at this dialysis session. I have reviewed the session itself and made appropriate changes.   Vinson Moselle, MD [employer] 01/10/2013, 11:01 AM

## 2013-01-10 NOTE — Progress Notes (Signed)
Chaplain Note:  Chaplain visited with pt's daughter. Pt was OTF recieving hd.  Pt's daughter expressed concern over her mother's changing health status and her mother's willingness to accept lifestyle changes. Chaplain provided spiritual comfort and support for pt's daughter.  Pt's daughter expressed appreciation for chaplain support.  Chaplain made plans to follow up with pt's daughter and pt together.   01/10/13 1130  Clinical Encounter Type  Visited With Family  Visit Type Initial;Spiritual support  Referral From Nurse;Social work  Spiritual Encounters  Spiritual Needs Emotional  Stress Factors  Patient Stress Factors Health changes  Family Stress Factors Family relationships;Loss of control   Verdie Shire, Chaplain [phone]

## 2013-01-10 NOTE — Progress Notes (Addendum)
Subjective: On dialysis, more alert. Wt down 77 > 70 kg, 64 on admit. UOP own to 245 cc yesterday, had been about 1000cc prior  Objective Vital signs in last 24 hours: Filed Vitals:   01/10/13 0930 01/10/13 1000 01/10/13 1030 01/10/13 1059  BP: 120/57 127/53 105/65 108/52  Pulse: 80 85 93 86  Temp:      TempSrc:      Resp: 16 18 17 17   Height:      Weight:      SpO2:       Weight change: -1.7 kg (-3 lb 12 oz)  Intake/Output Summary (Last 24 hours) at 01/10/13 1101 Last data filed at 01/10/13 0534  Gross per 24 hour  Intake    310 ml  Output   3146 ml  Net  -2836 ml   Labs: Basic Metabolic Panel:  Recent Labs Lab 01/07/13 0830 01/07/13 1756 01/08/13 0858 01/09/13 0415 01/10/13 0500  NA 141  140  --  142 144 140  K 3.7  3.8  --  3.5 3.0* 3.5  CL 107  106  --  108 106 104  CO2 14*  14*  --  15* 24 28  GLUCOSE 111*  111*  --  135* 95 148*  BUN 95*  97*  --  102* 65* 42*  CREATININE 4.50*  4.52*  --  4.86* 3.79* 3.27*  CALCIUM 8.6  8.5  --  8.6 8.2* 8.4  PHOS 7.9* 7.6* 7.6* 5.1* 4.3   Liver Function Tests:  Recent Labs Lab 01/08/13 0858 01/09/13 0415 01/10/13 0500  ALBUMIN 2.5* 2.8* 2.7*   No results found for this basename: LIPASE, AMYLASE,  in the last 168 hours No results found for this basename: AMMONIA,  in the last 168 hours CBC:  Recent Labs Lab 01/07/13 0830 01/08/13 0857 01/09/13 0415 01/10/13 0500  WBC 12.2* 9.0 9.7 9.6  HGB 12.0 10.6* 9.5* 10.1*  HCT 35.3* 30.4* 26.9* 29.4*  MCV 86.7 83.7 82.3 84.7  PLT 166 129* 123* 108*   PT/INR: @LABRCNTIP (inr:5)   Scheduled Meds ) . amLODipine  10 mg Oral Daily  . antiseptic oral rinse  15 mL Mouth Rinse BID  . aspirin EC  81 mg Oral Daily  . atorvastatin  10 mg Oral q1800  . ciprofloxacin  2 drop Both Eyes Q4H while awake  . feeding supplement  1 Container Oral TID BM  . heparin  2,000 Units Dialysis Once in dialysis  . heparin subcutaneous  5,000 Units Subcutaneous Q8H  . insulin  aspart  0-15 Units Subcutaneous TID WC  . insulin aspart  0-5 Units Subcutaneous QHS  . piperacillin-tazobactam (ZOSYN)  IV  2.25 g Intravenous Q8H  . sodium chloride  10-40 mL Intracatheter Q12H    Physical Exam:  Blood pressure 108/52, pulse 86, temperature 96.5 F (35.8 C), temperature source Oral, resp. rate 17, height 5\' 3"  (1.6 m), weight 71 kg (156 lb 8.4 oz), SpO2 98.00%.  Gen: alert, no distress Skin: no rash, cyanosis  HEENT: EOMI, sclera anicteric, throat slightly dry from mouth breathing  Neck: no JVD, no LAN  Chest: R basilar rales, L base decreased BS  CV: regular, no rub or gallop, pedal pulses intact  Abdomen: soft, moderately distended  Ext: diffuse 2+ pitting edema of arms and legs, no joint effusion or deformity, no gangrene or ulceration  Neuro: asterixis resolved, Ox3   3/31 CT Abd, noncontrast > air in SB, bilat basilar consolidation L > R  4/8  CXR > Left > R bilat basilar infiltrates, slightly better than 4/5 film 4/2 Renal US >> normal kidneys, no hydro  4/5 ECHO > normal LV, normal study  Creatinine 2.41 (3/31) >>> 4.50 (4/7)    Impression/Plan  1. Acute kidney injury- 3rd HD today. Creat was normal three mos ago. ATN from shock at time of admission. Making urine, but UOP down as volume being removed with HD. Repeat cxr in am. Will hold off on HD tomorrow most likely, see if any signs of recovery. 2. Pulm edema/ volume overload- further UF with HD today 3. AMS- better 4. SBO s/p exlap and removal of bezoar in jejenum 5. Pulm infiltrates- edema +/- PNA, on zosyn 6. HTN- on norvasc, will d/c, keep BP up    Heather Moselle  MD [phone] pgr    [phone] cell 01/10/2013, 11:01 AM

## 2013-01-10 NOTE — Progress Notes (Addendum)
Patient ID: [name], female   DOB: [birth year], 79 y.o.   MRN: [id number] 9 Days Post-Op  Subjective: Pt feels well today.  Ate solid food last night for dinner and tolerated it well.  Passing things from below  Objective: Vital signs in last 24 hours: Temp:  [96.5 F (35.8 C)-98.3 F (36.8 C)] 96.5 F (35.8 C) (04/10 0733) Pulse Rate:  [74-92] 77 (04/10 0900) Resp:  [16-20] 16 (04/10 0900) BP: (110-172)/(46-82) 121/54 mmHg (04/10 0900) SpO2:  [92 %-100 %] 98 % (04/10 0733) Weight:  [154 lb 8.7 oz (70.1 kg)-162 lb 14.7 oz (73.9 kg)] 156 lb 8.4 oz (71 kg) (04/10 0733) Last BM Date: 01/09/13  Intake/Output from previous day: 04/09 0701 - 04/10 0700 In: 380 [P.O.:120; I.V.:120; IV Piggyback:140] Out: 3246 [Urine:245; Stool:1] Intake/Output this shift:    PE: Abd: soft, minimally tender, incision c/d/i with staples, +BS  Lab Results:   Recent Labs  01/09/13 0415 01/10/13 0500  WBC 9.7 9.6  HGB 9.5* 10.1*  HCT 26.9* 29.4*  PLT 123* 108*   BMET  Recent Labs  01/09/13 0415 01/10/13 0500  NA 144 140  K 3.0* 3.5  CL 106 104  CO2 24 28  GLUCOSE 95 148*  BUN 65* 42*  CREATININE 3.79* 3.27*  CALCIUM 8.2* 8.4   PT/INR No results found for this basename: LABPROT, INR,  in the last 72 hours CMP     Component Value Date/Time   NA 140 01/10/2013 0500   K 3.5 01/10/2013 0500   CL 104 01/10/2013 0500   CO2 28 01/10/2013 0500   GLUCOSE 148* 01/10/2013 0500   BUN 42* 01/10/2013 0500   CREATININE 3.27* 01/10/2013 0500   CALCIUM 8.4 01/10/2013 0500   PROT 4.2* 01/03/2013 0344   ALBUMIN 2.7* 01/10/2013 0500   AST 22 01/03/2013 0344   ALT 12 01/03/2013 0344   ALKPHOS 55 01/03/2013 0344   BILITOT 0.4 01/03/2013 0344   GFRNONAA 12* 01/10/2013 0500   GFRAA 14* 01/10/2013 0500   Lipase     Component Value Date/Time   LIPASE 162* 12/31/2012 1940       Studies/Results: Dg Chest Port 1 View  01/08/2013  *RADIOLOGY REPORT*  Clinical Data: Dialysis catheter placement  PORTABLE  CHEST - 1 VIEW  Comparison: Prior chest x-ray earlier today 01/08/2013 at 09:11 a.m.  Findings: Interval placement of a non tunneled hemodialysis catheter via a right internal jugular venous approach.  The catheter tip projects over the mid superior vena cava.  Unchanged position of left neck approach central venous catheter.  The tip again projects over the mediastinum given the patient is rotated position.  Negative for complicating pneumothorax.  Very similar appearance of the chest with right to left shift of the cardiac and mediastinal structures, bilateral layering effusions and bibasilar opacities greater on the left than the right with additional areas of focal consolidation in the periphery of the left upper lung.  IMPRESSION:  1.  New right IJ approach temporary hemodialysis catheter with the tip projecting over the mid superior vena cava. 2.  Negative for pneumothorax. 3.  Otherwise, similar appearance of the chest compared to earlier today.   Original Report Authenticated By: Malachy Moan, M.D.     Anti-infectives: Anti-infectives   Start     Dose/Rate Route Frequency Ordered Stop   01/06/13 1400  piperacillin-tazobactam (ZOSYN) IVPB 2.25 g     2.25 g 100 mL/hr over 30 Minutes Intravenous 3 times per day 01/06/13 1041 01/10/13  2359   01/01/13 0600  piperacillin-tazobactam (ZOSYN) IVPB 2.25 g  Status:  Discontinued     2.25 g 100 mL/hr over 30 Minutes Intravenous 4 times per day 01/01/13 0221 01/06/13 1041   12/31/12 2100  piperacillin-tazobactam (ZOSYN) IVPB 3.375 g     3.375 g 100 mL/hr over 30 Minutes Intravenous  Once 12/31/12 2048 12/31/12 2226   12/31/12 2100  metroNIDAZOLE (FLAGYL) IVPB 500 mg  Status:  Discontinued     500 mg 100 mL/hr over 60 Minutes Intravenous  Once 12/31/12 2048 12/31/12 2309       Assessment/Plan  1. S/p ex lap with enterotomy for bezoar extraction and repair  Plan: 1. Patient is doing well surgically.  Will plan to dc staples tomorrow on POD#  10.  Will plan on seeing the patient Monday, will be prn until then. 2. Cont regular diet and mobilization.   LOS: 10 days    Riane Rung E 01/10/2013, 9:27 AM Pager: [phone]

## 2013-01-10 NOTE — Progress Notes (Signed)
PULMONARY  / CRITICAL CARE MEDICINE  Name: [name] MRN: [id number] DOB: [birth year]    ADMISSION DATE:  12/31/2012 CONSULTATION DATE:  12/31/2012  REFERRING MD :  EDP PRIMARY SERVICE:  PCCM  CHIEF COMPLAINT:  Abdominal pain/ SBO  BRIEF PATIENT DESCRIPTION: 79 y/o without significant medical problems who developed abdominal pain, nausea, and vomiting 3 days prior to presentation to [facility] ED.  Found to have high grade SBO with pneumatosis and portal venous gas. Exploratory Lap early am 01/01/13, found foreign body imbedded in Small Bowel ?Bezoar. Pt. with hypoxia with LLL airspace disease.  SIGNIFICANT EVENTS / STUDIES:  3/31 - Presented with nausea, vomiting, abdominal pain 3/31 -  Abdominal CT >>> High grade distal SBO and evidence of bowel ischemia 4/01 - Exploratory Lap,  Placed on ARDS Protocol, a fib with RVR >> amiodarone added 4/03 - d/c ARDS protocol 01/04/13 - Weaning pressors.  In sinus rhythm.  Extubated 01/05/13 - improved ur op but still only marginal per RN. Very deconditioned and sleepy but not confused. Off pressors and extubated. DC amio  ECHO - ef 65%, Gr1 diast dysfn and PASP 49 01/07/13: Patient transferred from [facility] to [facility] for possible CVVH with rising serum Cr.  01/08/13: HD started for ATN 2/2 shock  LINES / TUBES: NGT 3/31 >>>4/5 Foley 3/31 >>>4/10 ETT 01/01/13>>>01/04/13, BIPAP 01/06/13 prn  >> L IJ CVC 01/02/13>>>4/8  CULTURES: 4/2>>> Sputum - e coli pan S 4/2>>>Blood ng 4/2>>> Urine ng  ANTIBIOTICS: Zosyn 3/31 (abd coverage)>>>  INTERVAL HISTORY: Afebrile Denies pain Poor UO Able to lie supine   Subjective: She is eating her lunch. Denies any chest pain, SOB or abdominal pain . Moving her bowels . HD 4/10  VITAL SIGNS: Temp:  [96.5 F (35.8 C)-98.3 F (36.8 C)] 96.5 F (35.8 C) (04/10 0733) Pulse Rate:  [74-93] 86 (04/10 1059) Resp:  [16-20] 17 (04/10 1059) BP: (105-172)/(46-82) 108/52 mmHg (04/10 1059) SpO2:  [92 %-100 %] 98 % (04/10 0733) Weight:   [154 lb 8.7 oz (70.1 kg)-162 lb 14.7 oz (73.9 kg)] 156 lb 8.4 oz (71 kg) (04/10 0733)  INTAKE / OUTPUT: Intake/Output     04/09 0701 - 04/10 0700 04/10 0701 - 04/11 0700   P.O. 120    I.V. (mL/kg) 120 (1.7)    IV Piggyback 140    Total Intake(mL/kg) 380 (5.4)    Urine (mL/kg/hr) 245 (0.1)    Other 3000 (1.8)    Stool 1 (0)    Total Output 3246     Net -2866          Stool Occurrence 2 x     PHYSICAL EXAMINATION: General:  Deconditioined. Neuro:  More alert and awake.. CAm-ICU neg for delirium HEENT:  PERRL, NGT, dry mucous membranes Cardiovascular:  NSR off Amiodarone ( s/p amio 01/01/13 to 01/05/13), No RMG Lungs:  Bilaterally diminished L>R . Some crackles. Mildly labored.  Abdomen:  Distended, hypoactive BS, bilious drainage per NG. Musculoskeletal:  Moves all extremities, no edema noted Skin:  Intact  LABS:  Recent Labs Lab 01/05/13 0453 01/05/13 1045 01/05/13 1313 01/05/13 1655 01/06/13 0500 01/06/13 1100  01/07/13 0830  01/08/13 0857 01/08/13 0858 01/09/13 0415 01/10/13 0500  HGB 11.5*  --   --   --   --   --   --  12.0  --  10.6*  --  9.5* 10.1*  WBC 13.6*  --   --   --   --   --   --  12.2*  --  9.0  --  9.7 9.6  PLT 113*  --   --   --   --   --   --  166  --  129*  --  123* 108*  NA 136  --   --   --  135  --   --  141  140  --   --  142 144 140  K 4.3  --   --   --  4.0  --   --  3.7  3.8  --   --  3.5 3.0* 3.5  CL 103  --   --   --  103  --   --  107  106  --   --  108 106 104  CO2 14*  --   --   --  15*  --   --  14*  14*  --   --  15* 24 28  GLUCOSE 109*  --   --   --  115*  --   --  111*  111*  --   --  135* 95 148*  BUN 78*  --   --   --  84*  --   --  95*  97*  --   --  102* 65* 42*  CREATININE 3.35*  --   --   --  3.83*  --   --  4.50*  4.52*  --   --  4.86* 3.79* 3.27*  CALCIUM 8.1*  --   --   --  8.7  --   --  8.6  8.5  --   --  8.6 8.2* 8.4  MG 1.8  --   --   --  1.9  --   --   --   --   --   --   --   --   PHOS 7.0*  --   --   --  7.5*   --   --  7.9*  < >  --  7.6* 5.1* 4.3  ALBUMIN  --   --   --   --   --   --   < > 1.2*  --   --  2.5* 2.8* 2.7*  TROPONINI  --  <0.30 <0.30 <0.30  --   --   --   --   --   --   --   --   --   PROCALCITON 15.42  --   --   --  12.41  --   --  8.55  --   --   --   --   --   PROBNP  --   --   --   --  6137.0*  --   --  6067.0*  --   --   --   --   --   PHART  --   --   --   --   --  7.232*  --   --   --   --   --   --   --   PCO2ART  --   --   --   --   --  31.7*  --   --   --   --   --   --   --   PO2ART  --   --   --   --   --  85.8  --   --   --   --   --   --   --   < > =  values in this interval not displayed.  Recent Labs Lab 01/09/13 0730 01/09/13 1155 01/09/13 1709 01/09/13 1909 01/09/13 2333  GLUCAP 117* 192* 103* 133* 158*    Imaging: Dg Chest Port 1 View  01/08/2013  *RADIOLOGY REPORT*  Clinical Data: Dialysis catheter placement  PORTABLE CHEST - 1 VIEW  Comparison: Prior chest x-ray earlier today 01/08/2013 at 09:11 a.m.  Findings: Interval placement of a non tunneled hemodialysis catheter via a right internal jugular venous approach.  The catheter tip projects over the mid superior vena cava.  Unchanged position of left neck approach central venous catheter.  The tip again projects over the mediastinum given the patient is rotated position.  Negative for complicating pneumothorax.  Very similar appearance of the chest with right to left shift of the cardiac and mediastinal structures, bilateral layering effusions and bibasilar opacities greater on the left than the right with additional areas of focal consolidation in the periphery of the left upper lung.  IMPRESSION:  1.  New right IJ approach temporary hemodialysis catheter with the tip projecting over the mid superior vena cava. 2.  Negative for pneumothorax. 3.  Otherwise, similar appearance of the chest compared to earlier today.   Original Report Authenticated By: Malachy Moan, M.D.                  ASSESSMENT /  PLAN: Principal Problem:   SBO (small bowel obstruction) Active Problems:   PNA (pneumonia)   HTN (hypertension)   Hypoxia   Hyponatremia   Dehydration   Acute renal failure   Hyperglycemia   Acute respiratory failure   Hypovolemic shock   Sepsis(995.91)   Acute blood loss anemia   PULMONARY A:   Suspected atelectasis LLL.   Hypoxemic respiratory failure after surgery. S/p extubation 01/05/13   P: - BIpap prn, no longer requiring - Aggressive pulm toilet, OOB to chair,  - nasal cannula - BDs PRN - remove volume w HD as possible  CARDIOVASCULAR  Recent Labs Lab 01/06/13 0500 01/07/13 0830  PROBNP 6137.0* 6067.0*    Recent Labs Lab 01/05/13 1045 01/05/13 1313 01/05/13 1655  TROPONINI <0.30 <0.30 <0.30  A:  SIRS / sepsis - Shock state with afib rvr with low cvp: hypovolemic shock, doubt septic shock.  Lactate trending down: 1.1( 4/2).    A-Fib w/RVR  - noted 4/1, resolved 4/2 with Amio gtt.  Negative troponin.  01/05/13  - sinus and dc amio ECHO shows Gr1 diast dysfhn with PASP 49  P: Saline lock and diurese Would restart norvasc- 4/9, hold Avapro for now given her AKI.  Crestor and ASA restarted - 4/9  RENAL  Recent Labs Lab 01/05/13 0453 01/06/13 0500 01/07/13 0830 01/07/13 1756 01/08/13 0858 01/09/13 0415 01/10/13 0500  NA 136 135 141  140  --  142 144 140  K 4.3 4.0 3.7  3.8  --  3.5 3.0* 3.5  CL 103 103 107  106  --  108 106 104  CO2 14* 15* 14*  14*  --  15* 24 28  GLUCOSE 109* 115* 111*  111*  --  135* 95 148*  BUN 78* 84* 95*  97*  --  102* 65* 42*  CREATININE 3.35* 3.83* 4.50*  4.52*  --  4.86* 3.79* 3.27*  CALCIUM 8.1* 8.7 8.6  8.5  --  8.6 8.2* 8.4  MG 1.8 1.9  --   --   --   --   --   PHOS 7.0* 7.5* 7.9* 7.6*  7.6* 5.1* 4.3   Intake/Output     04/09 0701 - 04/10 0700 04/10 0701 - 04/11 0700   P.O. 120    I.V. (mL/kg) 120 (1.7)    IV Piggyback 140    Total Intake(mL/kg) 380 (5.4)    Urine (mL/kg/hr) 245 (0.1)    Other 3000  (1.8)    Stool 1 (0)    Total Output 3246     Net -2866          Stool Occurrence 2 x      A:   Acute renal failure -  Likely 2/2 ATN from shock. Renal U/S with bilateral renal cysts, negative hydronephrosis. She is 28 lbs up in her weight from admission( 141>168 lbs)CXR shows pulmonary edema.  Baseline renal function( Cr ~1.00) 3 months ago. Cr has risen to 4.5 ( 4/6) from ATN 2/2 shock. Hd started on 4/8. Cr has started trending down 4.86> 3.79> 3.27 .  P:   Saline lock Renal on board. Getting HD for 3 days( started on 4/8) D/c albumin, no indication for this Avoid nephrotoxins (d/w pharm 01/06/13): zosyn will be reduced   GASTROINTESTINAL  Recent Labs Lab 01/07/13 0830 01/08/13 0858 01/09/13 0415 01/10/13 0500  ALBUMIN 1.2* 2.5* 2.8* 2.7*   A:   Distal SBO.   Suspected ischemic bowel.  S/p ex lap 4/1 with foreign body removal 01/05/13  -Failed swallow eval, needs repeat  P: - ppi -Tolerating diet and moving bowels - CCS following along- plan suture removal- 4/10.   HEMATOLOGIC  Recent Labs Lab 01/08/13 0857 01/09/13 0415 01/10/13 0500  HGB 10.6* 9.5* 10.1*  HCT 30.4* 26.9* 29.4*  WBC 9.0 9.7 9.6  PLT 129* 123* 108*    A:   H/o DVT, factor V Leiden. Anemia of critical illness THrombocytopenia of critical illness  P:  SQ heparin since 01/01/13 for dvt prph dose PRBC for hgb < 7gm% unless actively bleeding  INFECTIOUS  Recent Labs Lab 01/05/13 0453 01/06/13 0500 01/07/13 0830  PROCALCITON 15.42 12.41 8.55    Recent Labs Lab 01/05/13 0453 01/07/13 0830 01/08/13 0857 01/09/13 0415 01/10/13 0500  WBC 13.6* 12.2* 9.0 9.7 9.6   A:   Suspected aspiration pneumonia 2/2 SBO  PCT dropping- reasuring, ? Renal failure resulting in poor clearance P:  Continue Zosyn, plan total 10 days and then d/c (4/10-)   ENDOCRINE  CBG (last 3)   Recent Labs  01/09/13 1709 01/09/13 1909 01/09/13 2333  GLUCAP 103* 133* 158*   A:   Hyperglycemia - No  h/o DM. P:   Decrease frequency of CBG's   NEUROLOGIC A:   deconditioned. CAM-ICU neg for delirium. PT recommends SNF.  P:  Continue PT   GLOBAL D/C Foley She would be d/c to SNF  Transfer to med- surg bed   Care during the described time interval was provided by me and/or other providers on the critical care team.  I have reviewed this patient's available data, including medical history, events of note, physical examination and test results as part of my evaluation   Levy Pupa, MD, PhD 01/10/2013, 11:20 AM [facility] [phone] or if no answer [phone]

## 2013-01-10 NOTE — Progress Notes (Signed)
Speech Language Pathology Dysphagia Treatment Patient Details Name: [name] MRN: [id number] DOB: [birth year] Today's Date: 01/10/2013 Time: 4098-1191 SLP Time Calculation (min): 8 min  Assessment / Plan / Recommendation Clinical Impression  Pt demonstrates improved function with thin liquids, no evidence of delayed swallow response, phonation much clearer than previously observed. Again observed pt masticating regular texture; poor bolus cohesion again observed. Pt given choice of upgraded diet and continuing Dys 2 minced diet Pt reports she feels that she need a minced diet due to difficulty chewing with her dentures. Recommend continuing diet as it is. SLP will sign off as pt is tolerating and no further therapeutic intervention is needed.     Diet Recommendation  Continue with Current Diet: Dysphagia 2 (fine chop);Thin liquid    SLP Plan All goals met;Discharge SLP treatment due to (comment)   Pertinent Vitals/Pain NA   Swallowing Goals  SLP Swallowing Goals Patient will utilize recommended strategies during swallow to increase swallowing safety with: Maximum assistance Swallow Study Goal #2 - Progress: Met Goal #3: Pt will demonstrate clear phonation abilities with cough to clear if voice is wet with moderate assistance to aid in determining readiness for po.  Swallow Study Goal #3 - Progress: Met  General Temperature Spikes Noted: No Respiratory Status: Supplemental O2 delivered via (comment) Behavior/Cognition: Alert;Cooperative;Pleasant mood Oral Cavity - Dentition: Dentures, top;Dentures, bottom Patient Positioning: Upright in chair  Oral Cavity - Oral Hygiene Does patient have any of the following "at risk" factors?: Oxygen therapy - cannula, mask, simple oxygen devices   Dysphagia Treatment Treatment focused on: Skilled observation of diet tolerance;Upgraded PO texture trials;Facilitation of oral phase;Utilization of compensatory strategies Treatment  Methods/Modalities: Skilled observation Patient observed directly with PO's: Yes Type of PO's observed: Thin liquids;Dysphagia 3 (soft) Feeding: Able to feed self Liquids provided via: Straw Oral Phase Signs & Symptoms: Prolonged bolus formation;Prolonged oral phase Type of cueing: Verbal Amount of cueing: Minimal   GO    Harlon Ditty, MA CCC-SLP [phone]  Claudine Mouton 01/10/2013, 3:53 PM

## 2013-01-11 LAB — GLUCOSE, CAPILLARY
Glucose-Capillary: 115 mg/dL — ABNORMAL HIGH (ref 70–99)
Glucose-Capillary: 141 mg/dL — ABNORMAL HIGH (ref 70–99)
Glucose-Capillary: 169 mg/dL — ABNORMAL HIGH (ref 70–99)

## 2013-01-11 LAB — HEPATITIS B CORE ANTIBODY, TOTAL: Hep B Core Total Ab: NEGATIVE

## 2013-01-11 LAB — RENAL FUNCTION PANEL
Albumin: 2.4 g/dL — ABNORMAL LOW (ref 3.5–5.2)
BUN: 34 mg/dL — ABNORMAL HIGH (ref 6–23)
Calcium: 8.1 mg/dL — ABNORMAL LOW (ref 8.4–10.5)
Glucose, Bld: 140 mg/dL — ABNORMAL HIGH (ref 70–99)
Phosphorus: 4.1 mg/dL (ref 2.3–4.6)
Potassium: 3.4 mEq/L — ABNORMAL LOW (ref 3.5–5.1)
Sodium: 137 mEq/L (ref 135–145)

## 2013-01-11 LAB — CBC
HCT: 29.1 % — ABNORMAL LOW (ref 36.0–46.0)
Hemoglobin: 10 g/dL — ABNORMAL LOW (ref 12.0–15.0)
MCH: 29.1 pg (ref 26.0–34.0)
MCHC: 34.4 g/dL (ref 30.0–36.0)
RDW: 15 % (ref 11.5–15.5)

## 2013-01-11 NOTE — Progress Notes (Signed)
Physical Therapy Treatment Patient Details Name: TAMARI REDWINE MRN: [id number] DOB: 1929/04/11 Today's Date: 01/11/2013 Time: 1127-1221 PT Time Calculation (min): 54 min  PT Assessment / Plan / Recommendation Comments on Treatment Session  Pt continues to struggle with mobility. Acute PT to continue to follow pt to improve mobility      Follow Up Recommendations  SNF     Does the patient have the potential to tolerate intense rehabilitation     Barriers to Discharge        Equipment Recommendations  Rolling walker with 5" wheels    Recommendations for Other Services OT consult  Frequency Min 3X/week   Plan Discharge plan remains appropriate;Frequency remains appropriate    Precautions / Restrictions Precautions Precautions: Fall Restrictions Weight Bearing Restrictions: No   Pertinent Vitals/Pain No c/o pain.     Mobility  Bed Mobility Bed Mobility: Supine to Sit;Sitting - Scoot to Edge of Bed Supine to Sit: 3: Mod assist;HOB flat;With rails Supine to Sit: Patient Percentage: 50% Sitting - Scoot to Edge of Bed: 3: Mod assist Details for Bed Mobility Assistance: VC's / tactile cues for sequencing  movements of LEs and trunk. Manual facilitation to raise trunk from bed secondary to weaknesss.  Assist to scoot hips to EOB using lift pad.   Transfers Transfers: Sit to Stand;Stand to [employer] Transfers Sit to Stand: 2: Max assist;From bed;With upper extremity assist;From chair/3-in-1;With armrests Sit to Stand: Patient Percentage: 40% Stand to Sit: 4: Min assist;To bed;To chair/3-in-1;Without upper extremity assist;With upper extremity assist Stand to Sit: Patient Percentage: 80% Stand Pivot Transfers: 1: +2 Total assist Stand Pivot Transfers: Patient Percentage: 30% Details for Transfer Assistance: Performed sit <--> stand to/from bed  x 4 trials with assist tio initiate standing and shift wt forward over her BOS. Vcs for hand placement and to push through  bilateral LEs.  Assist to control descent to chair.  Pt performed 3 stand <-->sit tranfers with asisit to conrtoll decent pt to the bed or chailr.     Ambulation/Gait Ambulation/Gait Assistance: Not tested (comment)    Exercises Total Joint Exercises Ankle Circles/Pumps: AROM;Both;10 reps   PT Diagnosis:    PT Problem List:   PT Treatment Interventions:     PT Goals Acute Rehab PT Goals PT Goal Formulation: With patient Time For Goal Achievement: 01/16/13 Potential to Achieve Goals: Fair Pt will go Supine/Side to Sit: with modified independence PT Goal: Supine/Side to Sit - Progress: Progressing toward goal Pt will go Sit to Supine/Side: with modified independence PT Goal: Sit to Supine/Side - Progress: Progressing toward goal Pt will go Sit to Stand: with modified independence PT Goal: Sit to Stand - Progress: Progressing toward goal Pt will go Stand to Sit: with modified independence PT Goal: Stand to Sit - Progress: Progressing toward goal Pt will Ambulate: 16 - 50 feet;with rolling walker PT Goal: Ambulate - Progress: Revised due to lack of progress  Visit Information  Last PT Received On: 01/11/13    Subjective Data  Subjective: I am weak all over, Patient Stated Goal: to back to normal   Cognition  Cognition Overall Cognitive Status: Appears within functional limits for tasks assessed/performed Arousal/Alertness: Awake/alert Orientation Level: Appears intact for tasks assessed;Oriented X4 / Intact Behavior During Session: [facility] for tasks performed    Balance  Balance Balance Assessed: Yes Static Sitting Balance Static Sitting - Balance Support: Feet supported Static Sitting - Level of Assistance: 5: Stand by assistance Static Sitting - Comment/# of Minutes: Several  minutes sitting on EOB With  No lob   Static Standing Balance Static Standing - Balance Support: Bilateral upper extremity supported Static Standing - Level of Assistance: 2: Max assist Static  Standing - Comment/# of Minutes: Pt presents with posture lean in standig .  Pt only able to stand for a few seconds with a RW secondary to severe posteiror lean in standing. .   End of Session PT - End of Session Equipment Utilized During Treatment: Gait belt;Oxygen Activity Tolerance: Patient limited by fatigue Patient left: in chair;with call bell/phone within reach Nurse Communication: Mobility status;Other (comment)   GP     Brogan England 01/11/2013, 3:28 PM Kirk Sampley L. Shifra Swartzentruber DPT [phone]

## 2013-01-11 NOTE — Progress Notes (Signed)
Staples removed. No redness noted at site.  Nine Steristrips placed.

## 2013-01-11 NOTE — Progress Notes (Signed)
NUTRITION FOLLOW UP  Intervention:    Continue Resource Breeze po TID, each supplement provides 250 kcal and 9 grams of protein  Recommend removal of renal restrictions to diet order to improve order choices  RD to continue to follow nutrition care plan  Nutrition Dx:   Inadequate oral intake now related to weakness and dysphagia as evidenced by unable to feed self clear liquid diet. Ongoing.  Goal:   Intake to meet >90% of estimated nutrition needs, improving.  Monitor:   Diet advancement/PO intake, labs, weight trend.  Assessment:   Exp-lap 4/1. Noted foreign body imbedded in small bowel. Extubated 4/4. HD completed 4/8, 4/9, 4/10.  SLP following for dysphagia; continues on Dysphagia 2 with thin liquids.  Pt reports that her appetite is slowly improving.  Receiving Resource Breeze TID.  Height: Ht Readings from Last 1 Encounters:  01/08/13 5\' 3"  (1.6 m)    Weight Status:   Wt Readings from Last 1 Encounters:  01/10/13 155 lb 3.3 oz (70.4 kg)  Wt variable with fluid status.  Re-estimated needs:  Kcal: 1500 - 1700 Protein: 65-75 gm Fluid: 1.2 L  Skin: abdominal incision  Diet Order: Dysphagia 2; thins   Intake/Output Summary (Last 24 hours) at 01/11/13 0936 Last data filed at 01/10/13 2339  Gross per 24 hour  Intake     60 ml  Output   2977 ml  Net  -2917 ml    Last BM: 4/10   Labs:   Recent Labs Lab 01/05/13 0453 01/06/13 0500  01/09/13 0415 01/10/13 0500 01/11/13 0419  NA 136 135  < > 144 140 137  K 4.3 4.0  < > 3.0* 3.5 3.4*  CL 103 103  < > 106 104 101  CO2 14* 15*  < > 24 28 28   BUN 78* 84*  < > 65* 42* 34*  CREATININE 3.35* 3.83*  < > 3.79* 3.27* 3.38*  CALCIUM 8.1* 8.7  < > 8.2* 8.4 8.1*  MG 1.8 1.9  --   --   --   --   PHOS 7.0* 7.5*  < > 5.1* 4.3 4.1  GLUCOSE 109* 115*  < > 95 148* 140*  < > = values in this interval not displayed.  CBG (last 3)   Recent Labs  01/10/13 1656 01/10/13 2219 01/11/13 0743  GLUCAP 249* 115*  214*    Scheduled Meds: . antiseptic oral rinse  15 mL Mouth Rinse BID  . aspirin EC  81 mg Oral Daily  . atorvastatin  10 mg Oral q1800  . ciprofloxacin  2 drop Both Eyes Q4H while awake  . feeding supplement  1 Container Oral TID BM  . heparin subcutaneous  5,000 Units Subcutaneous Q8H  . insulin aspart  0-5 Units Subcutaneous QHS  . sodium chloride  10-40 mL Intracatheter Q12H    Continuous Infusions:  none  Jarold Motto MS, RD, LDN Pager: [phone] After-hours pager: [phone]

## 2013-01-11 NOTE — Progress Notes (Signed)
PULMONARY  / CRITICAL CARE MEDICINE  Name: [name] MRN: [id number] DOB: 1928/10/27    ADMISSION DATE:  12/31/2012 CONSULTATION DATE:  12/31/2012  REFERRING MD :  EDP PRIMARY SERVICE:  PCCM  CHIEF COMPLAINT:  Abdominal pain/ SBO  BRIEF PATIENT DESCRIPTION: 79 y/o without significant medical problems who developed abdominal pain, nausea, and vomiting 3 days prior to presentation to [facility] ED.  Found to have high grade SBO with pneumatosis and portal venous gas. Exploratory Lap early am 01/01/13, found foreign body imbedded in Small Bowel ?Bezoar. Pt. with hypoxia with LLL airspace disease.  SIGNIFICANT EVENTS / STUDIES:  3/31 - Presented with nausea, vomiting, abdominal pain 3/31 -  Abdominal CT >>> High grade distal SBO and evidence of bowel ischemia 4/01 - Exploratory Lap,  Placed on ARDS Protocol, a fib with RVR >> amiodarone added 4/03 - d/c ARDS protocol 01/04/13 - Weaning pressors.  In sinus rhythm.  Extubated 01/05/13 - improved ur op but still only marginal per RN. Very deconditioned and sleepy but not confused. Off pressors and extubated. DC amio  ECHO - ef 65%, Gr1 diast dysfn and PASP 49 01/07/13: Patient transferred from [facility] for possible CVVH with rising serum Cr.  01/08/13: HD started for ATN 2/2 shock  LINES / TUBES: NGT 3/31 >>>4/5 Foley 3/31 >>>4/10 ETT 4/1>>>01/04/13 L IJ CVC 4/2>>>4/8 Rt IJ HD cath 4/8>>>  CULTURES: 4/2>>> Sputum - e coli pan S 4/2>>>Blood ng 4/2>>> Urine ng  ANTIBIOTICS: Zosyn 3/31 (abd coverage)>>>4/10  Subjective: Feeling better.  Denies chest pain, dyspnea.  Abdominal pain improved.  Anxious to go home.  VITAL SIGNS: Temp:  [97 F (36.1 C)-98.5 F (36.9 C)] 97.9 F (36.6 C) (04/11 0527) Pulse Rate:  [79-93] 88 (04/11 0527) Resp:  [16-20] 18 (04/11 0527) BP: (105-147)/(48-85) 109/85 mmHg (04/11 0527) SpO2:  [91 %-99 %] 92 % (04/11 0527) Weight:  [155 lb 3.3 oz (70.4 kg)] 155 lb 3.3 oz (70.4 kg) (04/10 2040) Room Air INTAKE /  OUTPUT: Intake/Output     04/10 0701 - 04/11 0700 04/11 0701 - 04/12 0700   P.O.     I.V. (mL/kg) 10 (0.1)    IV Piggyback 50    Total Intake(mL/kg) 60 (0.9)    Urine (mL/kg/hr)     Other 2977 (1.8)    Stool     Total Output 2977     Net -2917           PHYSICAL EXAMINATION: General: No distress Neuro: Alert, normal strength HEENT: Rt IJ site clean Cardiovascular: s1s2 regular Lungs: no wheeze  Abdomen: mid-line wound site clean with staples in place, non tender Musculoskeletal: no edema Skin: no rashes  LABS:  Recent Labs Lab 01/05/13 0453 01/05/13 1045 01/05/13 1313 01/05/13 1655 01/06/13 0500 01/06/13 1100 01/07/13 0830  01/09/13 0415 01/10/13 0500 01/11/13 0419  HGB 11.5*  --   --   --   --   --  12.0  < > 9.5* 10.1* 10.0*  WBC 13.6*  --   --   --   --   --  12.2*  < > 9.7 9.6 10.2  PLT 113*  --   --   --   --   --  166  < > 123* 108* 135*  NA 136  --   --   --  135  --  141  140  < > 144 140 137  K 4.3  --   --   --  4.0  --  3.7  3.8  < > 3.0* 3.5 3.4*  CL 103  --   --   --  103  --  107  106  < > 106 104 101  CO2 14*  --   --   --  15*  --  14*  14*  < > 24 28 28   GLUCOSE 109*  --   --   --  115*  --  111*  111*  < > 95 148* 140*  BUN 78*  --   --   --  84*  --  95*  97*  < > 65* 42* 34*  CREATININE 3.35*  --   --   --  3.83*  --  4.50*  4.52*  < > 3.79* 3.27* 3.38*  CALCIUM 8.1*  --   --   --  8.7  --  8.6  8.5  < > 8.2* 8.4 8.1*  MG 1.8  --   --   --  1.9  --   --   --   --   --   --   PHOS 7.0*  --   --   --  7.5*  --  7.9*  < > 5.1* 4.3 4.1  ALBUMIN  --   --   --   --   --   --  1.2*  < > 2.8* 2.7* 2.4*  TROPONINI  --  <0.30 <0.30 <0.30  --   --   --   --   --   --   --   PROCALCITON 15.42  --   --   --  12.41  --  8.55  --   --   --   --   PROBNP  --   --   --   --  6137.0*  --  6067.0*  --   --   --   --   PHART  --   --   --   --   --  7.232*  --   --   --   --   --   PCO2ART  --   --   --   --   --  31.7*  --   --   --   --   --   PO2ART   --   --   --   --   --  85.8  --   --   --   --   --   < > = values in this interval not displayed.  Recent Labs Lab 01/10/13 1204 01/10/13 1531 01/10/13 1656 01/10/13 2219 01/11/13 0743  GLUCAP 221* 163* 249* 115* 214*    Imaging: Dg Chest Port 1 View  01/10/2013  *RADIOLOGY REPORT*  Clinical Data: Shortness of breath.  History of edema.  PORTABLE CHEST - 1 VIEW  Comparison: Chest x-ray 01/08/2013.  Findings: There is a right-sided internal jugular central venous catheter with tip terminating in the mid superior vena cava. Previously noted left IJ catheter has been removed.  Lung volumes are slightly low.  Improved aeration throughout the left mid and upper lung, compatible with resolving airspace consolidation from pneumonia.  Bibasilar opacities (left much greater than right) compatible with residual areas of atelectasis and/or consolidation, likely a superimposed small left and trace right pleural effusions. No evidence of pulmonary edema.  Heart size is within normal limits.  The patient is rotated to the left on today's exam, resulting  in distortion of the mediastinal contours and reduced diagnostic sensitivity and specificity for mediastinal pathology. Atherosclerosis in the thoracic aorta.  IMPRESSION: 1.  Support apparatus, as above. 2.  Significantly improved aeration, particularly in the left mid to upper lung, favored to reflect resolving multilobar pneumonia. There are persistent bibasilar (left greater than right) opacities compatible with significant residual areas of atelectasis and/or consolidation, as well as superimposed small left and trace right pleural effusions.   Original Report Authenticated By: Trudie Reed, M.D.                  ASSESSMENT / PLAN:  Acute hypoxic respiratory failure post-op with atelectasis >> improved. Plan: Bronchial hygiene Oxygen as needed to keep SpO2 > 92%  Paroxysmal A fib after surgery >> transiently required amiodarone >> now in sinus  rhythm. Grade 1 diastolic dysfx on Echo 4/05. Hx of hyperlipidemia. Plan: Monitor heart rhythm, blood pressure Continue ASA, lipitor  Acute on chronic renal failure likely from ATN in setting of shock.  HD started 4/08. Plan: HD per renal  Distal SBO s/p laparotomy with foreign body removal and peritonitis. Dysphagia. Plan: Advance diet as tolerated Post-op care per [facility]CS F/u with Speech therapy  Anemia of critical illness and chronic disease. Thrombocytopenia of critical illness. Hx of DVT with Factor V Leiden. Plan: F/u CBC SQ heparin  Septic shock from aspiration pneumonia, and peritonitis >> resolved. Plan: Monitor off Abx  Hyperglycemia >> no hx of DM. Plan: SSI Check HbA1C  Deconditioning. Plan: PT/OT Will need SNF placement  Coralyn Helling, MD [facility] Pulmonary/Critical Care 01/11/2013, 9:25 AM Pager:  [phone] After 3pm call: [phone]

## 2013-01-11 NOTE — Progress Notes (Signed)
Subjective: Up in chair, feeling better, no SOB. ~10kg with HD x 3 so far, last HD yesterday  Objective Vital signs in last 24 hours: Filed Vitals:   01/10/13 1645 01/10/13 2040 01/11/13 0527 01/11/13 1000  BP: 147/56 146/57 109/85 158/79  Pulse: 79 82 88 88  Temp: 98.5 F (36.9 C) 97.6 F (36.4 C) 97.9 F (36.6 C) 98.2 F (36.8 C)  TempSrc: Oral Oral Oral Oral  Resp: 20 20 18 20   Height:      Weight:  70.4 kg (155 lb 3.3 oz)    SpO2: 95% 91% 92% 94%   Weight change: -2.9 kg (-6 lb 6.3 oz)  Intake/Output Summary (Last 24 hours) at 01/11/13 1223 Last data filed at 01/11/13 0900  Gross per 24 hour  Intake    170 ml  Output      2 ml  Net    168 ml   Labs: Basic Metabolic Panel:  Recent Labs Lab 01/08/13 0858 01/09/13 0415 01/10/13 0500 01/11/13 0419  NA 142 144 140 137  K 3.5 3.0* 3.5 3.4*  CL 108 106 104 101  CO2 15* 24 28 28   GLUCOSE 135* 95 148* 140*  BUN 102* 65* 42* 34*  CREATININE 4.86* 3.79* 3.27* 3.38*  CALCIUM 8.6 8.2* 8.4 8.1*  PHOS 7.6* 5.1* 4.3 4.1   Liver Function Tests:  Recent Labs Lab 01/09/13 0415 01/10/13 0500 01/11/13 0419  ALBUMIN 2.8* 2.7* 2.4*   No results found for this basename: LIPASE, AMYLASE,  in the last 168 hours No results found for this basename: AMMONIA,  in the last 168 hours CBC:  Recent Labs Lab 01/08/13 0857 01/09/13 0415 01/10/13 0500 01/11/13 0419  WBC 9.0 9.7 9.6 10.2  HGB 10.6* 9.5* 10.1* 10.0*  HCT 30.4* 26.9* 29.4* 29.1*  MCV 83.7 82.3 84.7 84.6  PLT 129* 123* 108* 135*   PT/INR: @LABRCNTIP (inr:5)   Scheduled Meds ) . antiseptic oral rinse  15 mL Mouth Rinse BID  . aspirin EC  81 mg Oral Daily  . atorvastatin  10 mg Oral q1800  . ciprofloxacin  2 drop Both Eyes Q4H while awake  . feeding supplement  1 Container Oral TID BM  . heparin subcutaneous  5,000 Units Subcutaneous Q8H  . insulin aspart  0-5 Units Subcutaneous QHS  . sodium chloride  10-40 mL Intracatheter Q12H    Physical Exam:   Blood pressure 158/79, pulse 88, temperature 98.2 F (36.8 C), temperature source Oral, resp. rate 20, height 5\' 3"  (1.6 m), weight 70.4 kg (155 lb 3.3 oz), SpO2 94.00%.  Gen: alert, no distress Neck: no JVD, no LAN  Chest: occ rales , mostly clear CV: regular, no rub or gallop, pedal pulses intact  Abdomen: soft, moderately distended  Ext: diffuse 1-2++ pitting edema of arms and legs Neuro: asterixis resolved, Ox3   3/31 CT Abd, noncontrast > air in SB, bilat basilar consolidation L > R  4/8 CXR > Left > R bilat basilar infiltrates, slightly better than 4/5 film 4/2 Renal US >> normal kidneys, no hydro  4/5 ECHO > normal LV, normal study  Creatinine 2.41 (3/31) >>> 4.50 (4/7)    Impression/Plan  1. Acute kidney injury- 3rd HD today. Creat was normal three mos ago. ATN from shock at time of admission. Nonoliguric, but foley out now so not sure of volume. No HD today, will watch for signs of recovery.  2. Pulm edema/ volume overload- improved clinically, repeat CXR in am 3. AMS- resolved 4.  SBO s/p exlap and removal of bezoar in jejenum 5. HTN- d/c'd norvasc, let BP run high side   Vinson Moselle  MD [phone] pgr    [phone] cell 01/11/2013, 12:23 PM

## 2013-01-12 LAB — HEMOGLOBIN A1C
Hgb A1c MFr Bld: 6.2 % — ABNORMAL HIGH (ref <5.7)
Mean Plasma Glucose: 131 mg/dL — ABNORMAL HIGH (ref <117)

## 2013-01-12 LAB — GLUCOSE, CAPILLARY

## 2013-01-12 LAB — RENAL FUNCTION PANEL
CO2: 23 mEq/L (ref 19–32)
Chloride: 98 mEq/L (ref 96–112)
GFR calc Af Amer: 9 mL/min — ABNORMAL LOW (ref >90)
GFR calc non Af Amer: 8 mL/min — ABNORMAL LOW (ref >90)
Sodium: 136 mEq/L (ref 135–145)

## 2013-01-12 NOTE — Progress Notes (Signed)
11 Days Post-Op  Subjective: Looks great, says she has some nausea, eating a dysphasia diet, + BM, staples out and incision looks fine.   Objective: Vital signs in last 24 hours: Temp:  [97.9 F (36.6 C)-98.5 F (36.9 C)] 97.9 F (36.6 C) (04/12 0449) Pulse Rate:  [82-95] 84 (04/12 0449) Resp:  [19-20] 19 (04/12 0449) BP: (152-188)/(66-79) 172/66 mmHg (04/12 0449) SpO2:  [92 %-98 %] 92 % (04/12 0449) Weight:  [154 lb 12.2 oz (70.2 kg)] 154 lb 12.2 oz (70.2 kg) (04/11 2141) Last BM Date: 01/11/13 840 PO, 2 stools recorded, Diet Dysphagia II, afebrile, BP up some, Creatinine 4.64 Intake/Output from previous day: 04/11 0701 - 04/12 0700 In: 840 [P.O.:840] Out: 5 [Urine:3; Stool:2] Intake/Output this shift:    General appearance: alert, cooperative, no distress and oriented and talking today. Resp: clear to auscultation bilaterally and anterior exam GI: soft, non-tender; bowel sounds normal; no masses,  no organomegaly and wound steri stripped and looks good.  Lab Results:   Recent Labs  01/10/13 0500 01/11/13 0419  WBC 9.6 10.2  HGB 10.1* 10.0*  HCT 29.4* 29.1*  PLT 108* 135*    BMET  Recent Labs  01/11/13 0419 01/12/13 0500  NA 137 136  K 3.4* 3.2*  CL 101 98  CO2 28 23  GLUCOSE 140* 128*  BUN 34* 50*  CREATININE 3.38* 4.64*  CALCIUM 8.1* 8.2*   PT/INR No results found for this basename: LABPROT, INR,  in the last 72 hours   Recent Labs Lab 01/08/13 0858 01/09/13 0415 01/10/13 0500 01/11/13 0419 01/12/13 0500  ALBUMIN 2.5* 2.8* 2.7* 2.4* 2.2*     Lipase     Component Value Date/Time   LIPASE 162* 12/31/2012 1940     Studies/Results: Dg Chest Port 1 View  01/10/2013  *RADIOLOGY REPORT*  Clinical Data: Shortness of breath.  History of edema.  PORTABLE CHEST - 1 VIEW  Comparison: Chest x-ray 01/08/2013.  Findings: There is a right-sided internal jugular central venous catheter with tip terminating in the mid superior vena cava. Previously noted  left IJ catheter has been removed.  Lung volumes are slightly low.  Improved aeration throughout the left mid and upper lung, compatible with resolving airspace consolidation from pneumonia.  Bibasilar opacities (left much greater than right) compatible with residual areas of atelectasis and/or consolidation, likely a superimposed small left and trace right pleural effusions. No evidence of pulmonary edema.  Heart size is within normal limits.  The patient is rotated to the left on today's exam, resulting in distortion of the mediastinal contours and reduced diagnostic sensitivity and specificity for mediastinal pathology. Atherosclerosis in the thoracic aorta.  IMPRESSION: 1.  Support apparatus, as above. 2.  Significantly improved aeration, particularly in the left mid to upper lung, favored to reflect resolving multilobar pneumonia. There are persistent bibasilar (left greater than right) opacities compatible with significant residual areas of atelectasis and/or consolidation, as well as superimposed small left and trace right pleural effusions.   Original Report Authenticated By: Trudie Reed, M.D.     Medications: . antiseptic oral rinse  15 mL Mouth Rinse BID  . aspirin EC  81 mg Oral Daily  . atorvastatin  10 mg Oral q1800  . ciprofloxacin  2 drop Both Eyes Q4H while awake  . feeding supplement  1 Container Oral TID BM  . heparin subcutaneous  5,000 Units Subcutaneous Q8H  . insulin aspart  0-5 Units Subcutaneous QHS  . sodium chloride  10-40 mL Intracatheter  Q12H    Assessment/Plan 1. S/p ex lap with enterotomy for bezoar extraction and repair, POD11 EXPLORATORY LAPAROTOMY removal of smal bowel foreign body; Pathology showed phytobezoar 01/01/13. Dr. Biagio Quint  Shock Acute respiratory failure/Extubated with FM/Hypoxia improving  Acute renal failure; currently having HD Leukocytosis  HTN (hypertension) BP is stable  On Amiodarone drip, looks like sinus on monitor currently  Hyponatremia   Dehydration  Acute renal failure  Hyperglycemia  Hx of DVT  Factor V leiden  Dysphonia  Plan:  Doing fine from our standpoint.  We will see her again Monday.   LOS: 12 days    JENNINGS,WILLARD 01/12/2013  Tolerating diet but abdomen mildly distended.  Cautious on the diet but otherwise looks okay.  Kidney issues the main problem now.

## 2013-01-12 NOTE — Progress Notes (Signed)
PULMONARY  / CRITICAL CARE MEDICINE  Name: [name] MRN: [id number] DOB: 12/18/1928    ADMISSION DATE:  12/31/2012 CONSULTATION DATE:  12/31/2012  REFERRING MD :  EDP PRIMARY SERVICE:  PCCM  CHIEF COMPLAINT:  Abdominal pain/ SBO  BRIEF PATIENT DESCRIPTION: 79 y/o without significant medical problems who developed abdominal pain, nausea, and vomiting 3 days prior to presentation to [facility] ED.  Found to have high grade SBO with pneumatosis and portal venous gas. Exploratory Lap early am 01/01/13, found foreign body imbedded in Small Bowel ?Bezoar. Pt. with hypoxia with LLL airspace disease post op  SIGNIFICANT EVENTS / STUDIES:  3/31 - Presented with nausea, vomiting, abdominal pain 3/31 -  Abdominal CT >>> High grade distal SBO and evidence of bowel ischemia 4/01 - Exploratory Lap,  Placed on ARDS Protocol, a fib with RVR >> amiodarone added 4/03 - d/c ARDS protocol 01/04/13 - Weaning pressors.  In sinus rhythm.  Extubated 01/05/13 - improved uop but still only marginal per RN. Very deconditioned and sleepy but not confused. Off pressors and extubated. DC amio  ECHO - ef 65%, Gr1 diast dysfn and PASP 49 01/07/13: Patient transferred from [facility] to [facility] for possible CVVH with rising serum Cr.  01/08/13: HD started for ATN 2/2 shock  LINES / TUBES: NGT 3/31 >>>4/5 Foley 3/31 >>>4/10 ETT 4/1>>>01/04/13 L IJ CVC 4/2>>>4/8 Rt IJ HD cath 4/8>>>  CULTURES: 4/2>>> Sputum - e coli pan S 4/2>>>Blood ng 4/2>>> Urine ng  ANTIBIOTICS: Zosyn 3/31 (abd coverage)>>>4/10  Subjective: Wants a coke, eating well, no resp complaints   VITAL SIGNS: Temp:  [97.9 F (36.6 C)-98.5 F (36.9 C)] 97.9 F (36.6 C) (04/12 0449) Pulse Rate:  [82-95] 84 (04/12 0449) Resp:  [19-20] 19 (04/12 0449) BP: (152-188)/(66-76) 172/66 mmHg (04/12 0449) SpO2:  [92 %-98 %] 92 % (04/12 0449) Weight:  [154 lb 12.2 oz (70.2 kg)] 154 lb 12.2 oz (70.2 kg) (04/11 2141) FIO2  Room Air  INTAKE / OUTPUT: Intake/Output     04/11  0701 - 04/12 0700 04/12 0701 - 04/13 0700   P.O. 840    I.V. (mL/kg)     IV Piggyback     Total Intake(mL/kg) 840 (12)    Urine (mL/kg/hr) 3 (0)    Other     Stool 2 (0)    Total Output 5     Net +835           PHYSICAL EXAMINATION: General: No distress Neuro: Alert, normal strength HEENT: Rt IJ site clean Cardiovascular: s1s2 regular Lungs: no wheeze  Abdomen: mid-line wound site clean with staples in place, non tender Musculoskeletal: no edema Skin: no rashes  LABS:  Recent Labs Lab 01/05/13 1045 01/05/13 1313 01/05/13 1655  01/06/13 0500 01/06/13 1100 01/07/13 0830  01/09/13 0415 01/10/13 0500 01/11/13 0419 01/12/13 0500  HGB  --   --   --   --   --   --  12.0  < > 9.5* 10.1* 10.0*  --   WBC  --   --   --   --   --   --  12.2*  < > 9.7 9.6 10.2  --   PLT  --   --   --   --   --   --  166  < > 123* 108* 135*  --   NA  --   --   --   < > 135  --  141  140  < >  144 140 137 136  K  --   --   --   < > 4.0  --  3.7  3.8  < > 3.0* 3.5 3.4* 3.2*  CL  --   --   --   < > 103  --  107  106  < > 106 104 101 98  CO2  --   --   --   < > 15*  --  14*  14*  < > 24 28 28 23   GLUCOSE  --   --   --   < > 115*  --  111*  111*  < > 95 148* 140* 128*  BUN  --   --   --   < > 84*  --  95*  97*  < > 65* 42* 34* 50*  CREATININE  --   --   --   < > 3.83*  --  4.50*  4.52*  < > 3.79* 3.27* 3.38* 4.64*  CALCIUM  --   --   --   < > 8.7  --  8.6  8.5  < > 8.2* 8.4 8.1* 8.2*  MG  --   --   --   --  1.9  --   --   --   --   --   --   --   PHOS  --   --   --   < > 7.5*  --  7.9*  < > 5.1* 4.3 4.1 5.7*  ALBUMIN  --   --   --   --   --   --  1.2*  < > 2.8* 2.7* 2.4* 2.2*  TROPONINI <0.30 <0.30 <0.30  --   --   --   --   --   --   --   --   --   PROCALCITON  --   --   --   --  12.41  --  8.55  --   --   --   --   --   PROBNP  --   --   --   --  6137.0*  --  6067.0*  --   --   --   --   --   PHART  --   --   --   --   --  7.232*  --   --   --   --   --   --   PCO2ART  --   --   --   --    --  31.7*  --   --   --   --   --   --   PO2ART  --   --   --   --   --  85.8  --   --   --   --   --   --   < > = values in this interval not displayed.  Recent Labs Lab 01/11/13 0743 01/11/13 1144 01/11/13 1709 01/11/13 2129 01/12/13 0749  GLUCAP 214* 141* 169* 144* 156*    Imaging: Dg Chest Port 1 View  01/10/2013  *RADIOLOGY REPORT*  Clinical Data: Shortness of breath.  History of edema.  PORTABLE CHEST - 1 VIEW  Comparison: Chest x-ray 01/08/2013.  Findings: There is a right-sided internal jugular central venous catheter with tip terminating in the mid superior vena cava. Previously noted left IJ catheter has been removed.  Lung volumes are slightly low.  Improved aeration throughout the left mid and upper lung, compatible with resolving airspace consolidation from pneumonia.  Bibasilar opacities (left much greater than right) compatible with residual areas of atelectasis and/or consolidation, likely a superimposed small left and trace right pleural effusions. No evidence of pulmonary edema.  Heart size is within normal limits.  The patient is rotated to the left on today's exam, resulting in distortion of the mediastinal contours and reduced diagnostic sensitivity and specificity for mediastinal pathology. Atherosclerosis in the thoracic aorta.  IMPRESSION: 1.  Support apparatus, as above. 2.  Significantly improved aeration, particularly in the left mid to upper lung, favored to reflect resolving multilobar pneumonia. There are persistent bibasilar (left greater than right) opacities compatible with significant residual areas of atelectasis and/or consolidation, as well as superimposed small left and trace right pleural effusions.   Original Report Authenticated By: Trudie Reed, M.D.                  ASSESSMENT / PLAN:  Acute hypoxic respiratory failure> resolved  post-op with atelectasis >> improved. Plan: Bronchial hygiene   Paroxysmal A fib after surgery >> transiently  required amiodarone >> now in sinus rhythm. Grade 1 diastolic dysfx on Echo 4/05. Hx of hyperlipidemia. Plan: Monitor heart rhythm, blood pressure Continue ASA, lipitor  Acute on chronic renal failure likely from ATN in setting of shock.  HD started 4/08. Plan: HD per renal  Distal SBO s/p laparotomy 4/1 with foreign body removal and peritonitis. Dysphagia. Plan: Advance diet as tolerated Post-op care per [facility] on 4/14 F/u with Speech therapy  Anemia of critical illness and chronic disease. Thrombocytopenia of critical illness. Hx of DVT with Factor V Leiden. Plan: F/u CBC SQ heparin  Septic shock from aspiration pneumonia, and peritonitis >> resolved. Plan: Monitor off Abx  Hyperglycemia >> no hx of DM. Plan: SSI hgbA1c 4/12  >>>   Deconditioning. Plan: PT/OT Will need SNF placement   Sandrea Hughs, MD Pulmonary and Critical Care Medicine  Healthcare Cell [phone] After 5:30 PM or weekends, call [phone]

## 2013-01-12 NOTE — Progress Notes (Signed)
Subjective: No complaints, voided 3x yesterday in diaper, weight stable at 70kg  Objective Vital signs in last 24 hours: Filed Vitals:   01/11/13 1946 01/11/13 2141 01/12/13 0449 01/12/13 1000  BP: 175/67  172/66 105/88  Pulse: 85  84 20  Temp: 98.5 F (36.9 C)  97.9 F (36.6 C) 98.2 F (36.8 C)  TempSrc: Oral  Oral Oral  Resp: 20  19 20   Height:      Weight:  70.2 kg (154 lb 12.2 oz)    SpO2: 93%  92% 96%   Weight change: -0.8 kg (-1 lb 12.2 oz)  Intake/Output Summary (Last 24 hours) at 01/12/13 1123 Last data filed at 01/12/13 1000  Gross per 24 hour  Intake    960 ml  Output      4 ml  Net    956 ml   Labs: Basic Metabolic Panel:  Recent Labs Lab 01/09/13 0415 01/10/13 0500 01/11/13 0419 01/12/13 0500  NA 144 140 137 136  K 3.0* 3.5 3.4* 3.2*  CL 106 104 101 98  CO2 24 28 28 23   GLUCOSE 95 148* 140* 128*  BUN 65* 42* 34* 50*  CREATININE 3.79* 3.27* 3.38* 4.64*  CALCIUM 8.2* 8.4 8.1* 8.2*  PHOS 5.1* 4.3 4.1 5.7*   Liver Function Tests:  Recent Labs Lab 01/10/13 0500 01/11/13 0419 01/12/13 0500  ALBUMIN 2.7* 2.4* 2.2*   No results found for this basename: LIPASE, AMYLASE,  in the last 168 hours No results found for this basename: AMMONIA,  in the last 168 hours CBC:  Recent Labs Lab 01/08/13 0857 01/09/13 0415 01/10/13 0500 01/11/13 0419  WBC 9.0 9.7 9.6 10.2  HGB 10.6* 9.5* 10.1* 10.0*  HCT 30.4* 26.9* 29.4* 29.1*  MCV 83.7 82.3 84.7 84.6  PLT 129* 123* 108* 135*   PT/INR: @LABRCNTIP (inr:5)   Scheduled Meds ) . antiseptic oral rinse  15 mL Mouth Rinse BID  . aspirin EC  81 mg Oral Daily  . atorvastatin  10 mg Oral q1800  . ciprofloxacin  2 drop Both Eyes Q4H while awake  . feeding supplement  1 Container Oral TID BM  . heparin subcutaneous  5,000 Units Subcutaneous Q8H  . insulin aspart  0-5 Units Subcutaneous QHS  . sodium chloride  10-40 mL Intracatheter Q12H    Physical Exam:  Blood pressure 105/88, pulse 20, temperature 98.2  F (36.8 C), temperature source Oral, resp. rate 20, height 5\' 3"  (1.6 m), weight 70.2 kg (154 lb 12.2 oz), SpO2 96.00%.  Gen: alert, no distress Neck: no JVD, no LAN  Chest: mostly clear CV: regular, no rub or gallop, pedal pulses intact  Abdomen: soft, moderately distended  Ext: 2+ edema legs, mild edema of arms bilat Neuro: oriented, very weak   3/31 CT Abd, noncontrast > air in SB, bilat basilar consolidation L > R  4/8 CXR > Left > R bilat basilar infiltrates, slightly better than 4/5 film 4/2 Renal US >> normal kidneys, no hydro  4/5 ECHO > normal LV, normal study  Creatinine 2.41 (3/31) >>> 4.50 (4/7)    Impression/Plan  1. Acute kidney injury- s/p HD x 3. Creat was normal three mos ago. ATN from shock at time of admission. Foley out, watch renal function over w/e. Still has extra volume but cxr clear now and doesn't need aggressive UF. Next HD Monday if needed 2. Pulm edema- resolved by cxr 4/10.  3. AMS- resolved 4. SBO s/p exlap and removal of bezoar in jejenum  5. HTN- d/c'd norvasc, let BP run high side   Vinson Moselle  MD [phone] pgr    [phone] cell 01/12/2013, 11:23 AM

## 2013-01-13 LAB — RENAL FUNCTION PANEL
BUN: 67 mg/dL — ABNORMAL HIGH (ref 6–23)
CO2: 22 mEq/L (ref 19–32)
Chloride: 97 mEq/L (ref 96–112)
Creatinine, Ser: 5.89 mg/dL — ABNORMAL HIGH (ref 0.50–1.10)
GFR calc non Af Amer: 6 mL/min — ABNORMAL LOW (ref >90)

## 2013-01-13 LAB — GLUCOSE, CAPILLARY
Glucose-Capillary: 120 mg/dL — ABNORMAL HIGH (ref 70–99)
Glucose-Capillary: 140 mg/dL — ABNORMAL HIGH (ref 70–99)

## 2013-01-13 NOTE — Progress Notes (Signed)
PULMONARY  / CRITICAL CARE MEDICINE  Name: [name] MRN: [id number] DOB: 03-18-1929    ADMISSION DATE:  12/31/2012 CONSULTATION DATE:  12/31/2012  REFERRING MD :  EDP PRIMARY SERVICE:  PCCM  CHIEF COMPLAINT:  Abdominal pain/ SBO  BRIEF PATIENT DESCRIPTION: 79 y/o without significant medical problems who developed abdominal pain, nausea, and vomiting 3 days prior to presentation to [facility] ED.  Found to have high grade SBO with pneumatosis and portal venous gas. Exploratory Lap early am 01/01/13, found foreign body imbedded in Small Bowel ?Bezoar. Pt. with hypoxia with LLL airspace disease post op  SIGNIFICANT EVENTS / STUDIES:  3/31 - Presented with nausea, vomiting, abdominal pain 3/31 -  Abdominal CT >>> High grade distal SBO and evidence of bowel ischemia 4/01 - Exploratory Lap,  Placed on ARDS Protocol, a fib with RVR >> amiodarone added 4/03 - d/c ARDS protocol 01/04/13 - Weaning pressors.  In sinus rhythm.  Extubated 01/05/13 - improved uop but still only marginal per RN. Very deconditioned and sleepy but not confused. Off pressors and extubated. DC amio  ECHO - ef 65%, Gr1 diast dysfn and PASP 49 01/07/13: Patient transferred from [facility] to [facility] for possible CVVH with rising serum Cr.  01/08/13: HD started for ATN 2/2 shock  LINES / TUBES: NGT 3/31 >>>4/5 Foley 3/31 >>>4/10 ETT 4/1>>>01/04/13 L IJ CVC 4/2>>>4/8 Rt IJ HD cath 4/8>>>  CULTURES: 4/2>   Sputum - e coli pan S 4/2>  Blood ng 4/2>  Urine ng  ANTIBIOTICS: Zosyn 3/31 (abd coverage)>>>4/10  Subjective:  eating well, no resp complaints   VITAL SIGNS: Temp:  [98 F (36.7 C)-98.9 F (37.2 C)] 98.9 F (37.2 C) (04/13 0935) Pulse Rate:  [80-88] 82 (04/13 0935) Resp:  [16-20] 18 (04/13 0935) BP: (157-175)/(64-80) 157/64 mmHg (04/13 0935) SpO2:  [90 %-98 %] 94 % (04/13 0935) Weight:  [152 lb 12.5 oz (69.3 kg)] 152 lb 12.5 oz (69.3 kg) (04/12 2034) FIO2  Room Air  INTAKE / OUTPUT: Intake/Output     04/12 0701 - 04/13  0700 04/13 0701 - 04/14 0700   P.O. 600 240   I.V. (mL/kg) 36.5 (0.5) 50.7 (0.7)   Total Intake(mL/kg) 636.5 (9.2) 290.7 (4.2)   Urine (mL/kg/hr) 1 (0)    Stool     Total Output 1     Net +635.5 +290.7        Stool Occurrence 1 x     PHYSICAL EXAMINATION: General: No distress Neuro: Alert, normal strength HEENT: Rt IJ site clean Cardiovascular: s1s2 regular Lungs: no wheeze  Abdomen: mid-line wound site clean with staples in place, non tender Musculoskeletal: no edema Skin: no rashes  LABS:  Recent Labs Lab 01/07/13 0830  01/09/13 0415 01/10/13 0500 01/11/13 0419 01/12/13 0500 01/13/13 0625  HGB 12.0  < > 9.5* 10.1* 10.0*  --   --   WBC 12.2*  < > 9.7 9.6 10.2  --   --   PLT 166  < > 123* 108* 135*  --   --   NA 141  140  < > 144 140 137 136 134*  K 3.7  3.8  < > 3.0* 3.5 3.4* 3.2* 3.2*  CL 107  106  < > 106 104 101 98 97  CO2 14*  14*  < > 24 28 28 23 22   GLUCOSE 111*  111*  < > 95 148* 140* 128* 125*  BUN 95*  97*  < > 65* 42* 34* 50* 67*  CREATININE 4.50*  4.52*  < > 3.79* 3.27* 3.38* 4.64* 5.89*  CALCIUM 8.6  8.5  < > 8.2* 8.4 8.1* 8.2* 8.0*  PHOS 7.9*  < > 5.1* 4.3 4.1 5.7* 7.6*  ALBUMIN 1.2*  < > 2.8* 2.7* 2.4* 2.2* 2.0*  PROCALCITON 8.55  --   --   --   --   --   --   PROBNP 6067.0*  --   --   --   --   --   --   < > = values in this interval not displayed.  Recent Labs Lab 01/12/13 1151 01/12/13 1609 01/12/13 2123 01/13/13 0814 01/13/13 1131  GLUCAP 174* 166* 174* 120* 140*    Imaging: No results found.               ASSESSMENT / PLAN:  Acute hypoxic respiratory failure> resolved  post-op with atelectasis >> improved. Plan: Bronchial hygiene   Paroxysmal A fib after surgery >> transiently required amiodarone >> now in sinus rhythm. Grade 1 diastolic dysfx on Echo 4/05. Hx of hyperlipidemia. Plan: Monitor heart rhythm, blood pressure Continue ASA, lipitor  Acute on chronic renal failure likely from ATN in setting of shock.   HD started 4/08. Plan: HD per renal  Distal SBO s/p laparotomy 4/1 with dx foreign body removal and peritonitis. Dysphagia. Plan:  Post-op care per [facility]CS on 4/14 F/u with Speech therapy > tol reg diet  Anemia of critical illness and chronic disease. Thrombocytopenia of critical illness. Hx of DVT with Factor V Leiden.    Lab Results  Component Value Date   HGB 10.0* 01/11/2013    Continue to monitor   Septic shock from aspiration pneumonia, and peritonitis >> resolved. Plan: Monitor off Abx  Hyperglycemia >> no hx of DM. Plan: SSI hgbA1c 4/12  >>>   Deconditioning. Plan: PT/OT Will need SNF placement   Sandrea Hughs, MD Pulmonary and Critical Care Medicine  Healthcare Cell [phone] After 5:30 PM or weekends, call [phone]

## 2013-01-13 NOTE — Progress Notes (Signed)
Subjective: Voided 1x yesterday  Objective Vital signs in last 24 hours: Filed Vitals:   01/12/13 1808 01/12/13 2034 01/13/13 0515 01/13/13 0935  BP: 171/80 162/66 166/75 157/64  Pulse: 80 88 88 82  Temp: 98 F (36.7 C) 98.4 F (36.9 C) 98.3 F (36.8 C) 98.9 F (37.2 C)  TempSrc: Oral Oral Oral Oral  Resp: 20 19 16 18   Height:      Weight:  69.3 kg (152 lb 12.5 oz)    SpO2: 98% 97% 90% 94%   Weight change: -0.9 kg (-1 lb 15.8 oz)  Intake/Output Summary (Last 24 hours) at 01/13/13 1301 Last data filed at 01/13/13 0803  Gross per 24 hour  Intake 447.17 ml  Output      0 ml  Net 447.17 ml   Labs: Basic Metabolic Panel:  Recent Labs Lab 01/10/13 0500 01/11/13 0419 01/12/13 0500 01/13/13 0625  NA 140 137 136 134*  K 3.5 3.4* 3.2* 3.2*  CL 104 101 98 97  CO2 28 28 23 22   GLUCOSE 148* 140* 128* 125*  BUN 42* 34* 50* 67*  CREATININE 3.27* 3.38* 4.64* 5.89*  CALCIUM 8.4 8.1* 8.2* 8.0*  PHOS 4.3 4.1 5.7* 7.6*   Liver Function Tests:  Recent Labs Lab 01/11/13 0419 01/12/13 0500 01/13/13 0625  ALBUMIN 2.4* 2.2* 2.0*   No results found for this basename: LIPASE, AMYLASE,  in the last 168 hours No results found for this basename: AMMONIA,  in the last 168 hours CBC:  Recent Labs Lab 01/08/13 0857 01/09/13 0415 01/10/13 0500 01/11/13 0419  WBC 9.0 9.7 9.6 10.2  HGB 10.6* 9.5* 10.1* 10.0*  HCT 30.4* 26.9* 29.4* 29.1*  MCV 83.7 82.3 84.7 84.6  PLT 129* 123* 108* 135*   PT/INR: @LABRCNTIP (inr:5)   Scheduled Meds ) . antiseptic oral rinse  15 mL Mouth Rinse BID  . aspirin EC  81 mg Oral Daily  . atorvastatin  10 mg Oral q1800  . ciprofloxacin  2 drop Both Eyes Q4H while awake  . feeding supplement  1 Container Oral TID BM  . heparin subcutaneous  5,000 Units Subcutaneous Q8H  . insulin aspart  0-5 Units Subcutaneous QHS  . sodium chloride  10-40 mL Intracatheter Q12H    Physical Exam:  Blood pressure 157/64, pulse 82, temperature 98.9 F (37.2 C),  temperature source Oral, resp. rate 18, height 5\' 3"  (1.6 m), weight 69.3 kg (152 lb 12.5 oz), SpO2 94.00%.  Gen: alert, no distress Neck: no JVD, no LAN  Chest: mostly clear CV: regular, no rub or gallop, pedal pulses intact  Abdomen: soft, moderately distended  Ext: 2+ edema legs, mild edema of arms bilat Neuro: oriented, very weak   3/31 CT Abd, noncontrast > air in SB, bilat basilar consolidation L > R  4/8 CXR > Left > R bilat basilar infiltrates, slightly better than 4/5 film 4/2 Renal US >> normal kidneys, no hydro  4/5 ECHO > normal LV, normal study  Creatinine 2.41 (3/31) >>> 4.50 (4/7)    Impression/Plan  1. Acute kidney injury- s/p HD x 3. Creat was normal three mos ago. Suspected ATN from shock at time of admission. Still has extra volume but cxr clear now. Creatinine rising w/o HD over weekend. Plan HD in am, use tight heparin. Still optimistic about recovery 2. Pulm edema- resolved by cxr 4/10.  3. AMS- resolved 4. SBO s/p exlap and removal of bezoar in jejenum 5. HTN- d/c'd norvasc, let BP run high side  Heather Moselle  MD [phone] pgr    [phone] cell 01/13/2013, 1:01 PM

## 2013-01-14 LAB — GLUCOSE, CAPILLARY
Glucose-Capillary: 118 mg/dL — ABNORMAL HIGH (ref 70–99)
Glucose-Capillary: 136 mg/dL — ABNORMAL HIGH (ref 70–99)
Glucose-Capillary: 126 mg/dL — ABNORMAL HIGH (ref 70–99)

## 2013-01-14 MED ORDER — LIDOCAINE-PRILOCAINE 2.5-2.5 % EX CREA: 1.0000 "application " | TOPICAL_CREAM | CUTANEOUS | Status: DC | PRN

## 2013-01-14 MED ORDER — SODIUM CHLORIDE 0.9 % IV SOLN: 100.0000 mL | INTRAVENOUS | Status: DC | PRN

## 2013-01-14 MED ORDER — NEPRO/CARBSTEADY PO LIQD: 237.0000 mL | ORAL | Status: DC | PRN

## 2013-01-14 MED ORDER — PENTAFLUOROPROP-TETRAFLUOROETH EX AERO: 1.0000 "application " | INHALATION_SPRAY | CUTANEOUS | Status: DC | PRN

## 2013-01-14 MED ORDER — LIDOCAINE HCL (PF) 1 % IJ SOLN: 5.0000 mL | INTRAMUSCULAR | Status: DC | PRN

## 2013-01-14 MED ORDER — HEPARIN SODIUM (PORCINE) 1000 UNIT/ML DIALYSIS: 2000.0000 [IU] | Freq: Once | INTRAMUSCULAR | Status: DC

## 2013-01-14 MED ORDER — ALTEPLASE 2 MG IJ SOLR
2.0000 mg | Freq: Once | INTRAMUSCULAR | Status: DC | PRN
  Filled 2013-01-14: qty 2

## 2013-01-14 MED ORDER — HEPARIN SODIUM (PORCINE) 1000 UNIT/ML DIALYSIS: 1000.0000 [IU] | INTRAMUSCULAR | Status: DC | PRN

## 2013-01-14 NOTE — Progress Notes (Signed)
Pt seen and examined  Soft, nt, nd. Incision c/d/i  Plan per PA Signing off= call with ?  Mary Sella. Andrey Campanile, MD, FACS General, Bariatric, & Minimally Invasive Surgery [facility], [place]

## 2013-01-14 NOTE — Progress Notes (Signed)
Impression/Plan  1. Acute kidney injury- s/p HD x 3. Creat was normal three mos ago. Suspected ATN from shock at time of admission. Still has extra volume but cxr clear now. May take a while for recovery due to aged kidneys. Creatinine rising w/o HD over weekend. Plan HD today, use tight heparin. Still optimistic about recovery 2. Pulm edema- resolved by cxr 4/10.  3. AMS- resolved 4. SBO s/p exlap and removal of bezoar in jejenum 5. HTN- d/c'd norvasc, avoid hypotension   Subjective: Interval History: c/o poor appetite  Objective: Vital signs in last 24 hours: Temp:  [97.6 F (36.4 C)-99 F (37.2 C)] 97.6 F (36.4 C) (04/14 0510) Pulse Rate:  [82-85] 82 (04/14 0510) Resp:  [16-18] 18 (04/14 0510) BP: (161-177)/(61-83) 161/61 mmHg (04/14 0510) SpO2:  [91 %-94 %] 91 % (04/14 0510) Weight:  [69.3 kg (152 lb 12.5 oz)] 69.3 kg (152 lb 12.5 oz) (04/13 2041) Weight change: 0 kg (0 lb)  Intake/Output from previous day: 04/13 0701 - 04/14 0700 In: 1129.1 [P.O.:720; I.V.:409.1] Out: -  Intake/Output this shift:    General appearance: alert, cooperative and appears stated age Head: Normocephalic, without obvious abnormality, atraumatic Resp: clear to auscultation bilaterally Chest wall: no tenderness GI: soft, non-tender; bowel sounds normal; no masses,  no organomegaly Extremities: edema 2+ BILATERAL  Lab Results: No results found for this basename: WBC, HGB, HCT, PLT,  in the last 72 hours BMET:  Recent Labs  01/12/13 0500 01/13/13 0625  NA 136 134*  K 3.2* 3.2*  CL 98 97  CO2 23 22  GLUCOSE 128* 125*  BUN 50* 67*  CREATININE 4.64* 5.89*  CALCIUM 8.2* 8.0*   No results found for this basename: PTH,  in the last 72 hours Iron Studies: No results found for this basename: IRON, TIBC, TRANSFERRIN, FERRITIN,  in the last 72 hours Studies/Results: No results found.  Scheduled: . antiseptic oral rinse  15 mL Mouth Rinse BID  . aspirin EC  81 mg Oral Daily  .  atorvastatin  10 mg Oral q1800  . ciprofloxacin  2 drop Both Eyes Q4H while awake  . feeding supplement  1 Container Oral TID BM  . heparin subcutaneous  5,000 Units Subcutaneous Q8H  . insulin aspart  0-5 Units Subcutaneous QHS  . sodium chloride  10-40 mL Intracatheter Q12H    LOS: 14 days   Shauntae Reitman C 01/14/2013,10:04 AM

## 2013-01-14 NOTE — Progress Notes (Signed)
PULMONARY  / CRITICAL CARE MEDICINE  Name: [name] MRN: [id number] DOB: [birth year]    ADMISSION DATE:  12/31/2012 CONSULTATION DATE:  12/31/2012  REFERRING MD :  EDP PRIMARY SERVICE:  PCCM  CHIEF COMPLAINT:  Abdominal pain/ SBO  BRIEF PATIENT DESCRIPTION: 79 y/o without significant medical problems who developed abdominal pain, nausea, and vomiting 3 days prior to presentation to [facility] ED.  Found to have high grade SBO with pneumatosis and portal venous gas. Exploratory Lap early am 01/01/13, found foreign body imbedded in Small Bowel ?Bezoar. Pt. with hypoxia with LLL airspace disease post op  SIGNIFICANT EVENTS / STUDIES:  3/31 - Presented with nausea, vomiting, abdominal pain 3/31 -  Abdominal CT >>> High grade distal SBO and evidence of bowel ischemia 4/01 - Exploratory Lap,  Placed on ARDS Protocol, a fib with RVR >> amiodarone added 4/03 - d/c ARDS protocol 01/04/13 - Weaning pressors.  In sinus rhythm.  Extubated 01/05/13 - improved uop but still only marginal per RN. Very deconditioned and sleepy but not confused. Off pressors and extubated. DC amio  ECHO - ef 65%, Gr1 diast dysfn and PASP 49 01/07/13: Patient transferred from [facility] to [facility] for possible CVVH with rising serum Cr.  01/08/13: HD started for ATN 2/2 shock  LINES / TUBES: NGT 3/31 >>>4/5 Foley 3/31 >>>4/10 ETT 4/1>>>01/04/13 L IJ CVC 4/2>>>4/8 Rt IJ HD cath 4/8>>>  CULTURES: 4/2>   Sputum - e coli pan S 4/2>  Blood ng 4/2>  Urine ng  ANTIBIOTICS: Zosyn 3/31 (abd coverage)>>>4/10  Subjective:  eating well, no resp complaints.  Feels very weak.    VITAL SIGNS: Temp:  [97.6 F (36.4 C)-99 F (37.2 C)] 97.6 F (36.4 C) (04/14 0510) Pulse Rate:  [82-85] 82 (04/14 0510) Resp:  [16-18] 18 (04/14 0510) BP: (161-177)/(61-83) 161/61 mmHg (04/14 0510) SpO2:  [91 %-94 %] 91 % (04/14 0510) Weight:  [152 lb 12.5 oz (69.3 kg)] 152 lb 12.5 oz (69.3 kg) (04/13 2041) FIO2  Room Air  INTAKE / OUTPUT: Intake/Output   04/13 0701 - 04/14 0700 04/14 0701 - 04/15 0700   P.O. 720    I.V. (mL/kg) 409.1 (5.9)    Total Intake(mL/kg) 1129.1 (16.3)    Urine (mL/kg/hr)     Total Output       Net +1129.1          Stool Occurrence 2 x     PHYSICAL EXAMINATION: General: No distress Neuro: Alert, appropriate, normal strength HEENT: mm moist, Rt IJ site clean Cardiovascular: s1s2 regular Lungs: resps even non labored on RA, no wheeze  Abdomen: mid-line wound site clean, non tender  Musculoskeletal: no edema Skin: no rashes  LABS:  Recent Labs Lab 01/09/13 0415 01/10/13 0500 01/11/13 0419 01/12/13 0500 01/13/13 0625  HGB 9.5* 10.1* 10.0*  --   --   WBC 9.7 9.6 10.2  --   --   PLT 123* 108* 135*  --   --   NA 144 140 137 136 134*  K 3.0* 3.5 3.4* 3.2* 3.2*  CL 106 104 101 98 97  CO2 24 28 28 23 22   GLUCOSE 95 148* 140* 128* 125*  BUN 65* 42* 34* 50* 67*  CREATININE 3.79* 3.27* 3.38* 4.64* 5.89*  CALCIUM 8.2* 8.4 8.1* 8.2* 8.0*  PHOS 5.1* 4.3 4.1 5.7* 7.6*  ALBUMIN 2.8* 2.7* 2.4* 2.2* 2.0*    Recent Labs Lab 01/12/13 2123 01/13/13 0814 01/13/13 1131 01/13/13 1635 01/14/13 0756  GLUCAP 174* 120* 140*  162* 118*    Imaging: No results found.               ASSESSMENT / PLAN:  Acute hypoxic respiratory failure> resolved  post-op with atelectasis >> improved. Plan: Bronchial hygiene   Paroxysmal A fib after surgery >> transiently required amiodarone >> now in sinus rhythm. Grade 1 diastolic dysfx on Echo 4/05. Hx of hyperlipidemia. HTN Plan: Monitor heart rhythm, blood pressure Continue ASA, lipitor  Acute on chronic renal failure likely from ATN in setting of shock.  HD started 4/08. Plan: Cont HD per renal - optimistic for recovery but may take a while per renal   Distal SBO s/p laparotomy 4/1 with dx foreign body removal and peritonitis - pathology showed phytobezoar Dysphagia. Plan:  Post-op care per [facility]  Cont regular diet   Anemia of critical illness and chronic  disease. Thrombocytopenia of critical illness. Hx of DVT with Factor V Leiden.   PLAN -  monitor   Septic shock from aspiration pneumonia, and peritonitis >> resolved. Plan: Monitor off Abx  Hyperglycemia >> no hx of DM. Plan: SSI hgbA1c 4/12  >>>6.2   Deconditioning. Plan: PT/OT Will need SNF placement   [facility], NP 01/14/2013  10:35 AM Pager: [phone] or [phone]  *Care during the described time interval was provided by me and/or other providers on the critical care team. I have reviewed this patient's available data, including medical history, events of note, physical examination and test results as part of my evaluation.  Levy Pupa, MD, PhD 01/14/2013, 10:52 AM [facility] [phone] or if no answer [phone]

## 2013-01-14 NOTE — Progress Notes (Signed)
13 Days Post-Op  Subjective: "I feel fine."  Eating and normal bowel function.  Says she's still very weak and has trouble standing.   Objective: Vital signs in last 24 hours: Temp:  [97.6 F (36.4 C)-99 F (37.2 C)] 97.6 F (36.4 C) (04/14 0510) Pulse Rate:  [82-85] 82 (04/14 0510) Resp:  [16-18] 18 (04/14 0510) BP: (157-177)/(61-83) 161/61 mmHg (04/14 0510) SpO2:  [91 %-94 %] 91 % (04/14 0510) Weight:  [152 lb 12.5 oz (69.3 kg)] 152 lb 12.5 oz (69.3 kg) (04/13 2041) Last BM Date: 01/13/13 720 PO recorded, 2BMs yesterday, TM99, Creatinine is 5.89 yesterday  Intake/Output from previous day: 04/13 0701 - 04/14 0700 In: 1129.1 [P.O.:720; I.V.:409.1] Out: -  Intake/Output this shift:    General appearance: alert, cooperative and no distress Resp: clear to auscultation bilaterally GI: soft, non-tender; bowel sounds normal; no masses,  no organomegaly and incisions look fine, steri strips in place.  Lab Results:  No results found for this basename: WBC, HGB, HCT, PLT,  in the last 72 hours  BMET  Recent Labs  01/12/13 0500 01/13/13 0625  NA 136 134*  K 3.2* 3.2*  CL 98 97  CO2 23 22  GLUCOSE 128* 125*  BUN 50* 67*  CREATININE 4.64* 5.89*  CALCIUM 8.2* 8.0*   PT/INR No results found for this basename: LABPROT, INR,  in the last 72 hours   Recent Labs Lab 01/09/13 0415 01/10/13 0500 01/11/13 0419 01/12/13 0500 01/13/13 0625  ALBUMIN 2.8* 2.7* 2.4* 2.2* 2.0*     Lipase     Component Value Date/Time   LIPASE 162* 12/31/2012 1940     Studies/Results: No results found.  Medications: . antiseptic oral rinse  15 mL Mouth Rinse BID  . aspirin EC  81 mg Oral Daily  . atorvastatin  10 mg Oral q1800  . ciprofloxacin  2 drop Both Eyes Q4H while awake  . feeding supplement  1 Container Oral TID BM  . heparin subcutaneous  5,000 Units Subcutaneous Q8H  . insulin aspart  0-5 Units Subcutaneous QHS  . sodium chloride  10-40 mL Intracatheter Q12H     Assessment/Plan 1. S/p ex lap with enterotomy for bezoar extraction and repair, POD 13  EXPLORATORY LAPAROTOMY removal of smal bowel foreign body; Pathology showed phytobezoar 01/01/13. Dr. Biagio Quint  Shock  Acute respiratory failure/Extubated with FM/Hypoxia improving  Acute renal failure; currently having HD  Leukocytosis  HTN (hypertension) BP is stable  On Amiodarone drip, looks like sinus on monitor currently  Hyponatremia  Dehydration  Acute renal failure  Hyperglycemia  Hx of DVT  Factor V leiden  Dysphonia   Plan:  From a surgical standpoint she is doing very well.  We will see her in hospital again as needed.  I have placed post op follow up instructions in the AVS.  From our standpoint she can shower, but with her HD access that may not be a good idea and will defer to primary. Steri strips can come off with bathing or 1 week.  Call if you need [place].   LOS: 14 days    Karolynn Infantino 01/14/2013

## 2013-01-14 NOTE — Procedures (Signed)
Hemodynamically stable. Tolerating treatment BP generous.  Catheter working well. Trea Latner C

## 2013-01-14 NOTE — Progress Notes (Signed)
Utilization review completed.  

## 2013-01-14 NOTE — Progress Notes (Signed)
Physical Therapy Treatment Patient Details Name: [name] MRN: [id number] DOB: Feb 08, 1929 Today's Date: 01/14/2013 Time: 8295-6213 PT Time Calculation (min): 27 min  PT Assessment / Plan / Recommendation Comments on Treatment Session  Pt making gradual improvement. She continues to need frequent rest periods, but is better able to stand, balance and begin functional movement to chair    Follow Up Recommendations  SNF     Does the patient have the potential to tolerate intense rehabilitation     Barriers to Discharge        Equipment Recommendations  Rolling walker with 5" wheels    Recommendations for Other Services OT consult  Frequency     Plan Discharge plan remains appropriate;Frequency remains appropriate    Precautions / Restrictions Precautions Precautions: Fall Restrictions Weight Bearing Restrictions: No   Pertinent Vitals/Pain No c/o pain    Mobility  Bed Mobility Bed Mobility: Supine to Sit;Sitting - Scoot to Edge of Bed Supine to Sit: 3: Mod assist;HOB flat;With rails Supine to Sit: Patient Percentage: 50% Sitting - Scoot to Edge of Bed: 3: Mod assist Details for Bed Mobility Assistance: VC's / tactile cues for sequencing  movements of LEs and trunk. Manual facilitation to raise trunk from bed secondary to weaknesss.  Assist to scoot hips to EOB using lift pad.   Transfers Transfers: Sit to Stand;Stand to [employer] Transfers Sit to Stand: From bed;With upper extremity assist;With armrests;1: +2 Total assist Sit to Stand: Patient Percentage: 60% Stand to Sit: To bed;To chair/3-in-1;Without upper extremity assist;With upper extremity assist;1: +2 Total assist Stand to Sit: Patient Percentage: 60% Stand Pivot Transfers: 1: +2 Total assist Stand Pivot Transfers: Patient Percentage: 50% Details for Transfer Assistance: Performed sit <--> stand to/from bed  x 4 trials with assist tio initiate standing and shift wt forward over her BOS. Vcs for hand  placement and to push through bilateral LEs.  Assist to control descent to chair.  Pt performed 4 stand <-->sit tranfers with asisit to conrtoll decent pt to the bed or chailr.    Pt also able to take several small steps to get around to in from of chair.  She is able to perfrom sit to stand from chair so should be able to get back to bed with nursing. Ambulation/Gait Ambulation/Gait Assistance: Not tested (comment);1: +2 Total assist Ambulation/Gait: Patient Percentage: 50% Ambulation Distance (Feet): 2 Feet Assistive device: Rolling walker Ambulation/Gait Assistance Details: pt assisted with stabiliy at pelvis to help control weight shift and stepping to get to chair Gait Pattern: Decreased step length - left;Decreased stance time - right;Step-to pattern Gait velocity: decreased General Gait Details: Pt with limited distance. Pt c/o fatigue after activity Stairs: No Wheelchair Mobility Wheelchair Mobility: No    Exercises General Exercises - Lower Extremity Ankle Circles/Pumps: AROM;Both;10 reps;Supine Quad Sets: AROM;Both;5 reps;Supine Gluteal Sets: AROM;Both;5 reps;Standing Hip ABduction/ADduction: Both;5 reps;AAROM;Supine Hip Flexion/Marching: AAROM;Both;5 reps;Supine   PT Diagnosis:    PT Problem List:   PT Treatment Interventions:     PT Goals    Visit Information  Last PT Received On: 01/14/13    Subjective Data  Subjective: I can try Patient Stated Goal: to back to normal   Cognition  Cognition Overall Cognitive Status: Appears within functional limits for tasks assessed/performed Arousal/Alertness: Awake/alert Orientation Level: Appears intact for tasks assessed;Oriented X4 / Intact Behavior During Session: [facility] for tasks performed    Balance  Balance Balance Assessed: Yes Static Sitting Balance Static Sitting - Balance Support: Feet supported Static  Sitting - Level of Assistance: 6: Modified independent (Device/Increase time) Static Sitting - Comment/# of  Minutes: pt able to maintaing good posture sitting on EOB Static Standing Balance Static Standing - Balance Support: Bilateral upper extremity supported Static Standing - Level of Assistance: 3: Mod assist Static Standing - Comment/# of Minutes: 10 seconds x 3 trials  End of Session PT - End of Session Equipment Utilized During Treatment: Gait belt Activity Tolerance: Patient tolerated treatment well (pt needs frequent rest periods) Patient left: in chair;with family/visitor present   GP    Rosey Bath K. [place], [place] [phone] 01/14/2013, 1:04 PM

## 2013-01-15 LAB — GLUCOSE, CAPILLARY
Glucose-Capillary: 137 mg/dL — ABNORMAL HIGH (ref 70–99)
Glucose-Capillary: 146 mg/dL — ABNORMAL HIGH (ref 70–99)

## 2013-01-15 LAB — RENAL FUNCTION PANEL
Albumin: 2 g/dL — ABNORMAL LOW (ref 3.5–5.2)
BUN: 42 mg/dL — ABNORMAL HIGH (ref 6–23)
GFR calc non Af Amer: 9 mL/min — ABNORMAL LOW (ref >90)
Phosphorus: 6 mg/dL — ABNORMAL HIGH (ref 2.3–4.6)
Potassium: 3.5 mEq/L (ref 3.5–5.1)
Sodium: 135 mEq/L (ref 135–145)

## 2013-01-15 LAB — CBC
MCV: 81.7 fL (ref 78.0–100.0)
Platelets: 125 10*3/uL — ABNORMAL LOW (ref 150–400)
RDW: 14.3 % (ref 11.5–15.5)
WBC: 5.9 10*3/uL (ref 4.0–10.5)

## 2013-01-15 LAB — HEPATITIS B SURFACE ANTIBODY,QUALITATIVE: Hep B S Ab: NONREACTIVE

## 2013-01-15 MED ORDER — WHITE PETROLATUM GEL
Status: AC
  Administered 2013-01-15: 0.2
  Filled 2013-01-15: qty 5

## 2013-01-15 NOTE — Progress Notes (Signed)
Urinary retention; Patient complaining of pressure in the lower abdomen; bladder scan with 990cc; she is unable to urinate;   Plan: insert Foley catheter. Strict I/O monitoring.

## 2013-01-15 NOTE — Progress Notes (Signed)
Patient c/o pressure on her bladder,feeling of having to urinate but unable to.Bladder scan revealed 992 cc.Dr. Frederico Hamman notified,order rec'd.to place foley.Will continue to monitor. Cleona Doubleday Joselita,RN

## 2013-01-15 NOTE — Progress Notes (Signed)
  Impression/Plan  1. Acute kidney injury- s/p HD x 3. Had urinary retention requiring foley catheter and over 900cc of urine removed!                              Hemodialysis yesterday; will hold with increased uop  Plan: Will see if relief of obstruction has helped recovery of RF  Subjective: Interval History: Urinary retention  Objective: Vital signs in last 24 hours: Temp:  [97 F (36.1 C)-98.6 F (37 C)] 98.6 F (37 C) (04/15 1000) Pulse Rate:  [82-94] 86 (04/15 1000) Resp:  [18] 18 (04/15 1000) BP: (126-179)/(53-114) 140/53 mmHg (04/15 1000) SpO2:  [95 %-100 %] 95 % (04/15 1000) Weight:  [67.297 kg (148 lb 5.8 oz)-69.9 kg (154 lb 1.6 oz)] 67.297 kg (148 lb 5.8 oz) (04/14 2004) Weight change: 0.6 kg (1 lb 5.2 oz)  Intake/Output from previous day: 04/14 0701 - 04/15 0700 In: 1840 [P.O.:940; I.V.:900] Out: 3975 [Urine:975] Intake/Output this shift: Total I/O In: 240 [P.O.:240] Out: -    General appearance: alert and cooperative Resp: clear to auscultation bilaterally Chest wall: no tenderness Cardio: regular rate and rhythm, S1, S2 normal, no murmur, click, rub or gallop GI: soft, non-tender; bowel sounds normal; no masses,  no organomegaly Extremities: edema trace  Lab Results:  Recent Labs  01/15/13 0500  WBC 5.9  HGB 8.4*  HCT 23.6*  PLT 125*   BMET:  Recent Labs  01/13/13 0625 01/15/13 0500  NA 134* 135  K 3.2* 3.5  CL 97 99  CO2 22 24  GLUCOSE 125* 125*  BUN 67* 42*  CREATININE 5.89* 4.27*  CALCIUM 8.0* 7.7*   No results found for this basename: PTH,  in the last 72 hours Iron Studies: No results found for this basename: IRON, TIBC, TRANSFERRIN, FERRITIN,  in the last 72 hours Studies/Results: No results found.   LOS: 15 days   [name] C 01/15/2013,11:34 AM

## 2013-01-15 NOTE — Clinical Social Work Placement (Addendum)
Clinical Social Work Department CLINICAL SOCIAL WORK PLACEMENT NOTE 01/15/2013  Patient:  [name], [name]  Account Number:  [id number] Admit date:  12/31/2012  Clinical Social Worker:  Genelle Bal, LCSW  Date/time:  01/15/2013 05:46 AM  Clinical Social Work is seeking post-discharge placement for this patient at the following level of care:   SKILLED NURSING   (*CSW will update this form in Epic as items are completed)   01/14/2013  Patient/family provided with [facility] Department of Clinical Social Work's list of facilities offering this level of care within the geographic area requested by the patient (or if unable, by the patient's family).  01/14/2013  Patient/family informed of their freedom to choose among providers that offer the needed level of care, that participate in Medicare, Medicaid or managed care program needed by the patient, have an available bed and are willing to accept the patient.    Patient/family informed of [facility]' ownership interest in [facility], as well as of the fact that they are under no obligation to receive care at this facility.  PASARR submitted to EDS on 01/11/2013 PASARR number received from EDS on 01/11/2013  FL2 transmitted to all facilities in geographic area requested by pt/family on  01/15/2013 FL2 transmitted to all facilities within larger geographic area on   Patient informed that his/her managed care company has contracts with or will negotiate with  certain facilities, including the following:     Patient/family informed of bed offers received: 01/18/13   Patient chooses bed at [facility] Physician recommends and patient chooses bed at    Patient to be transferred to  on  01/28/2013 Patient to be transferred to facility by EMS  The following physician request were entered in Epic:   Additional Comments: 01/18/13 - Patient advised that [facility] made bed offer. Call made to [facility] in admissions at [facility] and she anticipates having a semi-private room available early next week. Call made to patient's daughter [name] and message left.

## 2013-01-15 NOTE — Progress Notes (Signed)
PULMONARY  / CRITICAL CARE MEDICINE  Name: [name] MRN: [id number] DOB: Oct 17, 1928    ADMISSION DATE:  12/31/2012 CONSULTATION DATE:  12/31/2012  REFERRING MD :  EDP PRIMARY SERVICE:  PCCM  CHIEF COMPLAINT:  Abdominal pain/ SBO  BRIEF PATIENT DESCRIPTION: 79 y/o without significant medical problems who developed abdominal pain, nausea, and vomiting 3 days prior to presentation to [facility] ED.  Found to have high grade SBO with pneumatosis and portal venous gas. Exploratory Lap early am 01/01/13, found foreign body imbedded in Small Bowel ?Bezoar. Pt. with hypoxia with LLL airspace disease post op  SIGNIFICANT EVENTS / STUDIES:  3/31 - Presented with nausea, vomiting, abdominal pain 3/31 -  Abdominal CT >>> High grade distal SBO and evidence of bowel ischemia 4/01 - Exploratory Lap,  Placed on ARDS Protocol, a fib with RVR >> amiodarone added 4/03 - d/c ARDS protocol 01/04/13 - Weaning pressors.  In sinus rhythm.  Extubated 01/05/13 - improved uop but still only marginal per RN. Very deconditioned and sleepy but not confused. Off pressors and extubated. DC amio  ECHO - ef 65%, Gr1 diast dysfn and PASP 49 01/07/13: Patient transferred from [facility] to [facility] for possible CVVH with rising serum Cr.  01/08/13: HD started for ATN 2/2 shock  LINES / TUBES: NGT 3/31 >>>4/5 Foley 3/31 >>>4/10 ETT 4/1>>>01/04/13 L IJ CVC 4/2>>>4/8 Rt IJ HD cath 4/8>>>  CULTURES: 4/2>   Sputum - e coli pan S 4/2>  Blood ng 4/2>  Urine ng  ANTIBIOTICS: Zosyn 3/31 (abd coverage)>>>4/10  Subjective:  eating well, no resp complaints.  Feels very weak.   Note increased UOP - 900cc after foley placed  VITAL SIGNS: Temp:  [97 F (36.1 C)-98.6 F (37 C)] 98.6 F (37 C) (04/15 1000) Pulse Rate:  [82-94] 86 (04/15 1000) Resp:  [18] 18 (04/15 1000) BP: (126-179)/(53-114) 140/53 mmHg (04/15 1000) SpO2:  [95 %-100 %] 95 % (04/15 1000) Weight:  [67.297 kg (148 lb 5.8 oz)-69.9 kg (154 lb 1.6 oz)] 67.297 kg (148 lb 5.8 oz)  (04/14 2004) FIO2  Room Air  INTAKE / OUTPUT: Intake/Output     04/14 0701 - 04/15 0700 04/15 0701 - 04/16 0700   P.O. 940 240   I.V. (mL/kg) 900 (13.4)    Total Intake(mL/kg) 1840 (27.3) 240 (3.6)   Urine (mL/kg/hr) 975 (0.6)    Other 3000 (1.9)    Total Output 3975     Net -2135 +240        Urine Occurrence 1 x    Stool Occurrence 1 x     PHYSICAL EXAMINATION: General: No distress, sitting in chair Neuro: Alert, appropriate, normal strength HEENT: mm moist, Rt IJ site clean Cardiovascular: s1s2 regular Lungs: resps even non labored on RA, no wheeze  Abdomen: mid-line wound site clean, non tender  Musculoskeletal: no edema Skin: no rashes  LABS:  Recent Labs Lab 01/10/13 0500 01/11/13 0419 01/12/13 0500 01/13/13 0625 01/15/13 0500  HGB 10.1* 10.0*  --   --  8.4*  WBC 9.6 10.2  --   --  5.9  PLT 108* 135*  --   --  125*  NA 140 137 136 134* 135  K 3.5 3.4* 3.2* 3.2* 3.5  CL 104 101 98 97 99  CO2 28 28 23 22 24   GLUCOSE 148* 140* 128* 125* 125*  BUN 42* 34* 50* 67* 42*  CREATININE 3.27* 3.38* 4.64* 5.89* 4.27*  CALCIUM 8.4 8.1* 8.2* 8.0* 7.7*  PHOS 4.3  4.1 5.7* 7.6* 6.0*  ALBUMIN 2.7* 2.4* 2.2* 2.0* 2.0*    Recent Labs Lab 01/13/13 1635 01/14/13 0756 01/14/13 1121 01/14/13 2008 01/15/13 0740  GLUCAP 162* 118* 136* 126* 139*    Imaging: No results found.               ASSESSMENT / PLAN:  Acute hypoxic respiratory failure> resolved  post-op with atelectasis >> improved. Plan: Bronchial hygiene   Paroxysmal A fib after surgery >> transiently required amiodarone >> now in sinus rhythm. Grade 1 diastolic dysfx on Echo 4/05. Hx of hyperlipidemia. HTN Plan: Monitor heart rhythm, blood pressure Continue ASA, lipitor  Acute on chronic renal failure likely from ATN in setting of shock.  HD started 4/08. UOP picking up last 24h 4/15 Plan: Cont HD per renal - optimistic for recovery, especially with the increase in UOP last 24h. Renal planning to  hold HD to see how UOP and S Cr progress.   Distal SBO s/p laparotomy 4/1 with dx foreign body removal and peritonitis - pathology showed phytobezoar Dysphagia. Plan:  Post-op care per CCS  Cont regular diet   Anemia of critical illness and chronic disease. Thrombocytopenia of critical illness. Hx of DVT with Factor V Leiden.   PLAN -  monitor   Septic shock from aspiration pneumonia, and peritonitis >> resolved. Plan: Monitor off Abx  Hyperglycemia >> no hx of DM. Plan: SSI hgbA1c 4/12  >>>6.2   Deconditioning. Plan: PT/OT Will need SNF placement, await placement   [facility] Minor ACNP Adolph Pollack PCCM Pager [phone] till 3 pm If no answer page [phone] 01/15/2013, 11:27 AM  Levy Pupa, MD, PhD 01/15/2013, 3:27 PM [facility] [phone] or if no answer [phone]

## 2013-01-15 NOTE — Progress Notes (Signed)
Pigtail Iv access of trauma cath/ flushed w/ 10ml NS. Per protocol.

## 2013-01-15 NOTE — Clinical Social Work Note (Signed)
On 01/14/13 CSW talked with patient and daughter [name] (from [place]) to continue the conversation about a skilled nursing facility for short-term rehab. Patient and daughter talked with CSW [place] on 4/7 and were in agreement with [facility] re[facility] and this remains the discharge plan. Per daughter, their preference is [facility]. CSW, daughter and patient also talked about an Assisted Living Facility as a long-term option after short-term rehab and [name] was provided with an ALF list and applying for Medicaid was also discussed. Patient reported that she has a long-term care policy and CSW applauded patient in being proactive in choosing to have this coverage.  Daughter advised that she will be leaving soon however she can be contacted by phone as can her brother. CSW advised daughter that she will be kept informed of progress of facility search.  Genelle Bal, MSW, LCSW [phone]

## 2013-01-15 NOTE — Progress Notes (Signed)
Pt with complaints of lower pain. Repositioned in the bed with no relief. Page to Dr. Frederico Hamman with callback. N.O for tylenol. Dondra Spry

## 2013-01-16 DIAGNOSIS — I4891 Unspecified atrial fibrillation: Secondary | ICD-10-CM

## 2013-01-16 DIAGNOSIS — R5381 Other malaise: Secondary | ICD-10-CM

## 2013-01-16 DIAGNOSIS — N179 Acute kidney failure, unspecified: Secondary | ICD-10-CM

## 2013-01-16 DIAGNOSIS — J9600 Acute respiratory failure, unspecified whether with hypoxia or hypercapnia: Secondary | ICD-10-CM

## 2013-01-16 LAB — RENAL FUNCTION PANEL
CO2: 23 mEq/L (ref 19–32)
Calcium: 8 mg/dL — ABNORMAL LOW (ref 8.4–10.5)
GFR calc Af Amer: 8 mL/min — ABNORMAL LOW (ref >90)
Glucose, Bld: 132 mg/dL — ABNORMAL HIGH (ref 70–99)
Potassium: 3.9 mEq/L (ref 3.5–5.1)
Sodium: 134 mEq/L — ABNORMAL LOW (ref 135–145)

## 2013-01-16 LAB — GLUCOSE, CAPILLARY: Glucose-Capillary: 107 mg/dL — ABNORMAL HIGH (ref 70–99)

## 2013-01-16 MED ORDER — ACETAMINOPHEN 10 MG/ML IV SOLN
INTRAVENOUS | Status: AC
  Filled 2013-01-16: qty 100

## 2013-01-16 MED ORDER — ACETAMINOPHEN 325 MG PO TABS
650.0000 mg | ORAL_TABLET | ORAL | Status: DC | PRN
  Administered 2013-01-16 – 2013-01-28 (×11): 650 mg via ORAL
  Filled 2013-01-16 (×11): qty 2

## 2013-01-16 NOTE — Progress Notes (Signed)
Impression/Plan  Acute kidney injury-s/p SBO resection and ATN  s/p HD x 3. Had urinary retention requiring foley catheter.   Hemodialysis 2 days ago; will hold    Plan: Will see if relief of obstruction has helped recovery of RF.  Would consider foley removal in am.             Will hold on HD to see what happens as no current indication.    Subjective: Interval History: C/O food lousy   Objective: Vital signs in last 24 hours: Temp:  [98 F (36.7 C)-98.9 F (37.2 C)] 98.5 F (36.9 C) (04/16 0425) Pulse Rate:  [80-87] 84 (04/16 0425) Resp:  [18] 18 (04/16 0425) BP: (140-154)/(53-70) 150/70 mmHg (04/16 0425) SpO2:  [95 %-98 %] 95 % (04/16 0425) Weight:  [67.297 kg (148 lb 5.8 oz)] 67.297 kg (148 lb 5.8 oz) (04/15 2046) Weight change: -2.603 kg (-5 lb 11.8 oz)  Intake/Output from previous day: 04/15 0701 - 04/16 0700 In: 960 [P.O.:960] Out: 701 [Urine:700; Stool:1] Intake/Output this shift: Total I/O In: 120 [P.O.:120] Out: -   General appearance: alert and cooperative Resp: clear to auscultation bilaterally Chest wall: no tenderness Cardio: regular rate and rhythm, S1, S2 normal, no murmur, click, rub or gallop Extremities: edema 2+  Lab Results:  Recent Labs  01/15/13 0500  WBC 5.9  HGB 8.4*  HCT 23.6*  PLT 125*   BMET:  Recent Labs  01/15/13 0500 01/16/13 0600  NA 135 134*  K 3.5 3.9  CL 99 97  CO2 24 23  GLUCOSE 125* 132*  BUN 42* 56*  CREATININE 4.27* 5.43*  CALCIUM 7.7* 8.0*   No results found for this basename: PTH,  in the last 72 hours Iron Studies: No results found for this basename: IRON, TIBC, TRANSFERRIN, FERRITIN,  in the last 72 hours Studies/Results: No results found.  Scheduled: . antiseptic oral rinse  15 mL Mouth Rinse BID  . aspirin EC  81 mg Oral Daily  . atorvastatin  10 mg Oral q1800  . ciprofloxacin  2 drop Both Eyes Q4H while awake  . feeding supplement  1 Container Oral TID BM  . heparin subcutaneous  5,000 Units  Subcutaneous Q8H  . insulin aspart  0-5 Units Subcutaneous QHS  . sodium chloride  10-40 mL Intracatheter Q12H     LOS: 16 days   Okema Rollinson C 01/16/2013,9:26 AM

## 2013-01-16 NOTE — Progress Notes (Addendum)
PULMONARY  / CRITICAL CARE MEDICINE  Name: [name] MRN: [id number] DOB: [birth year]    ADMISSION DATE:  12/31/2012 CONSULTATION DATE:  12/31/2012  REFERRING MD :  EDP PRIMARY SERVICE:  PCCM  CHIEF COMPLAINT:  Abdominal pain/ SBO  BRIEF PATIENT DESCRIPTION: 79 y/o without significant medical problems who developed abdominal pain, nausea, and vomiting 3 days prior to presentation to [facility] ED.  Found to have high grade SBO with pneumatosis and portal venous gas. Exploratory Lap early am 01/01/13, found foreign body imbedded in Small Bowel ?Bezoar. Pt. with hypoxia with LLL airspace disease post op  SIGNIFICANT EVENTS / STUDIES:  3/31 - Presented with nausea, vomiting, abdominal pain 3/31 -  Abdominal CT >>> High grade distal SBO and evidence of bowel ischemia 4/01 - Exploratory Lap,  Placed on ARDS Protocol, a fib with RVR >> amiodarone added 4/03 - d/c ARDS protocol 4/04 - Weaning pressors.  In sinus rhythm.  Extubated 4/05 - improved uop but still only marginal per RN. Very deconditioned and sleepy but not confused. Off pressors and extubated. DC amio  ECHO - ef 65%, Gr1 diast dysfn and PASP 49 4/07 - Patient transferred from [facility] to [facility] for possible CVVH with rising serum Cr.  4/08 - HD started for ATN 2/2 shock 4/16 - progressing with PT, making some urine  LINES / TUBES: NGT 3/31 >>>4/5 Foley 3/31 >>>4/10 ETT 4/1>>>01/04/13 L IJ CVC 4/2>>>4/8 Rt IJ HD cath 4/8>>>  CULTURES: 4/2 Sputum>>> e coli pan S 4/2 Blood>>>ng  4/2 Urine >>>ng  ANTIBIOTICS: Zosyn 3/31 (abd coverage)>>>4/10  Subjective:  Very motivated, wants to work with PT more.  Hopeful her kidney function will return to normal.    VITAL SIGNS: Temp:  [98 F (36.7 C)-98.9 F (37.2 C)] 98.5 F (36.9 C) (04/16 0425) Pulse Rate:  [80-87] 84 (04/16 0425) Resp:  [18] 18 (04/16 0425) BP: (140-154)/(53-70) 150/70 mmHg (04/16 0425) SpO2:  [95 %-98 %] 95 % (04/16 0425) Weight:  [148 lb 5.8 oz (67.297 kg)] 148 lb 5.8  oz (67.297 kg) (04/15 2046) FIO2  Room Air  INTAKE / OUTPUT: Intake/Output     04/15 0701 - 04/16 0700 04/16 0701 - 04/17 0700   P.O. 960 120   I.V. (mL/kg)     Total Intake(mL/kg) 960 (14.3) 120 (1.8)   Urine (mL/kg/hr) 700 (0.4)    Other     Stool 1 (0)    Total Output 701     Net +259 +120        Stool Occurrence  1 x    PHYSICAL EXAMINATION: General: No distress, sitting up in bed Neuro: Alert, appropriate, normal strength HEENT: mm moist, Rt IJ site clean Cardiovascular: s1s2 regular Lungs: resps even non labored on RA, no wheeze  Abdomen: mid-line wound site clean, non tender  Musculoskeletal: no edema Skin: no rashes  LABS:  Recent Labs Lab 01/10/13 0500 01/11/13 0419  01/13/13 0625 01/15/13 0500 01/16/13 0600  HGB 10.1* 10.0*  --   --  8.4*  --   WBC 9.6 10.2  --   --  5.9  --   PLT 108* 135*  --   --  125*  --   NA 140 137  < > 134* 135 134*  K 3.5 3.4*  < > 3.2* 3.5 3.9  CL 104 101  < > 97 99 97  CO2 28 28  < > 22 24 23   GLUCOSE 148* 140*  < > 125* 125* 132*  BUN 42* 34*  < > 67* 42* 56*  CREATININE 3.27* 3.38*  < > 5.89* 4.27* 5.43*  CALCIUM 8.4 8.1*  < > 8.0* 7.7* 8.0*  PHOS 4.3 4.1  < > 7.6* 6.0* 7.5*  ALBUMIN 2.7* 2.4*  < > 2.0* 2.0* 1.9*  < > = values in this interval not displayed.  Recent Labs Lab 01/15/13 0740 01/15/13 1141 01/15/13 1638 01/15/13 2050 01/16/13 0743  GLUCAP 139* 137* 113* 146* 110*    Imaging: No results found.               ASSESSMENT / PLAN:  Acute hypoxic respiratory failure> resolved Post-op with atelectasis >> improved.  Plan: Bronchial hygiene   Paroxysmal A fib after surgery >> transiently required amiodarone >> now in sinus rhythm. Grade 1 diastolic dysfx on Echo 4/05. Hx of hyperlipidemia. HTN Plan: Monitor heart rhythm, blood pressure Continue ASA, lipitor  Acute on chronic renal failure likely from ATN in setting of shock.  HD started 4/08. UOP picking up last 24h 4/15 Plan: Holding  further HD for now, would restart if an urgent indication - optimistic for recovery, especially with the increase in UOP last 24h. Follow UOP and S Cr progress > S Cr his risen over last 24 h 4/15-16  Distal SBO s/p laparotomy 4/1 with dx foreign body removal and peritonitis - pathology showed phytobezoar Dysphagia. Plan: Post-op care per CCS  Cont regular diet   Anemia of critical illness and chronic disease. Thrombocytopenia of critical illness. Hx of DVT with Factor V Leiden. Plan: monitor   Septic shock from aspiration pneumonia, and peritonitis >> resolved. Plan: Monitor off Abx  Hyperglycemia >> no hx of DM. Plan: SSI hgbA1c 4/12  >>>6.2   Deconditioning. Plan: PT/OT Will likely need CIR vs SNF placement, await placement.  She is very motivated and wants to participate in care.  Has a niece and sister in law she could potentially stay with after discharge.   CIR eval Have asked PT to work with her and extra visit this week.   Canary Brim, NP-C [place] [facility] Pgr: [phone] or [phone]  Levy Pupa, MD, PhD 01/16/2013, 3:04 PM [name] [phone] or if no answer [phone]

## 2013-01-16 NOTE — Progress Notes (Signed)
Physical Therapy Treatment Patient Details Name: [name] MRN: [id number] DOB: Jul 15, 1929 Today's Date: 01/16/2013 Time:  -     PT Assessment / Plan / Recommendation Comments on Treatment Session  Pt making great progress with mobility & PT goals at this date.  Very pleasant & motivated to participate.  Required min (A) to ambulate ~25' with RW.       Follow Up Recommendations  SNF     Does the patient have the potential to tolerate intense rehabilitation     Barriers to Discharge        Equipment Recommendations  Rolling walker with 5" wheels    Recommendations for Other Services    Frequency Min 3X/week   Plan Discharge plan remains appropriate;Frequency remains appropriate    Precautions / Restrictions Precautions Precautions: Fall Restrictions Weight Bearing Restrictions: No       Mobility  Bed Mobility Bed Mobility: Supine to Sit;Sitting - Scoot to Edge of Bed Supine to Sit: 4: Min assist;HOB flat;With rails Sitting - Scoot to Edge of Bed: 4: Min assist Details for Bed Mobility Assistance: Cues for technique.  (A) to bring shoulders/trunk to sitting upright.   Transfers Transfers: Sit to Stand;Stand to Sit Sit to Stand: 4: Min assist;With upper extremity assist;From bed Stand to Sit: 4: Min assist;With upper extremity assist;With armrests;To chair/3-in-1 Details for Transfer Assistance: Cues for hand placement & technique.  (A) to achieve standing, balance, & controlled descent.  Pt with posterior lean with initial standing.   Ambulation/Gait Ambulation/Gait Assistance: 4: Min assist;Other (comment) (2nd person followed with recliner) Ambulation Distance (Feet): 25 Feet Assistive device: Rolling walker Ambulation/Gait Assistance Details: (A) for balance & safety.  Cues for RW advancement, use of UE's to provide stability.  Pt unsteady.   Gait Pattern: Step-through pattern;Decreased stride length;Wide base of support Gait velocity: decreased Stairs:  No Wheelchair Mobility Wheelchair Mobility: No    Exercises Total Joint Exercises Heel Slides: 10 reps;Both;Supine General Exercises - Lower Extremity Ankle Circles/Pumps: AROM;Both;20 reps Hip Flexion/Marching: AROM;Both;10 reps;Seated     PT Goals Acute Rehab PT Goals Time For Goal Achievement: 01/16/13 Potential to Achieve Goals: Fair Pt will go Supine/Side to Sit: with modified independence PT Goal: Supine/Side to Sit - Progress: Progressing toward goal Pt will go Sit to Supine/Side: with modified independence Pt will go Sit to Stand: with modified independence PT Goal: Sit to Stand - Progress: Progressing toward goal Pt will go Stand to Sit: with modified independence PT Goal: Stand to Sit - Progress: Progressing toward goal Pt will Ambulate: 16 - 50 feet;with rolling walker PT Goal: Ambulate - Progress: Progressing toward goal  Visit Information  Last PT Received On: 01/16/13 Assistance Needed: +1    Subjective Data  Subjective: "oh yay. Im glad yall are here" Patient Stated Goal: to back to normal   Cognition  Cognition Arousal/Alertness: Awake/alert Behavior During Therapy: WFL for tasks assessed/performed Overall Cognitive Status: Within Functional Limits for tasks assessed    Balance  Static Standing Balance Static Standing - Balance Support: Bilateral upper extremity supported Static Standing - Level of Assistance: 4: Min assist Static Standing - Comment/# of Minutes: (A) for stability.  Posterior lean.    End of Session PT - End of Session Equipment Utilized During Treatment: Gait belt Activity Tolerance: Patient tolerated treatment well Patient left: in chair;with call bell/phone within reach Nurse Communication: Mobility status     Verdell Face, [place] [phone] 01/16/2013

## 2013-01-16 NOTE — Progress Notes (Signed)
Mild back pain  Tylenol ordered

## 2013-01-16 NOTE — Consult Note (Signed)
Physical Medicine and Rehabilitation Consult  Reason for Consult: Deconditioned.  Referring Physician:  Dr. Marin Shutter   HPI: [name] is a 79 y.o. female with history of HTN, factor V Leiden (no anticoagulation), who was admitted on 12/31/12 with abdominal pain and nausea due to high grade SBO as well as pneumatosis of bowel with portal venous gas. She was taken to OR on the same day for  Exp Lap with removal of foreign body by Dr. Biagio Quint. Path showed phytobezoar. Post op course complicated by respiratory failure with hypotension as well as A Fib with RVR.Marland Kitchen She was extubated on 04/05 but developed fluid overload with pulmonary edema and bibasilar PNA. Treated with IV antibiotics. Acute renal failure like due to ATN from shock. She was treated with dialysis X 3 and pulmonary edema has resolved. She was noted to have poor UOP due to urinary retention requiring replacement of foley. Is monitored to see if renal function will improve off HD. Patient is deconditioned due to multiple medical issues but is showing improvement in balance and functional movements. MD and patient requesting CIR.  Patient states that she has been living independently. She has a sister-in-law as well as a daughter that may be able to help her post discharge with cooking housekeeping and shopping  Review of Systems  Eyes: Negative for blurred vision and double vision.  Respiratory: Negative for cough and shortness of breath.   Cardiovascular: Negative for chest pain and palpitations.  Gastrointestinal: Positive for abdominal pain. Negative for nausea and vomiting.  Genitourinary: Negative for dysuria.       Foley for retention  Musculoskeletal: Negative for myalgias, back pain and joint pain.  Neurological: Negative for dizziness, speech change and headaches.  Psychiatric/Behavioral: The patient has insomnia (due to hospitalizaion).    Past Medical History  Diagnosis Date  . Osteoporosis   . DVT (deep venous  thrombosis)   . Factor V Leiden   . Hypertension   . Dysphonia    Past Surgical History  Procedure Laterality Date  . Tonsillectomy    . Appendectomy    . Abdominal hysterectomy    . Laparotomy N/A 01/01/2013    Procedure: EXPLORATORY LAPAROTOMY removal of smal bowel foreign body;  Surgeon: Lodema Pilot, DO;  Location: [facility];  Service: General;  Laterality: N/A;   History reviewed. No pertinent family history.  Social History: Widowed. Retired Airline pilot. Lives alone in condo--[address] level. Independent and active--volunteers  at [facility] 1/week and goes to gym for treadmill/weights 3 X wk. Has family that can check in past discharge. She  reports that she has quit smoking ~14 years ago. She does not have any smokeless tobacco history on file. She reports that  drinks alcohol- a glass of wine once a month.  She reports that she does not use illicit drugs.    Allergies  Allergen Reactions  . Diovan (Valsartan)   . Hctz (Hydrochlorothiazide)   . Tekamlo (Aliskiren-Amlodipine)   . Valturna (Aliskiren-Valsartan)    Medications Prior to Admission  Medication Sig Dispense Refill  . amLODipine (NORVASC) 10 MG tablet Take 10 mg by mouth daily.      Marland Kitchen aspirin EC 81 MG tablet Take 81 mg by mouth daily.      . fexofenadine (ALLEGRA) 180 MG tablet Take 180 mg by mouth daily.      . irbesartan (AVAPRO) 300 MG tablet Take 300 mg by mouth at bedtime.      . Multiple Vitamin (MULTIVITAMIN WITH MINERALS) TABS  Take 1 tablet by mouth daily.      . rosuvastatin (CRESTOR) 5 MG tablet Take 5 mg by mouth daily.        Home: Home Living Lives With: Alone Type of Home: Apartment Home Access: Stairs to enter Entrance Stairs-Number of Steps: 2 Home Layout: One level Firefighter: Standard Home Adaptive Equipment: None  Functional History: Prior Function Able to Take Stairs?: Yes Driving: Yes Vocation: Volunteer work Functional Status:  Mobility: Bed Mobility Bed Mobility: Supine to Sit;Sitting -  Scoot to Edge of Bed Supine to Sit: 3: Mod assist;HOB flat;With rails Supine to Sit: Patient Percentage: 50% Sitting - Scoot to Edge of Bed: 3: Mod assist Transfers Transfers: Sit to Stand;Stand to Sit;Stand Pivot Transfers Sit to Stand: From bed;With upper extremity assist;With armrests;1: +2 Total assist Sit to Stand: Patient Percentage: 60% Stand to Sit: To bed;To chair/3-in-1;Without upper extremity assist;With upper extremity assist;1: +2 Total assist Stand to Sit: Patient Percentage: 60% Stand Pivot Transfers: 1: +2 Total assist Stand Pivot Transfers: Patient Percentage: 50% Ambulation/Gait Ambulation/Gait Assistance: Not tested (comment);1: +2 Total assist Ambulation/Gait: Patient Percentage: 50% Ambulation Distance (Feet): 2 Feet Assistive device: Rolling walker Ambulation/Gait Assistance Details: pt assisted with stabiliy at pelvis to help control weight shift and stepping to get to chair Gait Pattern: Decreased step length - left;Decreased stance time - right;Step-to pattern Gait velocity: decreased General Gait Details: Pt with limited distance. Pt c/o fatigue after activity Stairs: No Wheelchair Mobility Wheelchair Mobility: No  ADL:    Cognition: Cognition Arousal/Alertness: Awake/alert Orientation Level: Oriented X4 Cognition Arousal/Alertness: Awake/alert Behavior During Therapy: WFL for tasks performed Orientation Level: Appears intact for tasks assessed;Oriented X4 / Intact  Blood pressure 147/61, pulse 81, temperature 97.9 F (36.6 C), temperature source Oral, resp. rate 16, height 5\' 3"  (1.6 m), weight 67.297 kg (148 lb 5.8 oz), SpO2 97.00%. Physical Exam  Nursing note and vitals reviewed. Constitutional: She is oriented to person, place, and time. She appears well-developed and well-nourished.  HENT:  Head: Normocephalic and atraumatic.  Eyes: Pupils are equal, round, and reactive to light.  Neck: Normal range of motion.  Cardiovascular: Normal rate  and regular rhythm.   Pulmonary/Chest: Effort normal and breath sounds normal.  Abdominal: She exhibits distension. Bowel sounds are increased. There is no tenderness.  Mid-line incision clean and dry with steri strips in place.  Musculoskeletal: She exhibits edema (2+ pedal edema bilaterally). She exhibits no tenderness.  Neurological: She is alert and oriented to person, place, and time.  Skin: Skin is warm and dry.  Motor strength: 4/5 in bilateral deltoid, biceps, triceps, grip, hip flexor, knee extensors, ankle dorsiflexor plantar flexor Sensory intact to light touch in bilateral upper and lower limbs.  Results for orders placed during the hospital encounter of 12/31/12 (from the past 24 hour(s))  GLUCOSE, CAPILLARY     Status: Abnormal   Collection Time    01/15/13 11:41 AM      Result Value Range   Glucose-Capillary 137 (*) 70 - 99 mg/dL  GLUCOSE, CAPILLARY     Status: Abnormal   Collection Time    01/15/13  4:38 PM      Result Value Range   Glucose-Capillary 113 (*) 70 - 99 mg/dL   Comment 1 Documented in Chart     Comment 2 Notify RN    GLUCOSE, CAPILLARY     Status: Abnormal   Collection Time    01/15/13  8:50 PM      Result Value Range  Glucose-Capillary 146 (*) 70 - 99 mg/dL  RENAL FUNCTION PANEL     Status: Abnormal   Collection Time    01/16/13  6:00 AM      Result Value Range   Sodium 134 (*) 135 - 145 mEq/L   Potassium 3.9  3.5 - 5.1 mEq/L   Chloride 97  96 - 112 mEq/L   CO2 23  19 - 32 mEq/L   Glucose, Bld 132 (*) 70 - 99 mg/dL   BUN 56 (*) 6 - 23 mg/dL   Creatinine, Ser 1.61 (*) 0.50 - 1.10 mg/dL   Calcium 8.0 (*) 8.4 - 10.5 mg/dL   Phosphorus 7.5 (*) 2.3 - 4.6 mg/dL   Albumin 1.9 (*) 3.5 - 5.2 g/dL   GFR calc non Af Amer 7 (*) >90 mL/min   GFR calc Af Amer 8 (*) >90 mL/min  GLUCOSE, CAPILLARY     Status: Abnormal   Collection Time    01/16/13  7:43 AM      Result Value Range   Glucose-Capillary 110 (*) 70 - 99 mg/dL   Comment 1 Notify RN     No  results found.  Assessment/Plan: Diagnosis: And deconditioning after respiratory failure and renal failure. 1. Does the need for close, 24 hr/day medical supervision in concert with the patient's rehab needs make it unreasonable for this patient to be served in a less intensive setting? Yes 2. Co-Morbidities requiring supervision/potential complications: Atrial fibrillation with history of rapid ventricular response, recent surgery for small bowel obstruction 3. Due to bladder management, bowel management, safety, skin/wound care, disease management, medication administration and patient education, does the patient require 24 hr/day rehab nursing? Yes 4. Does the patient require coordinated care of a physician, rehab nurse, PT (once to hrs/day, 5 days/week) and OT (1-2 hrs/day, 5 days/week) to address physical and functional deficits in the context of the above medical diagnosis(es)? Yes Addressing deficits in the following areas: balance, endurance, locomotion, strength, transferring, bowel/bladder control, bathing, dressing, feeding, grooming, toileting and psychosocial support 5. Can the patient actively participate in an intensive therapy program of at least 3 hrs of therapy per day at least 5 days per week? Yes 6. The potential for patient to make measurable gains while on inpatient rehab is excellent 7. Anticipated functional outcomes upon discharge from inpatient rehab are Supervision to modified independent mobility with PT, Supervision modified independent ADLs with OT, Not applicable with SLP. 8. Estimated rehab length of stay to reach the above functional goals is: 7-10 days 9. Does the patient have adequate social supports to accommodate these discharge functional goals? Potentially 10. Anticipated D/C setting: Home 11. Anticipated post D/C treatments: HH therapy 12. Overall Rehab/Functional Prognosis: good  RECOMMENDATIONS: This patient's condition is appropriate for continued  rehabilitative care in the following setting: CIR Patient has agreed to participate in recommended program. Yes Note that insurance prior authorization may be required for reimbursement for recommended care.  Comment:    01/16/2013

## 2013-01-17 ENCOUNTER — Encounter: Payer: Self-pay | Admitting: Adult Health

## 2013-01-17 LAB — CBC
MCH: 29.2 pg (ref 26.0–34.0)
MCV: 81.2 fL (ref 78.0–100.0)
Platelets: 152 10*3/uL (ref 150–400)
RDW: 14 % (ref 11.5–15.5)

## 2013-01-17 LAB — RENAL FUNCTION PANEL
Albumin: 1.9 g/dL — ABNORMAL LOW (ref 3.5–5.2)
BUN: 65 mg/dL — ABNORMAL HIGH (ref 6–23)
Creatinine, Ser: 5.98 mg/dL — ABNORMAL HIGH (ref 0.50–1.10)
Phosphorus: 8.5 mg/dL — ABNORMAL HIGH (ref 2.3–4.6)

## 2013-01-17 LAB — GLUCOSE, CAPILLARY: Glucose-Capillary: 91 mg/dL (ref 70–99)

## 2013-01-17 MED ORDER — INSULIN ASPART 100 UNIT/ML ~~LOC~~ SOLN
0.0000 [IU] | Freq: Three times a day (TID) | SUBCUTANEOUS | Status: DC
  Administered 2013-01-17 – 2013-01-18 (×2): 2 [IU] via SUBCUTANEOUS
  Administered 2013-01-18: 3 [IU] via SUBCUTANEOUS
  Administered 2013-01-18 – 2013-01-20 (×4): 2 [IU] via SUBCUTANEOUS
  Administered 2013-01-21 – 2013-01-22 (×2): 3 [IU] via SUBCUTANEOUS
  Administered 2013-01-22 – 2013-01-23 (×3): 2 [IU] via SUBCUTANEOUS
  Administered 2013-01-23: 3 [IU] via SUBCUTANEOUS
  Administered 2013-01-24 – 2013-01-26 (×4): 2 [IU] via SUBCUTANEOUS
  Administered 2013-01-27: 3 [IU] via SUBCUTANEOUS
  Administered 2013-01-28: 18:00:00 via SUBCUTANEOUS
  Administered 2013-01-28: 2 [IU] via SUBCUTANEOUS

## 2013-01-17 MED ORDER — INSULIN ASPART 100 UNIT/ML ~~LOC~~ SOLN: 0.0000 [IU] | Freq: Every day | SUBCUTANEOUS | Status: DC

## 2013-01-17 MED ORDER — FUROSEMIDE 10 MG/ML IJ SOLN
160.0000 mg | Freq: Two times a day (BID) | INTRAVENOUS | Status: AC
  Administered 2013-01-17 – 2013-01-18 (×2): 160 mg via INTRAVENOUS
  Filled 2013-01-17 (×3): qty 16

## 2013-01-17 MED ORDER — AMLODIPINE BESYLATE 10 MG PO TABS
10.0000 mg | ORAL_TABLET | Freq: Every day | ORAL | Status: DC
  Administered 2013-01-17 – 2013-01-28 (×12): 10 mg via ORAL
  Filled 2013-01-17 (×12): qty 1

## 2013-01-17 NOTE — Progress Notes (Signed)
Physical Therapy Treatment Patient Details Name: [name] MRN: [id number] DOB: [birth year] Today's Date: 01/17/2013 Time: 1191-4782 PT Time Calculation (min): 28 min  PT Assessment / Plan / Recommendation Comments on Treatment Session  Pt feels weaker today and not able ambulate as far, but overall continues to improve.  Feel she would be able to tolerate and would benefit from PT at CIR level.     Follow Up Recommendations  CIR     Does the patient have the potential to tolerate intense rehabilitation     Barriers to Discharge        Equipment Recommendations  Rolling walker with 5" wheels    Recommendations for Other Services OT consult  Frequency Min 3X/week   Plan Discharge plan needs to be updated;Frequency remains appropriate    Precautions / Restrictions Precautions Precautions: Fall Restrictions Weight Bearing Restrictions: No   Pertinent Vitals/Pain No c/o pain    Mobility  Bed Mobility Bed Mobility: Supine to Sit;Sitting - Scoot to Edge of Bed Supine to Sit: HOB flat;With rails;3: Mod assist Sitting - Scoot to Edge of Bed: 4: Min assist Details for Bed Mobility Assistance: Cues for technique.  (A) to bring shoulders/trunk to sitting upright.  Pt said she had more difficulty moving in the bed today. Tremor noted in LUE and neck Transfers Transfers: Sit to Stand;Stand to Sit Sit to Stand: With upper extremity assist;From bed;3: Mod assist Stand to Sit: 4: Min assist;With upper extremity assist;With armrests;To chair/3-in-1 Details for Transfer Assistance: Mod assist to initially lift hips off bed and achieve stability in standing   Ambulation/Gait Ambulation/Gait Assistance: 4: Min assist;Other (comment) (2nd person followed with recliner) Ambulation Distance (Feet): 3 Feet Assistive device: Rolling walker Ambulation/Gait Assistance Details: assist at pelvis for stability. Pt reported she felt weak today and was not able to walk far. Gait Pattern:  Step-through pattern;Decreased stride length;Wide base of support Gait velocity: decreased General Gait Details: Pt with limited endurance today. Needed assist for stability    Exercises General Exercises - Lower Extremity Ankle Circles/Pumps: AROM;Both;10 reps;Supine Quad Sets: AROM;Both;5 reps;Supine Gluteal Sets: AROM;Both;5 reps;Standing Short Arc Quad: AROM;Both;20 reps;Supine Long Arc Quad: AROM;Both;10 reps Hip ABduction/ADduction: Both;5 reps;AAROM;Supine Hip Flexion/Marching: AAROM;Both;5 reps;Supine   PT Diagnosis:    PT Problem List:   PT Treatment Interventions:     PT Goals Acute Rehab PT Goals Time For Goal Achievement: 01/16/13 Potential to Achieve Goals: Fair Pt will go Supine/Side to Sit: with modified independence PT Goal: Supine/Side to Sit - Progress: Progressing toward goal Pt will go Sit to Supine/Side: with modified independence PT Goal: Sit to Supine/Side - Progress: Progressing toward goal Pt will go Sit to Stand: with modified independence PT Goal: Sit to Stand - Progress: Progressing toward goal Pt will go Stand to Sit: with modified independence Pt will Ambulate: 16 - 50 feet;with rolling walker  Visit Information  Last PT Received On: 01/17/13 Assistance Needed: +1    Subjective Data  Subjective: "I feel weaker today" Patient Stated Goal: wants to go to rehab   Cognition  Cognition Arousal/Alertness: Awake/alert Behavior During Therapy: WFL for tasks assessed/performed Overall Cognitive Status: Within Functional Limits for tasks assessed    Balance     End of Session PT - End of Session Equipment Utilized During Treatment: Gait belt Activity Tolerance: Patient tolerated treatment well Patient left: in chair Nurse Communication: Mobility status   GP    Bayard Hugger. [place], [place] [phone] 01/17/2013, 1:02 PM

## 2013-01-17 NOTE — Progress Notes (Signed)
Impression/Plan  Acute kidney injury-s/p SBO resection and ATN  s/p HD x 3. Had urinary retention requiring foley catheter.  Last Hemodialysis 3days ago; will hold  Plan: Need to diurese and if no reduction in volume, then restart dialysis.  Subjective: Interval History: Poor appetite  Objective: Vital signs in last 24 hours: Temp:  [97.9 F (36.6 C)-99 F (37.2 C)] 99 F (37.2 C) (04/17 1117) Pulse Rate:  [78-82] 82 (04/17 0455) Resp:  [16-20] 20 (04/17 1117) BP: (101-181)/(59-96) 179/67 mmHg (04/17 1138) SpO2:  [94 %-100 %] 94 % (04/17 1117) Weight:  [68.4 kg (150 lb 12.7 oz)] 68.4 kg (150 lb 12.7 oz) (04/16 2018) Weight change: 1.103 kg (2 lb 6.9 oz)  Intake/Output from previous day: 04/16 0701 - 04/17 0700 In: 240 [P.O.:240] Out: 400 [Urine:400] Intake/Output this shift: Total I/O In: 240 [P.O.:240] Out: -   General appearance: alert and cooperative Resp: diminished breath sounds bibasilar Chest wall: no tenderness Cardio: regular rate and rhythm, S1, S2 normal, no murmur, click, rub or gallop Extremities: edema 2+ right>left  Lab Results:  Recent Labs  01/15/13 0500 01/17/13 0500  WBC 5.9 6.6  HGB 8.4* 8.1*  HCT 23.6* 22.5*  PLT 125* 152   BMET:  Recent Labs  01/16/13 0600 01/17/13 0500  NA 134* 131*  K 3.9 4.1  CL 97 95*  CO2 23 22  GLUCOSE 132* 131*  BUN 56* 65*  CREATININE 5.43* 5.98*  CALCIUM 8.0* 8.1*   No results found for this basename: PTH,  in the last 72 hours Iron Studies: No results found for this basename: IRON, TIBC, TRANSFERRIN, FERRITIN,  in the last 72 hours Studies/Results: No results found.  Scheduled: . amLODipine  10 mg Oral Daily  . antiseptic oral rinse  15 mL Mouth Rinse BID  . aspirin EC  81 mg Oral Daily  . atorvastatin  10 mg Oral q1800  . ciprofloxacin  2 drop Both Eyes Q4H while awake  . feeding supplement  1 Container Oral TID BM  . heparin subcutaneous  5,000 Units Subcutaneous Q8H  . insulin aspart  0-15  Units Subcutaneous TID WC  . insulin aspart  0-5 Units Subcutaneous QHS  . sodium chloride  10-40 mL Intracatheter Q12H    LOS: 17 days   Mael Delap C 01/17/2013,11:54 AM

## 2013-01-17 NOTE — Clinical Social Work Note (Signed)
CSW talked with [facility] Medicare case manager Mindi Junker to give her an update on patient's medical status and information provided. Mindi Junker informed that she will be keep updated on patient's medical progress and will send her any needed additional clinicals.  Genelle Bal, MSW, LCSW [phone]

## 2013-01-17 NOTE — Progress Notes (Signed)
CIR Admissions Coordinator: Met with patient at bedside to discuss CIR. Pt will need authorization from [facility] for CIR admission. Will need an OT evaluation to send to insurance. Explained to pt that [facility] will approve only CIR or SNF, not both. Discussed d/c plans for supervision when she returns home.  Patient requesting that I call her daughter to discuss options for rehabilitation. Have requested an OT eval.  For questions, call [phone]

## 2013-01-17 NOTE — Progress Notes (Signed)
Occupational Therapy Evaluation Patient Details Name: [name] MRN: [id number] DOB: September 20, 1929 Today's Date: 01/17/2013 Time: 9528-4132 OT Time Calculation (min): 36 min  OT Assessment / Plan / Recommendation Clinical Impression  Patient presents to OT with generalized weakness after prolonged hospitalization resulting in decreased independence and safety with ADLs. Patient will benefit from skilled OT to maximize functional independence.     OT Assessment  Patient needs continued OT Services    Follow Up Recommendations  CIR;SNF    Barriers to Discharge Decreased caregiver support pt is working on getting 24/7 assistance arranged  Equipment Recommendations  None recommended by OT    Recommendations for Other Services Rehab consult  Frequency  Min 3X/week    Precautions / Restrictions Precautions Precautions: Fall Restrictions Weight Bearing Restrictions: No   Pertinent Vitals/Pain     ADL  Eating/Feeding: Performed;Set up Where Assessed - Eating/Feeding: Chair Grooming: Wash/dry hands;Wash/dry face;Set up Where Assessed - Grooming: Supported sitting Upper Body Bathing: Moderate assistance Where Assessed - Upper Body Bathing: Supported sitting Lower Body Bathing: +1 Total assistance Where Assessed - Lower Body Bathing: Supported sitting Upper Body Dressing: Minimal assistance Where Assessed - Upper Body Dressing: Supported sitting Lower Body Dressing: +1 Total assistance Where Assessed - Lower Body Dressing: Supported sitting Toilet Transfer: Simulated;Minimal Dentist Method: Surveyor, minerals: Bedside commode (initial posterior lean in standing) Toileting - Clothing Manipulation and Hygiene: +1 Total assistance Where Assessed - Glass blower/designer Manipulation and Hygiene: Standing Equipment Used: Gait belt ADL Comments: Patient with decreased activity tolerance for ADLs, generalized weakness, decreased flexibility to  legs/feet.    OT Diagnosis: Generalized weakness  OT Treatment Interventions: Self-care/ADL training;Therapeutic exercise;DME and/or AE instruction;Therapeutic activities;Patient/family education   OT Goals Acute Rehab OT Goals OT Goal Formulation: With patient Time For Goal Achievement: 01/31/13 Potential to Achieve Goals: Good ADL Goals Pt Will Perform Grooming: with modified independence;Sitting, chair ADL Goal: Grooming - Progress: Goal set today Pt Will Perform Upper Body Bathing: with min assist;Sitting at sink ADL Goal: Upper Body Bathing - Progress: Goal set today Pt Will Perform Lower Body Bathing: Sit to stand from chair;with mod assist ADL Goal: Lower Body Bathing - Progress: Goal set today Pt Will Perform Upper Body Dressing: with min assist;Sitting, chair ADL Goal: Upper Body Dressing - Progress: Goal set today Pt Will Perform Lower Body Dressing: with mod assist;Sit to stand from chair ADL Goal: Lower Body Dressing - Progress: Goal set today Pt Will Transfer to Toilet: with supervision;Stand pivot transfer;3-in-1;with DME ADL Goal: Toilet Transfer - Progress: Goal set today Pt Will Perform Toileting - Clothing Manipulation: with min assist;Standing ADL Goal: Toileting - Clothing Manipulation - Progress: Goal set today Pt Will Perform Toileting - Hygiene: with mod assist;Standing at 3-in-1/toilet  Visit Information  Last OT Received On: 01/17/13 Assistance Needed: +1    Subjective Data  Subjective: "I feel weaker today" Patient Stated Goal: to return home after rehab   Prior Functioning     Home Living Lives With: Alone Available Help at Discharge: Other (Comment) (Patient working on a plan for 24/7 assistance) Type of Home: Apartment Home Access: Stairs to enter Secretary/administrator of Steps: 2 Home Layout: One level Bathroom Shower/Tub: Health visitor: Standard Home Adaptive Equipment: Hand-held shower hose;Grab bars in shower Prior  Function Level of Independence: Independent Able to Take Stairs?: Yes Driving: Yes Vocation: Volunteer work Comments: Patient reports she was extremely active PTA Communication Communication: No difficulties Dominant Hand: Right  Vision/Perception Vision - History Baseline Vision: Wears glasses all the time Patient Visual Report: No change from baseline   Cognition  Cognition Arousal/Alertness: Awake/alert Behavior During Therapy: WFL for tasks assessed/performed Overall Cognitive Status: Within Functional Limits for tasks assessed    Extremity/Trunk Assessment Right Upper Extremity Assessment RUE ROM/Strength/Tone: Within functional levels RUE Coordination: WFL - gross/fine motor (mild tremors noted BUE) Left Upper Extremity Assessment LUE ROM/Strength/Tone: WFL for tasks assessed LUE Coordination: WFL - gross/fine motor (mild tremors BUE)     Mobility Bed Mobility Bed Mobility: Supine to Sit;Sitting - Scoot to Edge of Bed Supine to Sit: HOB flat;With rails;3: Mod assist Sitting - Scoot to Edge of Bed: 4: Min assist Details for Bed Mobility Assistance: Cues for technique.  (A) to bring shoulders/trunk to sitting upright.  Pt said she had more difficulty moving in the bed today. Tremor noted in LUE and neck Transfers Sit to Stand: With upper extremity assist;From bed;3: Mod assist Stand to Sit: 4: Min assist;With upper extremity assist;With armrests;To chair/3-in-1 Details for Transfer Assistance: Mod assist to initially lift hips off bed and achieve stability in standing             End of Session OT - End of Session Equipment Utilized During Treatment: Gait belt Activity Tolerance: Patient limited by fatigue;Patient tolerated treatment well Patient left: in chair;with call bell/phone within reach;with family/visitor present Nurse Communication: Mobility status;Other (comment) (Pt's call bell not working; informed front desk staff)  GO     [name]  A 01/17/2013, 4:18 PM

## 2013-01-17 NOTE — Progress Notes (Signed)
NUTRITION FOLLOW UP  Intervention:    Continue Resource Breeze po TID, each supplement provides 250 kcal and 9 grams of protein  Recommend removal of renal restrictions to diet order to improve order choices  RD to continue to follow nutrition care plan  Nutrition Dx:   Inadequate oral intake now related to variable appetite evidenced by variable meal intake. Ongoing.  Goal:   Intake to meet >90% of estimated nutrition needs, improving.  Monitor:   PO intake, labs, weight trend.  Assessment:   Exp-lap 4/1. Noted foreign body imbedded in small bowel. Extubated 4/4. HD completed 4/8, 4/9, 4/10, 4/14. Required foley catheter on 4/15 - resulted in 900 cc uop.  SLP following for dysphagia; continues on Dysphagia 2 with thin liquids.  Pt reports that her appetite is slowly improving.  Receiving Resource Breeze TID. RN reports that pt is drinking at least 1 daily.  Evaluated by CIR.  Height: Ht Readings from Last 1 Encounters:  01/15/13 5\' 3"  (1.6 m)    Weight Status:   Wt Readings from Last 1 Encounters:  01/16/13 150 lb 12.7 oz (68.4 kg)  Wt variable with fluid status.  Re-estimated needs:  Kcal: 1500 - 1700 Protein: 65-75 gm Fluid: 1.2 L  Skin: abdominal incision  Diet Order: Dysphagia 2; thins; Renal 60-70   Intake/Output Summary (Last 24 hours) at 01/17/13 1207 Last data filed at 01/17/13 0925  Gross per 24 hour  Intake    360 ml  Output    400 ml  Net    -40 ml    Last BM: 4/14   Labs:   Recent Labs Lab 01/15/13 0500 01/16/13 0600 01/17/13 0500  NA 135 134* 131*  K 3.5 3.9 4.1  CL 99 97 95*  CO2 24 23 22   BUN 42* 56* 65*  CREATININE 4.27* 5.43* 5.98*  CALCIUM 7.7* 8.0* 8.1*  PHOS 6.0* 7.5* 8.5*  GLUCOSE 125* 132* 131*    CBG (last 3)   Recent Labs  01/16/13 1708 01/16/13 2046 01/17/13 0734  GLUCAP 110* 137* 105*    Scheduled Meds: . amLODipine  10 mg Oral Daily  . antiseptic oral rinse  15 mL Mouth Rinse BID  . aspirin EC  81  mg Oral Daily  . atorvastatin  10 mg Oral q1800  . ciprofloxacin  2 drop Both Eyes Q4H while awake  . feeding supplement  1 Container Oral TID BM  . furosemide  160 mg Intravenous BID  . heparin subcutaneous  5,000 Units Subcutaneous Q8H  . insulin aspart  0-15 Units Subcutaneous TID WC  . insulin aspart  0-5 Units Subcutaneous QHS  . sodium chloride  10-40 mL Intracatheter Q12H    Continuous Infusions:  none  Jarold Motto MS, RD, LDN Pager: [phone] After-hours pager: [phone]

## 2013-01-17 NOTE — Progress Notes (Signed)
PULMONARY  / CRITICAL CARE MEDICINE  Name: [name] MRN: [id number] DOB: [birth year]    ADMISSION DATE:  12/31/2012 CONSULTATION DATE:  12/31/2012  REFERRING MD :  EDP PRIMARY SERVICE:  PCCM  CHIEF COMPLAINT:  Abdominal pain/ SBO  BRIEF PATIENT DESCRIPTION: 79 y/o without significant medical problems who developed abdominal pain, nausea, and vomiting 3 days prior to presentation to [facility] ED.  Found to have high grade SBO with pneumatosis and portal venous gas. Exploratory Lap early am 01/01/13, found foreign body imbedded in Small Bowel ?Bezoar. Pt. with hypoxia with LLL airspace disease post op  SIGNIFICANT EVENTS / STUDIES:  3/31 - Presented with nausea, vomiting, abdominal pain 3/31 -  Abdominal CT >>> High grade distal SBO and evidence of bowel ischemia 4/01 - Exploratory Lap,  Placed on ARDS Protocol, a fib with RVR >> amiodarone added 4/03 - d/c ARDS protocol 4/04 - Weaning pressors.  In sinus rhythm.  Extubated 4/05 - improved uop but still only marginal per RN. Very deconditioned and sleepy but not confused. Off pressors and extubated. DC amio  ECHO - ef 65%, Gr1 diast dysfn and PASP 49 4/07 - Patient transferred from [facility] to [facility] for possible CVVH with rising serum Cr.  4/08 - HD started for ATN 2/2 shock 4/16 - progressing with PT, making some urine  LINES / TUBES: NGT 3/31 >>>4/5 Foley 3/31 >>>4/10 ETT 4/1>>>01/04/13 L IJ CVC 4/2>>>4/8 Rt IJ HD cath 4/8>>>  CULTURES: 4/2 Sputum>>> e coli pan S 4/2 Blood>>>ng  4/2 Urine >>>ng  ANTIBIOTICS: Zosyn 3/31 (abd coverage)>>>4/10  Subjective:  Feeling weak today.  Still very motivated.  Hoping to go to [employer].  Minimal appetite.   VITAL SIGNS: Temp:  [97.9 F (36.6 C)-98.8 F (37.1 C)] 98.8 F (37.1 C) (04/17 0455) Pulse Rate:  [78-82] 82 (04/17 0455) Resp:  [16-19] 18 (04/17 0455) BP: (101-171)/(59-81) 160/81 mmHg (04/17 0455) SpO2:  [94 %-100 %] 94 % (04/17 0455) Weight:  [150 lb 12.7 oz (68.4 kg)] 150 lb  12.7 oz (68.4 kg) (04/16 2018) FIO2  Room Air  INTAKE / OUTPUT: Intake/Output     04/16 0701 - 04/17 0700 04/17 0701 - 04/18 0700   P.O. 240    Total Intake(mL/kg) 240 (3.5)    Urine (mL/kg/hr) 400 (0.2)    Stool     Total Output 400     Net -160          Urine Occurrence     Stool Occurrence 1 x     PHYSICAL EXAMINATION: General: No distress, sitting up in bed Neuro: Alert, appropriate, normal strength HEENT: mm moist, Rt IJ site clean Cardiovascular: s1s2 regular Lungs: resps even non labored on RA, clear  Abdomen: mid-line wound site clean, non tender  Musculoskeletal: no edema Skin: no rashes  LABS:  Recent Labs Lab 01/11/13 0419  01/15/13 0500 01/16/13 0600 01/17/13 0500  HGB 10.0*  --  8.4*  --  8.1*  WBC 10.2  --  5.9  --  6.6  PLT 135*  --  125*  --  152  NA 137  < > 135 134* 131*  K 3.4*  < > 3.5 3.9 4.1  CL 101  < > 99 97 95*  CO2 28  < > 24 23 22   GLUCOSE 140*  < > 125* 132* 131*  BUN 34*  < > 42* 56* 65*  CREATININE 3.38*  < > 4.27* 5.43* 5.98*  CALCIUM 8.1*  < >  7.7* 8.0* 8.1*  PHOS 4.1  < > 6.0* 7.5* 8.5*  ALBUMIN 2.4*  < > 2.0* 1.9* 1.9*  < > = values in this interval not displayed.  Recent Labs Lab 01/16/13 0743 01/16/13 1158 01/16/13 1708 01/16/13 2046 01/17/13 0734  GLUCAP 110* 107* 110* 137* 105*    Imaging: No results found.               ASSESSMENT / PLAN:  Acute hypoxic respiratory failure> resolved Post-op with atelectasis >> Resolved.   Plan: Bronchial hygiene   Paroxysmal A fib after surgery >> transiently required amiodarone >> now in sinus rhythm. Grade 1 diastolic dysfx on Echo 4/05. Hx of hyperlipidemia. Hx HTN HTN Plan: Consider add home norvasc if BP cont to rise  Continue ASA, lipitor  Acute on chronic renal failure likely from ATN in setting of shock.  HD started 4/08. UOP picking up last 24h 4/15 Plan: Holding further HD for now, would restart if an urgent indication -renal optimistic for recovery.   Follow UOP and S Cr progress  Renal following - Scr cont to rise but no urgent indication HD at this time, suspect she will need more HD before we get stable fxn (based on rising BUN/Cr).   Distal SBO s/p laparotomy 4/1 with dx foreign body removal and peritonitis - pathology showed phytobezoar Dysphagia. Plan: Post-op care per CCS  Cont regular diet   Anemia of critical illness and chronic disease. Thrombocytopenia of critical illness. Hx of DVT with Factor V Leiden. Plan: monitor   Septic shock from aspiration pneumonia, and peritonitis >> resolved. Plan: Monitor off Abx  Hyperglycemia >> no hx of DM. Plan: SSI hgbA1c 4/12  >>>6.2 Blood sugars have been elevated.  Will add SSI (previously only had hs coverage from what I can tell)    Deconditioning. Plan: PT/OT Will likely need CIR vs SNF placement, await placement.  Has a niece and sister in law she could potentially stay with after discharge.   CIR accepted -- awaiting insurance approval  Have asked PT to work with her an extra visit this week.  May be able to d/c to CIR today if insurance approval complete and bed available.     Danford Bad, NP 01/17/2013  9:50 AM Pager: [phone] or [phone]  *Care during the described time interval was provided by me and/or other providers on the critical care team. I have reviewed this patient's available data, including medical history, events of note, physical examination and test results as part of my evaluation.  Levy Pupa, MD, PhD 01/17/2013, 1:06 PM [facility] [phone] or if no answer [phone]

## 2013-01-18 LAB — GLUCOSE, CAPILLARY
Glucose-Capillary: 130 mg/dL — ABNORMAL HIGH (ref 70–99)
Glucose-Capillary: 168 mg/dL — ABNORMAL HIGH (ref 70–99)

## 2013-01-18 LAB — RENAL FUNCTION PANEL
CO2: 21 mEq/L (ref 19–32)
GFR calc Af Amer: 6 mL/min — ABNORMAL LOW (ref >90)
Glucose, Bld: 160 mg/dL — ABNORMAL HIGH (ref 70–99)
Potassium: 3.8 mEq/L (ref 3.5–5.1)
Sodium: 129 mEq/L — ABNORMAL LOW (ref 135–145)

## 2013-01-18 MED ORDER — CALCIUM CARBONATE ANTACID 500 MG PO CHEW
1.0000 | CHEWABLE_TABLET | Freq: Three times a day (TID) | ORAL | Status: DC | PRN
  Administered 2013-01-24: 400 mg via ORAL
  Filled 2013-01-18 (×2): qty 2

## 2013-01-18 MED ORDER — FUROSEMIDE 10 MG/ML IJ SOLN
80.0000 mg | Freq: Two times a day (BID) | INTRAMUSCULAR | Status: AC
  Administered 2013-01-18 (×2): 80 mg via INTRAVENOUS
  Filled 2013-01-18 (×2): qty 8

## 2013-01-18 NOTE — Progress Notes (Signed)
Follow-up visit with patient to talk about options for her rehabilitation. Have called pt's son and daughter but have not heard back from them. Talked with pt's sister-in-law by phone, who is her only local family, to discuss plans. She reports that pt will need hired assistance when she returns home and thinks that SNF would be the better option for pt. Discussed this with pt who is in agreement, but would like to wait on a decision until her son comes from out of town  today to discuss with him. Continue to wait on status of pt's need for addditional HD, as this will affect pt's d/c plan. Pt understands the above issues and will discuss with her son this weekend. Will follow-up with pt on Monday. I can be reached at [phone].

## 2013-01-18 NOTE — Progress Notes (Signed)
Impression/Plan  Acute kidney injury-s/p SBO resection and ATN  s/p HD x 3, last HD 4/14. Had urinary retention requiring foley catheter.   Plan: Will continue to diurese as tolerated and hold on dialysis.  Subjective: Interval History: Good urine output yesterday with furosemide  Objective: Vital signs in last 24 hours: Temp:  [98 F (36.7 C)-99 F (37.2 C)] 98 F (36.7 C) (04/18 1000) Pulse Rate:  [77-85] 82 (04/18 1000) Resp:  [20] 20 (04/18 1000) BP: (144-181)/(59-96) 152/62 mmHg (04/18 1000) SpO2:  [93 %-100 %] 98 % (04/18 1000) Weight change:   Intake/Output from previous day: 04/17 0701 - 04/18 0700 In: 1320 [P.O.:1320] Out: 1500 [Urine:1500] Intake/Output this shift: Total I/O In: 240 [P.O.:240] Out: -   General appearance: alert and cooperative Resp: clear to auscultation bilaterally Chest wall: no tenderness Cardio: regular rate and rhythm, S1, S2 normal, no murmur, click, rub or gallop Extremities: edema 2+  Lab Results:  Recent Labs  01/17/13 0500  WBC 6.6  HGB 8.1*  HCT 22.5*  PLT 152   BMET:  Recent Labs  01/17/13 0500 01/18/13 0415  NA 131* 129*  K 4.1 3.8  CL 95* 92*  CO2 22 21  GLUCOSE 131* 160*  BUN 65* 72*  CREATININE 5.98* 6.34*  CALCIUM 8.1* 8.4   No results found for this basename: PTH,  in the last 72 hours Iron Studies: No results found for this basename: IRON, TIBC, TRANSFERRIN, FERRITIN,  in the last 72 hours Studies/Results: No results found.  Scheduled: . amLODipine  10 mg Oral Daily  . antiseptic oral rinse  15 mL Mouth Rinse BID  . aspirin EC  81 mg Oral Daily  . atorvastatin  10 mg Oral q1800  . ciprofloxacin  2 drop Both Eyes Q4H while awake  . feeding supplement  1 Container Oral TID BM  . heparin subcutaneous  5,000 Units Subcutaneous Q8H  . insulin aspart  0-15 Units Subcutaneous TID WC  . insulin aspart  0-5 Units Subcutaneous QHS  . sodium chloride  10-40 mL Intracatheter Q12H    LOS: 18 days    Clary Boulais C 01/18/2013,11:03 AM

## 2013-01-18 NOTE — Progress Notes (Signed)
Physical Therapy Treatment Patient Details Name: [name] MRN: [id number] DOB: 04-13-1929 Today's Date: 01/18/2013 Time: 4098-1191 PT Time Calculation (min): 15 min  PT Assessment / Plan / Recommendation Comments on Treatment Session  Pt progressing toward goals today with incr activity tolerance (incr gait distance and exercise reps compared to last session). Acute PT to continue to work on gait, balance, and strengthening while pt on acute.    Follow Up Recommendations  CIR           Equipment Recommendations  Rolling walker with 5" wheels    Recommendations for Other Services OT consult  Frequency Min 3X/week   Plan Discharge plan remains appropriate;Frequency remains appropriate    Precautions / Restrictions Precautions Precautions: Fall Restrictions Weight Bearing Restrictions: No       Mobility  Bed Mobility Bed Mobility: Not assessed Transfers Sit to Stand: 4: Min assist;From chair/3-in-1;With upper extremity assist Sit to Stand: Patient Percentage: 80% Stand to Sit: 4: Min assist;To chair/3-in-1;With armrests;With upper extremity assist Stand to Sit: Patient Percentage: 80% Details for Transfer Assistance: cues for hand placement and technique. pt with posterior lean with intial standing needed assist to center weight over BOS. Ambulation/Gait Ambulation/Gait Assistance: 4: Min assist Ambulation Distance (Feet): 30 Feet Assistive device: Rolling walker Ambulation/Gait Assistance Details: cues for posture, walker position. Unsteady gait pattern, with several episodes of balance issues needed assist to correct. pt with retro gait with stopping needed assist to maintain standing. Gait Pattern: Step-through pattern;Decreased stride length;Shuffle;Scissoring;Wide base of support Gait velocity: decreased    Exercises General Exercises - Lower Extremity Ankle Circles/Pumps: AROM;Both;10 reps;Seated Quad Sets: AROM;Strengthening;Both;10 reps;Seated Long Arc  Quad: AROM;Strengthening;Both;10 reps;Seated Straight Leg Raises: AROM;Strengthening;Both;10 reps;Seated     PT Goals Acute Rehab PT Goals Pt will go Sit to Stand: with modified independence PT Goal: Sit to Stand - Progress: Progressing toward goal Pt will go Stand to Sit: with modified independence PT Goal: Stand to Sit - Progress: Progressing toward goal Pt will Ambulate: 16 - 50 feet;with rolling walker PT Goal: Ambulate - Progress: Progressing toward goal  Visit Information  Last PT Received On: 01/18/13 Assistance Needed: +1    Subjective Data  Subjective: No new complaints, agreeable to therapy today. Patient Stated Goal: wants to go to rehab   Cognition  Cognition Behavior During Therapy: [facility] for tasks assessed/performed Overall Cognitive Status: Within Functional Limits for tasks assessed       End of Session PT - End of Session Equipment Utilized During Treatment: Gait belt Activity Tolerance: Patient tolerated treatment well Patient left: in chair;with call bell/phone within reach Nurse Communication: Mobility status   GP     Sallyanne Kuster 01/18/2013, 11:01 AM  Sallyanne Kuster, PTA Office- [phone]

## 2013-01-18 NOTE — Progress Notes (Signed)
PULMONARY  / CRITICAL CARE MEDICINE  Name: [name] MRN: [id number] DOB: 08-Nov-1928    ADMISSION DATE:  12/31/2012 CONSULTATION DATE:  12/31/2012  REFERRING MD :  EDP PRIMARY SERVICE:  PCCM  CHIEF COMPLAINT:  Abdominal pain/ SBO  BRIEF PATIENT DESCRIPTION: 79 y/o without significant medical problems who developed abdominal pain, nausea, and vomiting 3 days prior to presentation to [facility] ED.  Found to have high grade SBO with pneumatosis and portal venous gas. Exploratory Lap early am 01/01/13, found foreign body imbedded in Small Bowel ?Bezoar. Pt. with hypoxia with LLL airspace disease post op  SIGNIFICANT EVENTS / STUDIES:  3/31 - Presented with nausea, vomiting, abdominal pain 3/31 -  Abdominal CT >>> High grade distal SBO and evidence of bowel ischemia 4/01 - Exploratory Lap,  Placed on ARDS Protocol, a fib with RVR >> amiodarone added 4/03 - d/c ARDS protocol 4/04 - Weaning pressors.  In sinus rhythm.  Extubated 4/05 - improved uop but marginal per RN. Off pressors and extubated. DC amio.  ECHO - ef 65%, Gr1 diast dysfn & PASP 49 4/07 - Patient transferred from [facility] to [facility] for possible CVVH with rising serum Cr.  4/08 - HD started for ATN 2/2 shock 4/16 - progressing with PT, making some urine 4/18 - stood at sink, able to brush hair, unsteady gait  LINES / TUBES: NGT 3/31 >>>4/5 Foley 3/31 >>>4/10 ETT 4/1>>>01/04/13 L IJ CVC 4/2>>>4/8 Rt IJ HD cath 4/8>>>  CULTURES: 4/2 Sputum>>> e coli pan S 4/2 Blood>>>ng  4/2 Urine >>>ng  ANTIBIOTICS: Zosyn 3/31 (abd coverage)>>>4/10  Subjective:  Patient reports she was able to urinate for the first time today without a catheter.  OT notes she is unsteady on feet, easily fatigued  VITAL SIGNS: Temp:  [98 F (36.7 C)-99 F (37.2 C)] 98 F (36.7 C) (04/18 1000) Pulse Rate:  [77-85] 82 (04/18 1000) Resp:  [20] 20 (04/18 1000) BP: (144-162)/(59-84) 152/62 mmHg (04/18 1000) SpO2:  [93 %-100 %] 98 % (04/18 1000) FIO2  Room  Air  INTAKE / OUTPUT: Intake/Output     04/17 0701 - 04/18 0700 04/18 0701 - 04/19 0700   P.O. 1320 240   Total Intake(mL/kg) 1320 (19.3) 240 (3.5)   Urine (mL/kg/hr) 1500 (0.9)    Total Output 1500     Net -180 +240        Stool Occurrence 2 x 1 x    PHYSICAL EXAMINATION: General: No distress, working with OT at sink Neuro: Alert, appropriate, normal strength HEENT: mm moist, Rt IJ site clean Cardiovascular: s1s2 regular Lungs: resps even non labored on RA, clear  Abdomen: mid-line wound site clean, non tender  Musculoskeletal: no edema Skin: no rashes  LABS:  Recent Labs Lab 01/15/13 0500 01/16/13 0600 01/17/13 0500 01/18/13 0415  HGB 8.4*  --  8.1*  --   WBC 5.9  --  6.6  --   PLT 125*  --  152  --   NA 135 134* 131* 129*  K 3.5 3.9 4.1 3.8  CL 99 97 95* 92*  CO2 24 23 22 21   GLUCOSE 125* 132* 131* 160*  BUN 42* 56* 65* 72*  CREATININE 4.27* 5.43* 5.98* 6.34*  CALCIUM 7.7* 8.0* 8.1* 8.4  PHOS 6.0* 7.5* 8.5* 9.3*  ALBUMIN 2.0* 1.9* 1.9* 1.8*    Recent Labs Lab 01/17/13 1143 01/17/13 1655 01/17/13 2221 01/18/13 0711 01/18/13 1138  GLUCAP 144* 91 133* 130* 168*    Imaging: No results found.  ASSESSMENT / PLAN:  Acute hypoxic respiratory failure> resolved Post-op with atelectasis >> Resolved.   Plan: Bronchial hygiene  Paroxysmal A fib after surgery >> transiently required amiodarone >> now in sinus rhythm. Grade 1 diastolic dysfx on Echo 4/05. Hx of hyperlipidemia. Hx HTN HTN  Plan: Continue norvasc Continue ASA, lipitor  Acute on chronic renal failure likely from ATN in setting of shock.  HD started 4/08. UOP picking up last 24h 4/15  Plan:  Follow UOP and S Cr progress  Renal following - Scr cont to rise but no urgent indication HD at this time, suspect she will need more HD before we get stable fxn (based on rising BUN/Cr).  Attempting lasix.  Hopeful for recovery.   Will have to be declared acute or chronic before  discharge   Distal SBO s/p laparotomy 4/1 with dx foreign body removal and peritonitis - pathology showed phytobezoar Dysphagia.  Plan: Post-op care per [facility]  Cont regular diet   Anemia of critical illness and chronic disease. Thrombocytopenia of critical illness. Hx of DVT with Factor V Leiden.  Plan: monitor   Septic shock from aspiration pneumonia, and peritonitis >> resolved.  Plan: Monitor off Abx  Hyperglycemia >> no hx of DM.  Plan: SSI hgbA1c 4/12  >>>6.2 Blood sugars have been elevated.   SSI     Deconditioning.  Plan: PT/OT Will need SNF placement, await bed.  Declined by CIR.  Has a niece and sister in law she could potentially stay with after discharge.   Have asked PT to work with her an extra visit this week.    Canary Brim, NP-C [facility] Pgr: [phone] or [phone]    I have interviewed and examined the patient and reviewed the database. I have formulated the assessment and plan as reflected in the note above with amendments made by me.  Billy Fischer, MD;  PCCM service; Mobile [phone]

## 2013-01-18 NOTE — Progress Notes (Signed)
Occupational Therapy Treatment Patient Details Name: [name] MRN: [id number] DOB: 08-13-1929 Today's Date: 01/18/2013 Time: 4098-1191 OT Time Calculation (min): 39 min  OT Assessment / Plan / Recommendation Comments on Treatment Session Pt very pleasant and eager to participate in therapy sessions.  Continues to need min assist for sit to stand and standing balance during selfcare tasks.  She also continues to need increased rest breaks during tasks as well secondary to decreased endurance.  Still agree with continued OT at SNF level for rehab.    Follow Up Recommendations  SNF       Equipment Recommendations  None recommended by OT       Frequency Min 2X/week   Plan Discharge plan needs to be updated    Precautions / Restrictions Precautions Precautions: Fall   Pertinent Vitals/Pain O2 sats 94% on room air during session    ADL  Grooming: Performed;Wash/dry hands;Wash/dry face;Teeth care;Brushing hair;Minimal assistance Where Assessed - Grooming: Supported standing Toilet Transfer: Minimal assistance Statistician Method: Other (comment) (ambulate with RW) Acupuncturist: Bedside commode Toileting - Clothing Manipulation and Hygiene: Performed;Minimal assistance Where Assessed - Toileting Clothing Manipulation and Hygiene: Sit to stand from 3-in-1 or toilet Transfers/Ambulation Related to ADLs: Pt min assist for mobility using the RW for support.  Pt with jerky athetoid type of movements.  Needs assistance with advancing the RW and mod instructional cueing to stay inside of the walker. ADL Comments: Pt able to stand to perform grooming tasks but needed 2 rest breaks to complete.  Pt stood X1 for 5 mins with min assist and the second time for 3-4 mins.  Demonstrates decreased ability to release UEs from surface and maintain her balance, especially with bilateral tasks.    OT Goals ADL Goals ADL Goal: Grooming - Progress: Progressing toward goals ADL Goal:  Toilet Transfer - Progress: Progressing toward goals ADL Goal: Toileting - Clothing Manipulation - Progress: Progressing toward goals Pt Will Perform Toileting - Hygiene: with supervision ADL Goal: Toileting - Hygiene - Progress: Updated due to goal met  Visit Information  Last OT Received On: 01/18/13 Assistance Needed: +1    Subjective Data  Subjective: Is it OK to say that I'm tired now. Patient Stated Goal: To get stronger      Cognition  Cognition Arousal/Alertness: Awake/alert Behavior During Therapy: WFL for tasks assessed/performed Overall Cognitive Status: Within Functional Limits for tasks assessed    Mobility  Transfers Transfers: Sit to Stand Sit to Stand: 4: Min assist;With upper extremity assist Stand to Sit: 4: Min assist;With upper extremity assist Details for Transfer Assistance: Pt needs mod instructional cueing for hand placement during sit to stand and stand to sit transitions.       Balance Static Standing Balance Static Standing - Balance Support: Right upper extremity supported;Left upper extremity supported Static Standing - Level of Assistance: 4: Min assist Dynamic Standing Balance Dynamic Standing - Balance Support: No upper extremity supported Dynamic Standing - Level of Assistance: 3: Mod assist   End of Session OT - End of Session Equipment Utilized During Treatment: Gait belt Activity Tolerance: Patient limited by fatigue Patient left: in chair;with call bell/phone within reach Nurse Communication: Mobility status     Heidemarie Goodnow OTR/L Pager number [phone] 01/18/2013, 3:13 PM

## 2013-01-19 ENCOUNTER — Inpatient Hospital Stay (HOSPITAL_COMMUNITY): Payer: Medicare Other

## 2013-01-19 DIAGNOSIS — I10 Essential (primary) hypertension: Secondary | ICD-10-CM

## 2013-01-19 LAB — RENAL FUNCTION PANEL
Albumin: 1.8 g/dL — ABNORMAL LOW (ref 3.5–5.2)
BUN: 79 mg/dL — ABNORMAL HIGH (ref 6–23)
Creatinine, Ser: 6.51 mg/dL — ABNORMAL HIGH (ref 0.50–1.10)
Phosphorus: 10.2 mg/dL — ABNORMAL HIGH (ref 2.3–4.6)

## 2013-01-19 LAB — CBC
MCH: 28.8 pg (ref 26.0–34.0)
MCV: 81.1 fL (ref 78.0–100.0)
Platelets: 169 10*3/uL (ref 150–400)
RBC: 2.64 MIL/uL — ABNORMAL LOW (ref 3.87–5.11)
RDW: 14.1 % (ref 11.5–15.5)

## 2013-01-19 LAB — GLUCOSE, CAPILLARY: Glucose-Capillary: 141 mg/dL — ABNORMAL HIGH (ref 70–99)

## 2013-01-19 MED ORDER — FUROSEMIDE 10 MG/ML IJ SOLN
100.0000 mg | Freq: Two times a day (BID) | INTRAVENOUS | Status: AC
  Administered 2013-01-19 – 2013-01-20 (×2): 100 mg via INTRAVENOUS
  Filled 2013-01-19 (×3): qty 10

## 2013-01-19 NOTE — Progress Notes (Signed)
Physical Therapy Treatment Patient Details Name: [name] MRN: [id number] DOB: August 11, 1929 Today's Date: 01/19/2013 Time: 1191-4782 PT Time Calculation (min): 24 min  PT Assessment / Plan / Recommendation Comments on Treatment Session  Pt making steady progress.  Pt/family pursuing SNF placement to give pt longer duration of rehab.    Follow Up Recommendations  SNF     Does the patient have the potential to tolerate intense rehabilitation     Barriers to Discharge        Equipment Recommendations  Rolling walker with 5" wheels    Recommendations for Other Services    Frequency Min 3X/week   Plan Discharge plan needs to be updated;Frequency remains appropriate    Precautions / Restrictions Precautions Precautions: Fall   Pertinent Vitals/Pain N/A    Mobility  Bed Mobility Supine to Sit: 3: Mod assist;HOB elevated;With rails Sitting - Scoot to Edge of Bed: 3: Mod assist Details for Bed Mobility Assistance: Assist to bring trunk up and hips to EOB Transfers Sit to Stand: 4: Min assist;With upper extremity assist;From bed Stand to Sit: 4: Min assist;With upper extremity assist;With armrests;To chair/3-in-1 Details for Transfer Assistance: Verbal cues for hand placement Ambulation/Gait Ambulation/Gait Assistance: 4: Min assist Ambulation Distance (Feet): 40 Feet Assistive device: Rolling walker Ambulation/Gait Assistance Details: verbal cues for posture and to stay closer to walker. Gait Pattern: Step-through pattern;Decreased stride length;Trunk rotated posteriorly on left;Trunk flexed Gait velocity: decreased    Exercises     PT Diagnosis:    PT Problem List:   PT Treatment Interventions:     PT Goals Acute Rehab PT Goals PT Goal: Supine/Side to Sit - Progress: Progressing toward goal PT Goal: Sit to Stand - Progress: Progressing toward goal PT Goal: Stand to Sit - Progress: Progressing toward goal PT Goal: Ambulate - Progress: Progressing toward  goal  Visit Information  Last PT Received On: 01/19/13 Assistance Needed: +1    Subjective Data  Subjective: "I'm full of gas."   Cognition  Cognition Arousal/Alertness: Awake/alert Behavior During Therapy: WFL for tasks assessed/performed Overall Cognitive Status: Within Functional Limits for tasks assessed    Balance  Static Standing Balance Static Standing - Balance Support: Right upper extremity supported;Left upper extremity supported Static Standing - Level of Assistance: 4: Min assist  End of Session PT - End of Session Equipment Utilized During Treatment: Gait belt Activity Tolerance: Patient tolerated treatment well Patient left: in chair;with call bell/phone within reach Nurse Communication: Mobility status   GP     Delawrence Fridman 01/19/2013, 5:07 PM  [employer] PT [phone]

## 2013-01-19 NOTE — Progress Notes (Signed)
PULMONARY  / CRITICAL CARE MEDICINE  Name: [name] MRN: [id number] DOB: [birth year]    ADMISSION DATE:  12/31/2012 CONSULTATION DATE:  12/31/2012  REFERRING MD :  EDP PRIMARY SERVICE:  PCCM  CHIEF COMPLAINT:  Abdominal pain/ SBO  BRIEF PATIENT DESCRIPTION: 79 y/o without significant medical problems who developed abdominal pain, nausea, and vomiting 3 days prior to presentation to [facility] ED.  Found to have high grade SBO with pneumatosis and portal venous gas. Exploratory Lap early am 01/01/13, found foreign body imbedded in Small Bowel ?Bezoar. Pt. with hypoxia with LLL airspace disease post op  SIGNIFICANT EVENTS / STUDIES:  3/31 - Presented with nausea, vomiting, abdominal pain 3/31 -  Abdominal CT >>> High grade distal SBO and evidence of bowel ischemia 4/01 - Exploratory Lap,  Placed on ARDS Protocol, a fib with RVR >> amiodarone added 4/03 - d/c ARDS protocol 4/04 - Weaning pressors.  In sinus rhythm.  Extubated 4/05 - improved uop but marginal per RN. Off pressors and extubated. DC amio.  ECHO - ef 65%, Gr1 diast dysfn & PASP 49 4/07 - Patient transferred from [facility] to [facility] for possible CVVH with rising serum Cr.  4/08 - HD started for ATN 2/2 shock 4/16 - progressing with PT, making some urine 4/18 - stood at sink, able to brush hair, unsteady gait  LINES / TUBES: NGT 3/31 >>>4/5 Foley 3/31 >>>4/10 ETT 4/1>>>01/04/13 L IJ CVC 4/2>>>4/8 Rt IJ HD cath 4/8>>>  CULTURES: 4/2 Sputum>>> e coli pan S 4/2 Blood>>>ng  4/2 Urine >>>ng  ANTIBIOTICS: Zosyn 3/31 (abd coverage)>>>4/10  Subjective:  Patient reports she was able to urinate for the first time today without a catheter.  OT notes she is unsteady on feet, easily fatigued  VITAL SIGNS: Temp:  [97.8 F (36.6 C)-98.3 F (36.8 C)] 98.1 F (36.7 C) (04/19 0912) Pulse Rate:  [82-99] 82 (04/19 0912) Resp:  [19-20] 19 (04/19 0912) BP: (127-151)/(55-71) 145/71 mmHg (04/19 0912) SpO2:  [94 %-98 %] 97 % (04/19 0912) Weight:   [68.4 kg (150 lb 12.7 oz)] 68.4 kg (150 lb 12.7 oz) (04/18 2121) FIO2  Room Air  INTAKE / OUTPUT: Intake/Output     04/18 0701 - 04/19 0700 04/19 0701 - 04/20 0700   P.O. 1320 220   Total Intake(mL/kg) 1320 (19.3) 220 (3.2)   Urine (mL/kg/hr) 1000 (0.6)    Total Output 1000     Net +320 +220        Urine Occurrence 4 x    Stool Occurrence 4 x 1 x    PHYSICAL EXAMINATION: General: No distress, working with OT at sink Neuro: Alert, appropriate, normal strength HEENT: mm moist, Rt IJ site clean Cardiovascular: s1s2 regular Lungs: resps even non labored on RA, clear  Abdomen: mid-line wound site clean, non tender  Musculoskeletal: no edema Skin: no rashes  LABS:  Recent Labs Lab 01/15/13 0500  01/17/13 0500 01/18/13 0415 01/19/13 0400  HGB 8.4*  --  8.1*  --  7.6*  WBC 5.9  --  6.6  --  6.2  PLT 125*  --  152  --  169  NA 135  < > 131* 129* 130*  K 3.5  < > 4.1 3.8 3.9  CL 99  < > 95* 92* 92*  CO2 24  < > 22 21 21   GLUCOSE 125*  < > 131* 160* 120*  BUN 42*  < > 65* 72* 79*  CREATININE 4.27*  < > 5.98* 6.34* 6.51*  CALCIUM 7.7*  < > 8.1* 8.4 8.3*  PHOS 6.0*  < > 8.5* 9.3* 10.2*  ALBUMIN 2.0*  < > 1.9* 1.8* 1.8*  < > = values in this interval not displayed.  Recent Labs Lab 01/18/13 0711 01/18/13 1138 01/18/13 1618 01/18/13 2125 01/19/13 0739  GLUCAP 130* 168* 136* 149* 117*    Imaging: No results found.               ASSESSMENT / PLAN:  1) Acute hypoxic respiratory failure> resolved Post-op with atelectasis >> Resolved.  Plan: Bronchial hygiene Follow up CXR  2) Paroxysmal A fib after surgery >> transiently required amiodarone >> now in sinus rhythm. Grade 1 diastolic dysfx on Echo 4/05. Hx of hyperlipidemia. Hx HTN HTN Plan: Continue norvasc Continue ASA, lipitor  3) Acute on chronic renal failure likely from ATN in setting of shock.  HD x3 last 4/14. UOP picking up last 24h 4/15 Plan:  Follow UOP and S Cr progress  Renal following - Scr  cont to rise but no urgent indication HD at this time, suspect she will need more HD before we get stable fxn (based on rising BUN/Cr).  Attempting lasix.  Hopeful for recovery.   Will have to be declared acute or chronic before discharge  4) Distal SBO s/p laparotomy 4/1 with dx foreign body removal and peritonitis - pathology showed phytobezoar Dysphagia. Plan: Post-op care per [facility]  Cont regular diet   5) Anemia of critical illness and chronic disease. Thrombocytopenia of critical illness. Hx of DVT with Factor V Leiden. Plan: monitor  6) Septic shock from aspiration pneumonia, and peritonitis >> resolved. Plan: Monitor off Abx  7) Hyperglycemia >> no hx of DM. Plan: SSI hgbA1c 4/12  >>>6.2 Blood sugars have been elevated.   SSI    8) Deconditioning. Plan: PT/OT Will need SNF placement, await bed.  Declined by CIR.  Has a niece and sister in law she could potentially stay with after discharge.   Have asked PT to work with her an extra visit this week.   Lonzo Cloud. Kriste Basque, MD 01/19/13 @ 10:17AM

## 2013-01-19 NOTE — Progress Notes (Signed)
Impression/Plan  Acute kidney injury-s/p SBO resection and ATN  s/p HD x 3, last HD 4/14. Had urinary retention requiring foley catheter.  Plan: Will continue to diurese as tolerated and hold on dialysis.  Subjective: Interval History:No uremic symptoms.  Does not want to go back on HD (for now)  Objective: Vital signs in last 24 hours: Temp:  [97.8 F (36.6 C)-98.3 F (36.8 C)] 98.1 F (36.7 C) (04/19 0912) Pulse Rate:  [82-99] 82 (04/19 0912) Resp:  [19-20] 19 (04/19 0912) BP: (127-151)/(55-71) 145/71 mmHg (04/19 0912) SpO2:  [94 %-98 %] 97 % (04/19 0912) Weight:  [68.4 kg (150 lb 12.7 oz)] 68.4 kg (150 lb 12.7 oz) (04/18 2121) Weight change:   Intake/Output from previous day: 04/18 0701 - 04/19 0700 In: 1320 [P.O.:1320] Out: 1000 [Urine:1000] Intake/Output this shift: Total I/O In: 220 [P.O.:220] Out: -   General appearance: alert and cooperative Resp: clear to auscultation bilaterally Chest wall: no tenderness Cardio: regular rate and rhythm, S1, S2 normal, no murmur, click, rub or gallop GI: soft, non-tender; bowel sounds normal; no masses,  no organomegaly Extremities: edema 1-2+  Lab Results:  Recent Labs  01/17/13 0500 01/19/13 0400  WBC 6.6 6.2  HGB 8.1* 7.6*  HCT 22.5* 21.4*  PLT 152 169   BMET:  Recent Labs  01/18/13 0415 01/19/13 0400  NA 129* 130*  K 3.8 3.9  CL 92* 92*  CO2 21 21  GLUCOSE 160* 120*  BUN 72* 79*  CREATININE 6.34* 6.51*  CALCIUM 8.4 8.3*   No results found for this basename: PTH,  in the last 72 hours Iron Studies: No results found for this basename: IRON, TIBC, TRANSFERRIN, FERRITIN,  in the last 72 hours Studies/Results: No results found.  Scheduled: . amLODipine  10 mg Oral Daily  . antiseptic oral rinse  15 mL Mouth Rinse BID  . aspirin EC  81 mg Oral Daily  . atorvastatin  10 mg Oral q1800  . ciprofloxacin  2 drop Both Eyes Q4H while awake  . feeding supplement  1 Container Oral TID BM  . heparin subcutaneous   5,000 Units Subcutaneous Q8H  . insulin aspart  0-15 Units Subcutaneous TID WC  . sodium chloride  10-40 mL Intracatheter Q12H     LOS: 19 days   Marelin Tat C 01/19/2013,12:02 PM

## 2013-01-20 DIAGNOSIS — J90 Pleural effusion, not elsewhere classified: Secondary | ICD-10-CM

## 2013-01-20 LAB — RENAL FUNCTION PANEL
CO2: 20 mEq/L (ref 19–32)
Calcium: 8.4 mg/dL (ref 8.4–10.5)
Chloride: 91 mEq/L — ABNORMAL LOW (ref 96–112)
GFR calc Af Amer: 6 mL/min — ABNORMAL LOW (ref >90)
Glucose, Bld: 141 mg/dL — ABNORMAL HIGH (ref 70–99)
Sodium: 131 mEq/L — ABNORMAL LOW (ref 135–145)

## 2013-01-20 MED ORDER — FUROSEMIDE 10 MG/ML IJ SOLN
100.0000 mg | Freq: Two times a day (BID) | INTRAVENOUS | Status: AC
  Administered 2013-01-20 – 2013-01-21 (×2): 100 mg via INTRAVENOUS
  Filled 2013-01-20 (×2): qty 10

## 2013-01-20 NOTE — Progress Notes (Signed)
Impression/Plan  Acute kidney injury-s/p SBO resection and ATN  s/p HD x 3, last HD 4/14. Had urinary retention requiring foley catheter.   Plan: Will continue to diurese prn as tolerated and hold on dialysis as hopeful recovery is pending  Subjective: Interval History: No complaints  Objective: Vital signs in last 24 hours: Temp:  [97.5 F (36.4 C)-99.1 F (37.3 C)] 97.5 F (36.4 C) (04/20 1019) Pulse Rate:  [77-89] 77 (04/20 1019) Resp:  [18] 18 (04/20 1019) BP: (126-164)/(55-71) 126/55 mmHg (04/20 1019) SpO2:  [95 %-97 %] 96 % (04/20 1019) Weight change:   Intake/Output from previous day: 04/19 0701 - 04/20 0700 In: 380 [P.O.:320; IV Piggyback:60] Out: 300 [Urine:300] Intake/Output this shift: Total I/O In: -  Out: 1025 [Urine:1025]  General appearance: alert and cooperative Resp: diminished breath sounds bibasilar Cardio: regular rate and rhythm, S1, S2 normal, no murmur, click, rub or gallop GI: soft, non-tender; bowel sounds normal; no masses,  no organomegaly 2+ Edema  Lab Results:  Recent Labs  01/19/13 0400  WBC 6.2  HGB 7.6*  HCT 21.4*  PLT 169   BMET:  Recent Labs  01/19/13 0400 01/20/13 0500  NA 130* 131*  K 3.9 3.6  CL 92* 91*  CO2 21 20  GLUCOSE 120* 141*  BUN 79* 87*  CREATININE 6.51* 6.95*  CALCIUM 8.3* 8.4   No results found for this basename: PTH,  in the last 72 hours Iron Studies: No results found for this basename: IRON, TIBC, TRANSFERRIN, FERRITIN,  in the last 72 hours Studies/Results: Dg Chest 2 View  01/19/2013  *RADIOLOGY REPORT*  Clinical Data: Follow up pneumonia  CHEST - 2 VIEW  Comparison: 01/10/2013  Findings: The patient is rotated.  Left upper lobe opacity, suspicious for pneumonia, improved.  Moderate left pleural effusion, stable versus mildly increased.  Cardiomegaly.  Stable right IJ venous catheter.  IMPRESSION: Left upper lobe opacity, suspicious for pneumonia, improved.  Moderate left pleural effusion, stable  versus mildly increased.   Original Report Authenticated By: Charline Bills, M.D.     Scheduled: . amLODipine  10 mg Oral Daily  . antiseptic oral rinse  15 mL Mouth Rinse BID  . aspirin EC  81 mg Oral Daily  . atorvastatin  10 mg Oral q1800  . ciprofloxacin  2 drop Both Eyes Q4H while awake  . feeding supplement  1 Container Oral TID BM  . furosemide  100 mg Intravenous BID  . heparin subcutaneous  5,000 Units Subcutaneous Q8H  . insulin aspart  0-15 Units Subcutaneous TID WC  . sodium chloride  10-40 mL Intracatheter Q12H      LOS: 20 days   Mima Cranmore C 01/20/2013,3:07 PM

## 2013-01-20 NOTE — Progress Notes (Signed)
PULMONARY  / CRITICAL CARE MEDICINE  Name: [name] MRN: [id number] DOB: 1928-11-09    ADMISSION DATE:  12/31/2012 CONSULTATION DATE:  12/31/2012  REFERRING MD :  EDP PRIMARY SERVICE:  PCCM  CHIEF COMPLAINT:  Abdominal pain/ SBO  BRIEF PATIENT DESCRIPTION: 79 y/o without significant medical problems who developed abdominal pain, nausea, and vomiting 3 days prior to presentation to [facility] ED.  Found to have high grade SBO with pneumatosis and portal venous gas. Exploratory Lap early am 01/01/13, found foreign body imbedded in Small Bowel ?Bezoar. Pt. with hypoxia with LLL airspace disease post op  SIGNIFICANT EVENTS / STUDIES:  3/31 - Presented with nausea, vomiting, abdominal pain 3/31 -  Abdominal CT >>> High grade distal SBO and evidence of bowel ischemia 4/01 - Exploratory Lap,  Placed on ARDS Protocol, a fib with RVR >> amiodarone added 4/03 - d/c ARDS protocol 4/04 - Weaning pressors.  In sinus rhythm.  Extubated 4/05 - improved uop but marginal per RN. Off pressors and extubated. DC amio.  ECHO - ef 65%, Gr1 diast dysfn & PASP 49 4/07 - Patient transferred from [facility] to [facility] for possible CVVH with rising serum Cr.  4/08 - HD started for ATN 2/2 shock 4/16 - progressing with PT, making some urine 4/18 - stood at sink, able to brush hair, unsteady gait  LINES / TUBES: NGT 3/31 >>>4/5 Foley 3/31 >>>4/10 ETT 4/1>>>01/04/13 L IJ CVC 4/2>>>4/8 Rt IJ HD cath 4/8>>>  CULTURES: 4/2 Sputum>>> e coli pan S 4/2 Blood>>>ng  4/2 Urine >>>ng  ANTIBIOTICS: Zosyn 3/31 (abd coverage)>>>4/10  Subjective:  Patient reports she was able to urinate for the first time today without a catheter.  OT notes she is unsteady on feet, easily fatigued  VITAL SIGNS: Temp:  [97.7 F (36.5 C)-99.1 F (37.3 C)] 97.9 F (36.6 C) (04/20 0529) Pulse Rate:  [78-89] 78 (04/20 0529) Resp:  [18-19] 18 (04/20 0529) BP: (143-164)/(57-71) 143/57 mmHg (04/20 0529) SpO2:  [93 %-97 %] 97 % (04/20 0529) FIO2  Room  Air  INTAKE / OUTPUT: Intake/Output     04/19 0701 - 04/20 0700 04/20 0701 - 04/21 0700   P.O. 320    IV Piggyback 60    Total Intake(mL/kg) 380 (5.6)    Urine (mL/kg/hr) 300 (0.2)    Total Output 300     Net +80          Urine Occurrence 4 x    Stool Occurrence 2 x     PHYSICAL EXAMINATION: General: No distress, working with OT at sink Neuro: Alert, appropriate, normal strength HEENT: mm moist, Rt IJ site clean Cardiovascular: s1s2 regular Lungs: resps even non labored on RA, clear  Abdomen: mid-line wound site clean, non tender  Musculoskeletal: no edema Skin: no rashes  LABS:  Recent Labs Lab 01/15/13 0500  01/17/13 0500 01/18/13 0415 01/19/13 0400 01/20/13 0500  HGB 8.4*  --  8.1*  --  7.6*  --   WBC 5.9  --  6.6  --  6.2  --   PLT 125*  --  152  --  169  --   NA 135  < > 131* 129* 130* 131*  K 3.5  < > 4.1 3.8 3.9 3.6  CL 99  < > 95* 92* 92* 91*  CO2 24  < > 22 21 21 20   GLUCOSE 125*  < > 131* 160* 120* 141*  BUN 42*  < > 65* 72* 79* 87*  CREATININE 4.27*  < >  5.98* 6.34* 6.51* 6.95*  CALCIUM 7.7*  < > 8.1* 8.4 8.3* 8.4  PHOS 6.0*  < > 8.5* 9.3* 10.2* 10.4*  ALBUMIN 2.0*  < > 1.9* 1.8* 1.8* 2.0*  < > = values in this interval not displayed.  Recent Labs Lab 01/18/13 2125 01/19/13 0739 01/19/13 1145 01/19/13 1700 01/20/13 0739  GLUCAP 149* 117* 122* 141* 129*    Imaging: Dg Chest 2 View  01/19/2013  *RADIOLOGY REPORT*  Clinical Data: Follow up pneumonia  CHEST - 2 VIEW  Comparison: 01/10/2013  Findings: The patient is rotated.  Left upper lobe opacity, suspicious for pneumonia, improved.  Moderate left pleural effusion, stable versus mildly increased.  Cardiomegaly.  Stable right IJ venous catheter.  IMPRESSION: Left upper lobe opacity, suspicious for pneumonia, improved.  Moderate left pleural effusion, stable versus mildly increased.   Original Report Authenticated By: Charline Bills, M.D.                  ASSESSMENT / PLAN:  1) Acute hypoxic  respiratory failure> resolved CXR w/ persist left base atx/ effus & LUL scarring  Plan: Bronchial hygiene Continue to follow up CXR, consider CTscan  2) Paroxysmal A fib after surgery >> transiently required amiodarone >> now in sinus rhythm. Grade 1 diastolic dysfx on Echo 4/05. Hx of hyperlipidemia. Hx HTN HTN Plan: Continue norvasc Continue ASA, lipitor  3) Acute on chronic renal failure likely from ATN in setting of shock.  HD x3 last 4/14. UOP picking up last 24h 4/15 Plan:  Follow UOP and S Cr progress  Renal following - Scr cont to rise but no urgent indication HD at this time, suspect she will need more HD before we get stable fxn (based on rising BUN/Cr).  Attempting lasix.  Hopeful for recovery.   Will have to be declared acute or chronic before discharge  4) Distal SBO s/p laparotomy 4/1 with dx foreign body removal and peritonitis - pathology showed phytobezoar Dysphagia. Plan: Post-op care per [facility]  Cont regular diet   5) Anemia of critical illness and chronic disease. Thrombocytopenia of critical illness. Hx of DVT with Factor V Leiden. Plan: monitor  6) Septic shock from aspiration pneumonia, and peritonitis >> resolved. Plan: Monitor off Abx  7) Hyperglycemia >> no hx of DM. Plan: SSI hgbA1c 4/12  >>>6.2 Blood sugars have been elevated.   SSI    8) Deconditioning. Plan: PT/OT Will need SNF placement, await bed.  Declined by CIR.  Has a niece and sister in law she could potentially stay with after discharge.   Have asked PT to work with her an extra visit this week.   Lonzo Cloud. Kriste Basque, MD 01/20/13 @ 9:54AM

## 2013-01-21 LAB — GLUCOSE, CAPILLARY
Glucose-Capillary: 148 mg/dL — ABNORMAL HIGH (ref 70–99)
Glucose-Capillary: 111 mg/dL — ABNORMAL HIGH (ref 70–99)

## 2013-01-21 LAB — IRON AND TIBC
Iron: 30 ug/dL — ABNORMAL LOW (ref 42–135)
Saturation Ratios: 17 % — ABNORMAL LOW (ref 20–55)
TIBC: 181 ug/dL — ABNORMAL LOW (ref 250–470)
UIBC: 151 ug/dL (ref 125–400)

## 2013-01-21 LAB — RENAL FUNCTION PANEL
BUN: 90 mg/dL — ABNORMAL HIGH (ref 6–23)
CO2: 21 mEq/L (ref 19–32)
Calcium: 8.3 mg/dL — ABNORMAL LOW (ref 8.4–10.5)
Chloride: 92 mEq/L — ABNORMAL LOW (ref 96–112)
Creatinine, Ser: 6.31 mg/dL — ABNORMAL HIGH (ref 0.50–1.10)

## 2013-01-21 MED ORDER — BOOST / RESOURCE BREEZE PO LIQD: 1.0000 | Freq: Three times a day (TID) | ORAL | Status: DC

## 2013-01-21 MED ORDER — INSULIN ASPART 100 UNIT/ML ~~LOC~~ SOLN: 0.0000 [IU] | Freq: Three times a day (TID) | SUBCUTANEOUS | Status: DC

## 2013-01-21 MED ORDER — ACETAMINOPHEN 325 MG PO TABS: 650.0000 mg | ORAL_TABLET | ORAL | Status: AC | PRN

## 2013-01-21 MED ORDER — CALCIUM CARBONATE ANTACID 500 MG PO CHEW: 1.0000 | CHEWABLE_TABLET | Freq: Three times a day (TID) | ORAL | Status: AC | PRN

## 2013-01-21 MED ORDER — CIPROFLOXACIN HCL 0.3 % OP SOLN: 2.0000 [drp] | OPHTHALMIC | Status: DC

## 2013-01-21 NOTE — Progress Notes (Signed)
PT Cancellation Note  Patient Details Name: [name] MRN: [id number] DOB: [birth year]   Cancelled Treatment:    Reason Eval/Treat Not Completed: Fatigue/lethargy limiting ability to participate.  Attempted to see patient x2 today.  Reports she feels weak and tired, and politely refused PT.  Will return in am.   Vena Austria 01/21/2013, 1:33 PM [phone]

## 2013-01-21 NOTE — Progress Notes (Signed)
Subjective: Interval History: has complaints weak.  Objective: Vital signs in last 24 hours: Temp:  [97.4 F (36.3 C)-99.2 F (37.3 C)] 98.1 F (36.7 C) (04/21 0933) Pulse Rate:  [77-84] 82 (04/21 0933) Resp:  [18-19] 18 (04/21 0933) BP: (126-141)/(52-58) 131/52 mmHg (04/21 0933) SpO2:  [95 %-100 %] 95 % (04/21 0933) Weight:  [68.4 kg (150 lb 12.7 oz)] 68.4 kg (150 lb 12.7 oz) (04/20 2025) Weight change:   Intake/Output from previous day: 04/20 0701 - 04/21 0700 In: 860 [P.O.:600; I.V.:80; IV Piggyback:180] Out: 2125 [Urine:2125] Intake/Output this shift: Total I/O In: 120 [P.O.:120] Out: -   General appearance: alert, cooperative and pale Neck: RIJ cath Resp: diminished breath sounds bilaterally and rales bibasilar Cardio: S1, S2 normal and systolic murmur: systolic ejection 2/6, decrescendo at 2nd left intercostal space GI: pos bs, mod distension, liver down 4 cm Extremities: edema 2+  Lab Results:  Recent Labs  01/19/13 0400  WBC 6.2  HGB 7.6*  HCT 21.4*  PLT 169   BMET:  Recent Labs  01/20/13 0500 01/21/13 0500  NA 131* 132*  K 3.6 3.7  CL 91* 92*  CO2 20 21  GLUCOSE 141* 121*  BUN 87* 90*  CREATININE 6.95* 6.31*  CALCIUM 8.4 8.3*   No results found for this basename: PTH,  in the last 72 hours Iron Studies: No results found for this basename: IRON, TIBC, TRANSFERRIN, FERRITIN,  in the last 72 hours  Studies/Results: Dg Chest 2 View  01/19/2013  *RADIOLOGY REPORT*  Clinical Data: Follow up pneumonia  CHEST - 2 VIEW  Comparison: 01/10/2013  Findings: The patient is rotated.  Left upper lobe opacity, suspicious for pneumonia, improved.  Moderate left pleural effusion, stable versus mildly increased.  Cardiomegaly.  Stable right IJ venous catheter.  IMPRESSION: Left upper lobe opacity, suspicious for pneumonia, improved.  Moderate left pleural effusion, stable versus mildly increased.   Original Report Authenticated By: Charline Bills, M.D.     I  have reviewed the patient's current medications.  Assessment/Plan: 1 AKI/? CKD a little better today. Vol xs, but diuresing, hold off diuretics.  Mild acidemia.  ? baseline CR 2 Anemia check Fe 3 Nutrition eating better 4 SBO resolved 5 Debill P allow to diurese, eval anemia, epo    LOS: 21 days   [name] L 01/21/2013,10:10 AM

## 2013-01-21 NOTE — Progress Notes (Signed)
Utilization review completed.  

## 2013-01-21 NOTE — Progress Notes (Signed)
PULMONARY  / CRITICAL CARE MEDICINE  Name: [name] MRN: [id number] DOB: [birth year]    ADMISSION DATE:  12/31/2012 CONSULTATION DATE:  12/31/2012  REFERRING MD :  EDP PRIMARY SERVICE:  PCCM  CHIEF COMPLAINT:  Abdominal pain/ SBO  BRIEF PATIENT DESCRIPTION: 79 y/o without significant medical problems who developed abdominal pain, nausea, and vomiting 3 days prior to presentation to [facility] ED.  Found to have high grade SBO with pneumatosis and portal venous gas. Exploratory Lap early am 01/01/13, found foreign body imbedded in Small Bowel ?Bezoar. Pt. with hypoxia with LLL airspace disease post op  SIGNIFICANT EVENTS / STUDIES:  3/31 - Presented with nausea, vomiting, abdominal pain 3/31 -  Abdominal CT >>> High grade distal SBO and evidence of bowel ischemia 4/01 - Exploratory Lap,  Placed on ARDS Protocol, a fib with RVR >> amiodarone added 4/03 - d/c ARDS protocol 4/04 - Weaning pressors.  In sinus rhythm.  Extubated 4/05 - improved uop but marginal per RN. Off pressors and extubated. DC amio.  ECHO - ef 65%, Gr1 diast dysfn & PASP 49 4/07 - Patient transferred from [facility] to [facility] for possible CVVH with rising serum Cr.  4/08 - HD started for ATN 2/2 shock 4/16 - progressing with PT, making some urine 4/18 - stood at sink, able to brush hair, unsteady gait  LINES / TUBES: NGT 3/31 >>>4/5 Foley 3/31 >>>4/10 ETT 4/1>>>01/04/13 L IJ CVC 4/2>>>4/8 Rt IJ HD cath 4/8>>>  CULTURES: 4/2 Sputum>>> e coli pan S 4/2 Blood>>>ng  4/2 Urine >>>ng  ANTIBIOTICS: Zosyn 3/31 (abd coverage)>>>4/10  Subjective:   Feeling weak and tired today.  Was "up all night going to the bathroom" after lasix.    VITAL SIGNS: Temp:  [97.4 F (36.3 C)-99.2 F (37.3 C)] 98.1 F (36.7 C) (04/21 0933) Pulse Rate:  [77-84] 82 (04/21 0933) Resp:  [18-19] 18 (04/21 0933) BP: (126-141)/(52-58) 131/52 mmHg (04/21 0933) SpO2:  [95 %-100 %] 95 % (04/21 0933) Weight:  [150 lb 12.7 oz (68.4 kg)] 150 lb 12.7 oz (68.4  kg) (04/20 2025) FIO2  Room Air  INTAKE / OUTPUT: Intake/Output     04/20 0701 - 04/21 0700 04/21 0701 - 04/22 0700   P.O. 600 120   I.V. (mL/kg) 80 (1.2)    IV Piggyback 180    Total Intake(mL/kg) 860 (12.6) 120 (1.8)   Urine (mL/kg/hr) 2125 (1.3)    Total Output 2125     Net -1265 +120        Urine Occurrence 4 x    Stool Occurrence 1 x     PHYSICAL EXAMINATION: General: No distress, OOB in chair  Neuro: Alert, appropriate, weak HEENT: mm moist, Rt IJ site clean Cardiovascular: s1s2 regular Lungs: resps even non labored on RA, clear  Abdomen: mid-line wound site clean, non tender  Musculoskeletal: no edema Skin: no rashes  LABS:  Recent Labs Lab 01/15/13 0500  01/17/13 0500  01/19/13 0400 01/20/13 0500 01/21/13 0500  HGB 8.4*  --  8.1*  --  7.6*  --   --   WBC 5.9  --  6.6  --  6.2  --   --   PLT 125*  --  152  --  169  --   --   NA 135  < > 131*  < > 130* 131* 132*  K 3.5  < > 4.1  < > 3.9 3.6 3.7  CL 99  < > 95*  < > 92* 91*  92*  CO2 24  < > 22  < > 21 20 21   GLUCOSE 125*  < > 131*  < > 120* 141* 121*  BUN 42*  < > 65*  < > 79* 87* 90*  CREATININE 4.27*  < > 5.98*  < > 6.51* 6.95* 6.31*  CALCIUM 7.7*  < > 8.1*  < > 8.3* 8.4 8.3*  PHOS 6.0*  < > 8.5*  < > 10.2* 10.4* 10.0*  ALBUMIN 2.0*  < > 1.9*  < > 1.8* 2.0* 2.0*  < > = values in this interval not displayed.  Recent Labs Lab 01/20/13 0739 01/20/13 1144 01/20/13 1737 01/20/13 2027 01/21/13 0739  GLUCAP 129* 119* 165* 148* 111*    Imaging: Dg Chest 2 View  01/19/2013  *RADIOLOGY REPORT*  Clinical Data: Follow up pneumonia  CHEST - 2 VIEW  Comparison: 01/10/2013  Findings: The patient is rotated.  Left upper lobe opacity, suspicious for pneumonia, improved.  Moderate left pleural effusion, stable versus mildly increased.  Cardiomegaly.  Stable right IJ venous catheter.  IMPRESSION: Left upper lobe opacity, suspicious for pneumonia, improved.  Moderate left pleural effusion, stable versus mildly  increased.   Original Report Authenticated By: Charline Bills, M.D.                  ASSESSMENT / PLAN:  1) Acute hypoxic respiratory failure> resolved CXR w/ persist left base atx/ effus & LUL scarring  Plan: Bronchial hygiene Continue to follow up CXR, consider CTscan  2) Paroxysmal A fib after surgery >> transiently required amiodarone >> now in sinus rhythm. Grade 1 diastolic dysfx on Echo 4/05. Hx of hyperlipidemia. Hx HTN HTN Plan: Continue norvasc Continue ASA, lipitor  3) Acute on chronic renal failure likely from ATN in setting of shock.  HD x3 last 4/14. UOP picking up with lasix Plan:  Follow UOP and S Cr progress  Renal following - Scr cont to rise but no urgent indication HD at this time, suspect she will need more HD before we get stable fxn (based on rising BUN/Cr).  Attempting lasix.  Hopeful for recovery.   Will have to be declared acute or chronic before discharge  4) Distal SBO s/p laparotomy 4/1 with dx foreign body removal and peritonitis - pathology showed phytobezoar Dysphagia. Plan: Post-op care per CCS  Cont regular diet   5) Anemia of critical illness and chronic disease. Thrombocytopenia of critical illness. Hx of DVT with Factor V Leiden. Plan: monitor  6) Septic shock from aspiration pneumonia, and peritonitis >> resolved. Plan: Monitor off Abx  7) Hyperglycemia >> no hx of DM. Plan: SSI hgbA1c 4/12  >>>6.2 SSI    8) Deconditioning. Plan: PT/OT Will need SNF placement, await bed.  Declined by CIR.  Has a niece and sister in law she could potentially stay with after discharge.    Will ask [facility] to assume care while waiting for return renal function.   Danford Bad, NP 01/21/2013  10:18 AM Pager: [phone] or [phone]  *Care during the described time interval was provided by me and/or other providers on the critical care team. I have reviewed this patient's available data, including medical history, events of  note, physical examination and test results as part of my evaluation.    Dr. Kalman Shan, M.D., [facility].C.P Pulmonary and Critical Care Medicine Staff Physician [facility] [place] [facility] Pager: [phone], If no answer or between  15:00h - 7:00h: call [phone]  01/21/2013 5:24 PM

## 2013-01-22 ENCOUNTER — Inpatient Hospital Stay (HOSPITAL_COMMUNITY): Payer: Medicare Other

## 2013-01-22 LAB — RENAL FUNCTION PANEL
BUN: 100 mg/dL — ABNORMAL HIGH (ref 6–23)
Calcium: 8.2 mg/dL — ABNORMAL LOW (ref 8.4–10.5)
Creatinine, Ser: 6.47 mg/dL — ABNORMAL HIGH (ref 0.50–1.10)
Glucose, Bld: 193 mg/dL — ABNORMAL HIGH (ref 70–99)
Phosphorus: 10.3 mg/dL — ABNORMAL HIGH (ref 2.3–4.6)

## 2013-01-22 LAB — CBC
HCT: 19.5 % — ABNORMAL LOW (ref 36.0–46.0)
MCH: 28.9 pg (ref 26.0–34.0)
MCHC: 35.9 g/dL (ref 30.0–36.0)
MCV: 80.6 fL (ref 78.0–100.0)
RDW: 13.9 % (ref 11.5–15.5)
WBC: 4.3 10*3/uL (ref 4.0–10.5)

## 2013-01-22 LAB — GLUCOSE, CAPILLARY
Glucose-Capillary: 135 mg/dL — ABNORMAL HIGH (ref 70–99)
Glucose-Capillary: 102 mg/dL — ABNORMAL HIGH (ref 70–99)

## 2013-01-22 MED ORDER — POTASSIUM CHLORIDE CRYS ER 20 MEQ PO TBCR
40.0000 meq | EXTENDED_RELEASE_TABLET | Freq: Once | ORAL | Status: AC
  Administered 2013-01-22: 40 meq via ORAL
  Filled 2013-01-22: qty 2

## 2013-01-22 MED ORDER — SODIUM CHLORIDE 0.9 % IV SOLN
1020.0000 mg | Freq: Once | INTRAVENOUS | Status: AC
  Administered 2013-01-22: 1020 mg via INTRAVENOUS
  Filled 2013-01-22 (×2): qty 34

## 2013-01-22 NOTE — Progress Notes (Signed)
Occupational Therapy Treatment Patient Details Name: [name] MRN: [id number] DOB: 1929-03-07 Today's Date: 01/22/2013 Time: 2536-6440 OT Time Calculation (min): 29 min  OT Assessment / Plan / Recommendation Comments on Treatment Session Pt is feeling better this session but still weak when ambulating.  Pt attempted to don socks but was unable to reach feet or bring legs across.  Pt O2 maintained at 93% on RA.  Pt did complain of feeling weak and dizzy and needing to rest one time.    Follow Up Recommendations  SNF       Equipment Recommendations  None recommended by OT       Frequency Min 2X/week   Plan Discharge plan remains appropriate    Precautions / Restrictions Precautions Precautions: Fall Restrictions Weight Bearing Restrictions: No       ADL  Lower Body Bathing: Simulated;Moderate assistance Where Assessed - Lower Body Bathing: Supported sit to stand Lower Body Dressing: Simulated;Moderate assistance Where Assessed - Lower Body Dressing: Supported sit to Pharmacist, hospital: Performed;Minimal Dentist Method: Sit to Barista: Raised toilet seat with arms (or 3-in-1 over toilet) Toileting - Clothing Manipulation and Hygiene: Performed;Min guard Where Assessed - Glass blower/designer Manipulation and Hygiene: Standing Equipment Used: Gait belt;Rolling walker Transfers/Ambulation Related to ADLs: Pt min A with RW.  Began to get fatigue ambulating from recliner to bathroom.  Needed VCs for hand placement when sitting at toliet and recliner. Needed one VC to stay inside walker when sitting in recliner  ADL Comments: Pt was able to get to bathroom.  Once stood to wipe stated she was dizzy and needed to sit to rest.  After break pt was able to return to recliner.     OT Goals ADL Goals ADL Goal: Lower Body Bathing - Progress: Met ADL Goal: Lower Body Dressing - Progress: Met ADL Goal: Toilet Transfer - Progress:  Progressing toward goals ADL Goal: Toileting - Clothing Manipulation - Progress: Met  Visit Information  Last OT Received On: 01/22/13 Assistance Needed: +1    Subjective Data  Subjective: I felt terrible yesterday but, I feel much better today(when asked how she is feeling)      Cognition  Cognition Arousal/Alertness: Awake/alert Behavior During Therapy: WFL for tasks assessed/performed Overall Cognitive Status: Within Functional Limits for tasks assessed    Mobility  Bed Mobility Details for Bed Mobility Assistance: in recliner upon arrival Transfers Transfers: Sit to Stand;Stand to Sit Sit to Stand: 4: Min assist;With armrests;From chair/3-in-1;With upper extremity assist;From toilet Stand to Sit: 4: Min assist;With armrests;To toilet;To chair/3-in-1;With upper extremity assist Details for Transfer Assistance: VCs for hand placement       Balance Static Sitting Balance Static Sitting - Level of Assistance:  (Supervision) Dynamic Sitting Balance Dynamic Sitting - Balance Support: Feet supported;During functional activity Dynamic Sitting - Level of Assistance:  (supervision) Dynamic Sitting - Comments: Scoot to edge of recliner to don socks   End of Session OT - End of Session Equipment Utilized During Treatment: Gait belt Activity Tolerance: Patient limited by fatigue Patient left: in chair;with call bell/phone within reach Nurse Communication:  (therapeutic pillow)       Mayford Knife, Lowanda Foster 01/22/2013, 5:32 PM

## 2013-01-22 NOTE — Clinical Social Work Placement (Signed)
CSW continuing to monitor patient progress and will facilitate discharge to SNF when medically stable. Patien/family SNF placement preference is [facility].  Genelle Bal, MSW, LCSW [phone]

## 2013-01-22 NOTE — Progress Notes (Signed)
[facility] PROGRESS NOTE  [name]:[id number] DOB: 1929/07/16 DOA: 12/31/2012 PCP: Allean Found, MD BRIEF PATIENT DESCRIPTION: 79 y/o without significant medical problems who developed abdominal pain, nausea, and vomiting 3 days prior to presentation to [facility] ED. Found to have high grade SBO with pneumatosis and portal venous gas. Exploratory Lap early am 01/01/13, found foreign body imbedded in Small Bowel ?Bezoar. Pt. with hypoxia with LLL airspace disease post op  Assessment/Plan: 1) Acute hypoxic respiratory failure> resolved  CXR w/ persist left base atx/ effus & LUL scarring  Plan:  Continue Bronchial hygiene  Continue to follow up CXR, consider CTscan   2) Paroxysmal A fib after surgery >> transiently required amiodarone >> now in sinus rhythm.  Grade 1 diastolic dysfx on Echo 4/05.  Hx of hyperlipidemia.  Hx HTN  HTN  Plan:  Continue norvasc  Continue ASA, lipitor  - Relatively well controlled.  3) Acute on chronic renal failure likely from ATN in setting of shock. HD x3 last 4/14. UOP picking up with lasix  Plan:  Follow UOP and S Cr progress  Renal following - Scr cont to rise but no urgent indication HD at this time. Attempting lasix. Hopeful for recovery.  Will f/u with nephrology's recommendations.  4) Distal SBO s/p laparotomy 4/1 with dx foreign body removal and peritonitis - pathology showed phytobezoar  Dysphagia.  Plan:  Post-op care per [facility]CS  Cont regular diet. Currently tolerating diet.  5) Anemia of critical illness and chronic disease.  Thrombocytopenia of critical illness.  Hx of DVT with Factor V Leiden.  Plan:  Monitor, thrombocytopenia resolved. - Repeat hemoglobin, if decreases below 7.0 will plan on transfusing patient.  6) Septic shock from aspiration pneumonia, and peritonitis >> resolved.  Plan:  Monitor off Abx. Patient has been asymptomatic with no WBC or fevers.  Denies any productive cough or SOB  7) Hyperglycemia >>  no hx of DM.  Plan:  SSI  hgbA1c 4/12 >>>6.2  SSI   8) Deconditioning.  Plan:  PT/OT   Code Status: full Family Communication: none at bedside. Disposition Plan: Pending improvement in kidney function   Consultants:  Nephrology  Pt/Ot   Procedures:  HD  Bipap  Antibiotics:  none  HPI/Subjective: Patient has no new complaints.  Objective: Filed Vitals:   01/21/13 2043 01/22/13 0407 01/22/13 0843 01/22/13 1329  BP: 120/52 123/66 131/67 138/66  Pulse: 75 78 80 80  Temp: 97.9 F (36.6 C) 98.8 F (37.1 C) 98 F (36.7 C) 98.9 F (37.2 C)  TempSrc: Oral Oral    Resp: 18 18 18 18   Height:      Weight: 63.7 kg (140 lb 6.9 oz)     SpO2: 96% 95% 94% 92%    Intake/Output Summary (Last 24 hours) at 01/22/13 1447 Last data filed at 01/22/13 1329  Gross per 24 hour  Intake    700 ml  Output    750 ml  Net    -50 ml   Filed Weights   01/18/13 2121 01/20/13 2025 01/21/13 2043  Weight: 68.4 kg (150 lb 12.7 oz) 68.4 kg (150 lb 12.7 oz) 63.7 kg (140 lb 6.9 oz)    Exam:   General:  Pt in NAD, Alert and Awake  Cardiovascular: S1 and S2 WNL, No MRG  Respiratory: CTA BL, no increased work of breathing  Abdomen: soft, NT, ND  Musculoskeletal: no cyanosis or clubbing   Data Reviewed: Basic Metabolic Panel:  Recent Labs Lab 01/18/13 0415  01/19/13 0400 01/20/13 0500 01/21/13 0500 01/22/13 0545  NA 129* 130* 131* 132* 130*  K 3.8 3.9 3.6 3.7 3.2*  CL 92* 92* 91* 92* 90*  CO2 21 21 20 21 21   GLUCOSE 160* 120* 141* 121* 193*  BUN 72* 79* 87* 90* 100*  CREATININE 6.34* 6.51* 6.95* 6.31* 6.47*  CALCIUM 8.4 8.3* 8.4 8.3* 8.2*  PHOS 9.3* 10.2* 10.4* 10.0* 10.3*   Liver Function Tests:  Recent Labs Lab 01/18/13 0415 01/19/13 0400 01/20/13 0500 01/21/13 0500 01/22/13 0545  ALBUMIN 1.8* 1.8* 2.0* 2.0* 1.9*   No results found for this basename: LIPASE, AMYLASE,  in the last 168 hours No results found for this basename: AMMONIA,  in the last 168  hours CBC:  Recent Labs Lab 01/17/13 0500 01/19/13 0400 01/22/13 0500  WBC 6.6 6.2 4.3  HGB 8.1* 7.6* 7.0*  HCT 22.5* 21.4* 19.5*  MCV 81.2 81.1 80.6  PLT 152 169 166   Cardiac Enzymes: No results found for this basename: CKTOTAL, CKMB, CKMBINDEX, TROPONINI,  in the last 168 hours BNP (last 3 results)  Recent Labs  12/31/12 1940 01/06/13 0500 01/07/13 0830  PROBNP 3133.0* 6137.0* 6067.0*   CBG:  Recent Labs Lab 01/21/13 1119 01/21/13 1645 01/21/13 2047 01/22/13 0802 01/22/13 1137  GLUCAP 169* 105* 140* 135* 129*    No results found for this or any previous visit (from the past 240 hour(s)).   Studies: Dg Chest 2 View  01/22/2013  *RADIOLOGY REPORT*  Clinical Data: Follow-up effusion and atelectasis.  CHEST - 2 VIEW  Comparison: 01/19/2013  Findings: Right central venous line unchanged.  Stable heart silhouette.  There is dense left retrocardiac opacity again demonstrated.  Linear opacity in the left upper lobe is unchanged. No pneumothorax.  IMPRESSION:  1.  No significant change. 2.  Left retrocardiac opacity presenting  pneumonia with small effusion. 3.  Left upper lobe opacity representing atelectasis versus pneumonia.   Original Report Authenticated By: Genevive Bi, M.D.     Scheduled Meds: . amLODipine  10 mg Oral Daily  . antiseptic oral rinse  15 mL Mouth Rinse BID  . aspirin EC  81 mg Oral Daily  . atorvastatin  10 mg Oral q1800  . ciprofloxacin  2 drop Both Eyes Q4H while awake  . feeding supplement  1 Container Oral TID BM  . heparin subcutaneous  5,000 Units Subcutaneous Q8H  . insulin aspart  0-15 Units Subcutaneous TID WC  . sodium chloride  10-40 mL Intracatheter Q12H   Continuous Infusions:   Principal Problem:   SBO (small bowel obstruction) Active Problems:   PNA (pneumonia)   HTN (hypertension)   Hypoxia   Hyponatremia   Dehydration   Acute renal failure   Hyperglycemia   Acute respiratory failure   Hypovolemic shock    Sepsis(995.91)   Acute blood loss anemia    Time spent: >35 minutes    Penny Pia  [facility] Pager [phone]. If 7PM-7AM, please contact night-coverage at [email or web address], password [facility] 01/22/2013, 2:47 PM  LOS: 22 days

## 2013-01-22 NOTE — Progress Notes (Signed)
Physical Therapy Treatment Patient Details Name: [name] MRN: [id number] DOB: 1929-07-27 Today's Date: 01/22/2013 Time: 4098-1191 PT Time Calculation (min): 31 min  PT Assessment / Plan / Recommendation Comments on Treatment Session  Pt mobility continues to improve.  Pt c/o numbness and pain in her bottom from sitting.  Pt may benefit from [place] pressure relief cushion to prevent skin breakdown.      Follow Up Recommendations  SNF     Does the patient have the potential to tolerate intense rehabilitation     Barriers to Discharge        Equipment Recommendations  Rolling walker with 5" wheels    Recommendations for Other Services    Frequency Min 3X/week   Plan Frequency remains appropriate;Discharge plan remains appropriate    Precautions / Restrictions Precautions Precautions: Fall Restrictions Weight Bearing Restrictions: No   Pertinent Vitals/Pain C/o pain and numbness in her buttocks.  Notified RN.  RN to check pt's buttocks for redness and skin integrity.     Mobility  Bed Mobility Bed Mobility: Sit to Supine Supine to Sit: Not tested (comment) Sitting - Scoot to Edge of Bed: Not tested (comment) Sit to Supine: 3: Mod assist;HOB flat Details for Bed Mobility Assistance: Assist for bilateral LEs Pt falls into bed with trunk Transfers Transfers: Sit to Stand;Stand to Sit Sit to Stand: 4: Min assist;With armrests;From chair/3-in-1;With upper extremity assist;From toilet Sit to Stand: Patient Percentage: 80% Stand to Sit: 4: Min assist;With armrests;To toilet;To chair/3-in-1;With upper extremity assist Stand to Sit: Patient Percentage: 80% Stand Pivot Transfers: Not tested (comment) Details for Transfer Assistance: VCs for hand placement Ambulation/Gait Ambulation/Gait Assistance: 4: Min assist Ambulation/Gait: Patient Percentage: 90% Ambulation Distance (Feet): 70 Feet (50 feet first trial 70 feet second trial.  ) Assistive device: Rolling  walker Ambulation/Gait Assistance Details: Pt ambulated 50 feet first trial with vcs to increase distance from RW and increase gait speed. Pt rested for 6 minutes in recliner than ambulated another 70 feet with improved gait speed.   Gait Pattern: Step-through pattern;Decreased stride length;Trunk rotated posteriorly on left;Trunk flexed Gait velocity: decreased 12 seconds to ambulate 10 feet.   General Gait Details: Fatigue primary limiting factor.  Stairs: No Wheelchair Mobility Wheelchair Mobility: No    Exercises     PT Diagnosis:    PT Problem List:   PT Treatment Interventions:     PT Goals Acute Rehab PT Goals PT Goal Formulation: With patient Time For Goal Achievement: 01/16/13 Potential to Achieve Goals: Fair Pt will go Supine/Side to Sit: with modified independence Pt will go Sit to Supine/Side: with modified independence PT Goal: Sit to Supine/Side - Progress: Progressing toward goal Pt will go Sit to Stand: with modified independence PT Goal: Sit to Stand - Progress: Progressing toward goal Pt will go Stand to Sit: with modified independence PT Goal: Stand to Sit - Progress: Progressing toward goal Pt will Ambulate: 16 - 50 feet;with rolling walker PT Goal: Ambulate - Progress: Progressing toward goal  Visit Information  Last PT Received On: 01/22/13 Assistance Needed: +1    Subjective Data      Cognition  Cognition Arousal/Alertness: Awake/alert Behavior During Therapy: WFL for tasks assessed/performed Overall Cognitive Status: Within Functional Limits for tasks assessed    Balance  Balance Balance Assessed: No Static Sitting Balance Static Sitting - Level of Assistance:  (Supervision) Dynamic Sitting Balance Dynamic Sitting - Balance Support: Feet supported;During functional activity Dynamic Sitting - Level of Assistance:  (supervision) Dynamic Sitting -  Comments: Scoot to edge of recliner to don socks  End of Session PT - End of Session Equipment  Utilized During Treatment: Gait belt Activity Tolerance: Patient tolerated treatment well Patient left: in bed;with call bell/phone within reach;with bed alarm set Nurse Communication: Mobility status   GP     Mykal Batiz 01/22/2013, 6:35 PM Kellin Bartling L. Jaylee Freeze DPT [phone]

## 2013-01-22 NOTE — Progress Notes (Signed)
Subjective: Interval History: has complaints bad day yest, with stomach.  Objective: Vital signs in last 24 hours: Temp:  [97.5 F (36.4 C)-98.8 F (37.1 C)] 98 F (36.7 C) (04/22 0843) Pulse Rate:  [69-87] 80 (04/22 0843) Resp:  [17-19] 18 (04/22 0843) BP: (120-145)/(52-67) 131/67 mmHg (04/22 0843) SpO2:  [90 %-96 %] 94 % (04/22 0843) Weight:  [63.7 kg (140 lb 6.9 oz)] 63.7 kg (140 lb 6.9 oz) (04/21 2043) Weight change: -4.7 kg (-10 lb 5.8 oz)  Intake/Output from previous day: 04/21 0701 - 04/22 0700 In: 700 [P.O.:700] Out: 450 [Urine:450] Intake/Output this shift: Total I/O In: 120 [P.O.:120] Out: -   General appearance: alert, cooperative and pale Resp: diminished breath sounds bilaterally and rales bibasilar Cardio: S1, S2 normal and systolic murmur: holosystolic 2/6, blowing at apex GI: mod distension, ^ tympany, pos bs,erratic Extremities: edema 3+  Lab Results:  Recent Labs  01/22/13 0500  WBC 4.3  HGB 7.0*  HCT 19.5*  PLT 166   BMET:  Recent Labs  01/21/13 0500 01/22/13 0545  NA 132* 130*  K 3.7 3.2*  CL 92* 90*  CO2 21 21  GLUCOSE 121* 193*  BUN 90* 100*  CREATININE 6.31* 6.47*  CALCIUM 8.3* 8.2*   No results found for this basename: PTH,  in the last 72 hours Iron Studies:  Recent Labs  01/21/13 1541  IRON 30*  TIBC 181*    Studies/Results: Dg Chest 2 View  01/22/2013  *RADIOLOGY REPORT*  Clinical Data: Follow-up effusion and atelectasis.  CHEST - 2 VIEW  Comparison: 01/19/2013  Findings: Right central venous line unchanged.  Stable heart silhouette.  There is dense left retrocardiac opacity again demonstrated.  Linear opacity in the left upper lobe is unchanged. No pneumothorax.  IMPRESSION:  1.  No significant change. 2.  Left retrocardiac opacity presenting  pneumonia with small effusion. 3.  Left upper lobe opacity representing atelectasis versus pneumonia.   Original Report Authenticated By: Genevive Bi, M.D.     I have reviewed  the patient's current medications.  Assessment/Plan: 1 AKI not better, ? Urine vol.  Mild acidemia, , vol xs, low K . 2 anemia give fe 3 SBO  Bezoar per surgery 4 Pneu better 5 Nutrition poor P K, follow chem, po suppl    LOS: 22 days   Mattilyn Crites L 01/22/2013,10:10 AM

## 2013-01-23 LAB — CBC
HCT: 24 % — ABNORMAL LOW (ref 36.0–46.0)
MCHC: 35.8 g/dL (ref 30.0–36.0)
MCV: 80 fL (ref 78.0–100.0)
RDW: 14.1 % (ref 11.5–15.5)

## 2013-01-23 LAB — RENAL FUNCTION PANEL
BUN: 101 mg/dL — ABNORMAL HIGH (ref 6–23)
CO2: 19 mEq/L (ref 19–32)
Chloride: 92 mEq/L — ABNORMAL LOW (ref 96–112)
Creatinine, Ser: 5.98 mg/dL — ABNORMAL HIGH (ref 0.50–1.10)
GFR calc non Af Amer: 6 mL/min — ABNORMAL LOW (ref >90)
Potassium: 4.1 mEq/L (ref 3.5–5.1)

## 2013-01-23 LAB — GLUCOSE, CAPILLARY
Glucose-Capillary: 132 mg/dL — ABNORMAL HIGH (ref 70–99)
Glucose-Capillary: 171 mg/dL — ABNORMAL HIGH (ref 70–99)

## 2013-01-23 NOTE — Progress Notes (Signed)
[facility] PROGRESS NOTE  [name] YNW:[id number] DOB: 10-21-1928 DOA: 12/31/2012 PCP: Allean Found, MD BRIEF PATIENT DESCRIPTION: 79 y/o without significant medical problems who developed abdominal pain, nausea, and vomiting 3 days prior to presentation to [facility] ED. Found to have high grade SBO with pneumatosis and portal venous gas. Exploratory Lap early am 01/01/13, found foreign body imbedded in Small Bowel ?Bezoar. Pt. with hypoxia with LLL airspace disease post op  Assessment/Plan: 1) Acute hypoxic respiratory failure> resolved  CXR w/ persist left base atx/ effus & LUL scarring  Plan:  Continue Bronchial hygiene  FU CXR in 3-4 weeks to ensure resolution  2) Paroxysmal A fib after surgery >> transiently required amiodarone >> now in sinus rhythm.  Grade 1 diastolic dysfx on Echo 4/05.  Hx of hyperlipidemia.  Plan:  Continue norvasc  Continue ASA, lipitor  - Relatively well controlled.  3) Acute on chronic renal failure likely from ATN in setting of shock. HD x3 last 4/14. UOP picking up with lasix  Plan:  Per renal Follow UOP and S Creatinine  Will f/u with nephrology's recommendations.  4) Distal SBO s/p laparotomy 4/1 with dx foreign body removal and peritonitis - pathology showed phytobezoar  Dysphagia.  Plan:  Post-op care per CCS  Cont regular diet. Currently tolerating diet.  5) Anemia of critical illness and chronic disease.  Thrombocytopenia of critical illness.  Hx of DVT with Factor V Leiden.  Plan:  Monitor, thrombocytopenia resolved. -transfused 1 unit PRBC 4/22  6) Septic shock from aspiration pneumonia, and peritonitis >> resolved.  Plan:  Monitor off Abx. Patient has been asymptomatic with no WBC or fevers.   -completed abx course  7) Hyperglycemia >> no hx of DM/ borderline DM Plan:  SSI  hgbA1c 4/12 >>>6.2   8) Deconditioning.  Plan:  PT/OT   Code Status: full Family Communication: none at bedside. Disposition Plan: SNF  Pending improvement in kidney function   Consultants:  Nephrology  Pt/Ot   Procedures:  HD  Bipap  Antibiotics:  none  HPI/Subjective: Nausea today, felt ok yesterday  Objective: Filed Vitals:   01/22/13 1700 01/22/13 2041 01/23/13 0443 01/23/13 0800  BP: 132/58 116/68 145/75 124/69  Pulse: 81 71 88 90  Temp: 98.5 F (36.9 C) 97.2 F (36.2 C) 97.7 F (36.5 C) 97.5 F (36.4 C)  TempSrc: Oral Oral Oral Oral  Resp: 18 18 18 18   Height:  5\' 3"  (1.6 m)    Weight:  63.699 kg (140 lb 6.9 oz)    SpO2: 99% 95% 95% 96%    Intake/Output Summary (Last 24 hours) at 01/23/13 0951 Last data filed at 01/23/13 0945  Gross per 24 hour  Intake    960 ml  Output    420 ml  Net    540 ml   Filed Weights   01/20/13 2025 01/21/13 2043 01/22/13 2041  Weight: 68.4 kg (150 lb 12.7 oz) 63.7 kg (140 lb 6.9 oz) 63.699 kg (140 lb 6.9 oz)    Exam:   General:  Pt in NAD, Alert and Awake  Cardiovascular: S1 and S2 WNL, No MRG  Respiratory: CTA BL, no increased work of breathing  Abdomen: soft, NT, ND  Musculoskeletal: no cyanosis or clubbing   Data Reviewed: Basic Metabolic Panel:  Recent Labs Lab 01/19/13 0400 01/20/13 0500 01/21/13 0500 01/22/13 0545 01/23/13 0500  NA 130* 131* 132* 130* 131*  K 3.9 3.6 3.7 3.2* 4.1  CL 92* 91* 92* 90* 92*  CO2  21 20 21 21 19   GLUCOSE 120* 141* 121* 193* 135*  BUN 79* 87* 90* 100* 101*  CREATININE 6.51* 6.95* 6.31* 6.47* 5.98*  CALCIUM 8.3* 8.4 8.3* 8.2* 8.8  MG  --   --   --   --  1.6  PHOS 10.2* 10.4* 10.0* 10.3* 9.3*   Liver Function Tests:  Recent Labs Lab 01/19/13 0400 01/20/13 0500 01/21/13 0500 01/22/13 0545 01/23/13 0500  ALBUMIN 1.8* 2.0* 2.0* 1.9* 2.3*   No results found for this basename: LIPASE, AMYLASE,  in the last 168 hours No results found for this basename: AMMONIA,  in the last 168 hours CBC:  Recent Labs Lab 01/17/13 0500 01/19/13 0400 01/22/13 0500 01/23/13 0500  WBC 6.6 6.2 4.3 7.1  HGB  8.1* 7.6* 7.0* 8.6*  HCT 22.5* 21.4* 19.5* 24.0*  MCV 81.2 81.1 80.6 80.0  PLT 152 169 166 196   Cardiac Enzymes: No results found for this basename: CKTOTAL, CKMB, CKMBINDEX, TROPONINI,  in the last 168 hours BNP (last 3 results)  Recent Labs  12/31/12 1940 01/06/13 0500 01/07/13 0830  PROBNP 3133.0* 6137.0* 6067.0*   CBG:  Recent Labs Lab 01/22/13 0802 01/22/13 1137 01/22/13 1720 01/22/13 2045 01/23/13 0735  GLUCAP 135* 129* 189* 102* 132*    No results found for this or any previous visit (from the past 240 hour(s)).   Studies: Dg Chest 2 View  01/22/2013  *RADIOLOGY REPORT*  Clinical Data: Follow-up effusion and atelectasis.  CHEST - 2 VIEW  Comparison: 01/19/2013  Findings: Right central venous line unchanged.  Stable heart silhouette.  There is dense left retrocardiac opacity again demonstrated.  Linear opacity in the left upper lobe is unchanged. No pneumothorax.  IMPRESSION:  1.  No significant change. 2.  Left retrocardiac opacity presenting  pneumonia with small effusion. 3.  Left upper lobe opacity representing atelectasis versus pneumonia.   Original Report Authenticated By: Genevive Bi, M.D.     Scheduled Meds: . amLODipine  10 mg Oral Daily  . antiseptic oral rinse  15 mL Mouth Rinse BID  . aspirin EC  81 mg Oral Daily  . atorvastatin  10 mg Oral q1800  . ciprofloxacin  2 drop Both Eyes Q4H while awake  . feeding supplement  1 Container Oral TID BM  . heparin subcutaneous  5,000 Units Subcutaneous Q8H  . insulin aspart  0-15 Units Subcutaneous TID WC  . sodium chloride  10-40 mL Intracatheter Q12H   Continuous Infusions:   Principal Problem:   SBO (small bowel obstruction) Active Problems:   PNA (pneumonia)   HTN (hypertension)   Hypoxia   Hyponatremia   Dehydration   Acute renal failure   Hyperglycemia   Acute respiratory failure   Hypovolemic shock   Sepsis(995.91)   Acute blood loss anemia    Time spent: >35  minutes    [facility]  [facility] Pager [phone]. If 7PM-7AM, please contact night-coverage at [email or web address], password [facility] 01/23/2013, 9:51 AM  LOS: 23 days

## 2013-01-23 NOTE — Progress Notes (Signed)
NUTRITION FOLLOW UP  Intervention:    Encouraged Resource Breeze po TID, each supplement provides 250 kcal and 9 grams of protein  RD to continue to follow nutrition care plan  Nutrition Dx:   Inadequate oral intake now related to variable appetite evidenced by variable meal intake. Ongoing.  Goal:   Intake to meet >90% of estimated nutrition needs, improving.  Monitor:   PO intake, labs, weight trend.  Assessment:   Exp-lap 4/1. Noted foreign body imbedded in small bowel. Extubated 4/4. HD completed 4/8, 4/9, 4/10, 4/14.   AKI slowly improving per renal. Plan to remove catheter when Cr < 5.  Pt reports oral intake remains variable. She notes that she is drinking at least 2 [employer] supplements daily. Potassium WNL however phosphorus at 9.3. Remains on Renal restrictions with Regular diet - previously on Dysphagia 2 diet; she denies that she has any issues with current textures.  Height: Ht Readings from Last 1 Encounters:  01/22/13 5\' 3"  (1.6 m)    Weight Status:   Wt Readings from Last 1 Encounters:  01/22/13 140 lb 6.9 oz (63.699 kg)  Wt consistent with admit wt of 141 lb.  Re-estimated needs:  Kcal: 1500 - 1700 Protein: 65-75 gm Fluid: 1.2 L  Skin: abdominal incision  Diet Order: Renal 60-70; 1200 ml fluid   Intake/Output Summary (Last 24 hours) at 01/23/13 1055 Last data filed at 01/23/13 0945  Gross per 24 hour  Intake    960 ml  Output    420 ml  Net    540 ml    Last BM: 4/21   Labs:   Recent Labs Lab 01/21/13 0500 01/22/13 0545 01/23/13 0500  NA 132* 130* 131*  K 3.7 3.2* 4.1  CL 92* 90* 92*  CO2 21 21 19   BUN 90* 100* 101*  CREATININE 6.31* 6.47* 5.98*  CALCIUM 8.3* 8.2* 8.8  MG  --   --  1.6  PHOS 10.0* 10.3* 9.3*  GLUCOSE 121* 193* 135*    CBG (last 3)   Recent Labs  01/22/13 1720 01/22/13 2045 01/23/13 0735  GLUCAP 189* 102* 132*    Scheduled Meds: . amLODipine  10 mg Oral Daily  . antiseptic oral rinse  15 mL  Mouth Rinse BID  . aspirin EC  81 mg Oral Daily  . atorvastatin  10 mg Oral q1800  . ciprofloxacin  2 drop Both Eyes Q4H while awake  . feeding supplement  1 Container Oral TID BM  . heparin subcutaneous  5,000 Units Subcutaneous Q8H  . insulin aspart  0-15 Units Subcutaneous TID WC  . sodium chloride  10-40 mL Intracatheter Q12H    Continuous Infusions:  none  Jarold Motto MS, RD, LDN Pager: [phone] After-hours pager: [phone]

## 2013-01-23 NOTE — Progress Notes (Signed)
I have reviewed and agree with the note.  Ignacia Palma, [place] [phone] 01/23/2013

## 2013-01-23 NOTE — Progress Notes (Signed)
Physical Therapy Treatment Patient Details Name: [name] MRN: [id number] DOB: [birth year] Today's Date: 01/23/2013 Time: 1478-2956 PT Time Calculation (min): 24 min  PT Assessment / Plan / Recommendation Comments on Treatment Session  Placed [name] cushion into pt chair.  Will assess effectivness later today.  Pt mobility and activity tolerance continue to improve.  Pt may now be a better candidate for CIR.       Follow Up Recommendations  SNF     Does the patient have the potential to tolerate intense rehabilitation   YES  Barriers to Discharge        Equipment Recommendations  Rolling walker with 5" wheels    Recommendations for Other Services OT consult  Frequency Min 3X/week   Plan Frequency remains appropriate;Discharge plan remains appropriate    Precautions / Restrictions Precautions Precautions: Fall   Pertinent Vitals/Pain No pain.     Mobility  Bed Mobility Bed Mobility: Supine to Sit;Sit to Supine Supine to Sit: 4: Min assist;HOB elevated Supine to Sit: Patient Percentage: 80% Sitting - Scoot to Edge of Bed: 3: Mod assist Sit to Supine: 4: Min assist;HOB flat Details for Bed Mobility Assistance: Assist to raise shoulders from bed secondary to core and UE weakness. Assisted pt to scoot to EOB using lift pad.   Transfers Transfers: Sit to Stand;Stand to Sit Sit to Stand: 4: Min assist;With armrests;From chair/3-in-1;With upper extremity assist;From toilet Sit to Stand: Patient Percentage: 80% Stand to Sit: 4: Min assist;With armrests;To toilet;To chair/3-in-1;With upper extremity assist Stand to Sit: Patient Percentage: 80% Stand Pivot Transfers: 4: Min assist Stand Pivot Transfers: Patient Percentage: 80% Details for Transfer Assistance: VC and tactile cues for hand placement, instructed pt to scoot to edge of chair to stand and sit on Edge of chair when sitting for increased quad control Ambulation/Gait Ambulation/Gait Assistance: 4: Min  guard Ambulation Distance (Feet): 70 Feet (40 feet first trial 70 feet second trial.) Assistive device: Rolling walker Ambulation/Gait Assistance Details: VCs to increase gait speed.  Pt rested approximately 5 minutes between trials.   Gait Pattern: Step-through pattern;Decreased stride length;Trunk rotated posteriorly on left;Trunk flexed General Gait Details: Fatigue primary limiting factor.  Stairs: No    Exercises     PT Diagnosis:    PT Problem List:   PT Treatment Interventions:     PT Goals Acute Rehab PT Goals PT Goal Formulation: With patient Time For Goal Achievement: 01/16/13 Potential to Achieve Goals: Fair Pt will go Supine/Side to Sit: with modified independence PT Goal: Supine/Side to Sit - Progress: Progressing toward goal Pt will go Sit to Supine/Side: with modified independence PT Goal: Sit to Supine/Side - Progress: Progressing toward goal Pt will go Sit to Stand: with modified independence PT Goal: Sit to Stand - Progress: Progressing toward goal Pt will go Stand to Sit: with modified independence PT Goal: Stand to Sit - Progress: Progressing toward goal Pt will Ambulate: with rolling walker;with modified independence;51 - 150 feet PT Goal: Ambulate - Progress: Updated due to goal met  Visit Information  Last PT Received On: 01/23/13    Subjective Data  Subjective: I need a cushion for my bottom Patient Stated Goal: wants to go to rehab   Cognition  Cognition Arousal/Alertness: Awake/alert Behavior During Therapy: WFL for tasks assessed/performed Overall Cognitive Status: Within Functional Limits for tasks assessed    Balance  Balance Balance Assessed: No  End of Session PT - End of Session Equipment Utilized During Treatment: Gait belt Activity Tolerance: Patient tolerated  treatment well Patient left: in bed;with call bell/phone within reach;with bed alarm set Nurse Communication: Mobility status   GP     [name] 01/23/2013, 12:41  PM [name] L. [name] DPT [phone]

## 2013-01-23 NOTE — Progress Notes (Signed)
Subjective: Interval History: has complaints N this am.  Objective: Vital signs in last 24 hours: Temp:  [97.2 F (36.2 C)-98.9 F (37.2 C)] 97.5 F (36.4 C) (04/23 0800) Pulse Rate:  [71-90] 90 (04/23 0800) Resp:  [18] 18 (04/23 0800) BP: (116-145)/(58-75) 124/69 mmHg (04/23 0800) SpO2:  [92 %-99 %] 96 % (04/23 0800) Weight:  [63.699 kg (140 lb 6.9 oz)] 63.699 kg (140 lb 6.9 oz) (04/22 2041) Weight change: -0.001 kg (-0 oz)  Intake/Output from previous day: 04/22 0701 - 04/23 0700 In: 720 [P.O.:720] Out: 420 [Urine:420] Intake/Output this shift: Total I/O In: 360 [P.O.:360] Out: 300 [Urine:300]  General appearance: alert and pale Neck: RIJ temp cath Resp: diminished breath sounds bilaterally and rales bibasilar Cardio: S1, S2 normal and systolic murmur: holosystolic 2/6, blowing at apex GI: pos bs, mod distension, ^ tympany Extremities: edema Tr  Lab Results:  Recent Labs  01/22/13 0500 01/23/13 0500  WBC 4.3 7.1  HGB 7.0* 8.6*  HCT 19.5* 24.0*  PLT 166 196   BMET:  Recent Labs  01/22/13 0545 01/23/13 0500  NA 130* 131*  K 3.2* 4.1  CL 90* 92*  CO2 21 19  GLUCOSE 193* 135*  BUN 100* 101*  CREATININE 6.47* 5.98*  CALCIUM 8.2* 8.8   No results found for this basename: PTH,  in the last 72 hours Iron Studies:  Recent Labs  01/21/13 1541  IRON 30*  TIBC 181*    Studies/Results: Dg Chest 2 View  01/22/2013  *RADIOLOGY REPORT*  Clinical Data: Follow-up effusion and atelectasis.  CHEST - 2 VIEW  Comparison: 01/19/2013  Findings: Right central venous line unchanged.  Stable heart silhouette.  There is dense left retrocardiac opacity again demonstrated.  Linear opacity in the left upper lobe is unchanged. No pneumothorax.  IMPRESSION:  1.  No significant change. 2.  Left retrocardiac opacity presenting  pneumonia with small effusion. 3.  Left upper lobe opacity representing atelectasis versus pneumonia.   Original Report Authenticated By: Genevive Bi,  M.D.     I have reviewed the patient's current medications.  Assessment/Plan: 1 AKI slowly better. Vol ok,mild acidemia.  When Cr < 5 pull cath. 2 Anemia improved 3 S/P SBO stable. Some intermittent N. 4 HTN Stable P Follow Cr, vol , hopefully get cath out soon    LOS: 23 days   Hadriel Northup L 01/23/2013,10:21 AM

## 2013-01-23 NOTE — Progress Notes (Signed)
Physical Therapy Treatment Patient Details Name: [name] MRN: [id number] DOB: 1929/09/29 Today's Date: 01/23/2013 Time: 5621-3086 PT Time Calculation (min): 24 min  PT Assessment / Plan / Recommendation Comments on Treatment Session  Returned pt to bed at end of session secondary to c/o pain in numbness in bottocks. Aquired Rhoho Cushion from in-pt rehab for pt to try but pt had stryder cushion delivered while PT was acquiring cushion.  Will try pt on Stryder cushion later this morning.      Follow Up Recommendations  SNF     Does the patient have the potential to tolerate intense rehabilitation     Barriers to Discharge        Equipment Recommendations  Rolling walker with 5" wheels    Recommendations for Other Services OT consult  Frequency Min 3X/week   Plan Frequency remains appropriate;Discharge plan remains appropriate    Precautions / Restrictions Precautions Precautions: Fall Restrictions Weight Bearing Restrictions: No   Pertinent Vitals/Pain No c/o pain.     Mobility  Bed Mobility Bed Mobility: Supine to Sit;Sit to Supine Supine to Sit: 4: Min assist;HOB elevated Supine to Sit: Patient Percentage: 80% Sitting - Scoot to Edge of Bed: 3: Mod assist Sit to Supine: 4: Min assist;HOB flat Details for Bed Mobility Assistance: Assist to raise shoulders from bed secondary to core and UE weakness. Assisted pt to scoot to EOB using lift pad.   Transfers Transfers: Sit to Stand;Stand to Sit Sit to Stand: 4: Min assist;With armrests;From chair/3-in-1;With upper extremity assist;From toilet Sit to Stand: Patient Percentage: 80% Stand to Sit: 4: Min assist;With armrests;To toilet;To chair/3-in-1;With upper extremity assist Stand to Sit: Patient Percentage: 80% Stand Pivot Transfers: 4: Min assist Stand Pivot Transfers: Patient Percentage: 80% Details for Transfer Assistance: VC and tactile cues for hand placement, instructed pt to scoot to edge of chair to stand  and sit on Edge of chair when sitting for increased quad control Ambulation/Gait Ambulation Distance (Feet):  (50 feet first trial 70 feet second trial)    Exercises     PT Diagnosis:    PT Problem List:   PT Treatment Interventions:     PT Goals Acute Rehab PT Goals PT Goal Formulation: With patient Time For Goal Achievement: 01/16/13 Potential to Achieve Goals: Fair Pt will go Supine/Side to Sit: with modified independence PT Goal: Supine/Side to Sit - Progress: Progressing toward goal Pt will go Sit to Supine/Side: with modified independence PT Goal: Sit to Supine/Side - Progress: Progressing toward goal Pt will go Sit to Stand: with modified independence PT Goal: Sit to Stand - Progress: Progressing toward goal Pt will go Stand to Sit: with modified independence PT Goal: Stand to Sit - Progress: Progressing toward goal  Visit Information  Last PT Received On: 01/23/13 Assistance Needed: +1    Subjective Data  Subjective: I need a cushion for my bottom Patient Stated Goal: wants to go to rehab   Cognition  Cognition Arousal/Alertness: Awake/alert Behavior During Therapy: WFL for tasks assessed/performed Overall Cognitive Status: Within Functional Limits for tasks assessed    Balance  Balance Balance Assessed: No  End of Session PT - End of Session Equipment Utilized During Treatment: Gait belt Activity Tolerance: Patient tolerated treatment well Patient left: in bed;with call bell/phone within reach;with bed alarm set Nurse Communication: Mobility status   GP     Twala Collings 01/23/2013, 12:05 PM Yasaman Kolek L. Nathanel Tallman DPT [phone]

## 2013-01-24 LAB — RENAL FUNCTION PANEL
Albumin: 2.3 g/dL — ABNORMAL LOW (ref 3.5–5.2)
CO2: 19 mEq/L (ref 19–32)
Calcium: 9.1 mg/dL (ref 8.4–10.5)
Creatinine, Ser: 5.75 mg/dL — ABNORMAL HIGH (ref 0.50–1.10)
GFR calc Af Amer: 7 mL/min — ABNORMAL LOW (ref >90)
GFR calc non Af Amer: 6 mL/min — ABNORMAL LOW (ref >90)
Phosphorus: 9.1 mg/dL — ABNORMAL HIGH (ref 2.3–4.6)
Sodium: 134 mEq/L — ABNORMAL LOW (ref 135–145)

## 2013-01-24 LAB — GLUCOSE, CAPILLARY
Glucose-Capillary: >600 mg/dL (ref 70–99)
Glucose-Capillary: 125 mg/dL — ABNORMAL HIGH (ref 70–99)

## 2013-01-24 LAB — CBC
MCH: 28.3 pg (ref 26.0–34.0)
Platelets: 186 10*3/uL (ref 150–400)
RBC: 2.83 MIL/uL — ABNORMAL LOW (ref 3.87–5.11)
RDW: 13.9 % (ref 11.5–15.5)

## 2013-01-24 NOTE — Progress Notes (Signed)
[facility] PROGRESS NOTE  [name] WUJ:[id number] DOB: [birth year] DOA: 12/31/2012 PCP: Allean Found, MD BRIEF PATIENT DESCRIPTION: 79 y/o without significant medical problems who developed abdominal pain, nausea, and vomiting 3 days prior to presentation to [facility] ED. Found to have high grade SBO with pneumatosis and portal venous gas. Exploratory Lap early am 01/01/13, found foreign body imbedded in Small Bowel ?Bezoar. Pt. with hypoxia with LLL airspace disease post op  Assessment/Plan: 1) Acute hypoxic respiratory failure> resolved  CXR w/ persist left base atx/ effus & LUL scarring  Plan:  Continue Bronchial hygiene  FU CXR in 3-4 weeks to ensure resolution  2) Paroxysmal A fib after surgery >> transiently required amiodarone >> now in sinus rhythm.  Grade 1 diastolic dysfx on Echo 4/05.  Hx of hyperlipidemia.  Plan:  Continue norvasc  Continue ASA, lipitor  - Relatively well controlled.  3) Acute on chronic renal failure likely from ATN in setting of shock. HD x3 last 4/14.   Plan:  Per renal UOP good and S Creatinine trending down but slowly Plan to remove catheter tomorrow Will f/u with nephrology's recommendations.  4) Distal SBO s/p laparotomy 4/1 with dx foreign body removal and peritonitis - pathology showed phytobezoar  Dysphagia.  Plan:  Post-op care per CCS  Cont regular diet. Currently tolerating diet.  5) Anemia of critical illness and chronic disease.  Thrombocytopenia of critical illness.  Hx of DVT with Factor V Leiden.  Plan:  -Monitor, thrombocytopenia resolved. -transfused 1 unit PRBC 4/22  6) Septic shock from aspiration pneumonia, and peritonitis >> resolved.  Plan:  -Monitor off Abx. Patient has been asymptomatic with no WBC or fevers.   -completed abx course  7) Hyperglycemia >> no hx of DM/ borderline DM Plan:  SSI  hgbA1c 4/12 >>>6.2   8) Deconditioning.  Plan:  PT/OT   Code Status: full Family Communication: none at  bedside. Disposition Plan: SNF tomorrow if stable   Consultants:  Nephrology  Pt/Ot   Procedures:  HD  Bipap  Antibiotics:  none  HPI/Subjective: Doing well, no complaints  Objective: Filed Vitals:   01/23/13 2103 01/24/13 0358 01/24/13 0900 01/24/13 1428  BP: 130/74 140/71 142/58 121/67  Pulse: 77 86 96 97  Temp: 97.7 F (36.5 C) 98.2 F (36.8 C) 98.5 F (36.9 C) 98.6 F (37 C)  TempSrc: Oral Oral Oral Oral  Resp: 20 20 18 18   Height: 5\' 3"  (1.6 m)     Weight: 63.699 kg (140 lb 6.9 oz)     SpO2: 96% 96% 100% 100%    Intake/Output Summary (Last 24 hours) at 01/24/13 1442 Last data filed at 01/24/13 1300  Gross per 24 hour  Intake   1080 ml  Output   1293 ml  Net   -213 ml   Filed Weights   01/21/13 2043 01/22/13 2041 01/23/13 2103  Weight: 63.7 kg (140 lb 6.9 oz) 63.699 kg (140 lb 6.9 oz) 63.699 kg (140 lb 6.9 oz)    Exam:   General:  Pt in NAD, Alert and Awake  Cardiovascular: S1 and S2 WNL, No MRG  Respiratory: CTA BL, no increased work of breathing  Abdomen: soft, NT, ND  Musculoskeletal: no cyanosis or clubbing   Data Reviewed: Basic Metabolic Panel:  Recent Labs Lab 01/20/13 0500 01/21/13 0500 01/22/13 0545 01/23/13 0500 01/24/13 0356  NA 131* 132* 130* 131* 134*  K 3.6 3.7 3.2* 4.1 3.9  CL 91* 92* 90* 92* 96  CO2 20 21  21 19 19   GLUCOSE 141* 121* 193* 135* 127*  BUN 87* 90* 100* 101* 106*  CREATININE 6.95* 6.31* 6.47* 5.98* 5.75*  CALCIUM 8.4 8.3* 8.2* 8.8 9.1  MG  --   --   --  1.6  --   PHOS 10.4* 10.0* 10.3* 9.3* 9.1*   Liver Function Tests:  Recent Labs Lab 01/20/13 0500 01/21/13 0500 01/22/13 0545 01/23/13 0500 01/24/13 0356  ALBUMIN 2.0* 2.0* 1.9* 2.3* 2.3*   No results found for this basename: LIPASE, AMYLASE,  in the last 168 hours No results found for this basename: AMMONIA,  in the last 168 hours CBC:  Recent Labs Lab 01/19/13 0400 01/22/13 0500 01/23/13 0500 01/24/13 0356  WBC 6.2 4.3 7.1 6.9   HGB 7.6* 7.0* 8.6* 8.0*  HCT 21.4* 19.5* 24.0* 22.9*  MCV 81.1 80.6 80.0 80.9  PLT 169 166 196 186   Cardiac Enzymes: No results found for this basename: CKTOTAL, CKMB, CKMBINDEX, TROPONINI,  in the last 168 hours BNP (last 3 results)  Recent Labs  12/31/12 1940 01/06/13 0500 01/07/13 0830  PROBNP 3133.0* 6137.0* 6067.0*   CBG:  Recent Labs Lab 01/23/13 1158 01/23/13 1645 01/23/13 2106 01/24/13 0734 01/24/13 1318  GLUCAP 171* 101* 131* 114* >600*    No results found for this or any previous visit (from the past 240 hour(s)).   Studies: No results found.  Scheduled Meds: . amLODipine  10 mg Oral Daily  . antiseptic oral rinse  15 mL Mouth Rinse BID  . aspirin EC  81 mg Oral Daily  . atorvastatin  10 mg Oral q1800  . ciprofloxacin  2 drop Both Eyes Q4H while awake  . feeding supplement  1 Container Oral TID BM  . heparin subcutaneous  5,000 Units Subcutaneous Q8H  . insulin aspart  0-15 Units Subcutaneous TID WC  . sodium chloride  10-40 mL Intracatheter Q12H   Continuous Infusions:   Principal Problem:   SBO (small bowel obstruction) Active Problems:   PNA (pneumonia)   HTN (hypertension)   Hypoxia   Hyponatremia   Dehydration   Acute renal failure   Hyperglycemia   Acute respiratory failure   Hypovolemic shock   Sepsis(995.91)   Acute blood loss anemia    Time spent: >35 minutes    [facility]  [facility] Pager [phone]. If 7PM-7AM, please contact night-coverage at [email or web address], password [facility] 01/24/2013, 2:42 PM  LOS: 24 days

## 2013-01-24 NOTE — Progress Notes (Signed)
Physical Therapy Treatment Patient Details Name: [name] MRN: [id number] DOB: Feb 14, 1929 Today's Date: 01/24/2013 Time: 2130-8657 PT Time Calculation (min): 28 min  PT Assessment / Plan / Recommendation Comments on Treatment Session  Pt reports Stryder cushion was helpful. Mobility and activity tolerance improving. Pt expressed frustration with nursing for not assisting her with ambulation.       Follow Up Recommendations  CIR     Does the patient have the potential to tolerate intense rehabilitation     Barriers to Discharge        Equipment Recommendations  Rolling walker with 5" wheels    Recommendations for Other Services    Frequency Min 3X/week   Plan Frequency remains appropriate;Discharge plan remains appropriate    Precautions / Restrictions Precautions Precautions: Fall Restrictions Weight Bearing Restrictions: No   Pertinent Vitals/Pain No pain.      Mobility  Bed Mobility Bed Mobility: Not assessed Transfers Transfers: Sit to Stand;Stand to Sit Sit to Stand: 5: Supervision;From bed;With upper extremity assist Stand to Sit: 5: Supervision;To chair/3-in-1;With upper extremity assist Stand Pivot Transfers: Not tested (comment) Details for Transfer Assistance: Verbal cues for safe technique.  Ambulation/Gait Ambulation/Gait Assistance: 4: Min guard Ambulation Distance (Feet): 150 Feet Assistive device: Rolling walker Ambulation/Gait Assistance Details: Pt required fewer cues for decreased dependence on UE Gait Pattern: Step-through pattern;Decreased stride length;Trunk rotated posteriorly on left;Trunk flexed Gait velocity: improved Stairs: No Wheelchair Mobility Wheelchair Mobility: No    Exercises     PT Diagnosis:    PT Problem List:   PT Treatment Interventions:     PT Goals Acute Rehab PT Goals PT Goal Formulation: With patient Time For Goal Achievement: 01/16/13 Potential to Achieve Goals: Good Pt will go Supine/Side to Sit: with  modified independence PT Goal: Supine/Side to Sit - Progress: Progressing toward goal Pt will go Sit to Supine/Side: with modified independence PT Goal: Sit to Supine/Side - Progress: Progressing toward goal Pt will go Sit to Stand: with modified independence PT Goal: Sit to Stand - Progress: Progressing toward goal Pt will go Stand to Sit: with modified independence PT Goal: Stand to Sit - Progress: Progressing toward goal Pt will Ambulate: with rolling walker;with modified independence;51 - 150 feet PT Goal: Ambulate - Progress: Progressing toward goal  Visit Information  Last PT Received On: 01/24/13 Assistance Needed: +1    Subjective Data  Subjective: Can I walk with the nurses.   Patient Stated Goal: wants to go to rehab   Cognition  Cognition Arousal/Alertness: Awake/alert Behavior During Therapy: WFL for tasks assessed/performed Overall Cognitive Status: Within Functional Limits for tasks assessed    Balance  Balance Balance Assessed: No  End of Session PT - End of Session Equipment Utilized During Treatment: Gait belt Activity Tolerance: Patient tolerated treatment well Patient left: in chair;with call bell/phone within reach Nurse Communication: Mobility status   GP     Alexandrea Westergard 01/24/2013, 6:53 PM Bernardina Cacho L. Aarion Kittrell DPT [phone]

## 2013-01-24 NOTE — Progress Notes (Signed)
Subjective: Interval History: none.  Objective: Vital signs in last 24 hours: Temp:  [97.7 F (36.5 C)-98.5 F (36.9 C)] 98.5 F (36.9 C) (04/24 0900) Pulse Rate:  [66-96] 96 (04/24 0900) Resp:  [18-20] 18 (04/24 0900) BP: (130-144)/(58-81) 142/58 mmHg (04/24 0900) SpO2:  [96 %-100 %] 100 % (04/24 0900) Weight:  [63.699 kg (140 lb 6.9 oz)] 63.699 kg (140 lb 6.9 oz) (04/23 2103) Weight change: 0 kg (0 lb)  Intake/Output from previous day: 04/23 0701 - 04/24 0700 In: 1200 [P.O.:1200] Out: 1552 [Urine:1550; Stool:2] Intake/Output this shift: Total I/O In: 240 [P.O.:240] Out: 442 [Urine:440; Stool:2]  General appearance: alert, cooperative and pale Resp: diminished breath sounds bilaterally Cardio: S1, S2 normal and systolic murmur: systolic ejection 2/6, decrescendo at 2nd left intercostal space GI: pos bs, liver down4 cm , distended, ^ tympany Extremities: edema 1+  Lab Results:  Recent Labs  01/23/13 0500 01/24/13 0356  WBC 7.1 6.9  HGB 8.6* 8.0*  HCT 24.0* 22.9*  PLT 196 186   BMET:  Recent Labs  01/23/13 0500 01/24/13 0356  NA 131* 134*  K 4.1 3.9  CL 92* 96  CO2 19 19  GLUCOSE 135* 127*  BUN 101* 106*  CREATININE 5.98* 5.75*  CALCIUM 8.8 9.1   No results found for this basename: PTH,  in the last 72 hours Iron Studies:  Recent Labs  01/21/13 1541  IRON 30*  TIBC 181*    Studies/Results: No results found.  I have reviewed the patient's current medications.  Assessment/Plan: 1 AKI slowly better.  Nonoliguric, K ok.  Mild RTA.  Will discuss with nursing removing cath vs leaving for blood draws. 2 Anemia mildly worse,follow 3 SBO/Bezoar stable 4 HTN controlled P d/c IJ cath soon . Can f/u outpatient soon , diet.    LOS: 24 days   Abdulaziz Toman L 01/24/2013,10:15 AM

## 2013-01-24 NOTE — Progress Notes (Signed)
Rehab Admissions Coordinator Note:  Patient was screened by Clois Dupes for appropriateness for an Inpatient Acute Rehab Consult.  At this time, we are recommending Skilled Nursing Facility. At supervision with transfers and minguard assist 150 feet with RW, patient is not in need of intense inpt rehab at this time. Home with home health and 24/7 assist of family or SNF is recommended at this level. Please call me for any questions.   Clois Dupes 01/24/2013, 9:11 PM  I can be reached at [phone].

## 2013-01-24 NOTE — Clinical Social Work Note (Signed)
CSW advised that patient should be ready for discharge on Saturday, 4/26. [facility] contacted and can take patient on Saturday. Daughter, Maguire Killmer [phone]) will be contacted regarding admissions paperwork at facility.  Genelle Bal, MSW, LCSW [phone]

## 2013-01-25 LAB — CBC
HCT: 20.5 % — ABNORMAL LOW (ref 36.0–46.0)
Hemoglobin: 7.3 g/dL — ABNORMAL LOW (ref 12.0–15.0)
RBC: 2.54 MIL/uL — ABNORMAL LOW (ref 3.87–5.11)
WBC: 5.3 10*3/uL (ref 4.0–10.5)

## 2013-01-25 LAB — RENAL FUNCTION PANEL
BUN: 102 mg/dL — ABNORMAL HIGH (ref 6–23)
Chloride: 94 mEq/L — ABNORMAL LOW (ref 96–112)
GFR calc Af Amer: 8 mL/min — ABNORMAL LOW (ref >90)
Glucose, Bld: 105 mg/dL — ABNORMAL HIGH (ref 70–99)
Potassium: 3.4 mEq/L — ABNORMAL LOW (ref 3.5–5.1)
Sodium: 133 mEq/L — ABNORMAL LOW (ref 135–145)

## 2013-01-25 LAB — GLUCOSE, CAPILLARY: Glucose-Capillary: 121 mg/dL — ABNORMAL HIGH (ref 70–99)

## 2013-01-25 MED ORDER — POTASSIUM CHLORIDE CRYS ER 20 MEQ PO TBCR
40.0000 meq | EXTENDED_RELEASE_TABLET | Freq: Once | ORAL | Status: AC
  Administered 2013-01-25: 40 meq via ORAL
  Filled 2013-01-25: qty 2

## 2013-01-25 NOTE — Progress Notes (Signed)
Subjective: Interval History: none.  Objective: Vital signs in last 24 hours: Temp:  [98.4 F (36.9 C)-98.7 F (37.1 C)] 98.6 F (37 C) (04/25 0455) Pulse Rate:  [79-97] 83 (04/25 0455) Resp:  [18] 18 (04/25 0455) BP: (121-146)/(53-69) 132/53 mmHg (04/25 0455) SpO2:  [93 %-100 %] 100 % (04/25 0455) Weight:  [63.277 kg (139 lb 8 oz)] 63.277 kg (139 lb 8 oz) (04/24 2034) Weight change: -0.422 kg (-14.9 oz)  Intake/Output from previous day: 04/24 0701 - 04/25 0700 In: 960 [P.O.:960] Out: 1067 [Urine:1065; Stool:2] Intake/Output this shift: Total I/O In: 220 [P.O.:220] Out: -   General appearance: alert, cooperative and pale Resp: diminished breath sounds bilaterally Cardio: systolic murmur: systolic ejection 2/6, decrescendo at 2nd left intercostal space GI: mod distension, pos bs Extremities: extremities normal, atraumatic, no cyanosis or edema  Lab Results:  Recent Labs  01/24/13 0356 01/25/13 0500  WBC 6.9 5.3  HGB 8.0* 7.3*  HCT 22.9* 20.5*  PLT 186 179   BMET:  Recent Labs  01/24/13 0356 01/25/13 0500  NA 134* 133*  K 3.9 3.4*  CL 96 94*  CO2 19 19  GLUCOSE 127* 105*  BUN 106* 102*  CREATININE 5.75* 5.21*  CALCIUM 9.1 9.2   No results found for this basename: PTH,  in the last 72 hours Iron Studies: No results found for this basename: IRON, TIBC, TRANSFERRIN, FERRITIN,  in the last 72 hours  Studies/Results: No results found.  I have reviewed the patient's current medications.  Assessment/Plan: 1 AKI slowly better.  Mild acidemia, vol ok.   Can d/c any time to outpatient f/u. 2 anemia lower today, follow 3 SBO/bezoar per surgery, appears doing well 4 Debill P pull cath when not needed for blood draws. , follow chem CBC    LOS: 25 days   [name] 01/25/2013,9:43 AM

## 2013-01-25 NOTE — Progress Notes (Signed)
Utilization review completed.  

## 2013-01-25 NOTE — Progress Notes (Signed)
Occupational Therapy Treatment Patient Details Name: [name] MRN: [id number] DOB: [birth year] Today's Date: 01/25/2013 Time: 1434-1500 OT Time Calculation (min): 26 min  OT Assessment / Plan / Recommendation Comments on Treatment Session Pt has progressed from last session. Pt stated she wasn't able to don socks when encouraged to don socks as she would shoes pt was able to cross both legs to don socks.  Pt was able to complete 2 tasks while at sink before taking a rest break.       Follow Up Recommendations  SNF       Equipment Recommendations  None recommended by OT    Recommendations for Other Services Rehab consult  Frequency Min 2X/week   Plan Discharge plan remains appropriate    Precautions / Restrictions Precautions Precautions: Fall Restrictions Weight Bearing Restrictions: No       ADL  Grooming: Performed;Wash/dry hands;Denture care;Teeth care;Min guard Where Assessed - Grooming: Unsupported standing Lower Body Dressing: Performed;Set up;Supervision/safety Where Assessed - Lower Body Dressing: Supported sit to stand Toilet Transfer: Performed;Min guard Statistician Method: Sit to stand Toileting - Architect and Hygiene: Performed;Min guard Where Assessed - Engineer, mining and Hygiene: Standing Equipment Used: Gait belt;Rolling walker Transfers/Ambulation Related to ADLs: Pt ambulated from recliner>sink>bathroom>sink>bed with min gurad A.  Pt needed Mod VCs for hand placement and safety of staying inside RW ADL Comments: Pt was able to reach out of BOS to obtain items for grooming with one rest break and was educated on energy conservation while doing tasks ie sitting vs standing to prevent fatigue; To don socks pt crossed legs over each other      OT Goals ADL Goals ADL Goal: Grooming - Progress: Progressing toward goals ADL Goal: Lower Body Dressing - Progress:  (goal Met 4/25 typo 4/22 BT) ADL Goal: Toilet Transfer -  Progress: Progressing toward goals ADL Goal: Toileting - Clothing Manipulation - Progress: Met ADL Goal: Toileting - Hygiene - Progress: Progressing toward goals  Visit Information  Last OT Received On: 01/25/13 Assistance Needed: +1    Subjective Data  Subjective: I can't put on my sock(when aasked to don socks)      Cognition  Cognition Arousal/Alertness: Awake/alert Behavior During Therapy: WFL for tasks assessed/performed Overall Cognitive Status: Within Functional Limits for tasks assessed    Mobility  Bed Mobility Bed Mobility: Sit to Supine;Scooting to HOB Sit to Supine: 5: Supervision;HOB flat;With rail Scooting to [facility]: 5: Supervision Transfers Transfers: Sit to Stand;Stand to Sit Sit to Stand: 4: Min guard;With upper extremity assist;With armrests;From chair/3-in-1;From toilet Stand to Sit: 4: Min guard;With upper extremity assist;With armrests;To bed;To toilet Details for Transfer Assistance: VCs for hand placement and controled decent       Balance Balance Balance Assessed: Yes Dynamic Sitting Balance Dynamic Sitting - Balance Support: During functional activity Dynamic Sitting - Level of Assistance: 7: Independent Dynamic Sitting Balance - Compensations: doffing socks Dynamic Standing Balance Dynamic Standing - Balance Support: During functional activity;No upper extremity supported Dynamic Standing - Level of Assistance:  (supervsion) Dynamic Standing - Comments: Reaching for object while at sink   End of Session OT - End of Session Equipment Utilized During Treatment: Gait belt Activity Tolerance: Patient tolerated treatment well Patient left: in bed;with call bell/phone within reach       Mayford Knife, [place] 01/25/2013, 3:24 PM

## 2013-01-25 NOTE — Progress Notes (Signed)
[facility] PROGRESS NOTE  [name] JYN:[id number] DOB: 1929-03-30 DOA: 12/31/2012 PCP: Allean Found, MD BRIEF PATIENT DESCRIPTION: 79 y/o without significant medical problems who developed abdominal pain, nausea, and vomiting 3 days prior to presentation to [facility] ED. Found to have high grade SBO with pneumatosis and portal venous gas. Exploratory Lap early am 01/01/13, found foreign body imbedded in Small Bowel ?Bezoar. Pt. with hypoxia with LLL airspace disease post op  Assessment/Plan: 1) Acute hypoxic respiratory failure> resolved  CXR w/ persist left base atx/ effus & LUL scarring  Plan:  -Continue Bronchial hygiene  -FU CXR in 3-4 weeks to ensure resolution  2) Paroxysmal A fib after surgery >> transiently required amiodarone >> now in sinus rhythm.  Grade 1 diastolic dysfx on Echo 4/05.  Hx of hyperlipidemia.  Plan:  -Continue norvasc  -Continue ASA, lipitor  - Relatively well controlled.  3) Acute on chronic renal failure likely from ATN in setting of shock. HD x3 last 4/14.   Plan:  Per renal UOP good and S Creatinine trending down but slowly Plan to remove catheter tomorrow Ok to DC per Renal, FU with Dr.Deterding or Dr.Powel in 1-2 weeks -labs next week  4) Distal SBO s/p laparotomy 4/1 with dx foreign body removal and peritonitis - pathology showed phytobezoar  Dysphagia.  Plan:  Post-op care per CCS  Currently tolerating diet.  5) Anemia of critical illness and chronic disease.  Thrombocytopenia of critical illness.  Hx of DVT with Factor V Leiden.  Plan:  -Monitor, thrombocytopenia resolved. -transfused 1 unit PRBC 4/22  6) Septic shock from aspiration pneumonia, and peritonitis >> resolved.  Plan:  -Monitor off Abx. Patient has been asymptomatic with no WBC or fevers.   -completed abx course  7) Hyperglycemia >> no hx of DM/ borderline DM Plan:  SSI  hgbA1c 4/12 >>>6.2   8) Deconditioning.  Plan:  PT/OT   Code Status: full Family  Communication: none at bedside. Disposition Plan: pt medically stable for DC, requests for DC to SNF tomorrow, hence will oblige   Consultants:  Nephrology  Pt/Ot   Procedures:  HD  Bipap  Antibiotics:  none  HPI/Subjective: Doing well, no complaints  Objective: Filed Vitals:   01/24/13 1917 01/24/13 2034 01/25/13 0455 01/25/13 0915  BP: 146/69 136/56 132/53 142/55  Pulse: 81 79 83 87  Temp: 98.7 F (37.1 C) 98.4 F (36.9 C) 98.6 F (37 C) 98.5 F (36.9 C)  TempSrc: Oral Oral Oral Oral  Resp: 18 18 18 19   Height:      Weight:  63.277 kg (139 lb 8 oz)    SpO2: 93% 99% 100% 97%    Intake/Output Summary (Last 24 hours) at 01/25/13 1058 Last data filed at 01/25/13 0858  Gross per 24 hour  Intake    700 ml  Output    625 ml  Net     75 ml   Filed Weights   01/22/13 2041 01/23/13 2103 01/24/13 2034  Weight: 63.699 kg (140 lb 6.9 oz) 63.699 kg (140 lb 6.9 oz) 63.277 kg (139 lb 8 oz)    Exam:   General:  Pt in NAD, Alert and Awake  Cardiovascular: S1 and S2 WNL, No MRG  Respiratory: CTA BL, no increased work of breathing  Abdomen: soft, NT, ND  Musculoskeletal: no cyanosis or clubbing   Data Reviewed: Basic Metabolic Panel:  Recent Labs Lab 01/21/13 0500 01/22/13 0545 01/23/13 0500 01/24/13 0356 01/25/13 0500  NA 132* 130* 131* 134*  133*  K 3.7 3.2* 4.1 3.9 3.4*  CL 92* 90* 92* 96 94*  CO2 21 21 19 19 19   GLUCOSE 121* 193* 135* 127* 105*  BUN 90* 100* 101* 106* 102*  CREATININE 6.31* 6.47* 5.98* 5.75* 5.21*  CALCIUM 8.3* 8.2* 8.8 9.1 9.2  MG  --   --  1.6  --   --   PHOS 10.0* 10.3* 9.3* 9.1* 8.4*   Liver Function Tests:  Recent Labs Lab 01/21/13 0500 01/22/13 0545 01/23/13 0500 01/24/13 0356 01/25/13 0500  ALBUMIN 2.0* 1.9* 2.3* 2.3* 2.2*   No results found for this basename: LIPASE, AMYLASE,  in the last 168 hours No results found for this basename: AMMONIA,  in the last 168 hours CBC:  Recent Labs Lab 01/19/13 0400  01/22/13 0500 01/23/13 0500 01/24/13 0356 01/25/13 0500  WBC 6.2 4.3 7.1 6.9 5.3  HGB 7.6* 7.0* 8.6* 8.0* 7.3*  HCT 21.4* 19.5* 24.0* 22.9* 20.5*  MCV 81.1 80.6 80.0 80.9 80.7  PLT 169 166 196 186 179   Cardiac Enzymes: No results found for this basename: CKTOTAL, CKMB, CKMBINDEX, TROPONINI,  in the last 168 hours BNP (last 3 results)  Recent Labs  12/31/12 1940 01/06/13 0500 01/07/13 0830  PROBNP 3133.0* 6137.0* 6067.0*   CBG:  Recent Labs Lab 01/24/13 0734 01/24/13 1318 01/24/13 1640 01/24/13 2030 01/25/13 0753  GLUCAP 114* >600* 125* 107* 112*    No results found for this or any previous visit (from the past 240 hour(s)).   Studies: No results found.  Scheduled Meds: . amLODipine  10 mg Oral Daily  . antiseptic oral rinse  15 mL Mouth Rinse BID  . aspirin EC  81 mg Oral Daily  . atorvastatin  10 mg Oral q1800  . ciprofloxacin  2 drop Both Eyes Q4H while awake  . feeding supplement  1 Container Oral TID BM  . heparin subcutaneous  5,000 Units Subcutaneous Q8H  . insulin aspart  0-15 Units Subcutaneous TID WC  . potassium chloride  40 mEq Oral Once  . sodium chloride  10-40 mL Intracatheter Q12H   Continuous Infusions:   Principal Problem:   SBO (small bowel obstruction) Active Problems:   PNA (pneumonia)   HTN (hypertension)   Hypoxia   Hyponatremia   Dehydration   Acute renal failure   Hyperglycemia   Acute respiratory failure   Hypovolemic shock   Sepsis(995.91)   Acute blood loss anemia    Time spent: >35 minutes    [facility]  [facility] Pager [phone]. If 7PM-7AM, please contact night-coverage at [email or web address], password [facility] 01/25/2013, 10:58 AM  LOS: 25 days

## 2013-01-25 NOTE — Progress Notes (Signed)
CSW spoke with Donne Hazel- Admissions Director of [facility].  Per coverage report- patient was scheduled for d/c tomorrow 4/26 to [facility].  Per Mrs. Lowa- they will NOT be able to take patient on Saturday and want her BUN and Creatinine to be more stable prior to d/c. They will reevaluate patient on Monday with plan to accept her then if stable.  CSW spoke with patient's daughter Kabria Hetzer  [phone] to discuss above.  She lives in [place] and will not be here when patient is d/c'd.  There are admission papers that will need to be signed on Monday. She will arrange for a local family member to coordinate this with Ms. Lowa so that forms can be signed on Monday.  CSW will follow up with MD, Patient and family on Monday.   Lorri Frederick. [place]  [phone]   (coverage)

## 2013-01-26 DIAGNOSIS — D631 Anemia in chronic kidney disease: Secondary | ICD-10-CM

## 2013-01-26 DIAGNOSIS — N189 Chronic kidney disease, unspecified: Secondary | ICD-10-CM

## 2013-01-26 LAB — CBC
HCT: 20.1 % — ABNORMAL LOW (ref 36.0–46.0)
Hemoglobin: 7.1 g/dL — ABNORMAL LOW (ref 12.0–15.0)
RDW: 14.2 % (ref 11.5–15.5)
WBC: 5.4 10*3/uL (ref 4.0–10.5)

## 2013-01-26 LAB — RENAL FUNCTION PANEL
CO2: 19 mEq/L (ref 19–32)
GFR calc Af Amer: 9 mL/min — ABNORMAL LOW (ref >90)
Glucose, Bld: 113 mg/dL — ABNORMAL HIGH (ref 70–99)
Potassium: 3.9 mEq/L (ref 3.5–5.1)
Sodium: 133 mEq/L — ABNORMAL LOW (ref 135–145)

## 2013-01-26 LAB — ABO/RH: ABO/RH(D): O POS

## 2013-01-26 LAB — GLUCOSE, CAPILLARY: Glucose-Capillary: 113 mg/dL — ABNORMAL HIGH (ref 70–99)

## 2013-01-26 NOTE — Progress Notes (Signed)
Jenny Elfriede Bonini, OTA/L 

## 2013-01-26 NOTE — Progress Notes (Signed)
Pt has completed transfusion of 1 unit of PRBCs. IV team paged to de-access HD cath with pigtail. Placed fluids to [facility]. Will continue to monitor.

## 2013-01-26 NOTE — Progress Notes (Signed)
Subjective: Interval History: none.  Objective: Vital signs in last 24 hours: Temp:  [98 F (36.7 C)-98.8 F (37.1 C)] 98.3 F (36.8 C) (04/26 0933) Pulse Rate:  [74-85] 80 (04/26 0933) Resp:  [18] 18 (04/26 0933) BP: (127-147)/(50-60) 147/60 mmHg (04/26 0933) SpO2:  [97 %-100 %] 100 % (04/26 0933) Weight:  [63.4 kg (139 lb 12.4 oz)] 63.4 kg (139 lb 12.4 oz) (04/25 2149) Weight change: 0.123 kg (4.4 oz)  Intake/Output from previous day: 04/25 0701 - 04/26 0700 In: 660 [P.O.:660] Out: 35 [Urine:35] Intake/Output this shift: Total I/O In: 660 [P.O.:660] Out: -   General appearance: alert, cooperative and pale Resp: diminished breath sounds bilaterally Cardio: S1, S2 normal and systolic murmur: systolic ejection 2/6, decrescendo at 2nd left intercostal space GI: mod distension,pos bs,  Extremities: extremities normal, atraumatic, no cyanosis or edema  Lab Results:  Recent Labs  01/25/13 0500 01/26/13 0455  WBC 5.3 5.4  HGB 7.3* 7.1*  HCT 20.5* 20.1*  PLT 179 172   BMET:  Recent Labs  01/25/13 0500 01/26/13 0505  NA 133* 133*  K 3.4* 3.9  CL 94* 98  CO2 19 19  GLUCOSE 105* 113*  BUN 102* 103*  CREATININE 5.21* 4.74*  CALCIUM 9.2 8.9   No results found for this basename: PTH,  in the last 72 hours Iron Studies: No results found for this basename: IRON, TIBC, TRANSFERRIN, FERRITIN,  in the last 72 hours  Studies/Results: No results found.  I have reviewed the patient's current medications.  Assessment/Plan: 1 AKI improving slowly. Vol ok, mild acidemia and ^ phos, all should get better and would not start more meds. Can D/C cath anytime 2 Anemia to get blood 3 Nutrition picking up P will s/o but see again at your request.  Need to make sure Cr comes down, let us know if we can help    LOS: 26 days   Nakeem Murnane L 01/26/2013,11:20 AM

## 2013-01-26 NOTE — Plan of Care (Signed)
Problem: Phase I Progression Outcomes Goal: OOB as tolerated unless otherwise ordered Outcome: Completed/Met Date Met:  01/26/13 Sitting in chair most of the day

## 2013-01-26 NOTE — Progress Notes (Signed)
Pt requesting a shower. MD notified, will continue to monitor.

## 2013-01-26 NOTE — Progress Notes (Signed)
[facility] PROGRESS NOTE  [name] WUJ:[id number] DOB: 1929/07/21 DOA: 12/31/2012 PCP: Allean Found, MD BRIEF PATIENT DESCRIPTION: 79 y/o without significant medical problems who developed abdominal pain, nausea, and vomiting 3 days prior to presentation to [facility] ED. Found to have high grade SBO with pneumatosis and portal venous gas. Exploratory Lap early am 01/01/13, found foreign body imbedded in Small Bowel ?Bezoar. Pt. with hypoxia with LLL airspace disease post op  Assessment/Plan: 1) Acute hypoxic respiratory failure> resolved  CXR w/ persist left base atx/ effus & LUL scarring  Plan:  -Continue Bronchial hygiene  -FU CXR in 3-4 weeks to ensure resolution  2) Paroxysmal A fib after surgery >> transiently required amiodarone >> now in sinus rhythm.  Grade 1 diastolic dysfx on Echo 4/05.  Hx of hyperlipidemia.  Plan:  -Continue norvasc  -Continue ASA, lipitor  - Relatively well controlled.  3) Acute on chronic renal failure likely from ATN in setting of shock. HD x3 last 4/14.   Plan:  Per renal UOP good and S Creatinine trending down but slowly Plan to remove catheter tomorrow Ok to DC per Renal, FU with Dr.Deterding or Dr.Powel in 1-2 weeks -labs next week  4) Distal SBO s/p laparotomy 4/1 with dx foreign body removal and peritonitis - pathology showed phytobezoar  Dysphagia.  Plan:  Post-op care per [facility]  Currently tolerating diet.  5) Anemia of critical illness and chronic disease.  Thrombocytopenia of critical illness.  Hx of DVT with Factor V Leiden.  Plan:  -Monitor, thrombocytopenia resolved. -transfused 1 unit PRBC 4/22 -transfuse 1 unit PRBC today  6) Septic shock from aspiration pneumonia, and peritonitis >> resolved.  Plan:  -Monitor off Abx. Patient has been asymptomatic with no WBC or fevers.   -completed abx course  7) Hyperglycemia >> borderline DM Plan:  SSI  hgbA1c 4/12 >>>6.2   8) Deconditioning.  Plan:  PT/OT SNF  Code  Status: full Family Communication: none at bedside. Disposition Plan: pt medically stable for DC, unfortunately the Facility would not accept her yesterday or today due to her creatinine and hence DC only on  Monday now  Consultants:  Nephrology  Pt/Ot   Procedures:  HD  Bipap  Antibiotics:  none  HPI/Subjective: Doing well, no complaints, unfortunately the facility will not accept her till Monday  Objective: Filed Vitals:   01/25/13 1346 01/25/13 1840 01/25/13 2149 01/26/13 0453  BP: 134/55 141/50 142/55 127/50  Pulse: 79 84 85 74  Temp: 98 F (36.7 C) 98.8 F (37.1 C) 98.3 F (36.8 C) 98.8 F (37.1 C)  TempSrc: Oral Oral Oral Oral  Resp: 18 18 18 18   Height:      Weight:   63.4 kg (139 lb 12.4 oz)   SpO2: 97% 100% 98% 98%    Intake/Output Summary (Last 24 hours) at 01/26/13 0845 Last data filed at 01/26/13 0700  Gross per 24 hour  Intake    660 ml  Output     35 ml  Net    625 ml   Filed Weights   01/23/13 2103 01/24/13 2034 01/25/13 2149  Weight: 63.699 kg (140 lb 6.9 oz) 63.277 kg (139 lb 8 oz) 63.4 kg (139 lb 12.4 oz)    Exam:   General:  Pt in NAD, Alert and Awake  Cardiovascular: S1 and S2 WNL, No MRG  Respiratory: CTA BL, no increased work of breathing  Abdomen: soft, NT, ND  Musculoskeletal: no cyanosis or clubbing   Data Reviewed: Basic  Metabolic Panel:  Recent Labs Lab 01/22/13 0545 01/23/13 0500 01/24/13 0356 01/25/13 0500 01/26/13 0505  NA 130* 131* 134* 133* 133*  K 3.2* 4.1 3.9 3.4* 3.9  CL 90* 92* 96 94* 98  CO2 21 19 19 19 19   GLUCOSE 193* 135* 127* 105* 113*  BUN 100* 101* 106* 102* 103*  CREATININE 6.47* 5.98* 5.75* 5.21* 4.74*  CALCIUM 8.2* 8.8 9.1 9.2 8.9  MG  --  1.6  --   --   --   PHOS 10.3* 9.3* 9.1* 8.4* 7.4*   Liver Function Tests:  Recent Labs Lab 01/22/13 0545 01/23/13 0500 01/24/13 0356 01/25/13 0500 01/26/13 0505  ALBUMIN 1.9* 2.3* 2.3* 2.2* 2.2*   No results found for this basename:  LIPASE, AMYLASE,  in the last 168 hours No results found for this basename: AMMONIA,  in the last 168 hours CBC:  Recent Labs Lab 01/22/13 0500 01/23/13 0500 01/24/13 0356 01/25/13 0500 01/26/13 0455  WBC 4.3 7.1 6.9 5.3 5.4  HGB 7.0* 8.6* 8.0* 7.3* 7.1*  HCT 19.5* 24.0* 22.9* 20.5* 20.1*  MCV 80.6 80.0 80.9 80.7 81.0  PLT 166 196 186 179 172   Cardiac Enzymes: No results found for this basename: CKTOTAL, CKMB, CKMBINDEX, TROPONINI,  in the last 168 hours BNP (last 3 results)  Recent Labs  12/31/12 1940 01/06/13 0500 01/07/13 0830  PROBNP 3133.0* 6137.0* 6067.0*   CBG:  Recent Labs Lab 01/25/13 0753 01/25/13 1138 01/25/13 1700 01/25/13 2146 01/26/13 0731  GLUCAP 112* 148* 105* 121* 113*    No results found for this or any previous visit (from the past 240 hour(s)).   Studies: No results found.  Scheduled Meds: . amLODipine  10 mg Oral Daily  . antiseptic oral rinse  15 mL Mouth Rinse BID  . aspirin EC  81 mg Oral Daily  . atorvastatin  10 mg Oral q1800  . ciprofloxacin  2 drop Both Eyes Q4H while awake  . feeding supplement  1 Container Oral TID BM  . heparin subcutaneous  5,000 Units Subcutaneous Q8H  . insulin aspart  0-15 Units Subcutaneous TID WC  . sodium chloride  10-40 mL Intracatheter Q12H   Continuous Infusions:   Principal Problem:   SBO (small bowel obstruction) Active Problems:   PNA (pneumonia)   HTN (hypertension)   Hypoxia   Hyponatremia   Dehydration   Acute renal failure   Hyperglycemia   Acute respiratory failure   Hypovolemic shock   Sepsis(995.91)   Acute blood loss anemia    Time spent: >35 minutes    [facility]  [facility] Pager [phone]. If 7PM-7AM, please contact night-coverage at [email or web address], password [facility] 01/26/2013, 8:45 AM  LOS: 26 days

## 2013-01-27 LAB — TYPE AND SCREEN
Antibody Screen: NEGATIVE
Unit division: 0

## 2013-01-27 LAB — RENAL FUNCTION PANEL
BUN: 105 mg/dL — ABNORMAL HIGH (ref 6–23)
Calcium: 8.9 mg/dL (ref 8.4–10.5)
Creatinine, Ser: 4.46 mg/dL — ABNORMAL HIGH (ref 0.50–1.10)
GFR calc Af Amer: 10 mL/min — ABNORMAL LOW (ref >90)
Glucose, Bld: 106 mg/dL — ABNORMAL HIGH (ref 70–99)
Phosphorus: 7.1 mg/dL — ABNORMAL HIGH (ref 2.3–4.6)
Sodium: 134 mEq/L — ABNORMAL LOW (ref 135–145)

## 2013-01-27 LAB — CBC
HCT: 24 % — ABNORMAL LOW (ref 36.0–46.0)
MCH: 28.4 pg (ref 26.0–34.0)
MCHC: 35.4 g/dL (ref 30.0–36.0)
MCV: 80.3 fL (ref 78.0–100.0)
RDW: 14.6 % (ref 11.5–15.5)

## 2013-01-27 LAB — GLUCOSE, CAPILLARY: Glucose-Capillary: 118 mg/dL — ABNORMAL HIGH (ref 70–99)

## 2013-01-27 NOTE — Progress Notes (Signed)
[facility] PROGRESS NOTE  [name] WUJ:[id number] DOB: 1929/08/24 DOA: 12/31/2012 PCP: Allean Found, MD BRIEF PATIENT DESCRIPTION: 79 y/o without significant medical problems who developed abdominal pain, nausea, and vomiting 3 days prior to presentation to [facility] ED. Found to have high grade SBO with pneumatosis and portal venous gas. Exploratory Lap early am 01/01/13, found foreign body imbedded in Small Bowel ?Bezoar. Pt. with hypoxia with LLL airspace disease post op  Assessment/Plan: 1) Acute hypoxic respiratory failure> resolved  CXR w/ persist left base atx/ effus & LUL scarring  Plan:  -Continue Bronchial hygiene  -FU CXR in 3-4 weeks to ensure resolution  2) Paroxysmal A fib after surgery >> transiently required amiodarone >> now in sinus rhythm.  Grade 1 diastolic dysfx on Echo 4/05.  Hx of hyperlipidemia.  Plan:  -Continue norvasc  -Continue ASA, lipitor  - Relatively well controlled.  3) Acute on chronic renal failure likely from ATN in setting of shock. HD x3 last 4/14.   Plan:  Per renal UOP good and S Creatinine trending down but slowly Plan to remove catheter tomorrow Ok to DC per Renal, FU with Dr.Deterding or Dr.Powel in 1-2 weeks -labs next week  4) Distal SBO s/p laparotomy 4/1 with dx foreign body removal and peritonitis - pathology showed phytobezoar  Dysphagia.  Plan:  Post-op care per [facility]  Currently tolerating diet.  5) Anemia of critical illness and chronic disease.  Thrombocytopenia of critical illness.  Hx of DVT with Factor V Leiden.  Plan:  -Monitor, thrombocytopenia resolved. -transfused 1 unit PRBC 4/22 and 1 unit on 4/26   6) Septic shock from aspiration pneumonia, and peritonitis >> resolved.  Plan:  -Monitor off Abx. Patient has been asymptomatic with no WBC or fevers.   -completed abx course  7) Hyperglycemia >> borderline DM Plan:  SSI  hgbA1c 4/12 >>>6.2   8) Deconditioning.  Plan:  PT/OT SNF  Code Status:  full Family Communication: none at bedside. Disposition Plan: pt medically stable for DC, unfortunately the Facility would not accept her on Friday or over weekend due to her creatinine and hence DC only on  Monday   Consultants:  Nephrology  Pt/Ot   Procedures:  HD  Bipap  Antibiotics:  none  HPI/Subjective: Doing well, no complaints, unfortunately the facility will not accept her till Monday  Objective: Filed Vitals:   01/26/13 1530 01/26/13 1749 01/26/13 2001 01/27/13 0423  BP: 139/52 144/53 101/72 146/62  Pulse: 77 82 93 66  Temp: 98.2 F (36.8 C) 98.3 F (36.8 C) 97.9 F (36.6 C) 98.1 F (36.7 C)  TempSrc: Oral Oral Oral Oral  Resp: 14 15 18 18   Height:      Weight:   59.421 kg (131 lb)   SpO2:  100% 97% 99%    Intake/Output Summary (Last 24 hours) at 01/27/13 1021 Last data filed at 01/27/13 0944  Gross per 24 hour  Intake   1050 ml  Output    500 ml  Net    550 ml   Filed Weights   01/24/13 2034 01/25/13 2149 01/26/13 2001  Weight: 63.277 kg (139 lb 8 oz) 63.4 kg (139 lb 12.4 oz) 59.421 kg (131 lb)    Exam:   General:  Pt in NAD, Alert and Awake  HEENT: RIJ TLC  Cardiovascular: S1 and S2 WNL, No MRG  Respiratory: CTA BL, no increased work of breathing  Abdomen: soft, NT, ND  Musculoskeletal: no cyanosis or clubbing   Data Reviewed:  Basic Metabolic Panel:  Recent Labs Lab 01/23/13 0500 01/24/13 0356 01/25/13 0500 01/26/13 0505 01/27/13 0600  NA 131* 134* 133* 133* 134*  K 4.1 3.9 3.4* 3.9 4.0  CL 92* 96 94* 98 99  CO2 19 19 19 19 19   GLUCOSE 135* 127* 105* 113* 106*  BUN 101* 106* 102* 103* 105*  CREATININE 5.98* 5.75* 5.21* 4.74* 4.46*  CALCIUM 8.8 9.1 9.2 8.9 8.9  MG 1.6  --   --   --   --   PHOS 9.3* 9.1* 8.4* 7.4* 7.1*   Liver Function Tests:  Recent Labs Lab 01/23/13 0500 01/24/13 0356 01/25/13 0500 01/26/13 0505 01/27/13 0600  ALBUMIN 2.3* 2.3* 2.2* 2.2* 2.2*   No results found for this basename: LIPASE,  AMYLASE,  in the last 168 hours No results found for this basename: AMMONIA,  in the last 168 hours CBC:  Recent Labs Lab 01/23/13 0500 01/24/13 0356 01/25/13 0500 01/26/13 0455 01/27/13 0600  WBC 7.1 6.9 5.3 5.4 6.4  HGB 8.6* 8.0* 7.3* 7.1* 8.5*  HCT 24.0* 22.9* 20.5* 20.1* 24.0*  MCV 80.0 80.9 80.7 81.0 80.3  PLT 196 186 179 172 163   Cardiac Enzymes: No results found for this basename: CKTOTAL, CKMB, CKMBINDEX, TROPONINI,  in the last 168 hours BNP (last 3 results)  Recent Labs  12/31/12 1940 01/06/13 0500 01/07/13 0830  PROBNP 3133.0* 6137.0* 6067.0*   CBG:  Recent Labs Lab 01/26/13 0731 01/26/13 1143 01/26/13 1643 01/26/13 2119 01/27/13 0608  GLUCAP 113* 132* 128* 109* 109*    No results found for this or any previous visit (from the past 240 hour(s)).   Studies: No results found.  Scheduled Meds: . amLODipine  10 mg Oral Daily  . antiseptic oral rinse  15 mL Mouth Rinse BID  . aspirin EC  81 mg Oral Daily  . atorvastatin  10 mg Oral q1800  . ciprofloxacin  2 drop Both Eyes Q4H while awake  . feeding supplement  1 Container Oral TID BM  . heparin subcutaneous  5,000 Units Subcutaneous Q8H  . insulin aspart  0-15 Units Subcutaneous TID WC  . sodium chloride  10-40 mL Intracatheter Q12H   Continuous Infusions:   Principal Problem:   SBO (small bowel obstruction) Active Problems:   PNA (pneumonia)   HTN (hypertension)   Hypoxia   Hyponatremia   Dehydration   Acute renal failure   Hyperglycemia   Acute respiratory failure   Hypovolemic shock   Sepsis(995.91)   Acute blood loss anemia   Anemia in chronic kidney disease    Time spent: >35 minutes    [facility]  [facility] Pager [phone]. If 7PM-7AM, please contact night-coverage at [email or web address], password [facility] 01/27/2013, 10:21 AM  LOS: 27 days

## 2013-01-28 DIAGNOSIS — K56609 Unspecified intestinal obstruction, unspecified as to partial versus complete obstruction: Secondary | ICD-10-CM

## 2013-01-28 DIAGNOSIS — D62 Acute posthemorrhagic anemia: Secondary | ICD-10-CM

## 2013-01-28 DIAGNOSIS — N179 Acute kidney failure, unspecified: Secondary | ICD-10-CM

## 2013-01-28 LAB — GLUCOSE, CAPILLARY
Glucose-Capillary: 123 mg/dL — ABNORMAL HIGH (ref 70–99)
Glucose-Capillary: 97 mg/dL (ref 70–99)

## 2013-01-28 LAB — RENAL FUNCTION PANEL
CO2: 20 mEq/L (ref 19–32)
Chloride: 99 mEq/L (ref 96–112)
Creatinine, Ser: 4.12 mg/dL — ABNORMAL HIGH (ref 0.50–1.10)
GFR calc Af Amer: 11 mL/min — ABNORMAL LOW (ref >90)
GFR calc non Af Amer: 9 mL/min — ABNORMAL LOW (ref >90)

## 2013-01-28 NOTE — Clinical Social Work Note (Addendum)
11:43am- spoke to [place] at [place] confirmed receipt of updated clinicals.  EMS authorization received #[id number].  CSW sent updated clinicals to both [employer] and [employer].    Vickii Penna, LCSWA [phone]  Clinical Social Work

## 2013-01-28 NOTE — Discharge Summary (Signed)
Physician Discharge Summary  [name] XBJ:[id number] DOB: March 26, 1929 DOA: 12/31/2012  PCP: Allean Found, MD  Admit date: 12/31/2012 Discharge date: 01/28/2013  Time spent: 50 minutes  Recommendations for Outpatient Follow-up:  1. PCP in 1 week 2. Dr.Brain Layton, CCS in 1 week 3. Bmet in 3-4 days, please fax results to Dr/Deterding, [facility] 4. Dr.Deterding with [facility] in 7-10days  Discharge Diagnoses:  Principal Problem:   SBO (small bowel obstruction) Active Problems:   PNA (pneumonia)   HTN (hypertension)   Hypoxia   Hyponatremia   Dehydration   Acute renal failure/ATN   Hyperglycemia   Acute respiratory failure   Hypovolemic shock   Sepsis(995.91)   Acute blood loss anemia   Anemia in chronic kidney disease   Discharge Condition: stable  Diet recommendation: low sodium  Filed Weights   01/25/13 2149 01/26/13 2001 01/27/13 2102  Weight: 63.4 kg (139 lb 12.4 oz) 59.421 kg (131 lb) 59.421 kg (131 lb)    His83 yo without significant medical problems who developed abdominal pain, nausea, and vomiting 3 days prior to presentation to [facility] ED. Found to have high grade SBO with pneumatosis and portal venous gas. Also, hypoxic with LLL airspace disease.  Hospital Course:  1) Acute hypoxic respiratory failure> resolved  S/P Ventilator dependent respiratory failure Extubated 01/04/13 CXR w/ persist left base atx/ effus & LUL scarring  -Continue Bronchial hygiene, weaned off O2 -FU CXR in 3-4 weeks to ensure resolution   2) Paroxysmal A fib after surgery >> transiently required amiodarone >> now in sinus rhythm.  Grade 1 diastolic dysfx on Echo 4/05.  Hx of hyperlipidemia.  -Continue norvasc  -Continue ASA, lipitor  - Relatively well controlled.   3) Acute on chronic renal failure likely from ATN in setting of shock.  Was dialyzed temporarily Hemodialysis  x3 last dialysis on 4/14.  Urine output good and Creatinine  trending down but slowly  Ok to DC per Renal, FU with Dr.Deterding 1-2 weeks  -labs next week   4) Distal SBO s/p laparotomy 4/1 with dx foreign body removal and peritonitis - pathology showed phytobezoar  Dysphagia.  Did well postoperatively Currently tolerating diet.   5) Anemia of critical illness and chronic disease.  Thrombocytopenia of critical illness.  Hx of DVT with Factor V Leiden.  -Monitor, thrombocytopenia resolved.  -transfused 1 unit PRBC 4/22 and 1 unit on 4/26   6) Septic shock from aspiration pneumonia, and peritonitis >> resolved.  Plan:  -Patient has been asymptomatic now with no WBC or fevers.  -completed antibiotic course   7) Hyperglycemia >> borderline DM  hgbA1c 4/12 >>>6.2   8) Deconditioning.  Plan:  PT/OT  SNF    Discharge Exam: Filed Vitals:   01/27/13 1400 01/27/13 1800 01/27/13 2102 01/28/13 0509  BP: 141/59 138/54 120/55 137/57  Pulse: 84 77 77 83  Temp: 98.2 F (36.8 C) 97.8 F (36.6 C) 98 F (36.7 C) 98.6 F (37 C)  TempSrc: Oral Oral Oral Oral  Resp: 18 20 20 18   Height:      Weight:   59.421 kg (131 lb)   SpO2: 98% 98% 100% 97%    General:AAOx3,  Cardiovascular: S1S2/RRR Respiratory: CTAB  Discharge Instructions  Discharge Orders   Future Orders Complete By Expires     Diet - low sodium heart healthy  As directed     Increase activity slowly  As directed         Medication List  STOP taking these medications       fexofenadine 180 MG tablet  Commonly known as:  ALLEGRA     irbesartan 300 MG tablet  Commonly known as:  AVAPRO      TAKE these medications       acetaminophen 325 MG tablet  Commonly known as:  TYLENOL  Take 2 tablets (650 mg total) by mouth every 4 (four) hours as needed.     amLODipine 10 MG tablet  Commonly known as:  NORVASC  Take 10 mg by mouth daily.     aspirin EC 81 MG tablet  Take 81 mg by mouth daily.     calcium carbonate 500 MG chewable tablet  Commonly known as:  TUMS -  dosed in mg elemental calcium  Chew 1-2 tablets (200-400 mg of elemental calcium total) by mouth 3 (three) times daily as needed.     ciprofloxacin 0.3 % ophthalmic solution  Commonly known as:  CILOXAN  Place 2 drops into both eyes every 4 (four) hours while awake. Administer 1 drop, every 2 hours, while awake, for 2 days. Then 1 drop, every 4 hours, while awake, for the next 5 days.     feeding supplement Liqd  Take 1 Container by mouth 3 (three) times daily between meals.     insulin aspart 100 UNIT/ML injection  Commonly known as:  novoLOG  Inject 0-15 Units into the skin 3 (three) times daily with meals.     multivitamin with minerals Tabs  Take 1 tablet by mouth daily.     rosuvastatin 5 MG tablet  Commonly known as:  CRESTOR  Take 5 mg by mouth daily.           Follow-up Information   Follow up with LAYTON, BRIAN DAVID, DO.   Contact information:   [address] [place] [address] [phone]       Schedule an appointment as soon as possible for a visit with LAYTON, BRIAN DAVID, DO.   Contact information:   [address] [place] [address] [phone]        The results of significant diagnostics from this hospitalization (including imaging, microbiology, ancillary and laboratory) are listed below for reference.    Significant Diagnostic Studies: Ct Abdomen Pelvis Wo Contrast  12/31/2012  *RADIOLOGY REPORT*  Clinical Data: Abdominal pain  CT ABDOMEN AND PELVIS WITHOUT CONTRAST  Technique:  Multidetector CT imaging of the abdomen and pelvis was performed following the standard protocol without intravenous contrast.  Comparison: None.  Findings: Extensive portal venous gas is seen throughout the liver.  There is extensive pneumatosis involving the distal duodenum and proximal jejunum.  No obvious internal hernia can be identified. Duodenal diverticulum is noted  Markedly dilated small bowel loops are seen leading to the pelvis. See  image 72.  There is a focal ball of stool with decompressed small bowel distally.  Colon is also completely decompressed. Little if any colonic gas.  This is compatible with a high-grade distal small bowel obstruction.  Extensive atherosclerotic vascular calcifications in the aorta and visceral vasculature.  No free intraperitoneal gas.  No abscess.  Marked distention of the stomach.  There is dense consolidation at the base of the lingula and left lower lobe with bronchiectasis.  Similar changes but to a lesser degree in the dependent portion of the right lower lobe.  Kidneys are atrophic.  Large benign appearing cystic lesion emanating from the lower pole of the left  kidney.  Spleen is unremarkable.  Pancreas is atrophic.  Unremarkable gallbladder.  Degenerative changes in the lumbar spine without vertebral compression deformity.  IMPRESSION: There is pneumatosis involving the distal duodenum and proximal jejunum worrisome for ischemia.  Extensive portal venous gas in the liver is associated.  High-grade distal small bowel obstruction.  Extensive bibasilar consolidation left greater than right.  There is also bronchiectasis.  Critical Value/emergent results were called by telephone at the time of interpretation on 12/31/2012 at 2030 hours to Dr. Rhunette Croft, who verbally acknowledged these results.   Original Report Authenticated By: Jolaine Click, M.D.    Dg Chest 2 View  01/22/2013  *RADIOLOGY REPORT*  Clinical Data: Follow-up effusion and atelectasis.  CHEST - 2 VIEW  Comparison: 01/19/2013  Findings: Right central venous line unchanged.  Stable heart silhouette.  There is dense left retrocardiac opacity again demonstrated.  Linear opacity in the left upper lobe is unchanged. No pneumothorax.  IMPRESSION:  1.  No significant change. 2.  Left retrocardiac opacity presenting  pneumonia with small effusion. 3.  Left upper lobe opacity representing atelectasis versus pneumonia.   Original Report Authenticated By:  Genevive Bi, M.D.    Dg Chest 2 View  01/19/2013  *RADIOLOGY REPORT*  Clinical Data: Follow up pneumonia  CHEST - 2 VIEW  Comparison: 01/10/2013  Findings: The patient is rotated.  Left upper lobe opacity, suspicious for pneumonia, improved.  Moderate left pleural effusion, stable versus mildly increased.  Cardiomegaly.  Stable right IJ venous catheter.  IMPRESSION: Left upper lobe opacity, suspicious for pneumonia, improved.  Moderate left pleural effusion, stable versus mildly increased.   Original Report Authenticated By: Charline Bills, M.D.    US Renal Port  01/02/2013  *RADIOLOGY REPORT*  Clinical Data: Acute renal insufficiency  RENAL/URINARY TRACT ULTRASOUND COMPLETE  Comparison:  CT 12/31/2012  Findings:  Right Kidney:  15 mm cyst right upper pole.  Negative for obstruction.  Renal cortex is normal.  Right kidney measures 9.8 cm in length.  Left Kidney:  9.8 cm in length.  Negative for obstruction.  Large upper pole cyst measures 5 x 6 cm.  Normal appearing renal cortex.  Gallbladder sludge and possible gallstones are noted.  Bladder:  Empty bladder.  IMPRESSION: Bilateral renal cysts.  Negative for hydronephrosis  Gallbladder sludge and possible gallstones.   Original Report Authenticated By: Janeece Riggers, M.D.    Dg Chest Port 1 View  01/10/2013  *RADIOLOGY REPORT*  Clinical Data: Shortness of breath.  History of edema.  PORTABLE CHEST - 1 VIEW  Comparison: Chest x-ray 01/08/2013.  Findings: There is a right-sided internal jugular central venous catheter with tip terminating in the mid superior vena cava. Previously noted left IJ catheter has been removed.  Lung volumes are slightly low.  Improved aeration throughout the left mid and upper lung, compatible with resolving airspace consolidation from pneumonia.  Bibasilar opacities (left much greater than right) compatible with residual areas of atelectasis and/or consolidation, likely a superimposed small left and trace right pleural  effusions. No evidence of pulmonary edema.  Heart size is within normal limits.  The patient is rotated to the left on today's exam, resulting in distortion of the mediastinal contours and reduced diagnostic sensitivity and specificity for mediastinal pathology. Atherosclerosis in the thoracic aorta.  IMPRESSION: 1.  Support apparatus, as above. 2.  Significantly improved aeration, particularly in the left mid to upper lung, favored to reflect resolving multilobar pneumonia. There are persistent bibasilar (left greater than right) opacities compatible with  significant residual areas of atelectasis and/or consolidation, as well as superimposed small left and trace right pleural effusions.   Original Report Authenticated By: Trudie Reed, M.D.    Dg Chest Port 1 View  01/08/2013  *RADIOLOGY REPORT*  Clinical Data: Dialysis catheter placement  PORTABLE CHEST - 1 VIEW  Comparison: Prior chest x-ray earlier today 01/08/2013 at 09:11 a.m.  Findings: Interval placement of a non tunneled hemodialysis catheter via a right internal jugular venous approach.  The catheter tip projects over the mid superior vena cava.  Unchanged position of left neck approach central venous catheter.  The tip again projects over the mediastinum given the patient is rotated position.  Negative for complicating pneumothorax.  Very similar appearance of the chest with right to left shift of the cardiac and mediastinal structures, bilateral layering effusions and bibasilar opacities greater on the left than the right with additional areas of focal consolidation in the periphery of the left upper lung.  IMPRESSION:  1.  New right IJ approach temporary hemodialysis catheter with the tip projecting over the mid superior vena cava. 2.  Negative for pneumothorax. 3.  Otherwise, similar appearance of the chest compared to earlier today.   Original Report Authenticated By: Malachy Moan, M.D.    Dg Chest Port 1 View  01/08/2013  *RADIOLOGY  REPORT*  Clinical Data: Evaluate for edema.  PORTABLE CHEST - 1 VIEW  Comparison: 01/05/2013  Findings: There are persistent opacities throughout the left hemithorax that are concerning for pleural and parenchymal disease. There may be dependent edema at the right lung bases which is unchanged.  The heart is obscured by the left lung densities. Again noted is a heavily calcified thoracic aorta.  The left jugular central venous catheter has an unusual course but this could be related to tortuosity of the left innominate vein. Suspect that the tip is near the junction of the left innominate vein and SVC.  Catheter position is unchanged from the previous examination.  Nasogastric tube has been removed.  IMPRESSION: Diffuse pleural and parenchymal densities in the left hemithorax have not significantly changed.  Persistent haziness at the right lung base could represent edema.  Left central venous catheter as described.   Original Report Authenticated By: Richarda Overlie, M.D.    Dg Chest Port 1 View  01/05/2013  *RADIOLOGY REPORT*  Clinical Data: Hypertension, atelectasis.  PORTABLE CHEST - 1 VIEW  Comparison:   the previous day's study  Findings: Nasogastric tube and left IJ central line are stable in position.  Atheromatous aorta.  Heart size normal.  Small left pleural effusion.  Persistent airspace consolidation in the left lower lung.  Patchy alveolar opacities in the left upper lung. Moderate interstitial opacities throughout the right lung. Regional bones unremarkable.  IMPRESSION: 1. Stable asymmetric infiltrates left greater than right. 2. Support hardware stable in position.   Original Report Authenticated By: D. Andria Rhein, MD    Dg Chest Port 1 View  01/04/2013  *RADIOLOGY REPORT*  Clinical Data: Atelectasis, follow-up  PORTABLE CHEST - 1 VIEW  Comparison: Portable chest x-ray of 01/03/2013  Findings: The lungs are not quite as well aerated.  Diffuse airspace disease remains.  There may be small effusions  present. Cardiomegaly is stable.  Endotracheal tube is no longer seen.  The left central venous line is unchanged in position and an NG tube extends below the hemidiaphragm.  IMPRESSION: Little change in poor aeration with diffuse airspace disease. Endotracheal tube is no longer seen.   Original Report  Authenticated By: Dwyane Dee, M.D.    Dg Chest Port 1 View  01/03/2013  *RADIOLOGY REPORT*  Clinical Data: Follow-up atelectasis and pneumonia.  PORTABLE CHEST - 1 VIEW  Comparison: 01/02/2013  Findings: Endotracheal tube tip measures 5 cm above the carina. Enteric tube tip is at the field of view but below the left hemidiaphragm.  Left central venous catheter tip is directed horizontally in the mediastinum consistent location of the junction of the brachial cephalic and superior vena cava.  Shallow inspiration.  Borderline heart size and pulmonary vascularity. Mild perihilar infiltration.  Bilateral basilar atelectasis or infiltration.  Obscuration of the left hemidiaphragm suggest small effusion.  No pneumothorax.  Calcification of the aorta.  IMPRESSION: Increasing perihilar and basilar infiltration or atelectasis. Small left pleural effusion.  Changes may be due to increase congestion or pneumonia.   Original Report Authenticated By: Burman Nieves, M.D.    Dg Chest Port 1 View  01/02/2013  *RADIOLOGY REPORT*  Clinical Data: Atelectasis.  Ventilator patient.  Respiratory distress.  PORTABLE CHEST - 1 VIEW  Comparison: 01/01/2013.  Findings: Unchanged ET tube, central venous catheter, and nasogastric tube.  Dense retrocardiac consolidation persists, suggesting left lower lobe atelectasis with or without superimposed pneumonia.  No definite left effusion.  No pneumothorax.  Calcified tortuous aorta.  IMPRESSION: Stable chest.   Original Report Authenticated By: Davonna Belling, M.D.    Dg Chest Port 1 View  01/01/2013  *RADIOLOGY REPORT*  Clinical Data: Central line placement.  PORTABLE CHEST - 1 VIEW   Comparison: 12/31/2012 and 11/01/2010.  Findings: Left central line tip at the level of the proximal to mid superior vena cava.  No gross pneumothorax.  Mild asymmetric apical pleural thickening unchanged from remote exams.  Endotracheal tube tip 4.7 cm above the carina.  Nasogastric tube courses below the diaphragm.  The tip is not included on this exam.  Consolidation in the left lung base may represent atelectasis as there is mild mediastinal shift to the left.  Underlying infiltrate or mass not entirely excluded.  Attention to this on follow-up.  Pulmonary vascular prominence.  Calcified mildly tortuous aorta.  IMPRESSION: Left central line tip at the level of the proximal to mid superior vena cava.  Consolidation in the left lung base may represent atelectasis as there is mild mediastinal shift to the left.  .  Pulmonary vascular prominence.  Calcified mildly tortuous aorta.  This is a call report.   Original Report Authenticated By: Lacy Duverney, M.D.    Dg Chest Port 1 View  12/31/2012  *RADIOLOGY REPORT*  Clinical Data: Shortness of breath  PORTABLE CHEST - 1 VIEW  Comparison: 11/01/2010  Findings: 1900 hours.  Low volume film with elevation of the left hemidiaphragm and retrocardiac collapse / consolidation.  There is some minimal probable atelectasis at the right base. Telemetry leads overlie the chest.  IMPRESSION: Low volume film with left base collapse / consolidation.   Original Report Authenticated By: Kennith Center, M.D.     Microbiology: No results found for this or any previous visit (from the past 240 hour(s)).   Labs: Basic Metabolic Panel:  Recent Labs Lab 01/23/13 0500 01/24/13 0356 01/25/13 0500 01/26/13 0505 01/27/13 0600 01/28/13 0605  NA 131* 134* 133* 133* 134* 133*  K 4.1 3.9 3.4* 3.9 4.0 4.2  CL 92* 96 94* 98 99 99  CO2 19 19 19 19 19 20   GLUCOSE 135* 127* 105* 113* 106* 122*  BUN 101* 106* 102* 103* 105* 101*  CREATININE  5.98* 5.75* 5.21* 4.74* 4.46* 4.12*   CALCIUM 8.8 9.1 9.2 8.9 8.9 9.3  MG 1.6  --   --   --   --   --   PHOS 9.3* 9.1* 8.4* 7.4* 7.1* 6.6*   Liver Function Tests:  Recent Labs Lab 01/24/13 0356 01/25/13 0500 01/26/13 0505 01/27/13 0600 01/28/13 0605  ALBUMIN 2.3* 2.2* 2.2* 2.2* 2.4*   No results found for this basename: LIPASE, AMYLASE,  in the last 168 hours No results found for this basename: AMMONIA,  in the last 168 hours CBC:  Recent Labs Lab 01/23/13 0500 01/24/13 0356 01/25/13 0500 01/26/13 0455 01/27/13 0600  WBC 7.1 6.9 5.3 5.4 6.4  HGB 8.6* 8.0* 7.3* 7.1* 8.5*  HCT 24.0* 22.9* 20.5* 20.1* 24.0*  MCV 80.0 80.9 80.7 81.0 80.3  PLT 196 186 179 172 163   Cardiac Enzymes: No results found for this basename: CKTOTAL, CKMB, CKMBINDEX, TROPONINI,  in the last 168 hours BNP: BNP (last 3 results)  Recent Labs  12/31/12 1940 01/06/13 0500 01/07/13 0830  PROBNP 3133.0* 6137.0* 6067.0*   CBG:  Recent Labs Lab 01/27/13 0608 01/27/13 1155 01/27/13 1641 01/27/13 2208 01/28/13 0739  GLUCAP 109* 152* 101* 118* 123*       Signed:  Kerrion Kemppainen  [facility] 01/28/2013, 10:39 AM

## 2013-01-28 NOTE — Clinical Social Work Note (Addendum)
CSW received a call from niece who stated she just completed the paperwork for pt to go to [place].  CSW is awaiting the authorization from [place].    CSW received a call from [place] at [facility] requesting updating clinicals in order to project firm commitment of receiving pt.  Electronically sent updated clinicals.    CSW received a call from [facility] requesting to assess the pt.  CSW approved this request per pt and family request.  Vickii Penna, LCSWA [phone]  Clinical Social Work

## 2013-01-28 NOTE — Progress Notes (Signed)
Physical Therapy Treatment Patient Details Name: [name] MRN: [id number] DOB: 1929/02/27 Today's Date: 01/28/2013 Time: 4098-1191 PT Time Calculation (min): 24 min  PT Assessment / Plan / Recommendation Comments on Treatment Session       Follow Up Recommendations        Does the patient have the potential to tolerate intense rehabilitation     Barriers to Discharge        Equipment Recommendations       Recommendations for Other Services    Frequency     Plan      Precautions / Restrictions Precautions Precautions: Fall   Pertinent Vitals/Pain no apparent distress     Mobility  Bed Mobility Bed Mobility: Supine to Sit;Sitting - Scoot to Edge of Bed Supine to Sit: 4: Min guard Sitting - Scoot to Edge of Bed: 4: Min guard;With rail Details for Bed Mobility Assistance: Slow moving, but needign less assist Transfers Transfers: Sit to Stand;Stand to Sit Sit to Stand: 4: Min guard;With upper extremity assist;With armrests;From chair/3-in-1;From toilet Stand to Sit: 4: Min guard;With upper extremity assist;With armrests;To bed;To toilet Details for Transfer Assistance: Pt was able to self-correct for appropriate hand placement and safety; close guard for safety Ambulation/Gait Ambulation/Gait Assistance: 4: Min guard (without physical contact) Ambulation Distance (Feet): 150 Feet Assistive device: Rolling walker Ambulation/Gait Assistance Details: Pt is with decr core strength/stability, lending to what looks like truncal ataxia with gait; Noted incr risk of losing balance with head turns Gait Pattern: Step-through pattern;Decreased stride length;Trunk rotated posteriorly on left;Trunk flexed    Exercises     PT Diagnosis:    PT Problem List:   PT Treatment Interventions:     PT Goals    Visit Information  Last PT Received On: 01/28/13 Assistance Needed: +1    Subjective Data  Subjective: Hoping to go to [place] today Patient Stated Goal: wants to go  to rehab   Cognition  Cognition Arousal/Alertness: Awake/alert Behavior During Therapy: [facility] for tasks assessed/performed Overall Cognitive Status: Within Functional Limits for tasks assessed    Balance     End of Session     GP     [name], [place] [phone]  01/28/2013, 2:53 PM

## 2013-01-28 NOTE — Clinical Social Work Note (Signed)
CSW paged MD to request signature on FL2. Gina Johnna Bollier, LCSWA [phone]  Clinical Social Work  

## 2013-01-29 ENCOUNTER — Non-Acute Institutional Stay (SKILLED_NURSING_FACILITY): Payer: Medicare Other | Admitting: Internal Medicine

## 2013-01-29 DIAGNOSIS — I150 Renovascular hypertension: Secondary | ICD-10-CM

## 2013-01-29 DIAGNOSIS — D631 Anemia in chronic kidney disease: Secondary | ICD-10-CM

## 2013-01-29 DIAGNOSIS — N039 Chronic nephritic syndrome with unspecified morphologic changes: Secondary | ICD-10-CM

## 2013-01-29 DIAGNOSIS — N189 Chronic kidney disease, unspecified: Secondary | ICD-10-CM

## 2013-01-29 DIAGNOSIS — K56609 Unspecified intestinal obstruction, unspecified as to partial versus complete obstruction: Secondary | ICD-10-CM

## 2013-02-12 ENCOUNTER — Non-Acute Institutional Stay (SKILLED_NURSING_FACILITY): Payer: Medicare Other | Admitting: Adult Health

## 2013-02-12 DIAGNOSIS — R05 Cough: Secondary | ICD-10-CM

## 2013-02-12 DIAGNOSIS — J189 Pneumonia, unspecified organism: Secondary | ICD-10-CM

## 2013-02-14 DIAGNOSIS — N189 Chronic kidney disease, unspecified: Secondary | ICD-10-CM | POA: Insufficient documentation

## 2013-02-14 DIAGNOSIS — D631 Anemia in chronic kidney disease: Secondary | ICD-10-CM | POA: Insufficient documentation

## 2013-02-14 DIAGNOSIS — I150 Renovascular hypertension: Secondary | ICD-10-CM | POA: Insufficient documentation

## 2013-02-14 NOTE — Progress Notes (Signed)
Patient ID: [name], female   DOB: 01-21-1929, 79 y.o.   MRN: [id number]        HISTORY & PHYSICAL  DATE: 01/29/2013   FACILITY: [facility]  LEVEL OF CARE: SNF (31)  ALLERGIES:  Allergies  Allergen Reactions  . Diovan (Valsartan)   . Hctz (Hydrochlorothiazide)   . Tekamlo (Aliskiren-Amlodipine)   . Valturna (Aliskiren-Valsartan)     CHIEF COMPLAINT:  Manage small bowel obstruction, anemia of chronic kidney disease, and chronic kidney disease.   HISTORY OF PRESENT ILLNESS:  79 year-old, Caucasian female was hospitalized secondary to abdominal pain, nausea and vomiting.  She was diagnosed with a high-grade small bowel obstruction with pneumatosis and portal venous gas.  She underwent laparotomy with diagnosis for peritonitis.  Pathology showed phytobezoar.  Patient tolerated the procedure well.  After hospitalization, she is admitted to this facility for short-term rehabilitation.  She denies abdominal pain, nausea or vomiting.    CHRONIC KIDNEY DISEASE: The patient's chronic kidney disease remains stable.  Patient denies increasing lower extremity swelling or confusion. Last BUN and creatinine are:  BUN 101, creatinine 4.12.  ANEMIA: The patient required transfusion of 1 U of packed red blood cells postoperatively.  The anemia has been stable. The patient denies fatigue, melena or hematochezia. No complications from the medications currently being used.  Last hemoglobin was 8.5, MCV  80.3.    PAST MEDICAL HISTORY :  Past Medical History  Diagnosis Date  . Osteoporosis   . DVT (deep venous thrombosis)   . Factor V Leiden   . Hypertension   . Dysphonia     PAST SURGICAL HISTORY: Past Surgical History  Procedure Laterality Date  . Tonsillectomy    . Appendectomy    . Abdominal hysterectomy    . Laparotomy N/A 01/01/2013    Procedure: EXPLORATORY LAPAROTOMY removal of smal bowel foreign body;  Surgeon: Lodema Pilot, DO;  Location: [facility];  Service:  General;  Laterality: N/A;    SOCIAL HISTORY:  reports that she has quit smoking. She does not have any smokeless tobacco history on file. She reports that  drinks alcohol. She reports that she does not use illicit drugs.  FAMILY HISTORY: None  CURRENT MEDICATIONS: Reviewed per MAR  REVIEW OF SYSTEMS:  See HPI otherwise 14 point ROS is negative.  PHYSICAL EXAMINATION  VS:  T 97.3        P 80      RR 18      BP 131/70       POX 95% room air        WT (Lb)  GENERAL: no acute distress, normal body habitus SKIN: abdominal incision closed, warm & dry, no suspicious lesions or rashes, no excessive dryness EYES: conjunctivae normal, sclerae normal, normal eye lids MOUTH/THROAT: lips without lesions,no lesions in the mouth,tongue is without lesions,uvula elevates in midline NECK: supple, trachea midline, no neck masses, no thyroid tenderness, no thyromegaly LYMPHATICS: no LAN in the neck, no supraclavicular LAN RESPIRATORY: breathing is even & unlabored, BS CTAB CARDIAC: RRR, no murmur,no extra heart sounds, no edema GI:  ABDOMEN: abdomen soft, normal BS, no masses, no tenderness  LIVER/SPLEEN: no hepatomegaly, no splenomegaly MUSCULOSKELETAL: HEAD: normal to inspection & palpation BACK: no kyphosis, scoliosis or spinal processes tenderness EXTREMITIES: LEFT UPPER EXTREMITY: full range of motion, normal strength & tone RIGHT UPPER EXTREMITY:  full range of motion, normal strength & tone LEFT LOWER EXTREMITY:  full range of motion, normal strength &  tone RIGHT LOWER EXTREMITY:  full range of motion, normal strength & tone PSYCHIATRIC: the patient is alert & oriented to person, affect & behavior appropriate  LABS/RADIOLOGY: Hemoglobin A1C 6.2.   Glucose 122, BUN 101, creatinine 4.12, otherwise BMP normal.    Magnesium 1.6, phosphorus 6.6, albumin 2.4.   Hemoglobin 8.5, MCV 80.3, otherwise CBC normal.    Renal ultrasound showed bilateral renal cysts, negative for hydronephrosis.    Chest x-ray showed left retrocardiac opacity, presenting pneumonia with small effusion.    CT of the abdomen and pelvis showed pneumatosis involving the distal duodenum and proximal jejunum, ischemia.  Extensive portal venous gas in the liver.  High-grade distal small bowel obstruction.  Extensive bibasilar consolidation, left greater than right.    ASSESSMENT/PLAN:  Small bowel obstruction.  Status post surgery.  Continue rehabilitation.  Anemia of chronic kidney disease.  Reassess hemoglobin level.   Chronic kidney disease.  Reassess renal functions.    Renovascular hypertension.  Well controlled.    Pneumonia.  Treated.   Hyperglycemia.  Hemoglobin A1C normal.  Currently on NovoLog with meals.   Hyperlipidemia.  Continue Crestor.    Check CBC and BMP.   I have reviewed patient's medical records received at admission/from hospitalization.  CPT CODE: 28413

## 2013-02-26 ENCOUNTER — Non-Acute Institutional Stay (SKILLED_NURSING_FACILITY): Payer: Medicare Other | Admitting: Adult Health

## 2013-02-26 DIAGNOSIS — I10 Essential (primary) hypertension: Secondary | ICD-10-CM

## 2013-02-26 DIAGNOSIS — E785 Hyperlipidemia, unspecified: Secondary | ICD-10-CM

## 2013-02-26 DIAGNOSIS — N039 Chronic nephritic syndrome with unspecified morphologic changes: Secondary | ICD-10-CM

## 2013-02-26 DIAGNOSIS — E119 Type 2 diabetes mellitus without complications: Secondary | ICD-10-CM

## 2013-02-26 DIAGNOSIS — D631 Anemia in chronic kidney disease: Secondary | ICD-10-CM

## 2013-02-26 DIAGNOSIS — N189 Chronic kidney disease, unspecified: Secondary | ICD-10-CM

## 2013-03-05 DIAGNOSIS — M6281 Muscle weakness (generalized): Secondary | ICD-10-CM

## 2013-03-05 DIAGNOSIS — I4891 Unspecified atrial fibrillation: Secondary | ICD-10-CM

## 2013-03-05 DIAGNOSIS — D631 Anemia in chronic kidney disease: Secondary | ICD-10-CM

## 2013-03-05 DIAGNOSIS — R262 Difficulty in walking, not elsewhere classified: Secondary | ICD-10-CM

## 2013-03-05 DIAGNOSIS — N039 Chronic nephritic syndrome with unspecified morphologic changes: Secondary | ICD-10-CM

## 2013-03-19 ENCOUNTER — Encounter: Payer: Self-pay | Admitting: Adult Health

## 2013-03-19 DIAGNOSIS — J984 Other disorders of lung: Secondary | ICD-10-CM

## 2013-03-19 DIAGNOSIS — J189 Pneumonia, unspecified organism: Secondary | ICD-10-CM | POA: Insufficient documentation

## 2013-03-19 DIAGNOSIS — R05 Cough: Secondary | ICD-10-CM | POA: Insufficient documentation

## 2013-03-19 DIAGNOSIS — E785 Hyperlipidemia, unspecified: Secondary | ICD-10-CM | POA: Insufficient documentation

## 2013-03-19 DIAGNOSIS — E119 Type 2 diabetes mellitus without complications: Secondary | ICD-10-CM | POA: Insufficient documentation

## 2013-03-19 HISTORY — DX: Other disorders of lung: J98.4

## 2013-03-19 HISTORY — DX: Pneumonia, unspecified organism: J18.9

## 2013-03-19 NOTE — Progress Notes (Signed)
  Subjective:    Patient ID: [name], female    DOB: [birth year], 79 y.o.   MRN: [id number]  HPI This is a 79 year old female who has been noted to have productive cough with yellowish phlegm. Noted chest congestion and slight wheezing on left lung field. She had Temp= 99.7 last night. Chest x-ray showed pneumonitis.   Review of Systems  Constitutional: Positive for fever.  HENT: Negative.   Eyes: Negative.   Respiratory: Positive for cough and wheezing. Negative for shortness of breath.   Cardiovascular: Negative for chest pain and leg swelling.  Gastrointestinal: Negative.   Endocrine: Negative.   Genitourinary: Negative.   Neurological: Negative.   Hematological: Negative for adenopathy. Does not bruise/bleed easily.       Objective:   Physical Exam  Nursing note and vitals reviewed. Constitutional: She is oriented to person, place, and time. She appears well-developed and well-nourished.  HENT:  Head: Normocephalic and atraumatic.  Right Ear: External ear normal.  Left Ear: External ear normal.  Nose: Nose normal.  Mouth/Throat: Oropharynx is clear and moist.  Eyes: Conjunctivae and EOM are normal. Pupils are equal, round, and reactive to light.  Neck: Normal range of motion. Neck supple.  Cardiovascular: Normal rate, regular rhythm and normal heart sounds.   Pulmonary/Chest: Effort normal. No respiratory distress. She has wheezes. She has rales. She exhibits no tenderness.  Abdominal: Soft. Bowel sounds are normal.  Musculoskeletal: She exhibits no edema and no tenderness.  Neurological: She is alert and oriented to person, place, and time.  Skin: Skin is warm and dry.  Psychiatric: She has a normal mood and affect. Her behavior is normal. Judgment and thought content normal.    LABS: 02/11/13  Wbc 7.9  hgb 6.9  hct 20.6  hgbA1c 5.9 02/07/13  Wbc 7.3  hgb 7.0  hct 21.0  NA 138  K 4.1  Glucose 101  BUN 43  Creatinine 1.82  Calcium 8.4    Procedures: 02/12/13   Chest x-ray shows pneumonitis    Medications reviewed per [facility] Assessment & Plan:   Pneumonitis - start Levaquin 500 mg 1 tab PO Q D x 10 days  Cough - start Mucinex 600 mg 1 tab PO Q 12 hours x 2 weeks

## 2013-03-19 NOTE — Progress Notes (Signed)
  Subjective:    Patient ID: [name], female    DOB: August 03, 1929, 79 y.o.   MRN: [id number]  HPI  This is a 79 year old female who is for discharge home with Home health PT, OT, and Nursing. DME: Rolling walker due to unsteady gait. She has been admitted to [facility] on 01/28/13 from [facility] with principal diagnosis of small bowel obstruction S/P distal laparotomy (S/P removal of phytobezoar). She had acute on chronic renal failure for which she had hemodialysis x 3. She has been admitted for a short-term rehabilitation.       Review of Systems  Constitutional: Positive for fever.  HENT: Negative.   Eyes: Negative.   Respiratory: Positive for cough and wheezing. Negative for shortness of breath.   Cardiovascular: Negative for chest pain and leg swelling.  Gastrointestinal: Negative.   Endocrine: Negative.   Genitourinary: Negative.   Neurological: Negative.   Hematological: Negative for adenopathy. Does not bruise/bleed easily.       Objective:   Physical Exam  Nursing note and vitals reviewed. Constitutional: She is oriented to person, place, and time. She appears well-developed and well-nourished.  HENT:  Head: Normocephalic and atraumatic.  Right Ear: External ear normal.  Left Ear: External ear normal.  Nose: Nose normal.  Mouth/Throat: Oropharynx is clear and moist.  Eyes: Conjunctivae and EOM are normal. Pupils are equal, round, and reactive to light.  Neck: Normal range of motion. Neck supple.  Cardiovascular: Normal rate, regular rhythm and normal heart sounds.   Pulmonary/Chest: Effort normal. No respiratory distress. She has wheezes. She has rales. She exhibits no tenderness.  Abdominal: Soft. Bowel sounds are normal.  Musculoskeletal: She exhibits no edema and no tenderness.  Neurological: She is alert and oriented to person, place, and time.  Skin: Skin is warm and dry.  Psychiatric: She has a normal mood and affect. Her behavior is normal.  Judgment and thought content normal.    LABS: 02/19/13  Wbc 8.0  hgb 7.8  hct 23.5 02/11/13  Wbc 7.9  hgb 6.9  hct 20.6  hgbA1c 5.9 02/07/13  Wbc 7.3  hgb 7.0  hct 21.0  NA 138  K 4.1  Glucose 101  BUN 43  Creatinine 1.82  Calcium 8.4    Procedures: 02/12/13  Chest x-ray shows pneumonitis    Medications reviewed per [facility]   Assessment & Plan:   Hyperlipidemia - stable; continue Lipitor 20 mg 1 tab PO Q D  Diabetes mellitus - well-controlled; continue Humalog 5 units SQ TIDac for CBG >= 150  Anemia in chronic kidney disease(285.21) - stable  Chronic kidney disease, unspecified - stable  HTN (hypertension) - well-controlled; continue Norvasc 10 mg 1 tab PO Q D

## 2014-04-05 ENCOUNTER — Inpatient Hospital Stay (HOSPITAL_COMMUNITY)
Admission: EM | Admit: 2014-04-05 | Discharge: 2014-04-09 | DRG: 389 | Disposition: A | Discharge status: Home / Self Care | Payer: Medicare Other | Attending: Surgery | Admitting: Surgery

## 2014-04-05 ENCOUNTER — Emergency Department (HOSPITAL_COMMUNITY): Payer: Medicare Other

## 2014-04-05 ENCOUNTER — Encounter (HOSPITAL_COMMUNITY): Payer: Self-pay | Admitting: Emergency Medicine

## 2014-04-05 DIAGNOSIS — D6859 Other primary thrombophilia: Secondary | ICD-10-CM | POA: Diagnosis present

## 2014-04-05 DIAGNOSIS — I129 Hypertensive chronic kidney disease with stage 1 through stage 4 chronic kidney disease, or unspecified chronic kidney disease: Secondary | ICD-10-CM | POA: Diagnosis present

## 2014-04-05 DIAGNOSIS — M810 Age-related osteoporosis without current pathological fracture: Secondary | ICD-10-CM | POA: Diagnosis present

## 2014-04-05 DIAGNOSIS — E876 Hypokalemia: Secondary | ICD-10-CM | POA: Diagnosis present

## 2014-04-05 DIAGNOSIS — E119 Type 2 diabetes mellitus without complications: Secondary | ICD-10-CM | POA: Diagnosis present

## 2014-04-05 DIAGNOSIS — I10 Essential (primary) hypertension: Secondary | ICD-10-CM | POA: Diagnosis present

## 2014-04-05 DIAGNOSIS — K56609 Unspecified intestinal obstruction, unspecified as to partial versus complete obstruction: Secondary | ICD-10-CM | POA: Diagnosis present

## 2014-04-05 DIAGNOSIS — Z86718 Personal history of other venous thrombosis and embolism: Secondary | ICD-10-CM

## 2014-04-05 DIAGNOSIS — K5669 Other intestinal obstruction: Principal | ICD-10-CM | POA: Diagnosis present

## 2014-04-05 DIAGNOSIS — D6851 Activated protein C resistance: Secondary | ICD-10-CM | POA: Diagnosis present

## 2014-04-05 DIAGNOSIS — D649 Anemia, unspecified: Secondary | ICD-10-CM | POA: Diagnosis present

## 2014-04-05 DIAGNOSIS — N289 Disorder of kidney and ureter, unspecified: Secondary | ICD-10-CM | POA: Diagnosis present

## 2014-04-05 DIAGNOSIS — N189 Chronic kidney disease, unspecified: Secondary | ICD-10-CM | POA: Diagnosis present

## 2014-04-05 DIAGNOSIS — Z87891 Personal history of nicotine dependence: Secondary | ICD-10-CM

## 2014-04-05 DIAGNOSIS — D696 Thrombocytopenia, unspecified: Secondary | ICD-10-CM | POA: Diagnosis present

## 2014-04-05 HISTORY — DX: Pneumonia, unspecified organism: J18.9

## 2014-04-05 LAB — COMPREHENSIVE METABOLIC PANEL
ALBUMIN: 4.6 g/dL (ref 3.5–5.2)
ALT: 23 U/L (ref 0–35)
ANION GAP: 18 — AB (ref 5–15)
AST: 27 U/L (ref 0–37)
Alkaline Phosphatase: 100 U/L (ref 39–117)
BILIRUBIN TOTAL: 0.9 mg/dL (ref 0.3–1.2)
BUN: 38 mg/dL — AB (ref 6–23)
CALCIUM: 11 mg/dL — AB (ref 8.4–10.5)
CO2: 29 mEq/L (ref 19–32)
CREATININE: 1.8 mg/dL — AB (ref 0.50–1.10)
Chloride: 92 mEq/L — ABNORMAL LOW (ref 96–112)
GFR calc Af Amer: 29 mL/min — ABNORMAL LOW (ref >90)
GFR calc non Af Amer: 25 mL/min — ABNORMAL LOW (ref >90)
Glucose, Bld: 176 mg/dL — ABNORMAL HIGH (ref 70–99)
Potassium: 4.4 mEq/L (ref 3.7–5.3)
Sodium: 139 mEq/L (ref 137–147)
TOTAL PROTEIN: 8.2 g/dL (ref 6.0–8.3)

## 2014-04-05 LAB — CBC
HEMATOCRIT: 45.4 % (ref 36.0–46.0)
Hemoglobin: 15.2 g/dL — ABNORMAL HIGH (ref 12.0–15.0)
MCH: 30.2 pg (ref 26.0–34.0)
MCHC: 33.5 g/dL (ref 30.0–36.0)
MCV: 90.1 fL (ref 78.0–100.0)
PLATELETS: 138 10*3/uL — AB (ref 150–400)
RBC: 5.04 MIL/uL (ref 3.87–5.11)
RDW: 14.3 % (ref 11.5–15.5)
WBC: 12.5 10*3/uL — AB (ref 4.0–10.5)

## 2014-04-05 LAB — LIPASE, BLOOD: LIPASE: 64 U/L — AB (ref 11–59)

## 2014-04-05 MED ORDER — IOHEXOL 300 MG/ML  SOLN
50.0000 mL | Freq: Once | INTRAMUSCULAR | Status: AC | PRN
  Administered 2014-04-05: 50 mL via ORAL

## 2014-04-05 MED ORDER — ONDANSETRON HCL 4 MG/2ML IJ SOLN
4.0000 mg | Freq: Once | INTRAMUSCULAR | Status: AC
  Administered 2014-04-05: 4 mg via INTRAVENOUS
  Filled 2014-04-05: qty 2

## 2014-04-05 MED ORDER — SODIUM CHLORIDE 0.9 % IV BOLUS (SEPSIS)
500.0000 mL | Freq: Once | INTRAVENOUS | Status: AC
  Administered 2014-04-05: 500 mL via INTRAVENOUS

## 2014-04-05 NOTE — ED Notes (Signed)
Pt was not able to urinate.  Will try again in 30 minutes.

## 2014-04-05 NOTE — ED Provider Notes (Signed)
CSN: [id number]     Arrival date & time 04/05/14  2022 History   First MD Initiated Contact with Patient 04/05/14 2051     Chief Complaint  Patient presents with  . Emesis     (Consider location/radiation/quality/duration/timing/severity/associated sxs/prior Treatment) HPI Pt presenting with c/o 3 episodes of emesis today.  She states that her symptoms feel similar to when she had a bowel obstruction approx one year ago.  She states that she had a hot dog for lunch and afterwards felt nauseated, then had a large amount of emesis- nonbloody and nonbilious.  No diarrhea.  Her abdomen feels bloated, but she denies pain.  No fever/chills.  Denies dysuria.  She has not tried to drink anything since the emesis.  There are no other associated systemic symptoms, there are no other alleviating or modifying factors.   Past Medical History  Diagnosis Date  . Osteoporosis   . DVT (deep venous thrombosis)   . Factor V Leiden   . Hypertension   . Dysphonia    Past Surgical History  Procedure Laterality Date  . Tonsillectomy    . Appendectomy    . Abdominal hysterectomy    . Laparotomy N/A 01/01/2013    Procedure: EXPLORATORY LAPAROTOMY removal of smal bowel foreign body;  Surgeon: Madilyn Hook, DO;  Location: [facility];  Service: General;  Laterality: N/A;   History reviewed. No pertinent family history. History  Substance Use Topics  . Smoking status: Former Research scientist (life sciences)  . Smokeless tobacco: Not on file  . Alcohol Use: Yes     Comment: occasionally   OB History   Grav Para Term Preterm Abortions TAB SAB Ect Mult Living                 Review of Systems ROS reviewed and all otherwise negative except for mentioned in HPI    Allergies  Diovan; Hctz; Tekamlo; and Valturna  Home Medications   Prior to Admission medications   Medication Sig Start Date End Date Taking? Authorizing Provider  acetaminophen (TYLENOL) 325 MG tablet Take 2 tablets (650 mg total) by mouth every 4 (four) hours as  needed. 01/21/13  Yes Marijean Heath, NP  amLODipine (NORVASC) 10 MG tablet Take 10 mg by mouth daily.   Yes Historical Provider, MD  aspirin EC 81 MG tablet Take 81 mg by mouth daily.   Yes Historical Provider, MD  calcium carbonate (TUMS - DOSED IN MG ELEMENTAL CALCIUM) 500 MG chewable tablet Chew 1-2 tablets (200-400 mg of elemental calcium total) by mouth 3 (three) times daily as needed. 01/21/13  Yes Marijean Heath, NP  irbesartan (AVAPRO) 300 MG tablet Take 300 mg by mouth daily.   Yes Historical Provider, MD  Multiple Vitamin (MULTIVITAMIN WITH MINERALS) TABS Take 1 tablet by mouth daily.   Yes Historical Provider, MD  rosuvastatin (CRESTOR) 5 MG tablet Take 5 mg by mouth daily.   Yes Historical Provider, MD   BP 128/90  Pulse 79  Temp(Src) 98.3 F (36.8 C) (Oral)  Resp 16  SpO2 95% Vitals reviewed Physical Exam Physical Examination: General appearance - alert, well appearing, and in no distress Mental status - alert, oriented to person, place, and time Eyes - no conjunctival injection,no scleral icterus Mouth - mucous membranes moist, pharynx normal without lesions Chest - clear to auscultation, no wheezes, rales or rhonchi, symmetric air entry Heart - normal rate, regular rhythm, normal S1, S2, no murmurs, rubs, clicks or gallops Abdomen - soft, nontender, distended, no  masses or organomegaly, nabs- somewhat high pitched, no gaurding or rebound tenderness Extremities - peripheral pulses normal, no pedal edema, no clubbing or cyanosis Skin - normal coloration and turgor, no rashes  ED Course  Procedures (including critical care time)  11:39 PM abdominal CT scan shows high grade small bowel obstruction- will place NG tube.  D/w Dr. Brantley Stage, surgery and he will see patient in the ED.    11:46 PM family and patient updated about findings and plan.  Pt is agreeable with starting NG tube.  Labs Review Labs Reviewed  CBC - Abnormal; Notable for the following:    WBC  12.5 (*)    Hemoglobin 15.2 (*)    Platelets 138 (*)    All other components within normal limits  COMPREHENSIVE METABOLIC PANEL - Abnormal; Notable for the following:    Chloride 92 (*)    Glucose, Bld 176 (*)    BUN 38 (*)    Creatinine, Ser 1.80 (*)    Calcium 11.0 (*)    GFR calc non Af Amer 25 (*)    GFR calc Af Amer 29 (*)    Anion gap 18 (*)    All other components within normal limits  LIPASE, BLOOD - Abnormal; Notable for the following:    Lipase 64 (*)    All other components within normal limits  URINALYSIS, ROUTINE W REFLEX MICROSCOPIC    Imaging Review Ct Abdomen Pelvis Wo Contrast  04/05/2014   CLINICAL DATA:  Abdominal pain.  EXAM: CT ABDOMEN AND PELVIS WITHOUT CONTRAST  TECHNIQUE: Multidetector CT imaging of the abdomen and pelvis was performed following the standard protocol without IV contrast.  COMPARISON:  12/31/2012  FINDINGS: BODY WALL: Unremarkable.  LOWER CHEST: Small pericardial effusion. The volume of fluid is likely stable when accounting for redistribution. Gastroesophageal reflux.  ABDOMEN/PELVIS:  Liver: Mild granulomatous changes.  No focal abnormality.  Biliary: Calcified gallstone. No gallbladder distention or inflammation. There is chronic mild common bile duct enlargement, without visible cause.  Pancreas: Unremarkable.  Spleen: Unremarkable.  Adrenals: Unremarkable.  Kidneys and ureters: Bilateral renal cysts, the largest on the left measuring 6.7 cm. Newly dense 11 mm lesion in the interpolar left kidney, temporality favoring hemorrhagic cyst. There is a 1 cm high-density lesion in the lower pole right kidney, cystic by recent renal ultrasound.  Bladder: Unremarkable.  Reproductive: Status post hysterectomy. Two dominant right ovarian/ adnexal cysts , measuring up to 3 cm. These are size stable from prior. A left adnexal cyst measures 17 mm.  Bowel: High-grade mid to proximal small bowel obstruction with abrupt transition from dilated fluid-filled bowel to  decompressed small bowel. No pneumatosis or pneumoperitoneum. There is mild congestive changes within the mesenteric. Extensive colonic diverticulosis. No pericecal inflammation.  Retroperitoneum: No mass or adenopathy.  Peritoneum: No ascites or pneumoperitoneum.  Vascular: Diffuse atherosclerosis with 2.5 cm distal aortic aneurysm.  OSSEOUS: No acute abnormalities.  IMPRESSION: 1. High-grade proximal small bowel obstruction, likely from adhesive disease. No evidence of bowel necrosis. 2. Bilateral adnexal cysts, up to 3 cm on the right. Recommend follow-up ultrasound in 6 months. 3. Chronic findings are stable from prior and noted above.   Electronically Signed   By: Jorje Guild M.D.   On: 04/05/2014 23:14     EKG Interpretation   Date/Time:  Saturday April 05 2014 21:22:54 EDT Ventricular Rate:  86 PR Interval:  159 QRS Duration: 98 QT Interval:  375 QTC Calculation: 448 R Axis:   22 Text Interpretation:  Sinus rhythm Borderline low voltage, extremity leads  Minimal ST depression, inferior leads No significant change since last  tracing Confirmed by [facility]  MD, Lexington Devine [phone]) on 04/05/2014 11:13:04 PM      MDM   Final diagnoses:  Small bowel obstruction  Renal insufficiency    Pt presenting with c/o nausea and vomiting.  Pt treated with IV nausea meds, IV fluids.  Abdominal CT scan shows high grade small bowel obstruction.  I have d/w Dr. Brantley Stage who will see patient in the ED.  Pt remains NPO, her nausea is improved.  Placing NG tube.      Threasa Beards, MD 04/05/14 2350

## 2014-04-05 NOTE — ED Notes (Signed)
Pt ambulated to the restroom but was not able to urinate.   Informed RN.

## 2014-04-05 NOTE — ED Notes (Signed)
Pt arrived to the ED with a complaint of emesis. Pt states she had a hot dog for lunch and had three episodes of emesis after.  Pt states she has a fear of being dehydrated.

## 2014-04-06 ENCOUNTER — Inpatient Hospital Stay (HOSPITAL_COMMUNITY): Payer: Medicare Other

## 2014-04-06 ENCOUNTER — Encounter (HOSPITAL_COMMUNITY): Payer: Self-pay | Admitting: *Deleted

## 2014-04-06 DIAGNOSIS — K565 Intestinal adhesions [bands], unspecified as to partial versus complete obstruction: Secondary | ICD-10-CM

## 2014-04-06 LAB — URINALYSIS, ROUTINE W REFLEX MICROSCOPIC
BILIRUBIN URINE: NEGATIVE
Glucose, UA: NEGATIVE mg/dL
Hgb urine dipstick: NEGATIVE
Ketones, ur: NEGATIVE mg/dL
NITRITE: NEGATIVE
PH: 7.5 (ref 5.0–8.0)
Protein, ur: 30 mg/dL — AB
SPECIFIC GRAVITY, URINE: 1.019 (ref 1.005–1.030)
UROBILINOGEN UA: 0.2 mg/dL (ref 0.0–1.0)

## 2014-04-06 LAB — URINE MICROSCOPIC-ADD ON

## 2014-04-06 LAB — COMPREHENSIVE METABOLIC PANEL
ALBUMIN: 3.6 g/dL (ref 3.5–5.2)
ALK PHOS: 82 U/L (ref 39–117)
ALT: 18 U/L (ref 0–35)
ANION GAP: 12 (ref 5–15)
AST: 24 U/L (ref 0–37)
BILIRUBIN TOTAL: 0.6 mg/dL (ref 0.3–1.2)
BUN: 37 mg/dL — AB (ref 6–23)
CO2: 35 mEq/L — ABNORMAL HIGH (ref 19–32)
CREATININE: 1.61 mg/dL — AB (ref 0.50–1.10)
Calcium: 9.8 mg/dL (ref 8.4–10.5)
Chloride: 94 mEq/L — ABNORMAL LOW (ref 96–112)
GFR calc non Af Amer: 28 mL/min — ABNORMAL LOW (ref >90)
GFR, EST AFRICAN AMERICAN: 33 mL/min — AB (ref >90)
GLUCOSE: 166 mg/dL — AB (ref 70–99)
POTASSIUM: 4.8 meq/L (ref 3.7–5.3)
Sodium: 141 mEq/L (ref 137–147)
TOTAL PROTEIN: 6.6 g/dL (ref 6.0–8.3)

## 2014-04-06 LAB — CBC
HCT: 41.9 % (ref 36.0–46.0)
Hemoglobin: 13.3 g/dL (ref 12.0–15.0)
MCH: 29 pg (ref 26.0–34.0)
MCHC: 31.7 g/dL (ref 30.0–36.0)
MCV: 91.3 fL (ref 78.0–100.0)
Platelets: 129 10*3/uL — ABNORMAL LOW (ref 150–400)
RBC: 4.59 MIL/uL (ref 3.87–5.11)
RDW: 14.4 % (ref 11.5–15.5)
WBC: 8.3 10*3/uL (ref 4.0–10.5)

## 2014-04-06 MED ORDER — ONDANSETRON HCL 4 MG/2ML IJ SOLN: 4.0000 mg | Freq: Four times a day (QID) | INTRAMUSCULAR | Status: DC | PRN

## 2014-04-06 MED ORDER — HYDRALAZINE HCL 20 MG/ML IJ SOLN
10.0000 mg | INTRAMUSCULAR | Status: DC | PRN
  Filled 2014-04-06: qty 0.5

## 2014-04-06 MED ORDER — MORPHINE SULFATE 2 MG/ML IJ SOLN: 2.0000 mg | INTRAMUSCULAR | Status: DC | PRN

## 2014-04-06 MED ORDER — PANTOPRAZOLE SODIUM 40 MG IV SOLR
40.0000 mg | Freq: Every day | INTRAVENOUS | Status: DC
  Administered 2014-04-06 – 2014-04-08 (×4): 40 mg via INTRAVENOUS
  Filled 2014-04-06 (×5): qty 40

## 2014-04-06 MED ORDER — BIOTENE DRY MOUTH MT LIQD
15.0000 mL | Freq: Two times a day (BID) | OROMUCOSAL | Status: DC
  Administered 2014-04-07 – 2014-04-08 (×4): 15 mL via OROMUCOSAL

## 2014-04-06 MED ORDER — ENOXAPARIN SODIUM 30 MG/0.3ML ~~LOC~~ SOLN
30.0000 mg | SUBCUTANEOUS | Status: DC
  Administered 2014-04-06 – 2014-04-09 (×4): 30 mg via SUBCUTANEOUS
  Filled 2014-04-06 (×4): qty 0.3

## 2014-04-06 MED ORDER — DEXTROSE-NACL 5-0.9 % IV SOLN
INTRAVENOUS | Status: DC
  Administered 2014-04-06 (×2): via INTRAVENOUS
  Administered 2014-04-06: 100 mL/h via INTRAVENOUS
  Administered 2014-04-07 – 2014-04-08 (×3): via INTRAVENOUS

## 2014-04-06 MED ORDER — CHLORHEXIDINE GLUCONATE 0.12 % MT SOLN
15.0000 mL | Freq: Two times a day (BID) | OROMUCOSAL | Status: DC
  Administered 2014-04-06 – 2014-04-09 (×6): 15 mL via OROMUCOSAL
  Filled 2014-04-06 (×8): qty 15

## 2014-04-06 MED ORDER — AMLODIPINE BESYLATE 10 MG PO TABS
10.0000 mg | ORAL_TABLET | Freq: Every day | ORAL | Status: DC
  Administered 2014-04-06 – 2014-04-09 (×4): 10 mg via ORAL
  Filled 2014-04-06 (×4): qty 1

## 2014-04-06 NOTE — ED Notes (Signed)
Pt's sister in law: Darcey Nora: [phone] Home   [phone] Cell  Joe: [phone] Cell

## 2014-04-06 NOTE — H&P (Signed)
[name] is an 80 y.o. female.   Chief Complaint: ABDOMINAL PAIN HPI: 5 hour hx of diffuse abdominal pain.  Here last year and had ex lap for SBO secondary to bezor.  Had BM today.  Having N/V this PM.  Pain at a 4.  Crampy / diffuse.    Past Medical History  Diagnosis Date  . Osteoporosis   . DVT (deep venous thrombosis)   . Factor V Leiden   . Hypertension   . Dysphonia     Past Surgical History  Procedure Laterality Date  . Tonsillectomy    . Appendectomy    . Abdominal hysterectomy    . Laparotomy N/A 01/01/2013    Procedure: EXPLORATORY LAPAROTOMY removal of smal bowel foreign body;  Surgeon: Madilyn Hook, DO;  Location: [facility];  Service: General;  Laterality: N/A;    History reviewed. No pertinent family history. Social History:  reports that she has quit smoking. She does not have any smokeless tobacco history on file. She reports that she drinks alcohol. She reports that she does not use illicit drugs.  Allergies:  Allergies  Allergen Reactions  . Diovan [Valsartan] Other (See Comments)    gas  . Hctz [Hydrochlorothiazide]     gas  . Tekamlo [Aliskiren-Amlodipine]     gas  . Valturna [Aliskiren-Valsartan]     Gas      (Not in a hospital admission)  Results for orders placed during the hospital encounter of 04/05/14 (from the past 48 hour(s))  CBC     Status: Abnormal   Collection Time    04/05/14  9:38 PM      Result Value Ref Range   WBC 12.5 (*) 4.0 - 10.5 K/uL   RBC 5.04  3.87 - 5.11 MIL/uL   Hemoglobin 15.2 (*) 12.0 - 15.0 g/dL   HCT 45.4  36.0 - 46.0 %   MCV 90.1  78.0 - 100.0 fL   MCH 30.2  26.0 - 34.0 pg   MCHC 33.5  30.0 - 36.0 g/dL   RDW 14.3  11.5 - 15.5 %   Platelets 138 (*) 150 - 400 K/uL  COMPREHENSIVE METABOLIC PANEL     Status: Abnormal   Collection Time    04/05/14  9:38 PM      Result Value Ref Range   Sodium 139  137 - 147 mEq/L   Potassium 4.4  3.7 - 5.3 mEq/L   Chloride 92 (*) 96 - 112 mEq/L   CO2 29  19 - 32 mEq/L   Glucose, Bld 176 (*) 70 - 99 mg/dL   BUN 38 (*) 6 - 23 mg/dL   Creatinine, Ser 1.80 (*) 0.50 - 1.10 mg/dL   Calcium 11.0 (*) 8.4 - 10.5 mg/dL   Total Protein 8.2  6.0 - 8.3 g/dL   Albumin 4.6  3.5 - 5.2 g/dL   AST 27  0 - 37 U/L   ALT 23  0 - 35 U/L   Alkaline Phosphatase 100  39 - 117 U/L   Total Bilirubin 0.9  0.3 - 1.2 mg/dL   GFR calc non Af Amer 25 (*) >90 mL/min   GFR calc Af Amer 29 (*) >90 mL/min   Comment: (NOTE)     The eGFR has been calculated using the CKD EPI equation.     This calculation has not been validated in all clinical situations.     eGFR's persistently <90 mL/min signify possible Chronic Kidney     Disease.  Anion gap 18 (*) 5 - 15  LIPASE, BLOOD     Status: Abnormal   Collection Time    04/05/14  9:38 PM      Result Value Ref Range   Lipase 64 (*) 11 - 59 U/L   Ct Abdomen Pelvis Wo Contrast  04/05/2014   CLINICAL DATA:  Abdominal pain.  EXAM: CT ABDOMEN AND PELVIS WITHOUT CONTRAST  TECHNIQUE: Multidetector CT imaging of the abdomen and pelvis was performed following the standard protocol without IV contrast.  COMPARISON:  12/31/2012  FINDINGS: BODY WALL: Unremarkable.  LOWER CHEST: Small pericardial effusion. The volume of fluid is likely stable when accounting for redistribution. Gastroesophageal reflux.  ABDOMEN/PELVIS:  Liver: Mild granulomatous changes.  No focal abnormality.  Biliary: Calcified gallstone. No gallbladder distention or inflammation. There is chronic mild common bile duct enlargement, without visible cause.  Pancreas: Unremarkable.  Spleen: Unremarkable.  Adrenals: Unremarkable.  Kidneys and ureters: Bilateral renal cysts, the largest on the left measuring 6.7 cm. Newly dense 11 mm lesion in the interpolar left kidney, temporality favoring hemorrhagic cyst. There is a 1 cm high-density lesion in the lower pole right kidney, cystic by recent renal ultrasound.  Bladder: Unremarkable.  Reproductive: Status post hysterectomy. Two dominant right  ovarian/ adnexal cysts , measuring up to 3 cm. These are size stable from prior. A left adnexal cyst measures 17 mm.  Bowel: High-grade mid to proximal small bowel obstruction with abrupt transition from dilated fluid-filled bowel to decompressed small bowel. No pneumatosis or pneumoperitoneum. There is mild congestive changes within the mesenteric. Extensive colonic diverticulosis. No pericecal inflammation.  Retroperitoneum: No mass or adenopathy.  Peritoneum: No ascites or pneumoperitoneum.  Vascular: Diffuse atherosclerosis with 2.5 cm distal aortic aneurysm.  OSSEOUS: No acute abnormalities.  IMPRESSION: 1. High-grade proximal small bowel obstruction, likely from adhesive disease. No evidence of bowel necrosis. 2. Bilateral adnexal cysts, up to 3 cm on the right. Recommend follow-up ultrasound in 6 months. 3. Chronic findings are stable from prior and noted above.   Electronically Signed   By: Jorje Guild M.D.   On: 04/05/2014 23:14    Review of Systems  Constitutional: Negative.   HENT: Negative.   Eyes: Negative.   Respiratory: Negative.   Cardiovascular: Negative.   Gastrointestinal: Positive for nausea, vomiting and abdominal pain. Negative for blood in stool.  Genitourinary: Negative.   Musculoskeletal: Negative.   Skin: Negative.   Neurological: Negative.     Blood pressure 128/90, pulse 79, temperature 98.3 F (36.8 C), temperature source Oral, resp. rate 16, SpO2 95.00%. Physical Exam  Constitutional: She appears well-developed and well-nourished.  HENT:  Head: Normocephalic and atraumatic.  Eyes: Pupils are equal, round, and reactive to light. No scleral icterus.  Neck: Normal range of motion.  Cardiovascular: Normal rate.   Respiratory: Effort normal and breath sounds normal.  GI: She exhibits distension. She exhibits no mass. There is tenderness. There is no rebound and no guarding.  Musculoskeletal: Normal range of motion.  Neurological: She is alert.  Skin: Skin is  warm and dry.     Assessment/Plan SBO Mild CRI  HTN HX EX LAP ADMIT IVF NGT REPEAT LABS / FILMS IN AM  [name] A. 04/06/2014, 12:22 AM

## 2014-04-06 NOTE — Progress Notes (Signed)
Subjective: Feels better, no n/v, no flatus/bm  Objective: Vital signs in last 24 hours: Temp:  [97.9 F (36.6 C)-98.3 F (36.8 C)] 98 F (36.7 C) (07/05 0531) Pulse Rate:  [79-88] 83 (07/05 0531) Resp:  [16-18] 16 (07/05 0531) BP: (127-152)/(73-90) 127/73 mmHg (07/05 0531) SpO2:  [89 %-95 %] 91 % (07/05 0531) Weight:  [136 lb 7.4 oz (61.9 kg)] 136 lb 7.4 oz (61.9 kg) (07/05 0142) Last BM Date: 04/05/14  Intake/Output from previous day: 07/04 0701 - 07/05 0700 In: 30 [NG/GT:30] Out: 2200 [Emesis/NG output:2200] Intake/Output this shift:    General appearance: no distress Resp: clear to auscultation bilaterally Cardio: regular rate and rhythm GI: soft mild distended well healed incision some bs present  Lab Results:   Recent Labs  04/05/14 2138 04/06/14 0500  WBC 12.5* 8.3  HGB 15.2* 13.3  HCT 45.4 41.9  PLT 138* 129*   BMET  Recent Labs  04/05/14 2138 04/06/14 0500  NA 139 141  K 4.4 4.8  CL 92* 94*  CO2 29 35*  GLUCOSE 176* 166*  BUN 38* 37*  CREATININE 1.80* 1.61*  CALCIUM 11.0* 9.8   PT/INR No results found for this basename: LABPROT, INR,  in the last 72 hours ABG No results found for this basename: PHART, PCO2, PO2, HCO3,  in the last 72 hours  Studies/Results: Ct Abdomen Pelvis Wo Contrast  04/05/2014   CLINICAL DATA:  Abdominal pain.  EXAM: CT ABDOMEN AND PELVIS WITHOUT CONTRAST  TECHNIQUE: Multidetector CT imaging of the abdomen and pelvis was performed following the standard protocol without IV contrast.  COMPARISON:  12/31/2012  FINDINGS: BODY WALL: Unremarkable.  LOWER CHEST: Small pericardial effusion. The volume of fluid is likely stable when accounting for redistribution. Gastroesophageal reflux.  ABDOMEN/PELVIS:  Liver: Mild granulomatous changes.  No focal abnormality.  Biliary: Calcified gallstone. No gallbladder distention or inflammation. There is chronic mild common bile duct enlargement, without visible cause.  Pancreas:  Unremarkable.  Spleen: Unremarkable.  Adrenals: Unremarkable.  Kidneys and ureters: Bilateral renal cysts, the largest on the left measuring 6.7 cm. Newly dense 11 mm lesion in the interpolar left kidney, temporality favoring hemorrhagic cyst. There is a 1 cm high-density lesion in the lower pole right kidney, cystic by recent renal ultrasound.  Bladder: Unremarkable.  Reproductive: Status post hysterectomy. Two dominant right ovarian/ adnexal cysts , measuring up to 3 cm. These are size stable from prior. A left adnexal cyst measures 17 mm.  Bowel: High-grade mid to proximal small bowel obstruction with abrupt transition from dilated fluid-filled bowel to decompressed small bowel. No pneumatosis or pneumoperitoneum. There is mild congestive changes within the mesenteric. Extensive colonic diverticulosis. No pericecal inflammation.  Retroperitoneum: No mass or adenopathy.  Peritoneum: No ascites or pneumoperitoneum.  Vascular: Diffuse atherosclerosis with 2.5 cm distal aortic aneurysm.  OSSEOUS: No acute abnormalities.  IMPRESSION: 1. High-grade proximal small bowel obstruction, likely from adhesive disease. No evidence of bowel necrosis. 2. Bilateral adnexal cysts, up to 3 cm on the right. Recommend follow-up ultrasound in 6 months. 3. Chronic findings are stable from prior and noted above.   Electronically Signed   By: Jorje Guild M.D.   On: 04/05/2014 23:14   Dg Abd 1 View  04/06/2014   CLINICAL DATA:  Followup small bowel of  EXAM: ABDOMEN - 1 VIEW  COMPARISON:  Abdominal CT from yesterday  FINDINGS: A nasogastric tube is in place, tip at the level of the distal stomach.  Significant improvement in the bowel gas  pattern: Previously administered oral contrast has progressed through the bowel. Colonic gas has increased and the number and degree of distended small bowel loops has decreased.  IMPRESSION: 1. Normalizing bowel gas pattern. 2. Nasogastric tube in good position.   Electronically Signed   By:  Jorje Guild M.D.   On: 04/06/2014 06:37     Assessment/Plan: SBO  Appears to be resolving but will leave ng in for now until passing flatus. Film and exam better but no bowel function yet.   Will restart her home norvasc for bp Potassium and cr a little elevated, will continue to hydrate and recheck in am tomorrow Lovenox, scds  [facility] 04/06/2014

## 2014-04-07 ENCOUNTER — Encounter (HOSPITAL_COMMUNITY): Payer: Self-pay | Admitting: Surgery

## 2014-04-07 DIAGNOSIS — D6851 Activated protein C resistance: Secondary | ICD-10-CM | POA: Diagnosis present

## 2014-04-07 LAB — BASIC METABOLIC PANEL
ANION GAP: 5 (ref 5–15)
BUN: 22 mg/dL (ref 6–23)
CALCIUM: 9 mg/dL (ref 8.4–10.5)
CO2: 35 meq/L — AB (ref 19–32)
Chloride: 104 mEq/L (ref 96–112)
Creatinine, Ser: 1.13 mg/dL — ABNORMAL HIGH (ref 0.50–1.10)
GFR calc non Af Amer: 44 mL/min — ABNORMAL LOW (ref >90)
GFR, EST AFRICAN AMERICAN: 51 mL/min — AB (ref >90)
Glucose, Bld: 130 mg/dL — ABNORMAL HIGH (ref 70–99)
Potassium: 4.3 mEq/L (ref 3.7–5.3)
Sodium: 144 mEq/L (ref 137–147)

## 2014-04-07 MED ORDER — BISACODYL 10 MG RE SUPP: 10.0000 mg | Freq: Two times a day (BID) | RECTAL | Status: DC | PRN

## 2014-04-07 NOTE — Progress Notes (Signed)
Feeling better  General: Pt awake/alert/oriented x4 in no major acute distress Eyes: PERRL, normal EOM. Sclera nonicteric Neuro: CN II-XII intact w/o focal sensory/motor deficits. Lymph: No head/neck/groin lymphadenopathy Psych:  No delerium/psychosis/paranoia HENT: Normocephalic, Mucus membranes moist.  No thrush.  NGT in place Neck: Supple, No tracheal deviation Chest: No pain.  Good respiratory excursion. CV:  Pulses intact.  Regular rhythm MS: Normal AROM mjr joints.  No obvious deformity Abdomen: Soft, Mod distended.  Nontender.  No incarcerated hernias. Ext:  SCDs BLE.  No significant edema.  No cyanosis Skin: No petechiae / purpura  Agree w NGT clamping trial Check Xrays in AM

## 2014-04-07 NOTE — Progress Notes (Signed)
Subjective: She want to know when she can go.  Not much recorded, from the NG, but she says she is on her 4 th cannister. NG functional and the cannister is full.  Passing flatus and says she had a BM this AM.   Objective: Vital signs in last 24 hours: Temp:  [98.2 F (36.8 C)-99 F (37.2 C)] 98.2 F (36.8 C) (07/06 0505) Pulse Rate:  [76-84] 80 (07/06 0505) Resp:  [16-20] 16 (07/06 0505) BP: (133-163)/(68-81) 163/81 mmHg (07/06 0505) SpO2:  [85 %-92 %] 90 % (07/06 0505) Last BM Date: 04/05/14 Nothing PO recorded 400 from NG recorded yesterday. 1 stool recorded Afebrile, BP up some Sats Low 90's. Creatinine is better. Film better yesterday Intake/Output from previous day: 07/05 0701 - 07/06 0700 In: 2855 [I.V.:2825; NG/GT:30] Out: 1551 [Urine:1150; Emesis/NG output:400; Stool:1] Intake/Output this shift:    General appearance: alert, cooperative and no distress Resp: clear to auscultation bilaterally GI: soft, she is still distended, no pain few BS, some flatus and +BM reported.  Lab Results:   Recent Labs  04/05/14 2138 04/06/14 0500  WBC 12.5* 8.3  HGB 15.2* 13.3  HCT 45.4 41.9  PLT 138* 129*    BMET  Recent Labs  04/06/14 0500 04/07/14 0533  NA 141 144  K 4.8 4.3  CL 94* 104  CO2 35* 35*  GLUCOSE 166* 130*  BUN 37* 22  CREATININE 1.61* 1.13*  CALCIUM 9.8 9.0   PT/INR No results found for this basename: LABPROT, INR,  in the last 72 hours   Recent Labs Lab 04/05/14 2138 04/06/14 0500  AST 27 24  ALT 23 18  ALKPHOS 100 82  BILITOT 0.9 0.6  PROT 8.2 6.6  ALBUMIN 4.6 3.6     Lipase     Component Value Date/Time   LIPASE 64* 04/05/2014 2138     Studies/Results: Ct Abdomen Pelvis Wo Contrast  04/05/2014   CLINICAL DATA:  Abdominal pain.  EXAM: CT ABDOMEN AND PELVIS WITHOUT CONTRAST  TECHNIQUE: Multidetector CT imaging of the abdomen and pelvis was performed following the standard protocol without IV contrast.  COMPARISON:  12/31/2012   FINDINGS: BODY WALL: Unremarkable.  LOWER CHEST: Small pericardial effusion. The volume of fluid is likely stable when accounting for redistribution. Gastroesophageal reflux.  ABDOMEN/PELVIS:  Liver: Mild granulomatous changes.  No focal abnormality.  Biliary: Calcified gallstone. No gallbladder distention or inflammation. There is chronic mild common bile duct enlargement, without visible cause.  Pancreas: Unremarkable.  Spleen: Unremarkable.  Adrenals: Unremarkable.  Kidneys and ureters: Bilateral renal cysts, the largest on the left measuring 6.7 cm. Newly dense 11 mm lesion in the interpolar left kidney, temporality favoring hemorrhagic cyst. There is a 1 cm high-density lesion in the lower pole right kidney, cystic by recent renal ultrasound.  Bladder: Unremarkable.  Reproductive: Status post hysterectomy. Two dominant right ovarian/ adnexal cysts , measuring up to 3 cm. These are size stable from prior. A left adnexal cyst measures 17 mm.  Bowel: High-grade mid to proximal small bowel obstruction with abrupt transition from dilated fluid-filled bowel to decompressed small bowel. No pneumatosis or pneumoperitoneum. There is mild congestive changes within the mesenteric. Extensive colonic diverticulosis. No pericecal inflammation.  Retroperitoneum: No mass or adenopathy.  Peritoneum: No ascites or pneumoperitoneum.  Vascular: Diffuse atherosclerosis with 2.5 cm distal aortic aneurysm.  OSSEOUS: No acute abnormalities.  IMPRESSION: 1. High-grade proximal small bowel obstruction, likely from adhesive disease. No evidence of bowel necrosis. 2. Bilateral adnexal cysts, up to 3  cm on the right. Recommend follow-up ultrasound in 6 months. 3. Chronic findings are stable from prior and noted above.   Electronically Signed   By: Jorje Guild M.D.   On: 04/05/2014 23:14   Dg Abd 1 View  04/06/2014   CLINICAL DATA:  Followup small bowel of  EXAM: ABDOMEN - 1 VIEW  COMPARISON:  Abdominal CT from yesterday  FINDINGS: A  nasogastric tube is in place, tip at the level of the distal stomach.  Significant improvement in the bowel gas pattern: Previously administered oral contrast has progressed through the bowel. Colonic gas has increased and the number and degree of distended small bowel loops has decreased.  IMPRESSION: 1. Normalizing bowel gas pattern. 2. Nasogastric tube in good position.   Electronically Signed   By: Jorje Guild M.D.   On: 04/06/2014 06:37    Medications: . amLODipine  10 mg Oral Daily  . antiseptic oral rinse  15 mL Mouth Rinse q12n4p  . chlorhexidine  15 mL Mouth Rinse BID  . enoxaparin (LOVENOX) injection  30 mg Subcutaneous Q24H  . pantoprazole (PROTONIX) IV  40 mg Intravenous QHS   . dextrose 5 % and 0.9% NaCl 100 mL/hr at 04/07/14 0609   Prior to Admission medications   Medication Sig Start Date End Date Taking? Authorizing Provider  acetaminophen (TYLENOL) 325 MG tablet Take 2 tablets (650 mg total) by mouth every 4 (four) hours as needed. 01/21/13  Yes Marijean Heath, NP  amLODipine (NORVASC) 10 MG tablet Take 10 mg by mouth daily.   Yes Historical Provider, MD  aspirin EC 81 MG tablet Take 81 mg by mouth daily.   Yes Historical Provider, MD  calcium carbonate (TUMS - DOSED IN MG ELEMENTAL CALCIUM) 500 MG chewable tablet Chew 1-2 tablets (200-400 mg of elemental calcium total) by mouth 3 (three) times daily as needed. 01/21/13  Yes Marijean Heath, NP  furosemide (LASIX) 20 MG tablet Take 10 mg by mouth daily.   Yes Historical Provider, MD  irbesartan (AVAPRO) 300 MG tablet Take 300 mg by mouth daily.   Yes Historical Provider, MD  Multiple Vitamin (MULTIVITAMIN WITH MINERALS) TABS Take 1 tablet by mouth daily.   Yes Historical Provider, MD  rosuvastatin (CRESTOR) 5 MG tablet Take 5 mg by mouth daily.   Yes Historical Provider, MD    Assessment/Plan SBO EXPLORATORY LAPAROTOMY removal of smal bowel foreign body,(Foreign body, removal, small bowel - FOREIGN MATERIAL,  CONSISTENT WITH FOOD MATERIAL.) Dr. Lilyan Punt, 01/01/13, Post op acute renal failure Post op acute respiratory failure PAF/amiodarone required Post op sepsis/pneumonia from aspiration/peritonitis Borderline AODM Anemia Thrombocytopenia/Factor V Leiden with hx of DVT    Plan:  Clamping trails and ice chips.  Ambulate.  She says they won't let her walk alone, and she was only able to get up and walk once yesterday. OOB to chair and walking qid or more today.  Recheck labs AM.  LOS: 2 days    Kaelan Amble 04/07/2014

## 2014-04-08 ENCOUNTER — Inpatient Hospital Stay (HOSPITAL_COMMUNITY): Payer: Medicare Other

## 2014-04-08 DIAGNOSIS — E876 Hypokalemia: Secondary | ICD-10-CM

## 2014-04-08 LAB — CBC
HEMATOCRIT: 36.8 % (ref 36.0–46.0)
HEMOGLOBIN: 11.6 g/dL — AB (ref 12.0–15.0)
MCH: 29.5 pg (ref 26.0–34.0)
MCHC: 31.5 g/dL (ref 30.0–36.0)
MCV: 93.6 fL (ref 78.0–100.0)
Platelets: 109 10*3/uL — ABNORMAL LOW (ref 150–400)
RBC: 3.93 MIL/uL (ref 3.87–5.11)
RDW: 14.2 % (ref 11.5–15.5)
WBC: 5.5 10*3/uL (ref 4.0–10.5)

## 2014-04-08 LAB — BASIC METABOLIC PANEL
Anion gap: 11 (ref 5–15)
BUN: 14 mg/dL (ref 6–23)
CALCIUM: 8.5 mg/dL (ref 8.4–10.5)
CHLORIDE: 105 meq/L (ref 96–112)
CO2: 28 meq/L (ref 19–32)
Creatinine, Ser: 0.9 mg/dL (ref 0.50–1.10)
GFR calc Af Amer: 67 mL/min — ABNORMAL LOW (ref >90)
GFR calc non Af Amer: 58 mL/min — ABNORMAL LOW (ref >90)
GLUCOSE: 132 mg/dL — AB (ref 70–99)
Potassium: 3.3 mEq/L — ABNORMAL LOW (ref 3.7–5.3)
SODIUM: 144 meq/L (ref 137–147)

## 2014-04-08 LAB — MAGNESIUM: Magnesium: 1.9 mg/dL (ref 1.5–2.5)

## 2014-04-08 MED ORDER — LORATADINE 10 MG PO TABS
10.0000 mg | ORAL_TABLET | Freq: Every day | ORAL | Status: DC
  Administered 2014-04-08 – 2014-04-09 (×2): 10 mg via ORAL
  Filled 2014-04-08 (×2): qty 1

## 2014-04-08 MED ORDER — IRBESARTAN 150 MG PO TABS: 150.0000 mg | ORAL_TABLET | Freq: Two times a day (BID) | ORAL | Status: DC

## 2014-04-08 MED ORDER — IRBESARTAN 150 MG PO TABS
150.0000 mg | ORAL_TABLET | Freq: Every day | ORAL | Status: DC
  Administered 2014-04-08 – 2014-04-09 (×2): 150 mg via ORAL
  Filled 2014-04-08 (×2): qty 1

## 2014-04-08 MED ORDER — POTASSIUM CHLORIDE CRYS ER 20 MEQ PO TBCR
20.0000 meq | EXTENDED_RELEASE_TABLET | Freq: Two times a day (BID) | ORAL | Status: DC
  Administered 2014-04-08 – 2014-04-09 (×3): 20 meq via ORAL
  Filled 2014-04-08 (×4): qty 1

## 2014-04-08 MED ORDER — VITAMINS A & D EX OINT
TOPICAL_OINTMENT | CUTANEOUS | Status: AC
  Administered 2014-04-08: 17:00:00
  Filled 2014-04-08: qty 5

## 2014-04-08 MED ORDER — POTASSIUM CHLORIDE IN NACL 20-0.9 MEQ/L-% IV SOLN
INTRAVENOUS | Status: DC
Start: 2014-04-08 — End: 2014-04-09
  Administered 2014-04-08 (×2): via INTRAVENOUS
  Filled 2014-04-08 (×3): qty 1000

## 2014-04-08 NOTE — Clinical Documentation Improvement (Signed)
  H&P states "CRI". In the Coding world "chronic renal insufficiency" is considered nonspecific and low weighted. Please clarify this term to better reflect severity of illness and risk of mortality. Thank you.  Possible Clinical Conditions?  - AKI - Acute Renal Failure - AKI improved  - Acute on Chronic Renal Failure - Other Condition  Supporting Information: - BUN/Cr:  7/4 38/1.80,  7/5 37/1.61,  7/6 22/1.13,  7/7 14/0.90 - GFR:  7/4 25/29,  7/5 28/33,  7/6 44/51,  7/7 58/67  Thank You, Ezekiel Ina ,RN Clinical Documentation Specialist:  [place] [employer]

## 2014-04-08 NOTE — Progress Notes (Signed)
  Subjective: Having gas and BM, feels better.  Walked about 6 times yesterday.  Objective: Vital signs in last 24 hours: Temp:  [98 F (36.7 C)-99.5 F (37.5 C)] 98.2 F (36.8 C) (07/07 0517) Pulse Rate:  [75-85] 75 (07/07 0517) Resp:  [16-18] 16 (07/07 0517) BP: (146-172)/(69-76) 146/76 mmHg (07/07 0517) SpO2:  [84 %-92 %] 92 % (07/07 0517) Last BM Date: 04/07/14 240 PO recorded 775 from the NG recorded 3 stools recorded. Afebrile BP up some, VSS K+3.3 WBC is normal Film pending Intake/Output from previous day: 07/06 0701 - 07/07 0700 In: 9024 [P.O.:240; I.V.:1155] Out: 1852 [Urine:1075; Emesis/NG output:775; Stool:2] Intake/Output this shift:    General appearance: alert, cooperative and no distress Resp: clear to auscultation bilaterally GI: soft, she still seems somewhat distended to me.  + BS, +BM  Lab Results:   Recent Labs  04/06/14 0500 04/08/14 0450  WBC 8.3 5.5  HGB 13.3 11.6*  HCT 41.9 36.8  PLT 129* 109*    BMET  Recent Labs  04/07/14 0533 04/08/14 0450  NA 144 144  K 4.3 3.3*  CL 104 105  CO2 35* 28  GLUCOSE 130* 132*  BUN 22 14  CREATININE 1.13* 0.90  CALCIUM 9.0 8.5   PT/INR No results found for this basename: LABPROT, INR,  in the last 72 hours   Recent Labs Lab 04/05/14 2138 04/06/14 0500  AST 27 24  ALT 23 18  ALKPHOS 100 82  BILITOT 0.9 0.6  PROT 8.2 6.6  ALBUMIN 4.6 3.6     Lipase     Component Value Date/Time   LIPASE 64* 04/05/2014 2138     Studies/Results: No results found.  Medications: . amLODipine  10 mg Oral Daily  . antiseptic oral rinse  15 mL Mouth Rinse q12n4p  . chlorhexidine  15 mL Mouth Rinse BID  . enoxaparin (LOVENOX) injection  30 mg Subcutaneous Q24H  . pantoprazole (PROTONIX) IV  40 mg Intravenous QHS    Assessment/Plan SBO  EXPLORATORY LAPAROTOMY removal of smal bowel foreign body,(Foreign body, removal, small bowel  - FOREIGN MATERIAL, CONSISTENT WITH FOOD MATERIAL.) Dr. Lilyan Punt,  01/01/13,  Post op acute renal failure Post op acute respiratory failure  PAF/amiodarone required  Post op sepsis/pneumonia from aspiration/peritonitis  Borderline AODM  Anemia  Thrombocytopenia/Factor V Leiden with hx of DVT Hypokalemia   Plan:  She feels much better, she says her abdomen is better.  Moving gas and stool.  We will clamp NG, continue to mobilize, give her some additional sips.  Add K+ and check her Mag.  If film is better and she continues to do well consider pulling NG after clamping for some time.  She is hesitant to take tube out to soon.  LOS: 3 days    [name] 04/08/2014

## 2014-04-08 NOTE — Progress Notes (Signed)
X-ray shows no obstruction.  Gas in colon  Abdomen mildly distended but much softer.  She feels better.  Agree NG clamping trials.  Possible removal of no nausea and tolerate clears with NG tube plan.  I updated the patient's status to the patient.  Recommendations were made.  Questions were answered.  The patient expressed understanding & appreciation.

## 2014-04-09 LAB — BASIC METABOLIC PANEL
Anion gap: 6 (ref 5–15)
BUN: 12 mg/dL (ref 6–23)
CHLORIDE: 105 meq/L (ref 96–112)
CO2: 27 mEq/L (ref 19–32)
Calcium: 8.4 mg/dL (ref 8.4–10.5)
Creatinine, Ser: 0.91 mg/dL (ref 0.50–1.10)
GFR calc Af Amer: 66 mL/min — ABNORMAL LOW (ref >90)
GFR calc non Af Amer: 57 mL/min — ABNORMAL LOW (ref >90)
GLUCOSE: 111 mg/dL — AB (ref 70–99)
POTASSIUM: 4.8 meq/L (ref 3.7–5.3)
Sodium: 138 mEq/L (ref 137–147)

## 2014-04-09 MED ORDER — IRBESARTAN 300 MG PO TABS: 300.0000 mg | ORAL_TABLET | Freq: Every day | ORAL | Status: DC

## 2014-04-09 NOTE — Care Management Note (Signed)
    Page 1 of 1   04/09/2014     2:02:01 PM CARE MANAGEMENT NOTE 04/09/2014  Patient:  [name], [name]   Account Number:  [id number]  Date Initiated:  04/09/2014  Documentation initiated by:  Sunday Spillers  Subjective/Objective Assessment:   80 yo female admitted with SBO. PTA lived at home alone.     Action/Plan:   Home when stable   Anticipated DC Date:  04/12/2014   Anticipated DC Plan:  [place]  CM consult      Choice offered to / List presented to:             Status of service:  Completed, signed off Medicare Important Message given?  YES (If response is "NO", the following Medicare IM given date fields will be blank) Date Medicare IM given:  04/09/2014 Medicare IM given by:  [facility] Date Additional Medicare IM given:   Additional Medicare IM given by:    Discharge Disposition:  HOME/SELF CARE  Per UR Regulation:  Reviewed for med. necessity/level of care/duration of stay  If discussed at [place] of Stay Meetings, dates discussed:    Comments:

## 2014-04-09 NOTE — Progress Notes (Signed)
Pt discharged home with son in stable condition. Discharge instructions given. Pt verbalized understanding.

## 2014-04-09 NOTE — Progress Notes (Signed)
Abdomen much softer. HAving inc flatus w BMs Agree with advancing diet.  D/C patient from hospital when patient meets criteria (anticipate in 1-2 day(s)):  Tolerating oral intake well Ambulating in walkways Adequate pain control without IV medications Urinating  Having flatus

## 2014-04-09 NOTE — Discharge Instructions (Signed)
Small Bowel Obstruction A small bowel obstruction is a blockage (obstruction) of the small intestine (small bowel). The small bowel is a long, slender tube that connects the stomach to the colon. Its job is to absorb nutrients from the fluids and foods you consume into the bloodstream.  CAUSES  There are many causes of intestinal blockage. The most common ones include:  Hernias. This is a more common cause in children than adults.  Inflammatory bowel disease (enteritis and colitis).  Twisting of the bowel (volvulus).  Tumors.  Scar tissue (adhesions) from previous surgery or radiation treatment.  Recent surgery. This may cause an acute small bowel obstruction called an ileus. SYMPTOMS   Abdominal pain. This may be dull cramps or sharp pain. It may occur in one area or may be present in the entire abdomen. Pain can range from mild to severe, depending on the degree of obstruction.  Nausea and vomiting. Vomit may be greenish or yellow bile color.  Distended or swollen stomach. Abdominal bloating is a common symptom.  Constipation.  Lack of passing gas.  Frequent belching.  Diarrhea. This may occur if runny stool is able to leak around the obstruction. DIAGNOSIS  Your caregiver can usually diagnose small bowel obstruction by taking a history, doing a physical exam, and taking X-rays. If the cause is unclear, a CT scan (computerized tomography) of your abdomen and pelvis may be needed. TREATMENT  Treatment of the blockage depends on the cause and how bad the problem is.   Sometimes, the obstruction improves with bed rest and intravenous (IV) fluids.  Resting the bowel is very important. This means following a simple diet. Sometimes, a clear liquid diet may be required for several days.  Sometimes, a small tube (nasogastric tube) is placed into the stomach to decompress the bowel. When the bowel is blocked, it usually swells up like a balloon filled with air and fluids.  Decompression means that the air and fluids are removed by suction through that tube. This can help with pain, discomfort, and nausea. It can also help the obstruction resolve faster.  Surgery may be required if other treatments do not work. Bowel obstruction from a hernia may require early surgery and can be an emergency procedure. Adhesions that cause frequent or severe obstructions may also require surgery. HOME CARE INSTRUCTIONS If your bowel obstruction is only partial or incomplete, you may be allowed to go home.  Get plenty of rest.  Follow your diet as directed by your caregiver.  Only consume clear liquids until your condition improves.  Avoid solid foods as instructed. SEEK IMMEDIATE MEDICAL CARE IF:  You have increased pain or cramping.  You vomit blood.  You have uncontrolled vomiting or nausea.  You cannot drink fluids due to vomiting or pain.  You develop confusion.  You begin feeling very dry or thirsty (dehydrated).  You have severe bloating.  You have chills.  You have a fever.  You feel extremely weak or you faint. MAKE SURE YOU:  Understand these instructions.  Will watch your condition.  Will get help right away if you are not doing well or get worse. Document Released: 12/06/2005 Document Revised: 12/12/2011 Document Reviewed: 12/03/2010 [facility] Patient Information 2015 [place], [place]. This information is not intended to replace advice given to you by your health care provider. Make sure you discuss any questions you have with your health care provider.  Low-Fiber Diet for about 1 week till your sure everything is back to normal. Fiber is found  in fruits, vegetables, and whole grains. A low-fiber diet restricts fibrous foods that are not digested in the small intestine. A diet containing about 10-15 grams of fiber per day is considered low fiber. Low-fiber diets may be used to:  Promote healing and rest the bowel during intestinal  flare-ups.  Prevent blockage of a partially obstructed or narrowed gastrointestinal tract.  Reduce fecal weight and volume.  Slow the movement of feces. You may be on a low-fiber diet as a transitional diet following surgery, after an injury (trauma), or because of a short (acute) or lifelong (chronic) illness. Your health care provider will determine the length of time you need to stay on this diet.  WHAT DO I NEED TO KNOW ABOUT A LOW-FIBER DIET? Always check the fiber content on the packaging's Nutrition Facts label, especially on foods from the grains list. Ask your dietitian if you have questions about specific foods that are related to your condition, especially if the food is not listed below. In general, a low-fiber food will have less than 2 g of fiber. WHAT FOODS CAN I EAT? Grains All breads and crackers made with white flour. Sweet rolls, doughnuts, waffles, pancakes, [place] toast, bagels. Pretzels, Melba toast, zwieback. Well-cooked cereals, such as cornmeal, farina, or cream cereals. Dry cereals that do not contain whole grains, fruit, or nuts, such as refined corn, wheat, rice, and oat cereals. Potatoes prepared any way without skins, plain pastas and noodles, refined white rice. Use white flour for baking and making sauces. Use allowed list of grains for casseroles, dumplings, and puddings.  Vegetables Strained tomato and vegetable juices. Fresh lettuce, cucumber, spinach. Well-cooked (no skin or pulp) or canned vegetables, such as asparagus, bean sprouts, beets, carrots, green beans, mushrooms, potatoes, pumpkin, spinach, yellow squash, tomato sauce/puree, turnips, yams, and zucchini. Keep servings limited to  cup.  Fruits All fruit juices except prune juice. Cooked or canned fruits without skin and seeds, such as applesauce, apricots, cherries, fruit cocktail, grapefruit, grapes, mandarin oranges, melons, peaches, pears, pineapple, and plums. Fresh fruits without skin, such as  apricots, avocados, bananas, melons, pineapple, nectarines, and peaches. Keep servings limited to  cup or 1 piece.  Meat and Other Protein Sources Ground or well-cooked tender beef, ham, veal, lamb, pork, or poultry. Eggs, plain cheese. Fish, oysters, shrimp, lobster, and other seafood. Liver, organ meats. Smooth nut butters. Dairy All milk products and alternative dairy substitutes, such as soy, rice, almond, and coconut, not containing added whole nuts, seeds, or added fruit. Beverages Decaf coffee, fruit, and vegetable juices or smoothies (small amounts, with no pulp or skins, and with fruits from allowed list), sports drinks, herbal tea. Condiments Ketchup, mustard, vinegar, cream sauce, cheese sauce, cocoa powder. Spices in moderation, such as allspice, basil, bay leaves, celery powder or leaves, cinnamon, cumin powder, curry powder, ginger, mace, marjoram, onion or garlic powder, oregano, paprika, parsley flakes, ground pepper, rosemary, sage, savory, tarragon, thyme, and turmeric. Sweets and Desserts Plain cakes and cookies, pie made with allowed fruit, pudding, custard, cream pie. Gelatin, fruit, ice, sherbet, frozen ice pops. Ice cream, ice milk without nuts. Plain hard candy, honey, jelly, molasses, syrup, sugar, chocolate syrup, gumdrops, marshmallows. Limit overall sugar intake.  Fats and Oil Margarine, butter, cream, mayonnaise, salad oils, plain salad dressings made from allowed foods. Choose healthy fats such as olive oil, canola oil, and omega-3 fatty acids (such as found in salmon or tuna) when possible.  Other Bouillon, broth, or cream soups made from allowed foods. Any  strained soup. Casseroles or mixed dishes made with allowed foods. The items listed above may not be a complete list of recommended foods or beverages. Contact your dietitian for more options.  WHAT FOODS ARE NOT RECOMMENDED? Grains All whole wheat and whole grain breads and crackers. Multigrains, rye, bran  seeds, nuts, or coconut. Cereals containing whole grains, multigrains, bran, coconut, nuts, raisins. Cooked or dry oatmeal, steel-cut oats. Coarse wheat cereals, granola. Cereals advertised as high fiber. Potato skins. Whole grain pasta, wild or brown rice. Popcorn. Coconut flour. Bran, buckwheat, corn bread, multigrains, rye, wheat germ.  Vegetables Fresh, cooked or canned vegetables, such as artichokes, asparagus, beet greens, broccoli, Brussels sprouts, cabbage, celery, cauliflower, corn, eggplant, kale, legumes or beans, okra, peas, and tomatoes. Avoid large servings of any vegetables, especially raw vegetables.  Fruits Fresh fruits, such as apples with or without skin, berries, cherries, figs, grapes, grapefruit, guavas, kiwis, mangoes, oranges, papayas, pears, persimmons, pineapple, and pomegranate. Prune juice and juices with pulp, stewed or dried prunes. Dried fruits, dates, raisins. Fruit seeds or skins. Avoid large servings of all fresh fruits. Meats and Other Protein Sources Tough, fibrous meats with gristle. Chunky nut butter. Cheese made with seeds, nuts, or other foods not recommended. Nuts, seeds, legumes (beans, including baked beans), dried peas, beans, lentils.  Dairy Yogurt or cheese that contains nuts, seeds, or added fruit.  Beverages Fruit juices with high pulp, prune juice. Caffeinated coffee and teas.  Condiments Coconut, maple syrup, pickles, olives. Sweets and Desserts Desserts, cookies, or candies that contain nuts or coconut, chunky peanut butter, dried fruits. Jams, preserves with seeds, marmalade. Large amounts of sugar and sweets. Any other dessert made with fruits from the not recommended list.  Other Soups made from vegetables that are not recommended or that contain other foods not recommended.  The items listed above may not be a complete list of foods and beverages to avoid. Contact your dietitian for more information. Document Released: 03/11/2002 Document  Revised: 09/24/2013 Document Reviewed: 08/12/2013 [facility] Patient Information 2015 [place], [place]. This information is not intended to replace advice given to you by your health care provider. Make sure you discuss any questions you have with your health care provider.

## 2014-04-09 NOTE — Progress Notes (Signed)
Subjective: She is feeling better and wants to know when she can go home.  She has done well with full liquids at supper and breakfast.    Objective: Vital signs in last 24 hours: Temp:  [98 F (36.7 C)-99.6 F (37.6 C)] 98 F (36.7 C) (07/08 0536) Pulse Rate:  [74-94] 74 (07/08 0536) Resp:  [16] 16 (07/08 0536) BP: (137-153)/(57-86) 140/57 mmHg (07/08 0536) SpO2:  [93 %-94 %] 94 % (07/08 0536) Last BM Date: 04/08/14 720 PO BM yesterday and today Afebrile, VSS Labs OK Diet: full liquids Intake/Output from previous day: 07/07 0701 - 07/08 0700 In: 2271.3 [P.O.:720; I.V.:1551.3] Out: 1850 [Urine:1850] Intake/Output this shift: Total I/O In: -  Out: 250 [Urine:250]  General appearance: alert, cooperative and no distress Resp: clear to auscultation bilaterally GI: soft, still a bit distended, sitting, + BM and +BS  reports BM's are normal  Lab Results:   Recent Labs  04/08/14 0450  WBC 5.5  HGB 11.6*  HCT 36.8  PLT 109*    BMET  Recent Labs  04/08/14 0450 04/09/14 0443  NA 144 138  K 3.3* 4.8  CL 105 105  CO2 28 27  GLUCOSE 132* 111*  BUN 14 12  CREATININE 0.90 0.91  CALCIUM 8.5 8.4   PT/INR No results found for this basename: LABPROT, INR,  in the last 72 hours   Recent Labs Lab 04/05/14 2138 04/06/14 0500  AST 27 24  ALT 23 18  ALKPHOS 100 82  BILITOT 0.9 0.6  PROT 8.2 6.6  ALBUMIN 4.6 3.6     Lipase     Component Value Date/Time   LIPASE 64* 04/05/2014 2138     Studies/Results: Dg Abd Acute W/chest  04/08/2014   CLINICAL DATA:  Abdominal pain and small bowel obstruction.  EXAM: ACUTE ABDOMEN SERIES (ABDOMEN 2 VIEW & CHEST 1 VIEW)  COMPARISON:  04/06/2014 and CT on 04/05/2014.  FINDINGS: Nasogastric tube shows stable positioning in the stomach. Lungs show no edema or consolidation. There is scarring/ atelectasis at the left base.  Abdominal films show air in nondilated small bowel air throughout much of the colon. No free air is  identified. Stable degenerative scoliosis of the spine.  IMPRESSION: Resolution of small bowel obstruction.  No free air.   Electronically Signed   By: Aletta Edouard M.D.   On: 04/08/2014 09:41    Medications: . amLODipine  10 mg Oral Daily  . antiseptic oral rinse  15 mL Mouth Rinse q12n4p  . chlorhexidine  15 mL Mouth Rinse BID  . enoxaparin (LOVENOX) injection  30 mg Subcutaneous Q24H  . irbesartan  150 mg Oral Daily  . loratadine  10 mg Oral Daily  . pantoprazole (PROTONIX) IV  40 mg Intravenous QHS  . potassium chloride  20 mEq Oral BID    Assessment/Plan SBO  EXPLORATORY LAPAROTOMY removal of smal bowel foreign body,(Foreign body, removal, small bowel  - FOREIGN MATERIAL, CONSISTENT WITH FOOD MATERIAL.) Dr. Lilyan Punt, 01/01/13,  Post op acute renal failure 2014 Mild renal insuffiencey on admit 1.8 now resolved with hydration Post op acute respiratory failure 2014 PAF/amiodarone required  Post op sepsis/pneumonia from aspiration/peritonitis  Borderline AODM  Anemia  Thrombocytopenia/Factor V Leiden with hx of DVT  Hypokalemia   Plan  Regular diet and home after lunch if OK her son is here now and can be with her tonight.  Doing well after regular lunch and want to go home.  She has had another BM also.  She  is to call PCP and follow up with potassium and BP in 1-2 weeks.  LOS: 4 days    Loralei Radcliffe 04/09/2014

## 2014-04-10 NOTE — Discharge Summary (Signed)
Physician Discharge Summary  Patient ID: [name] MRN: [id number] DOB/AGE: 1928/10/17 80 y.o.  Admit date: 04/05/2014 Discharge date: 04/09/2014  Admission Diagnoses:  1.  SBO with history of: EXPLORATORY LAPAROTOMY removal of smal bowel foreign body,(Foreign body, removal, small bowel  - FOREIGN MATERIAL, CONSISTENT WITH FOOD MATERIAL.) Dr. Lilyan Punt, 01/01/13,  2.  Post op acute renal failure 2014   Mild renal insuffiencey on admit 1.8 now   resolved with hydration 3.  Post op acute respiratory failure 2014  4.  PAF/amiodarone required  5.  Post op sepsis/pneumonia from aspiration/peritonitis  6.  Borderline AODM  7.  Anemia  8.  Thrombocytopenia/Factor V Leiden with hx of DVT  9.  Hypokalemia   Discharge Diagnoses: SAME Principal Problem:   SBO (small bowel obstruction) Active Problems:   HTN (hypertension)   Chronic kidney disease, unspecified   Diabetes mellitus   Factor V Leiden   PROCEDURES: None  Hospital Course: 5 hour hx of diffuse abdominal pain. Here last year and had ex lap for SBO secondary to bezor. Had BM today. Having N/V this PM. Pain at a 4. Crampy / diffuse.   She was seen in the ED, and admitted with bowel rest, NG decompression, and hydration.  Her symptoms improved and her diet was slowly increased.  She was eating and having bowel movements, and she was discharged home on 04/09/14.  She had a low potassium we replaced, and I have ask her to see her PCP in the next 1-2 weeks for medical follow up.    Condition on d/c:  Improved    Disposition: 01-Home or Self Care     Medication List         acetaminophen 325 MG tablet  Commonly known as:  TYLENOL  Take 2 tablets (650 mg total) by mouth every 4 (four) hours as needed.     amLODipine 10 MG tablet  Commonly known as:  NORVASC  Take 10 mg by mouth daily.     aspirin EC 81 MG tablet  Take 81 mg by mouth daily.     calcium carbonate 500 MG chewable tablet  Commonly known as:  TUMS - dosed in mg  elemental calcium  Chew 1-2 tablets (200-400 mg of elemental calcium total) by mouth 3 (three) times daily as needed.     furosemide 20 MG tablet  Commonly known as:  LASIX  Take 10 mg by mouth daily.     irbesartan 300 MG tablet  Commonly known as:  AVAPRO  Take 300 mg by mouth daily.     multivitamin with minerals Tabs tablet  Take 1 tablet by mouth daily.     rosuvastatin 5 MG tablet  Commonly known as:  CRESTOR  Take 5 mg by mouth daily.           Follow-up Information   Schedule an appointment as soon as possible for a visit with Adin Hector., MD. (As needed, call if you have an issue.)    Specialty:  General Surgery   Contact information:   [place] [address] [phone]       Follow up with Reginia Naas, MD. Schedule an appointment as soon as possible for a visit in 2 weeks. (For medical issues, check your Potassium, and to follow up on your blood pressure.)    Specialty:  Family Medicine   Contact information:   [address] [place] [address] [phone]  SignedEarnstine Regal 04/10/2014, 4:02 PM

## 2014-04-10 NOTE — Discharge Summary (Signed)
Bowel obstruction resolved nonoperatively.  Strongly recommended bowel regimen.  Continue regular exercise.

## 2014-05-08 IMAGING — CR DG CHEST 1V PORT
1 series · 1 of 1 positions shown · non-contrast
Comparison: 12/31/2012 and 11/01/2010.

CLINICAL DATA: Central line placement.

PORTABLE CHEST - 1 VIEW

[AP]
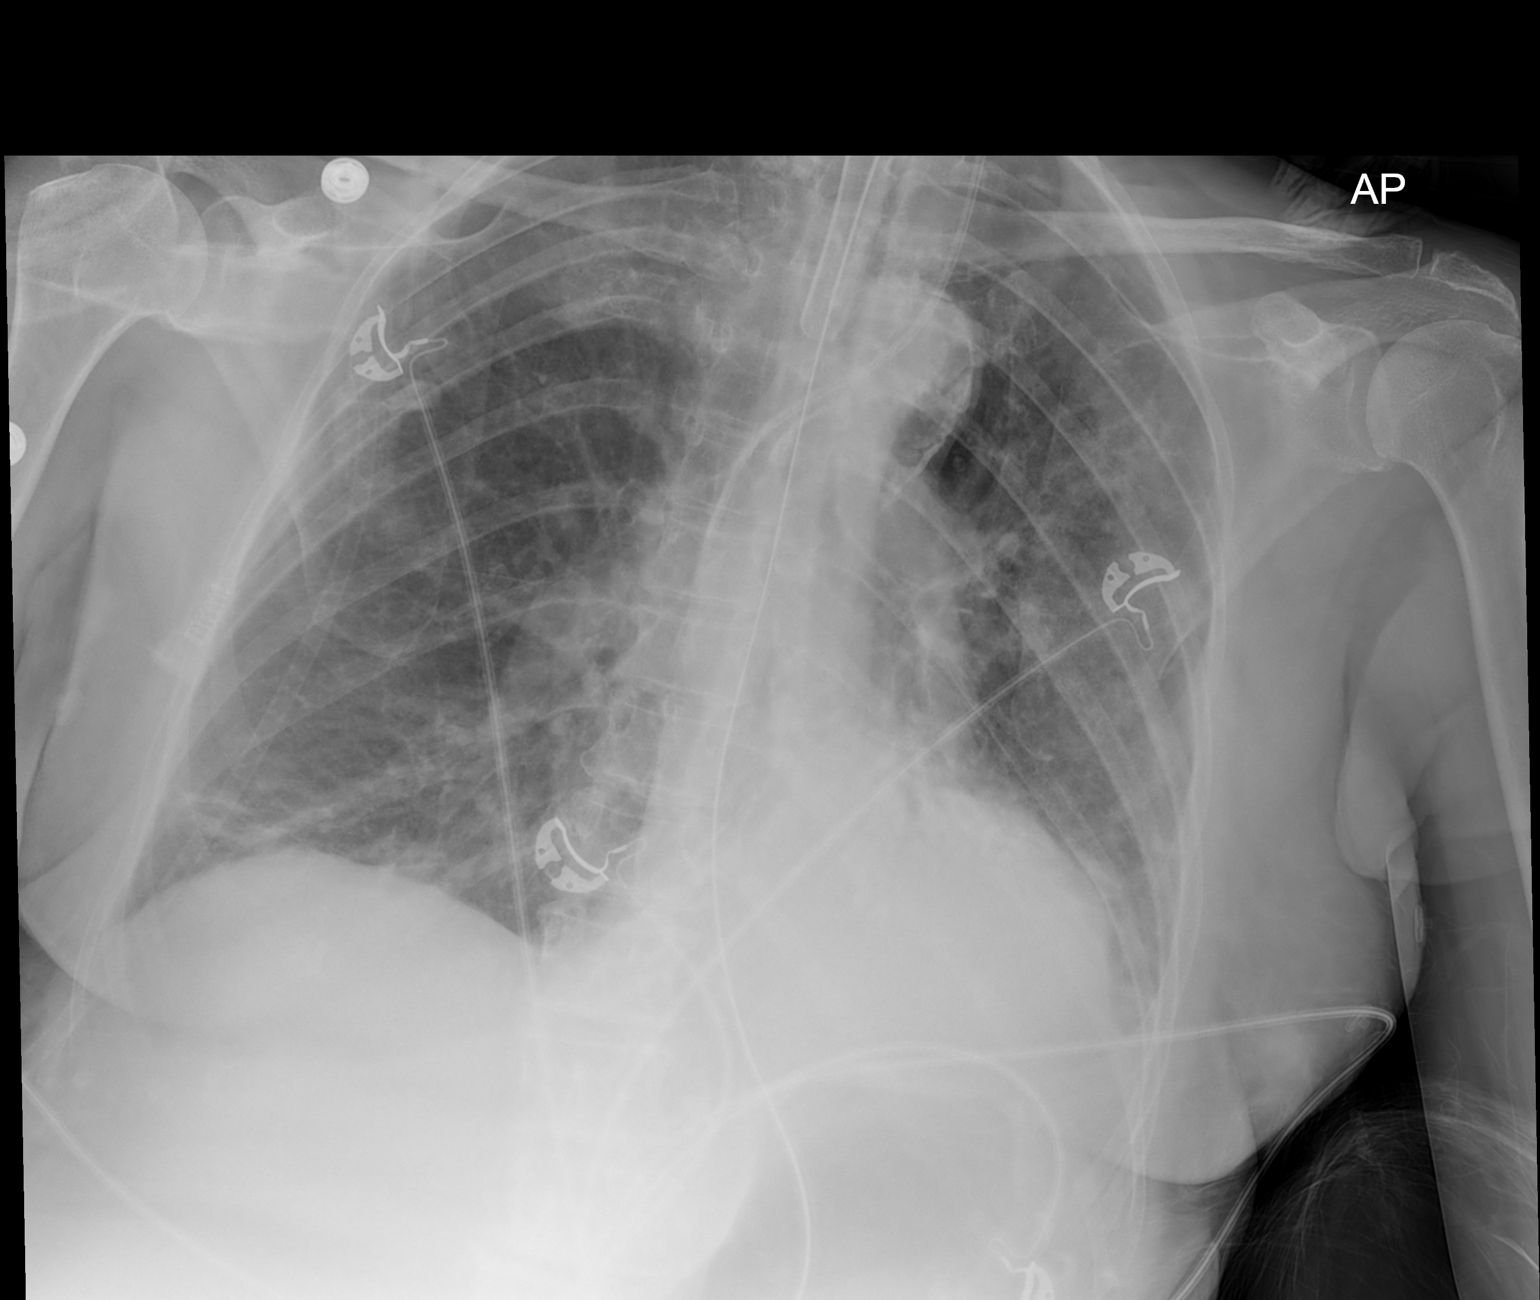

[1 of 1 positions shown; findings below may reference images not displayed]

FINDINGS: Left central line tip at the level of the proximal to mid
superior vena cava.  No gross pneumothorax.  Mild asymmetric apical
pleural thickening unchanged from remote exams.

Endotracheal tube tip 4.7 cm above the carina.

Nasogastric tube courses below the diaphragm.  The tip is not
included on this exam..

Consolidation in the left lung base may represent atelectasis as
there is mild mediastinal shift to the left.  Underlying infiltrate
or mass not entirely excluded.  Attention to this on follow-up.

Pulmonary vascular prominence.

Calcified mildly tortuous aorta.
IMPRESSION: [Left central line tip at the level of the proximal to mid superior
vena cava.

Consolidation in the left lung base may represent atelectasis as
there is mild mediastinal shift to the left.  .

Pulmonary vascular prominence.

Calcified mildly tortuous aorta.

This is a call report.

## 2014-05-09 IMAGING — US US RENAL PORT
1 series · 14 of 25 positions shown · non-contrast
Comparison: CT 12/31/2012

CLINICAL DATA: Acute renal insufficiency

RENAL/URINARY TRACT ULTRASOUND COMPLETE

[Series 1: us renal port · 0.26mm/px · 14 of 42 slices shown]
[im 1/42]
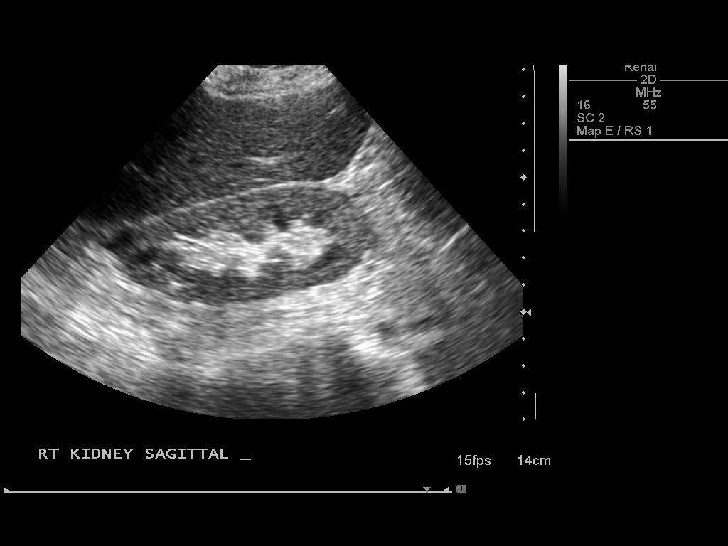
[im 4/42]
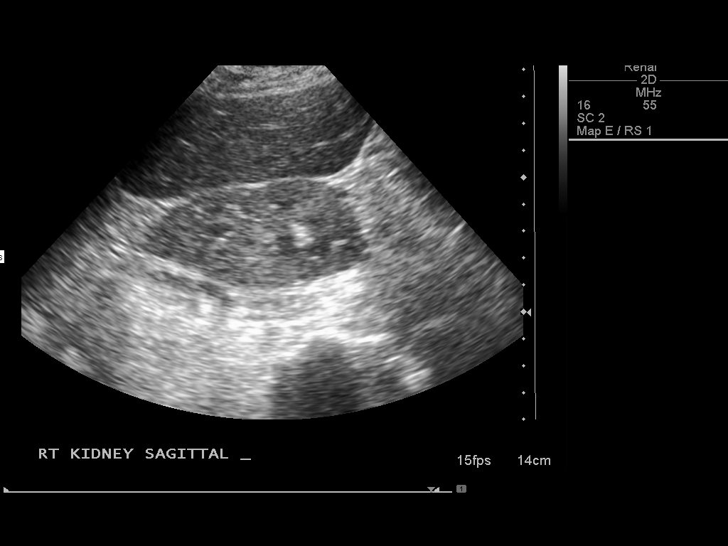
[im 7/42]
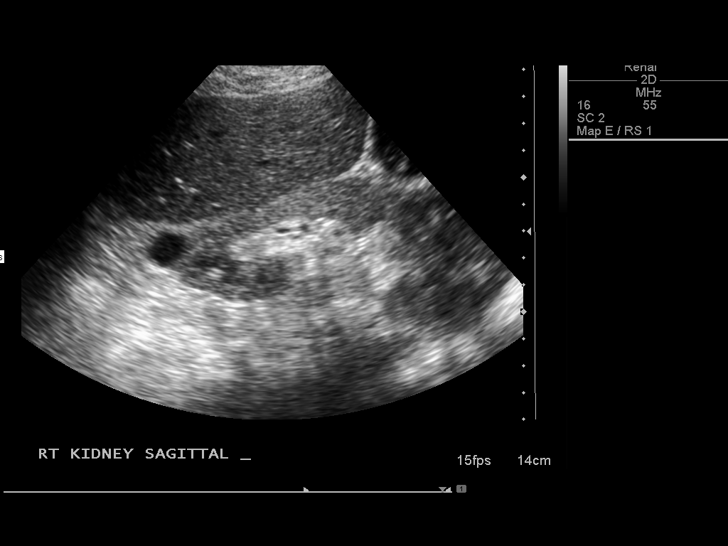
[im 11/42]
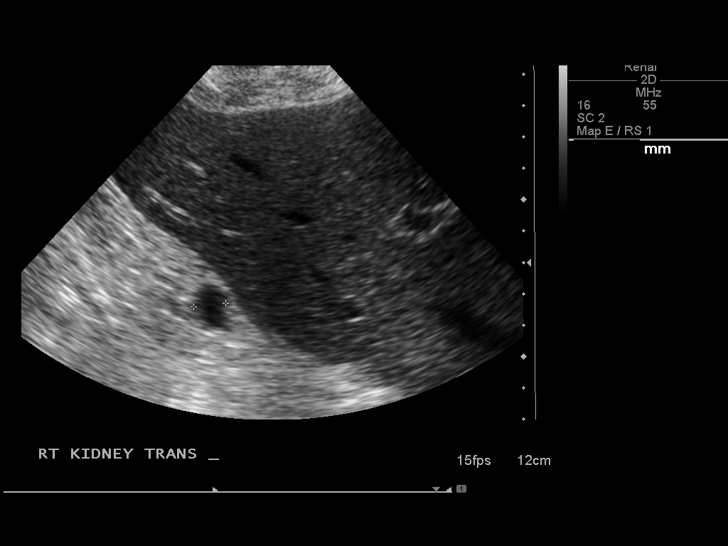
[im 14/42]
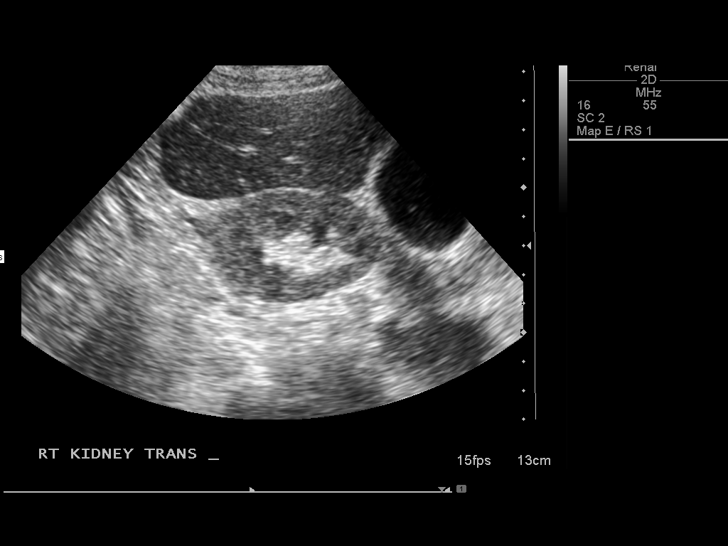
[im 16/42]
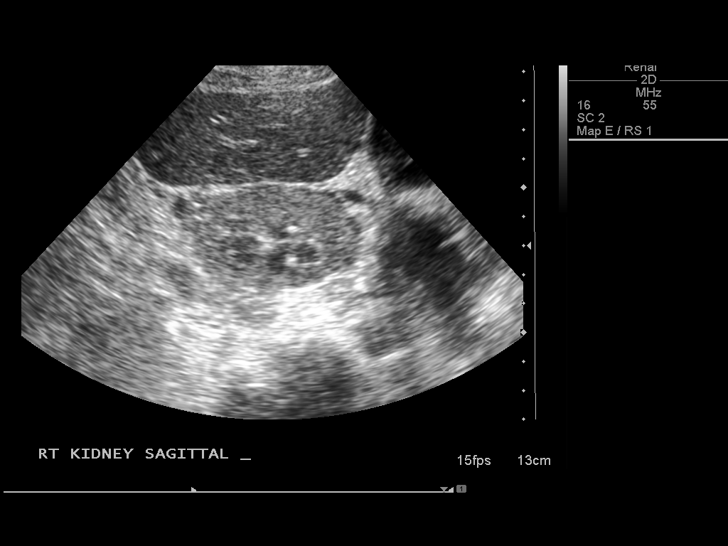
[im 19/42]
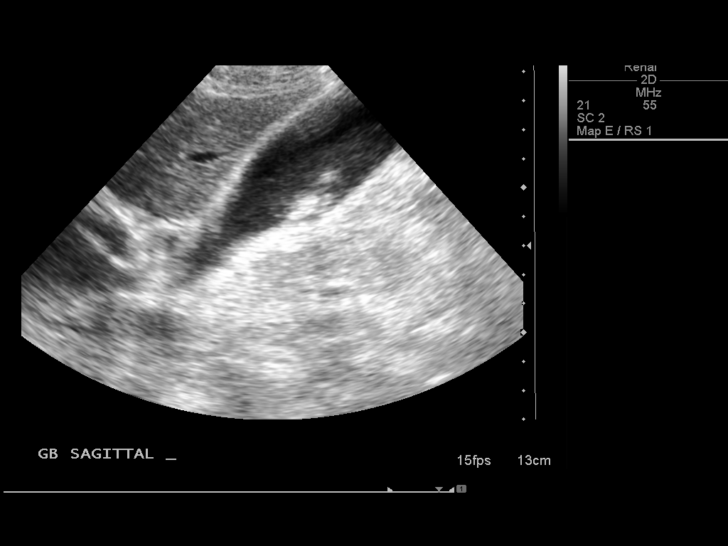
[im 23/42]
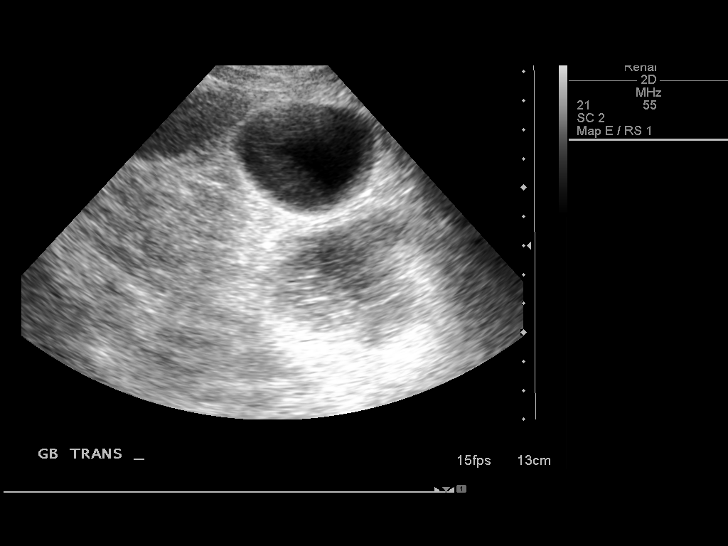
[im 26/42]
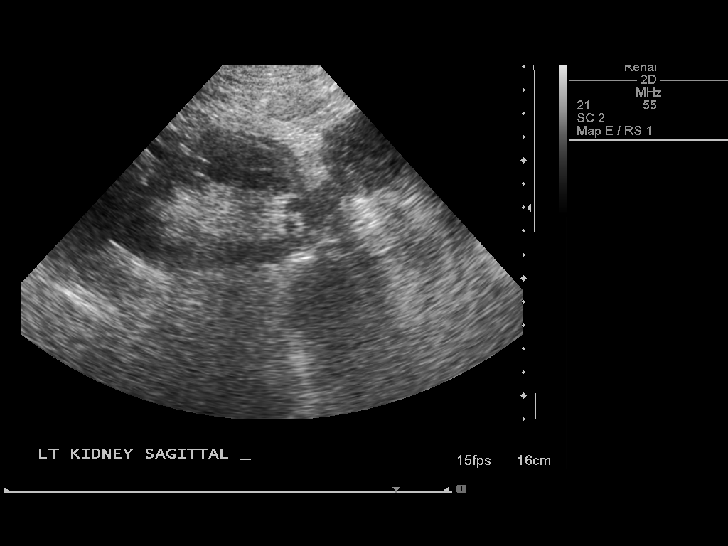
[im 28/42]
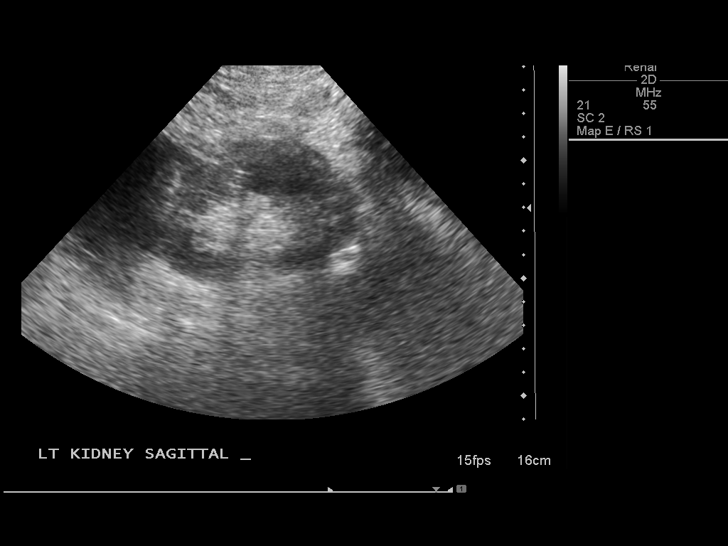
[im 31/42]
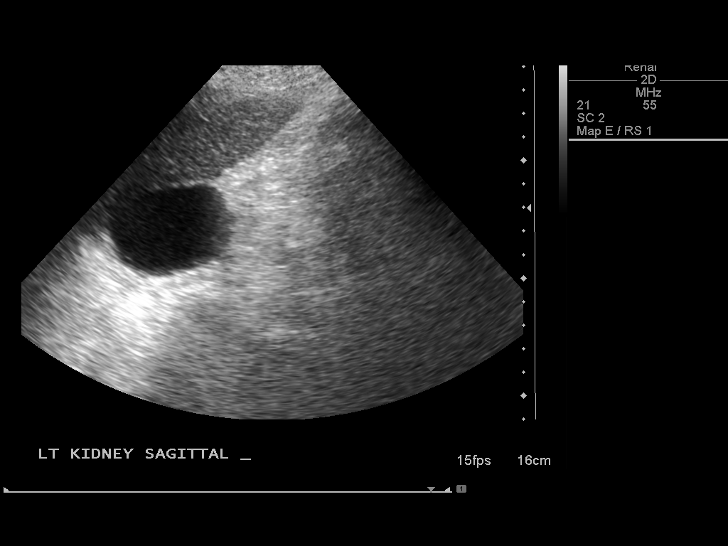
[im 35/42]
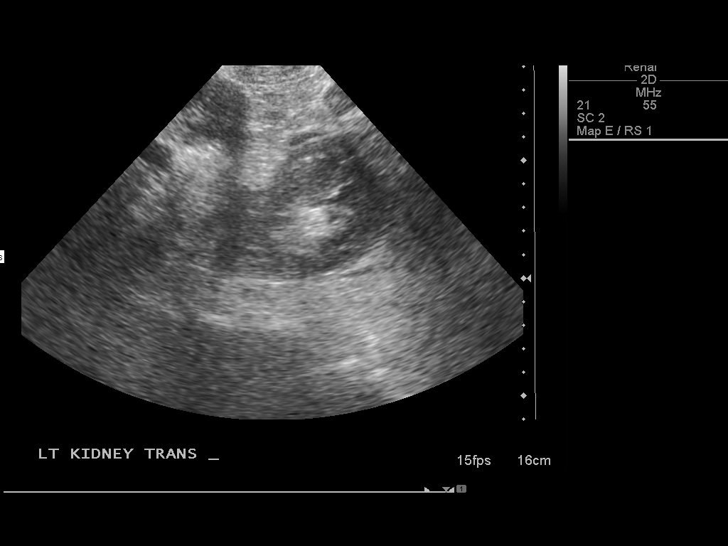
[im 38/42]
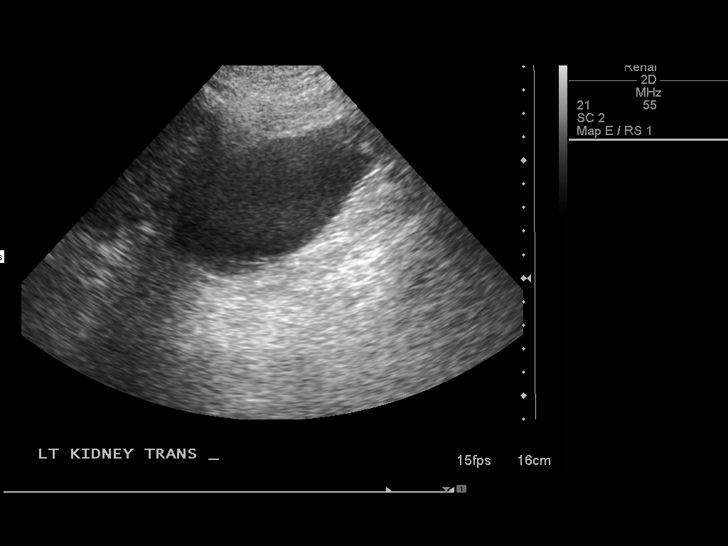
[im 42/42]
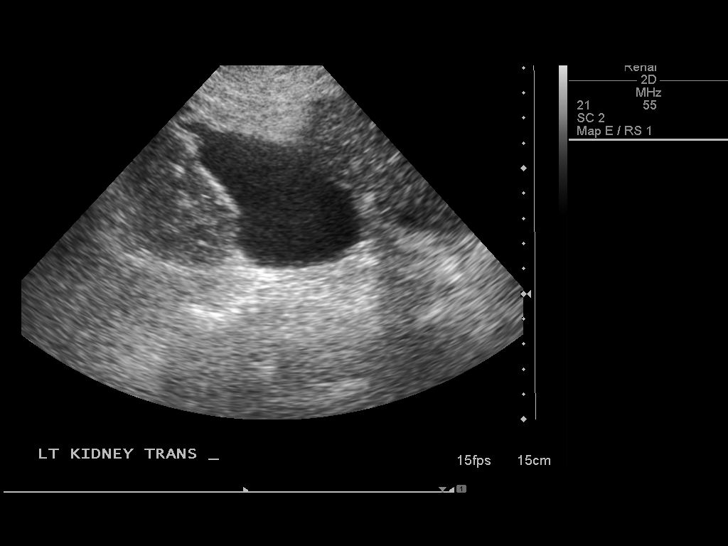

[14 of 25 positions shown; findings below may reference images not displayed]

FINDINGS: Right Kidney:  15 mm cyst right upper pole.  Negative for
obstruction.  Renal cortex is normal.  Right kidney measures 9.8 cm
in length.

Left Kidney:  9.8 cm in length.  Negative for obstruction.  Large
upper pole cyst measures 5 x 6 cm.  Normal appearing renal cortex.

Gallbladder sludge and possible gallstones are noted.

Bladder:  Empty bladder.
IMPRESSION: Bilateral renal cysts.  Negative for hydronephrosis

Gallbladder sludge and possible gallstones.

## 2014-05-10 IMAGING — CR DG CHEST 1V PORT
1 series · 1 of 1 positions shown · non-contrast
Comparison: 01/02/2013

CLINICAL DATA: Follow-up atelectasis and pneumonia.

PORTABLE CHEST - 1 VIEW

[AP]
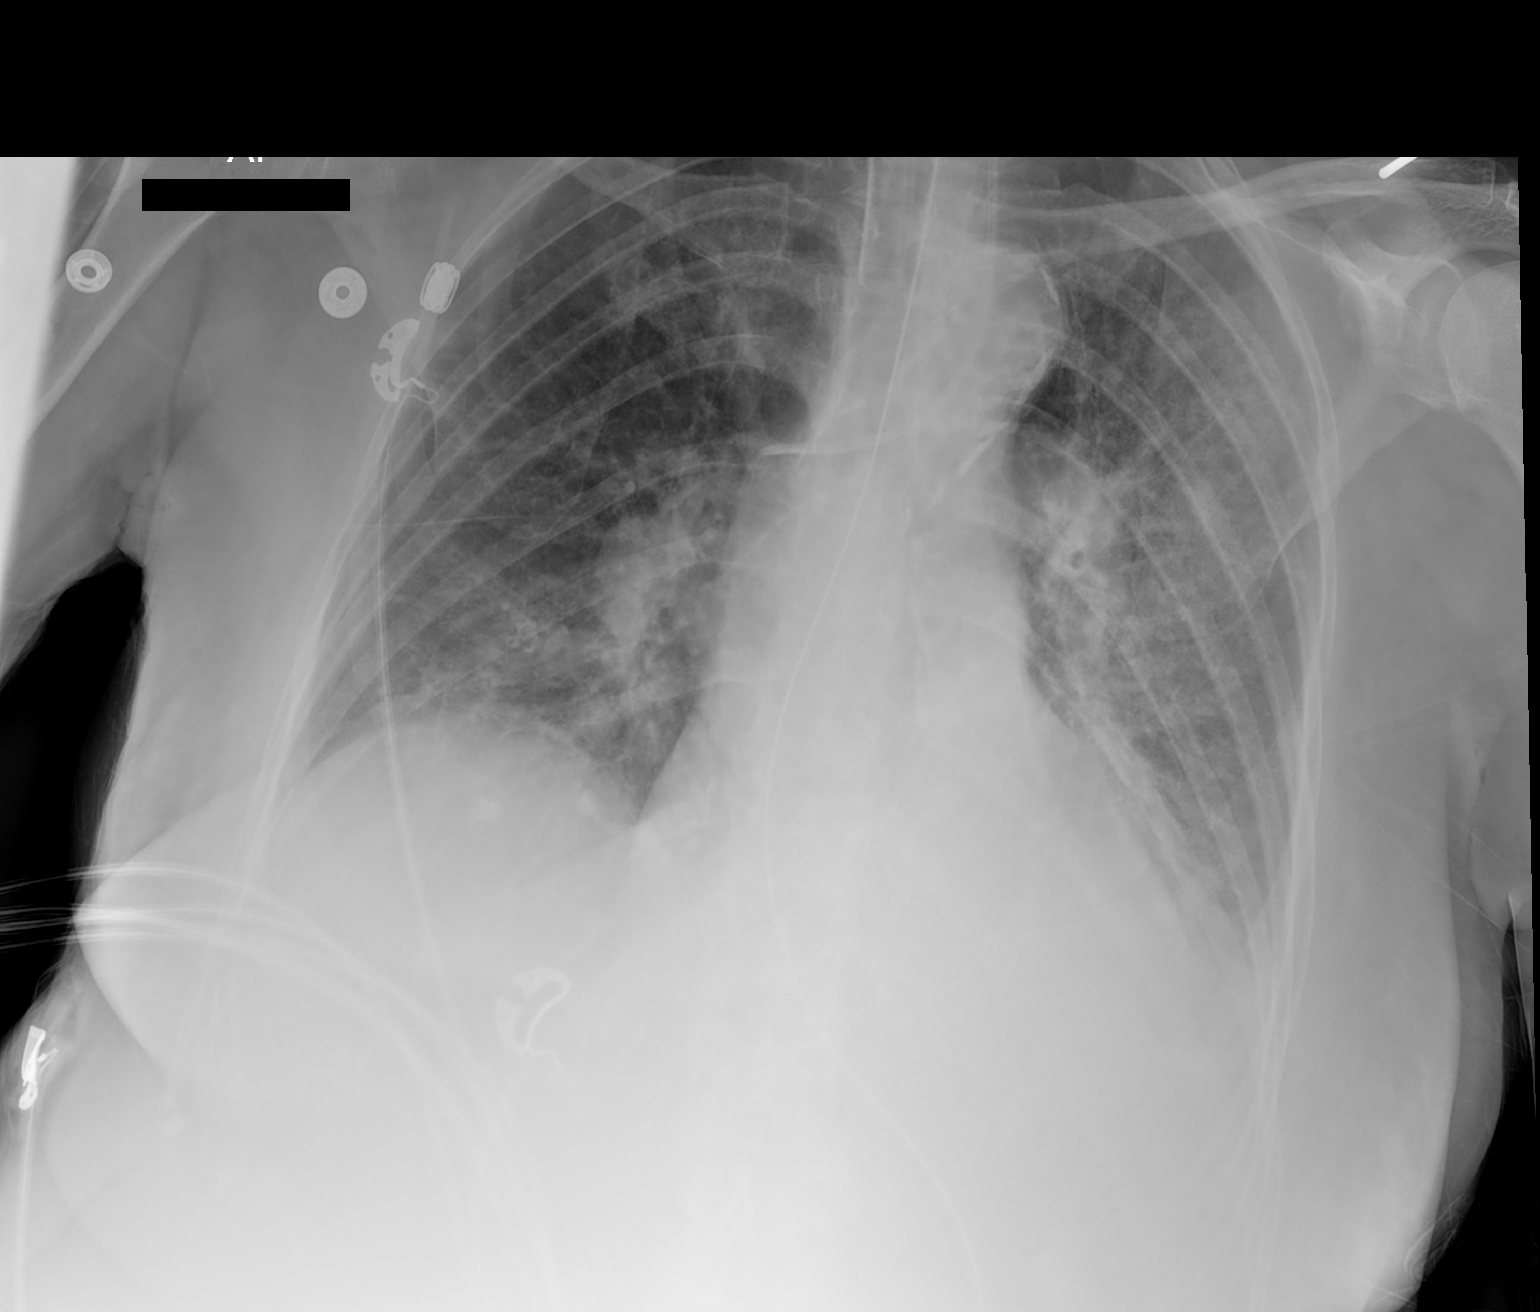

[1 of 1 positions shown; findings below may reference images not displayed]

FINDINGS: Endotracheal tube tip measures 5 cm above the carina.
Enteric tube tip is at the field of view but below the left
hemidiaphragm.  Left central venous catheter tip is directed
horizontally in the mediastinum consistent location of the junction
of the brachial cephalic and superior vena cava.  Shallow
inspiration.  Borderline heart size and pulmonary vascularity.
Mild perihilar infiltration.  Bilateral basilar atelectasis or
infiltration.  Obscuration of the left hemidiaphragm suggest small
effusion.  No pneumothorax.  Calcification of the aorta.
IMPRESSION: Increasing perihilar and basilar infiltration or atelectasis.
Small left pleural effusion.  Changes may be due to increase
congestion or pneumonia.

## 2014-05-11 IMAGING — CR DG CHEST 1V PORT
1 series · 1 of 1 positions shown · non-contrast
Comparison: Portable chest x-ray of 01/03/2013

CLINICAL DATA: Atelectasis, follow-up

PORTABLE CHEST - 1 VIEW

[AP]
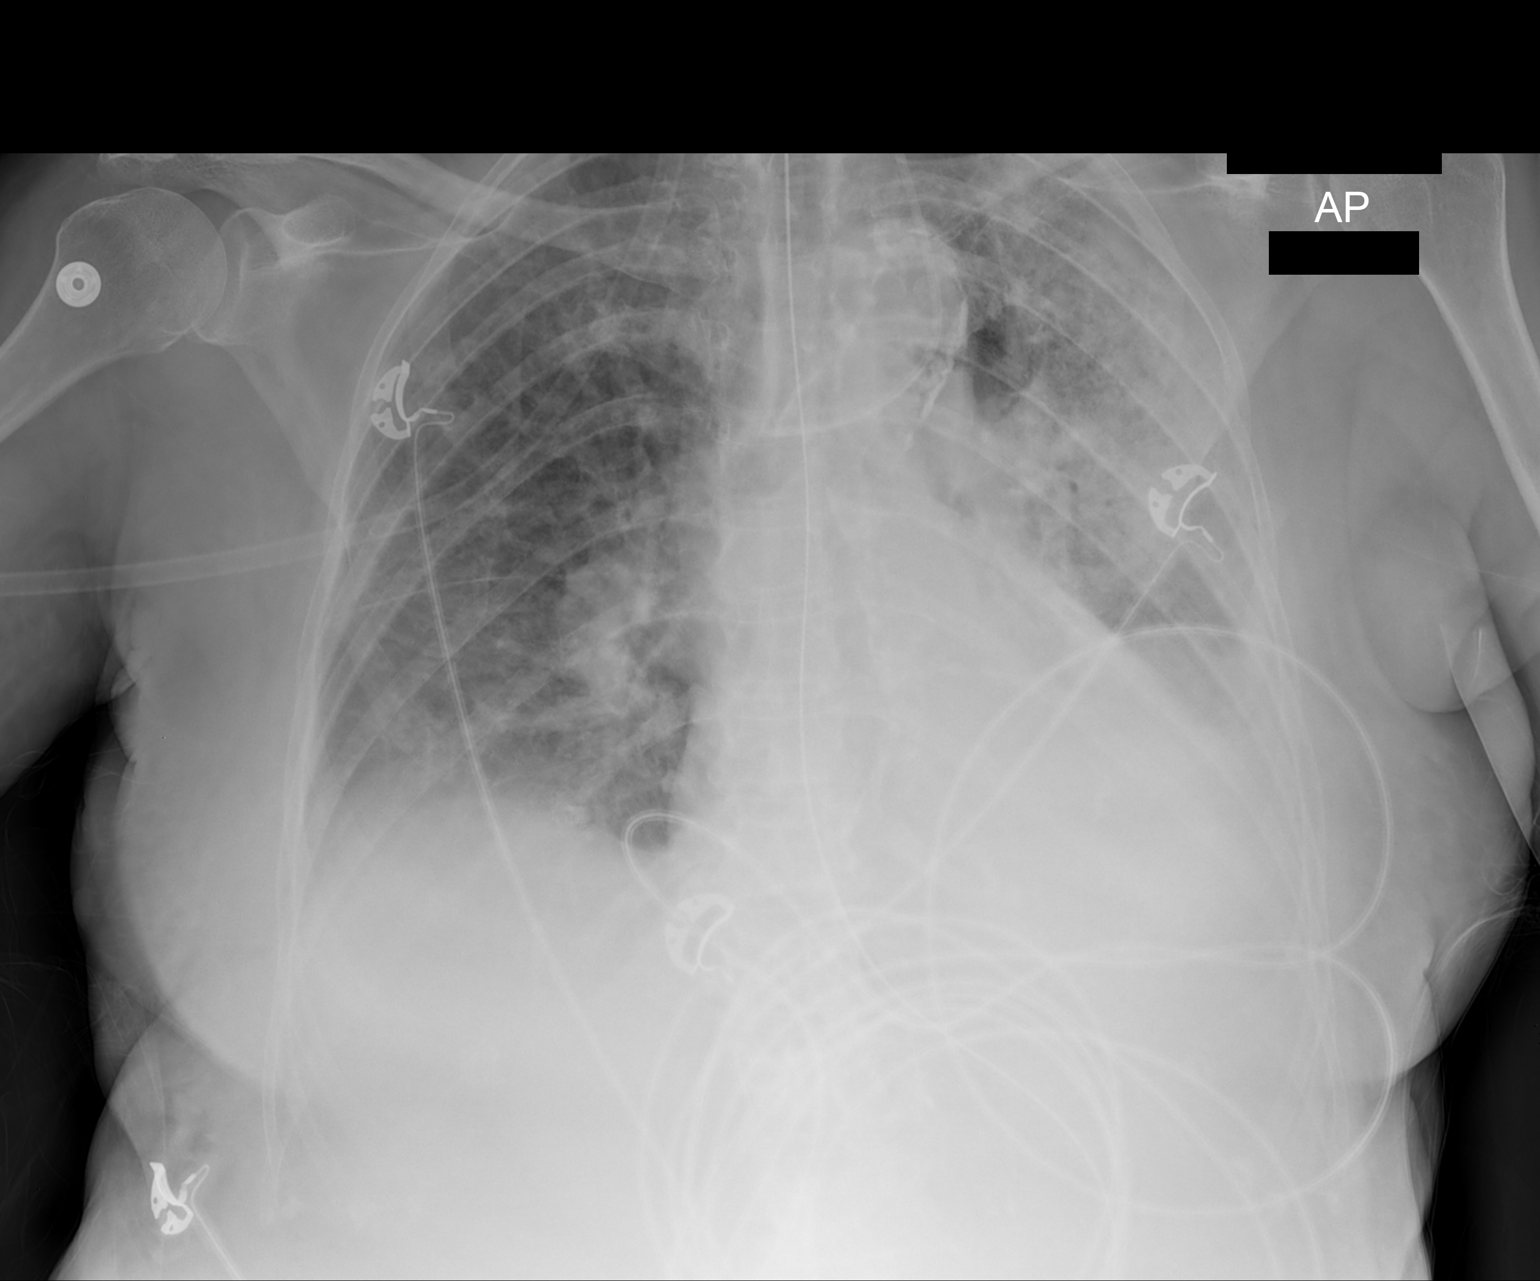

[1 of 1 positions shown; findings below may reference images not displayed]

FINDINGS: The lungs are not quite as well aerated.  Diffuse
airspace disease remains.  There may be small effusions present.
Cardiomegaly is stable.  Endotracheal tube is no longer seen.  The
left central venous line is unchanged in position and an NG tube
extends below the hemidiaphragm.
IMPRESSION: Little change in poor aeration with diffuse airspace disease.
Endotracheal tube is no longer seen.

## 2014-05-15 IMAGING — CR DG CHEST 1V PORT
1 series · 1 of 1 positions shown · non-contrast
Comparison: 01/05/2013

CLINICAL DATA: Evaluate for edema.

PORTABLE CHEST - 1 VIEW

[AP]
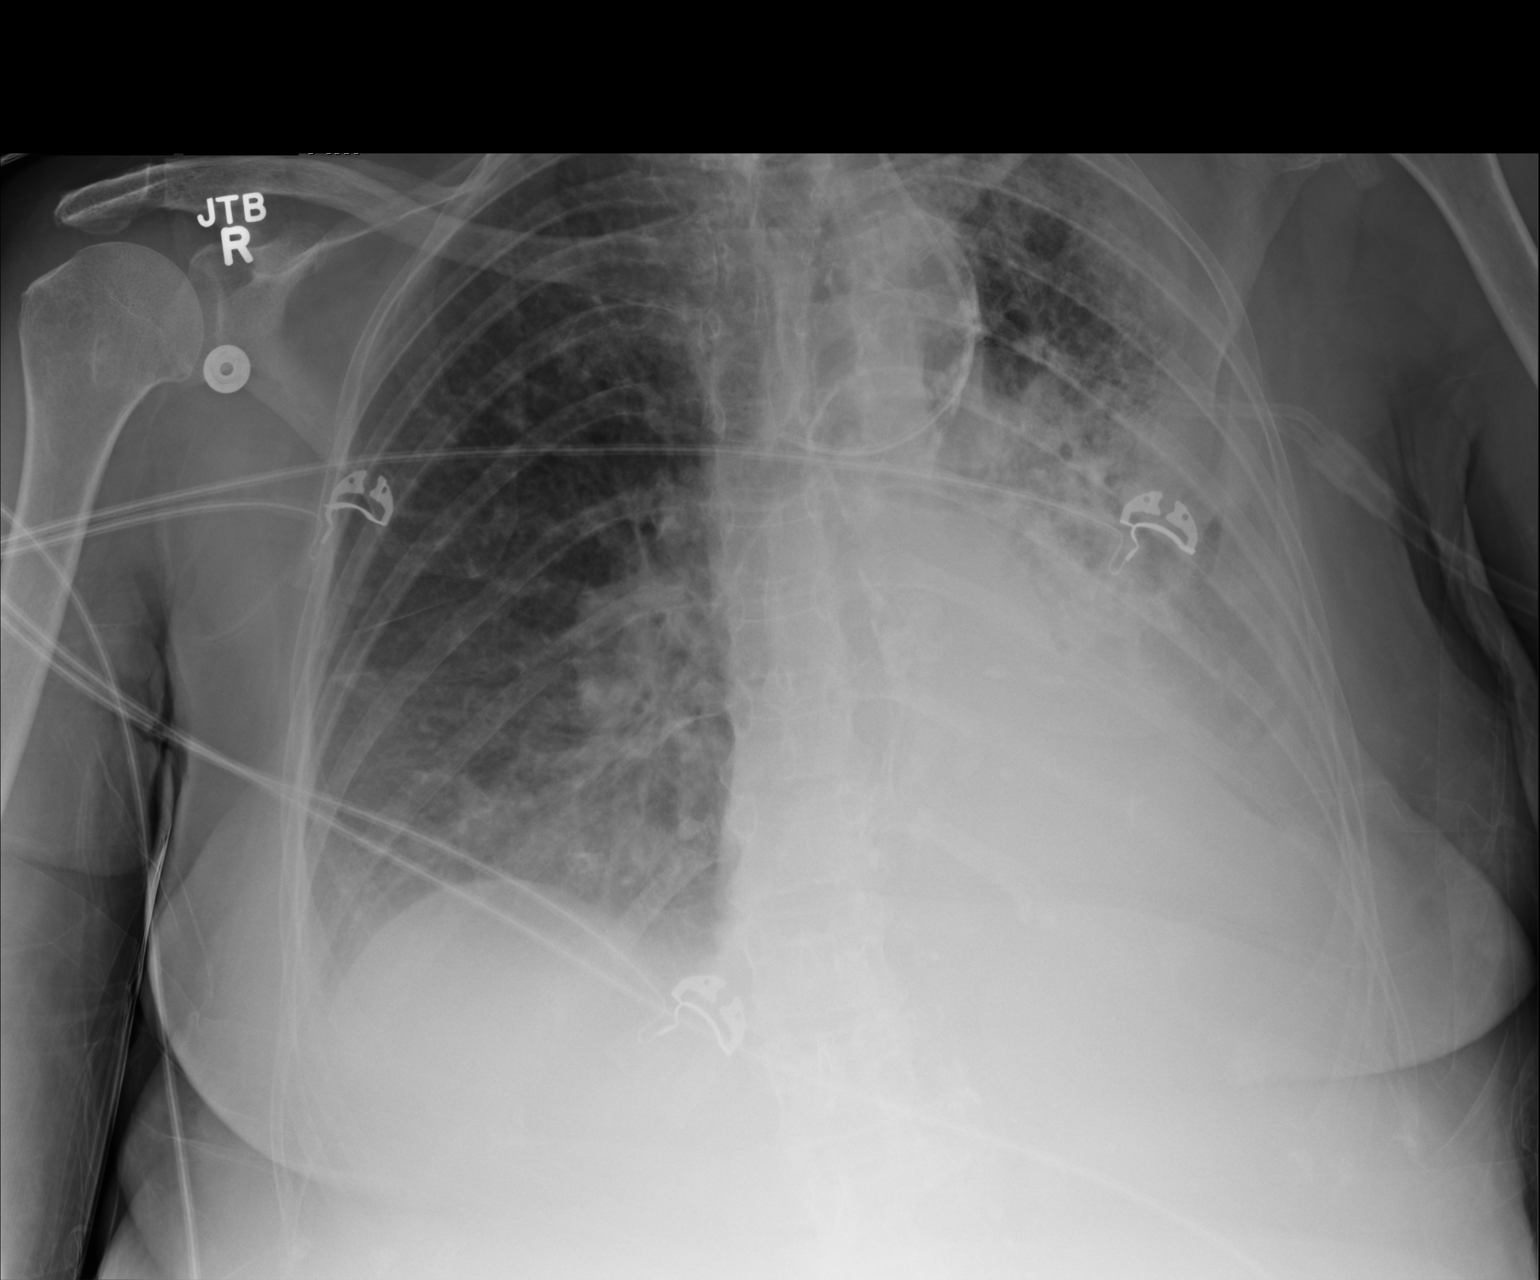

[1 of 1 positions shown; findings below may reference images not displayed]

FINDINGS: There are persistent opacities throughout the left
hemithorax that are concerning for pleural and parenchymal disease.
There may be dependent edema at the right lung bases which is
unchanged.  The heart is obscured by the left lung densities.
Again noted is a heavily calcified thoracic aorta.  The left
jugular central venous catheter has an unusual course but this
could be related to tortuosity of the left innominate vein.
Suspect that the tip is near the junction of the left innominate
vein and SVC.  Catheter position is unchanged from the previous
examination.  Nasogastric tube has been removed.
IMPRESSION: Diffuse pleural and parenchymal densities in the left hemithorax
have not significantly changed.

Persistent haziness at the right lung base could represent edema.

Left central venous catheter as described.

## 2015-05-21 ENCOUNTER — Encounter: Payer: Self-pay | Admitting: Gastroenterology

## 2015-07-10 ENCOUNTER — Other Ambulatory Visit (HOSPITAL_COMMUNITY): Payer: Self-pay | Admitting: Family Medicine

## 2015-07-10 DIAGNOSIS — R011 Cardiac murmur, unspecified: Secondary | ICD-10-CM

## 2015-07-16 ENCOUNTER — Other Ambulatory Visit: Payer: Self-pay

## 2015-07-16 ENCOUNTER — Ambulatory Visit (HOSPITAL_COMMUNITY): Payer: Medicare Other | Attending: Cardiology

## 2015-07-16 DIAGNOSIS — Z87891 Personal history of nicotine dependence: Secondary | ICD-10-CM | POA: Diagnosis not present

## 2015-07-16 DIAGNOSIS — R011 Cardiac murmur, unspecified: Secondary | ICD-10-CM | POA: Diagnosis not present

## 2015-07-16 DIAGNOSIS — I10 Essential (primary) hypertension: Secondary | ICD-10-CM | POA: Insufficient documentation

## 2015-07-16 DIAGNOSIS — I071 Rheumatic tricuspid insufficiency: Secondary | ICD-10-CM | POA: Insufficient documentation

## 2015-07-16 DIAGNOSIS — I340 Nonrheumatic mitral (valve) insufficiency: Secondary | ICD-10-CM | POA: Diagnosis not present

## 2015-07-16 DIAGNOSIS — I351 Nonrheumatic aortic (valve) insufficiency: Secondary | ICD-10-CM | POA: Diagnosis not present

## 2015-07-16 DIAGNOSIS — I059 Rheumatic mitral valve disease, unspecified: Secondary | ICD-10-CM | POA: Diagnosis not present

## 2015-12-10 ENCOUNTER — Other Ambulatory Visit: Payer: Self-pay | Admitting: Nephrology

## 2015-12-10 DIAGNOSIS — N183 Chronic kidney disease, stage 3 unspecified: Secondary | ICD-10-CM

## 2015-12-10 DIAGNOSIS — I159 Secondary hypertension, unspecified: Secondary | ICD-10-CM

## 2015-12-16 ENCOUNTER — Ambulatory Visit
Admission: RE | Admit: 2015-12-16 | Discharge: 2015-12-16 | Disposition: A | Discharge status: Home / Self Care | Payer: Medicare Other | Source: Ambulatory Visit | Attending: Nephrology | Admitting: Nephrology

## 2015-12-16 DIAGNOSIS — I159 Secondary hypertension, unspecified: Secondary | ICD-10-CM

## 2015-12-16 DIAGNOSIS — N183 Chronic kidney disease, stage 3 unspecified: Secondary | ICD-10-CM

## 2016-03-04 ENCOUNTER — Encounter: Payer: Medicare Other | Attending: Family Medicine | Admitting: Dietician

## 2016-03-04 ENCOUNTER — Encounter: Payer: Self-pay | Admitting: Dietician

## 2016-03-04 VITALS — Ht 63.0 in | Wt 139.0 lb

## 2016-03-04 DIAGNOSIS — I129 Hypertensive chronic kidney disease with stage 1 through stage 4 chronic kidney disease, or unspecified chronic kidney disease: Secondary | ICD-10-CM | POA: Insufficient documentation

## 2016-03-04 DIAGNOSIS — N183 Chronic kidney disease, stage 3 unspecified: Secondary | ICD-10-CM

## 2016-03-04 NOTE — Patient Instructions (Signed)
Continue to read the labels for sodium.  Continue to choose low sodium meats (fresh meat and those labeled low sodium)  Continue to choose fresh vegetables, frozen vegetables without salt, and canned vegetables without added salt.  Avoid added salt. Follow a low Phosphorous diet:  Choose white bread, white rice, and white pasta.  (Avoid whole grains).  Limit milk, ice cream, and yogurt to only 6 ounces per day.  Avoid dried beans  Avoid Chocolate and caramels  Avoid dried fruit  Avoid nuts and peanut butter.  No dark soda  Avoid anything with baking powder  Breakfast ideas:  Egg with white or white English muffin with tomato and fruit  6 ounces regular yogurt and fruit and white toast with jelly  1 cup cereal (honey nut cheerios or cornflakes), 1/2 cup milk, fruit, white toast with jelly  Low sodium bacon, egg, white toast with jelly  Lunch ideas:  Grilled swiss and tomato sandwich on white bread and fruit  Home made or low sodium soup and unsalted top crackers and fruit  Low sodium [place] sandwich on white with lettuce and tomato and mayo  Homemade chicken salad on white bread,  potato chips and fruit  Dinner ideas:  2-3 ounces of cooked unsalted meat, poultry, or fish  White rice or noodles or potato or sweet potato  Low sodium vegetables   Spaghetti and homemade spaghetti sauce, 2 ounces ground meat, vegetable, salad, white bread with butter and garlic powder  Stir fry fresh chicken and vegetables and serve over rice.  Season with Mrs. Dash.   Pita bread with low sodium tomato sauce and herbs and 1 ounce of cheese (bake like a pizza).  When eating out:  Go to restaurants that can prepare your food without salt   [employer] (grilled fish without salt, salad, baked potato)   Other restaurant that can prepare your food without salt.  Look at the restaurant information on line.  Google the restaurant name and nutrition and this will show you the sodium content of the food on  their menu.

## 2016-03-04 NOTE — Progress Notes (Signed)
Medical Nutrition Therapy:  Appt start time: 1500 end time:  1600.   Assessment:  Primary concerns today: She is here alone and would like to learn how to eat on a low sodium, low phosphorous diet.  She has several handouts from the doctor and some from the internet regarding these.  She states that she does not retain fluid.  Labs unknown.  today's weight was 139 lbs and patient states that this is a stable weight for her. Referral is for worsening stage 3 CKD requiring a low sodium and low phosphorous diet.  Other hx includes HTN.  Patient lives alone.  She does her own shopping and cooking.  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  Preferred Learning Style:   Auditory  Visual  Hands on  No preference indicated   Learning Readiness:   Ready  Change in progress   MEDICATIONS: see list   DIETARY INTAKE:  Usual eating pattern includes 3 meals and 1 snacks per day.  Everyday foods include [employer], [employer], or J&S 3-4 times per week.  Avoided foods include avoids added salt.    24-hr recall:  B ( AM): egg whites, 1/2 English muffin, strawberries or peaches or banana or melon, 2 cups coffee with non-dairy creamer and splenda  Snk ( AM): none  L ( PM): [place] breast (low sodium), white bread, mayo (avocado and olive oil) and no salt added potato chips Snk ( PM): low sodium popcorn D ( PM): ground meat patty, squash and zucchini, applesauce, non-dairy ice cream or sherbet Snk ( PM): none Beverages: water (at least 48 ounces daily), coffee  Usual physical activity: goes to the gym 3 times per week (2 miles on the treadmill and then weight machines for 15 minutes)  Estimated energy needs: 1600 calories 50 g protein  Progress Towards Goal(s):  In progress.   Nutritional Diagnosis:  NB-1.1 Food and nutrition-related knowledge deficit As related to a low sodium and low phosphorous diet.  As evidenced by patient report and fear.    Intervention:   Nutrition education regarding a low sodium, low phosphorous diet for CKD.  Focussed on meal planning and simplification of recommendations.  Discussed eating out and label reading.  Continue to read the labels for sodium.  Continue to choose low sodium meats (fresh meat and those labeled low sodium)  Continue to choose fresh vegetables, frozen vegetables without salt, and canned vegetables without added salt.  Avoid added salt. Follow a low Phosphorous diet:  Choose white bread, white rice, and white pasta.  (Avoid whole grains).  Limit milk, ice cream, and yogurt to only 6 ounces per day.  Avoid dried beans  Avoid Chocolate and caramels  Avoid dried fruit  Avoid nuts and peanut butter.  No dark soda  Avoid anything with baking powder  Breakfast ideas:  Egg with white or white English muffin with tomato and fruit  6 ounces regular yogurt and fruit and white toast with jelly  1 cup cereal (honey nut cheerios or cornflakes), 1/2 cup milk, fruit, white toast with jelly  Low sodium bacon, egg, white toast with jelly  Lunch ideas:  Grilled swiss and tomato sandwich on white bread and fruit  Home made or low sodium soup and unsalted top crackers and fruit  Low sodium [place] sandwich on white with lettuce and tomato and mayo  Homemade chicken salad on white bread,  potato chips and fruit  Dinner ideas:  2-3 ounces of  cooked unsalted meat, poultry, or fish  White rice or noodles or potato or sweet potato  Low sodium vegetables   Spaghetti and homemade spaghetti sauce, 2 ounces ground meat, vegetable, salad, white bread with butter and garlic powder  Stir fry fresh chicken and vegetables and serve over rice.  Season with Mrs. Dash.   Pita bread with low sodium tomato sauce and herbs and 1 ounce of cheese (bake like a pizza).  When eating out:  Go to restaurants that can prepare your food without salt   [employer] (grilled fish without salt, salad, baked potato)   Other restaurant that  can prepare your food without salt.  Look at the restaurant information on line.  Google the restaurant name and nutrition and this will show you the sodium content of the food on their menu.    Teaching Method Utilized:  Visual Auditory Hands on  Handouts given during visit include:  Low sodium Nutrition therapy from AND  Label reading  Barriers to learning/adherence to lifestyle change: none  Demonstrated degree of understanding via:  Teach Back   Monitoring/Evaluation:  Dietary intake, exercise, label reading, and body weight prn.

## 2016-10-11 DIAGNOSIS — E875 Hyperkalemia: Secondary | ICD-10-CM | POA: Diagnosis not present

## 2016-10-12 DIAGNOSIS — S39012A Strain of muscle, fascia and tendon of lower back, initial encounter: Secondary | ICD-10-CM | POA: Diagnosis not present

## 2016-10-12 DIAGNOSIS — S60519A Abrasion of unspecified hand, initial encounter: Secondary | ICD-10-CM | POA: Diagnosis not present

## 2016-10-12 DIAGNOSIS — S80211A Abrasion, right knee, initial encounter: Secondary | ICD-10-CM | POA: Diagnosis not present

## 2016-10-12 DIAGNOSIS — S80212A Abrasion, left knee, initial encounter: Secondary | ICD-10-CM | POA: Diagnosis not present

## 2017-01-09 DIAGNOSIS — I10 Essential (primary) hypertension: Secondary | ICD-10-CM | POA: Diagnosis not present

## 2017-01-09 DIAGNOSIS — E7800 Pure hypercholesterolemia, unspecified: Secondary | ICD-10-CM | POA: Diagnosis not present

## 2017-01-09 DIAGNOSIS — N183 Chronic kidney disease, stage 3 (moderate): Secondary | ICD-10-CM | POA: Diagnosis not present

## 2017-02-07 DIAGNOSIS — H9193 Unspecified hearing loss, bilateral: Secondary | ICD-10-CM | POA: Diagnosis not present

## 2017-02-07 DIAGNOSIS — E7800 Pure hypercholesterolemia, unspecified: Secondary | ICD-10-CM | POA: Diagnosis not present

## 2017-02-07 DIAGNOSIS — E118 Type 2 diabetes mellitus with unspecified complications: Secondary | ICD-10-CM | POA: Diagnosis not present

## 2017-02-07 DIAGNOSIS — N183 Chronic kidney disease, stage 3 (moderate): Secondary | ICD-10-CM | POA: Diagnosis not present

## 2017-02-07 DIAGNOSIS — M810 Age-related osteoporosis without current pathological fracture: Secondary | ICD-10-CM | POA: Diagnosis not present

## 2017-02-07 DIAGNOSIS — D6851 Activated protein C resistance: Secondary | ICD-10-CM | POA: Diagnosis not present

## 2017-02-07 DIAGNOSIS — I129 Hypertensive chronic kidney disease with stage 1 through stage 4 chronic kidney disease, or unspecified chronic kidney disease: Secondary | ICD-10-CM | POA: Diagnosis not present

## 2017-02-07 DIAGNOSIS — D631 Anemia in chronic kidney disease: Secondary | ICD-10-CM | POA: Diagnosis not present

## 2017-02-07 DIAGNOSIS — D696 Thrombocytopenia, unspecified: Secondary | ICD-10-CM | POA: Diagnosis not present

## 2017-02-07 DIAGNOSIS — Z8719 Personal history of other diseases of the digestive system: Secondary | ICD-10-CM | POA: Diagnosis not present

## 2017-02-07 DIAGNOSIS — R490 Dysphonia: Secondary | ICD-10-CM | POA: Diagnosis not present

## 2017-02-07 DIAGNOSIS — N2581 Secondary hyperparathyroidism of renal origin: Secondary | ICD-10-CM | POA: Diagnosis not present

## 2017-02-09 DIAGNOSIS — N183 Chronic kidney disease, stage 3 (moderate): Secondary | ICD-10-CM | POA: Diagnosis not present

## 2017-03-08 DIAGNOSIS — S46911A Strain of unspecified muscle, fascia and tendon at shoulder and upper arm level, right arm, initial encounter: Secondary | ICD-10-CM | POA: Diagnosis not present

## 2017-03-30 DIAGNOSIS — Z961 Presence of intraocular lens: Secondary | ICD-10-CM | POA: Diagnosis not present

## 2017-04-11 DIAGNOSIS — T148XXA Other injury of unspecified body region, initial encounter: Secondary | ICD-10-CM | POA: Diagnosis not present

## 2017-04-11 DIAGNOSIS — H6123 Impacted cerumen, bilateral: Secondary | ICD-10-CM | POA: Diagnosis not present

## 2017-04-11 DIAGNOSIS — H9113 Presbycusis, bilateral: Secondary | ICD-10-CM | POA: Diagnosis not present

## 2017-04-11 DIAGNOSIS — R140 Abdominal distension (gaseous): Secondary | ICD-10-CM | POA: Diagnosis not present

## 2017-04-11 DIAGNOSIS — R110 Nausea: Secondary | ICD-10-CM | POA: Diagnosis not present

## 2017-04-11 DIAGNOSIS — R609 Edema, unspecified: Secondary | ICD-10-CM | POA: Diagnosis not present

## 2017-06-08 DIAGNOSIS — H6123 Impacted cerumen, bilateral: Secondary | ICD-10-CM | POA: Diagnosis not present

## 2017-06-08 DIAGNOSIS — Z87891 Personal history of nicotine dependence: Secondary | ICD-10-CM | POA: Diagnosis not present

## 2017-06-08 DIAGNOSIS — Z7289 Other problems related to lifestyle: Secondary | ICD-10-CM | POA: Diagnosis not present

## 2017-06-08 DIAGNOSIS — H93293 Other abnormal auditory perceptions, bilateral: Secondary | ICD-10-CM | POA: Diagnosis not present

## 2017-06-23 DIAGNOSIS — Z6822 Body mass index (BMI) 22.0-22.9, adult: Secondary | ICD-10-CM | POA: Diagnosis not present

## 2017-06-23 DIAGNOSIS — S0990XA Unspecified injury of head, initial encounter: Secondary | ICD-10-CM | POA: Diagnosis not present

## 2017-07-20 DIAGNOSIS — Z1389 Encounter for screening for other disorder: Secondary | ICD-10-CM | POA: Diagnosis not present

## 2017-07-20 DIAGNOSIS — I10 Essential (primary) hypertension: Secondary | ICD-10-CM | POA: Diagnosis not present

## 2017-07-20 DIAGNOSIS — N183 Chronic kidney disease, stage 3 (moderate): Secondary | ICD-10-CM | POA: Diagnosis not present

## 2017-07-20 DIAGNOSIS — M810 Age-related osteoporosis without current pathological fracture: Secondary | ICD-10-CM | POA: Diagnosis not present

## 2017-07-20 DIAGNOSIS — L989 Disorder of the skin and subcutaneous tissue, unspecified: Secondary | ICD-10-CM | POA: Diagnosis not present

## 2017-07-20 DIAGNOSIS — Z0001 Encounter for general adult medical examination with abnormal findings: Secondary | ICD-10-CM | POA: Diagnosis not present

## 2017-07-20 DIAGNOSIS — L723 Sebaceous cyst: Secondary | ICD-10-CM | POA: Diagnosis not present

## 2017-07-20 DIAGNOSIS — D696 Thrombocytopenia, unspecified: Secondary | ICD-10-CM | POA: Diagnosis not present

## 2017-07-20 DIAGNOSIS — E7800 Pure hypercholesterolemia, unspecified: Secondary | ICD-10-CM | POA: Diagnosis not present

## 2017-08-15 DIAGNOSIS — Z1231 Encounter for screening mammogram for malignant neoplasm of breast: Secondary | ICD-10-CM | POA: Diagnosis not present

## 2017-08-17 DIAGNOSIS — L700 Acne vulgaris: Secondary | ICD-10-CM | POA: Diagnosis not present

## 2017-08-17 DIAGNOSIS — D485 Neoplasm of uncertain behavior of skin: Secondary | ICD-10-CM | POA: Diagnosis not present

## 2017-08-17 DIAGNOSIS — L570 Actinic keratosis: Secondary | ICD-10-CM | POA: Diagnosis not present

## 2017-08-17 DIAGNOSIS — L810 Postinflammatory hyperpigmentation: Secondary | ICD-10-CM | POA: Diagnosis not present

## 2017-08-17 DIAGNOSIS — Z23 Encounter for immunization: Secondary | ICD-10-CM | POA: Diagnosis not present

## 2017-08-18 ENCOUNTER — Other Ambulatory Visit: Payer: Self-pay

## 2017-08-18 NOTE — Patient Outreach (Signed)
[facility]) Care Management  08/18/2017  KHALIL BELOTE May 30, 1929 [id number]   Telephone call to patient for [employer] referral.  No answer.  HIPAA compliant voice message left.  Plan: RN CM will attempt patient again within 10 business days.  Jone Baseman, RN, MSN [facility] Care Management Care Management Coordinator Direct Line [phone] Toll Free: [phone]  Fax: [phone]

## 2017-08-21 ENCOUNTER — Other Ambulatory Visit: Payer: Self-pay

## 2017-08-21 NOTE — Patient Outreach (Signed)
[facility]) Care Management  08/21/2017  [name] [birth year] [id number]   2nd telephone call to patient for [employer] referral. No answer.  HIPAA compliant voice message left.  Plan: RN CM will attempt patient again within 3 business days.   Jone Baseman, RN, MSN [facility] Care Management Care Management Coordinator Direct Line [phone] Toll Free: [phone]  Fax: [phone]

## 2017-08-22 ENCOUNTER — Other Ambulatory Visit: Payer: Self-pay

## 2017-08-22 NOTE — Patient Outreach (Signed)
[facility]) Care Management  08/22/2017  MAIREN WALLENSTEIN Jan 27, 1929 [id number]   Referral Date: 08-18-17 Referral Source: [employer] Referral Reason: Falls and chronic conditions.   Outreach Attempt #3.  Spoke with patient she is able to verify HIPAA.  She states she is doing well for her age. Discussed with patient reason for call.  Patient reports she enjoyed her visit from the nurse practitioner. Discussed with patient [place].  However, patient declined services as she reports she is doing well and remains active and manages her health well.  Advised patient that I could send a letter and brochure for future reference. She is agreeable.     Social: Patient lives alone but has children out if town.  She reports having extended family in the area and friends she can count on. Patient still drives and is independent with activities of daily living.    Conditions: Patient admits to HTN and kidney disease but states she manages well.    Medications: Patient reports no problems with her medications.     Appointments: Patient saw physician last on Jul 03, 2017   Advanced Directives: Patient has advanced directive and living will.     Consent: Patient declined services at this time.   Plan: RN CM will send letter and brochure for future reference.  RN CM will close case at this time as patient declined. RN CM will notify care management assistant of case status.   Jone Baseman, RN, MSN [facility] Care Management Care Management Coordinator Direct Line [phone] Toll Free: [phone]  Fax: [phone]

## 2017-08-25 ENCOUNTER — Ambulatory Visit: Payer: Self-pay

## 2017-08-25 ENCOUNTER — Ambulatory Visit: Payer: Medicare Other

## 2017-09-18 DIAGNOSIS — D696 Thrombocytopenia, unspecified: Secondary | ICD-10-CM | POA: Diagnosis not present

## 2017-09-18 DIAGNOSIS — D6851 Activated protein C resistance: Secondary | ICD-10-CM | POA: Diagnosis not present

## 2017-09-18 DIAGNOSIS — H9193 Unspecified hearing loss, bilateral: Secondary | ICD-10-CM | POA: Diagnosis not present

## 2017-09-18 DIAGNOSIS — I129 Hypertensive chronic kidney disease with stage 1 through stage 4 chronic kidney disease, or unspecified chronic kidney disease: Secondary | ICD-10-CM | POA: Diagnosis not present

## 2017-09-18 DIAGNOSIS — Z8719 Personal history of other diseases of the digestive system: Secondary | ICD-10-CM | POA: Diagnosis not present

## 2017-09-18 DIAGNOSIS — R490 Dysphonia: Secondary | ICD-10-CM | POA: Diagnosis not present

## 2017-09-18 DIAGNOSIS — D631 Anemia in chronic kidney disease: Secondary | ICD-10-CM | POA: Diagnosis not present

## 2017-09-18 DIAGNOSIS — N183 Chronic kidney disease, stage 3 (moderate): Secondary | ICD-10-CM | POA: Diagnosis not present

## 2017-09-18 DIAGNOSIS — M810 Age-related osteoporosis without current pathological fracture: Secondary | ICD-10-CM | POA: Diagnosis not present

## 2017-09-18 DIAGNOSIS — E7800 Pure hypercholesterolemia, unspecified: Secondary | ICD-10-CM | POA: Diagnosis not present

## 2017-09-18 DIAGNOSIS — N2581 Secondary hyperparathyroidism of renal origin: Secondary | ICD-10-CM | POA: Diagnosis not present

## 2017-09-18 DIAGNOSIS — E118 Type 2 diabetes mellitus with unspecified complications: Secondary | ICD-10-CM | POA: Diagnosis not present

## 2017-10-11 DIAGNOSIS — N183 Chronic kidney disease, stage 3 (moderate): Secondary | ICD-10-CM | POA: Diagnosis not present

## 2017-11-16 DIAGNOSIS — L821 Other seborrheic keratosis: Secondary | ICD-10-CM | POA: Diagnosis not present

## 2017-11-16 DIAGNOSIS — D1801 Hemangioma of skin and subcutaneous tissue: Secondary | ICD-10-CM | POA: Diagnosis not present

## 2017-11-16 DIAGNOSIS — L814 Other melanin hyperpigmentation: Secondary | ICD-10-CM | POA: Diagnosis not present

## 2017-11-16 DIAGNOSIS — B078 Other viral warts: Secondary | ICD-10-CM | POA: Diagnosis not present

## 2017-11-16 DIAGNOSIS — L723 Sebaceous cyst: Secondary | ICD-10-CM | POA: Diagnosis not present

## 2017-12-06 DIAGNOSIS — L989 Disorder of the skin and subcutaneous tissue, unspecified: Secondary | ICD-10-CM | POA: Diagnosis not present

## 2017-12-19 DIAGNOSIS — H903 Sensorineural hearing loss, bilateral: Secondary | ICD-10-CM | POA: Diagnosis not present

## 2017-12-26 DIAGNOSIS — H903 Sensorineural hearing loss, bilateral: Secondary | ICD-10-CM | POA: Diagnosis not present

## 2018-01-18 DIAGNOSIS — N183 Chronic kidney disease, stage 3 (moderate): Secondary | ICD-10-CM | POA: Diagnosis not present

## 2018-01-18 DIAGNOSIS — I8001 Phlebitis and thrombophlebitis of superficial vessels of right lower extremity: Secondary | ICD-10-CM | POA: Diagnosis not present

## 2018-01-18 DIAGNOSIS — E7800 Pure hypercholesterolemia, unspecified: Secondary | ICD-10-CM | POA: Diagnosis not present

## 2018-01-18 DIAGNOSIS — I10 Essential (primary) hypertension: Secondary | ICD-10-CM | POA: Diagnosis not present

## 2018-03-20 DIAGNOSIS — N183 Chronic kidney disease, stage 3 (moderate): Secondary | ICD-10-CM | POA: Diagnosis not present

## 2018-03-20 DIAGNOSIS — I129 Hypertensive chronic kidney disease with stage 1 through stage 4 chronic kidney disease, or unspecified chronic kidney disease: Secondary | ICD-10-CM | POA: Diagnosis not present

## 2018-03-20 DIAGNOSIS — D631 Anemia in chronic kidney disease: Secondary | ICD-10-CM | POA: Diagnosis not present

## 2018-03-20 DIAGNOSIS — M810 Age-related osteoporosis without current pathological fracture: Secondary | ICD-10-CM | POA: Diagnosis not present

## 2018-03-20 DIAGNOSIS — D6851 Activated protein C resistance: Secondary | ICD-10-CM | POA: Diagnosis not present

## 2018-03-20 DIAGNOSIS — E7800 Pure hypercholesterolemia, unspecified: Secondary | ICD-10-CM | POA: Diagnosis not present

## 2018-03-20 DIAGNOSIS — N2581 Secondary hyperparathyroidism of renal origin: Secondary | ICD-10-CM | POA: Diagnosis not present

## 2018-03-20 DIAGNOSIS — E118 Type 2 diabetes mellitus with unspecified complications: Secondary | ICD-10-CM | POA: Diagnosis not present

## 2018-03-20 DIAGNOSIS — R490 Dysphonia: Secondary | ICD-10-CM | POA: Diagnosis not present

## 2018-03-20 DIAGNOSIS — D696 Thrombocytopenia, unspecified: Secondary | ICD-10-CM | POA: Diagnosis not present

## 2018-03-20 DIAGNOSIS — Z0000 Encounter for general adult medical examination without abnormal findings: Secondary | ICD-10-CM | POA: Diagnosis not present

## 2018-03-20 DIAGNOSIS — H9193 Unspecified hearing loss, bilateral: Secondary | ICD-10-CM | POA: Diagnosis not present

## 2018-04-02 DIAGNOSIS — J209 Acute bronchitis, unspecified: Secondary | ICD-10-CM | POA: Diagnosis not present

## 2018-04-12 DIAGNOSIS — Z961 Presence of intraocular lens: Secondary | ICD-10-CM | POA: Diagnosis not present

## 2018-04-12 DIAGNOSIS — H524 Presbyopia: Secondary | ICD-10-CM | POA: Diagnosis not present

## 2018-04-12 DIAGNOSIS — H52203 Unspecified astigmatism, bilateral: Secondary | ICD-10-CM | POA: Diagnosis not present

## 2018-04-13 DIAGNOSIS — J069 Acute upper respiratory infection, unspecified: Secondary | ICD-10-CM | POA: Diagnosis not present

## 2018-04-13 DIAGNOSIS — B9789 Other viral agents as the cause of diseases classified elsewhere: Secondary | ICD-10-CM | POA: Diagnosis not present

## 2018-04-16 ENCOUNTER — Emergency Department (HOSPITAL_COMMUNITY): Payer: PPO

## 2018-04-16 ENCOUNTER — Encounter (HOSPITAL_COMMUNITY): Payer: Self-pay | Admitting: Emergency Medicine

## 2018-04-16 ENCOUNTER — Other Ambulatory Visit: Payer: Self-pay

## 2018-04-16 ENCOUNTER — Inpatient Hospital Stay (HOSPITAL_COMMUNITY)
Admission: EM | Admit: 2018-04-16 | Discharge: 2018-04-18 | DRG: 389 | Disposition: A | Discharge status: Home / Self Care | Payer: PPO | Attending: Internal Medicine | Admitting: Internal Medicine

## 2018-04-16 DIAGNOSIS — Z7982 Long term (current) use of aspirin: Secondary | ICD-10-CM | POA: Diagnosis not present

## 2018-04-16 DIAGNOSIS — I10 Essential (primary) hypertension: Secondary | ICD-10-CM | POA: Diagnosis not present

## 2018-04-16 DIAGNOSIS — R1084 Generalized abdominal pain: Secondary | ICD-10-CM

## 2018-04-16 DIAGNOSIS — N183 Chronic kidney disease, stage 3 (moderate): Secondary | ICD-10-CM

## 2018-04-16 DIAGNOSIS — M810 Age-related osteoporosis without current pathological fracture: Secondary | ICD-10-CM | POA: Diagnosis not present

## 2018-04-16 DIAGNOSIS — N281 Cyst of kidney, acquired: Secondary | ICD-10-CM | POA: Diagnosis present

## 2018-04-16 DIAGNOSIS — J40 Bronchitis, not specified as acute or chronic: Secondary | ICD-10-CM | POA: Diagnosis not present

## 2018-04-16 DIAGNOSIS — I129 Hypertensive chronic kidney disease with stage 1 through stage 4 chronic kidney disease, or unspecified chronic kidney disease: Secondary | ICD-10-CM | POA: Diagnosis not present

## 2018-04-16 DIAGNOSIS — K56609 Unspecified intestinal obstruction, unspecified as to partial versus complete obstruction: Secondary | ICD-10-CM | POA: Diagnosis not present

## 2018-04-16 DIAGNOSIS — K56600 Partial intestinal obstruction, unspecified as to cause: Secondary | ICD-10-CM | POA: Diagnosis not present

## 2018-04-16 DIAGNOSIS — R05 Cough: Secondary | ICD-10-CM | POA: Diagnosis not present

## 2018-04-16 DIAGNOSIS — Z79899 Other long term (current) drug therapy: Secondary | ICD-10-CM | POA: Diagnosis not present

## 2018-04-16 DIAGNOSIS — Z87891 Personal history of nicotine dependence: Secondary | ICD-10-CM

## 2018-04-16 DIAGNOSIS — E878 Other disorders of electrolyte and fluid balance, not elsewhere classified: Secondary | ICD-10-CM | POA: Diagnosis not present

## 2018-04-16 DIAGNOSIS — R0602 Shortness of breath: Secondary | ICD-10-CM | POA: Diagnosis not present

## 2018-04-16 DIAGNOSIS — J439 Emphysema, unspecified: Secondary | ICD-10-CM | POA: Diagnosis not present

## 2018-04-16 DIAGNOSIS — E871 Hypo-osmolality and hyponatremia: Secondary | ICD-10-CM

## 2018-04-16 DIAGNOSIS — N189 Chronic kidney disease, unspecified: Secondary | ICD-10-CM | POA: Diagnosis present

## 2018-04-16 DIAGNOSIS — R112 Nausea with vomiting, unspecified: Secondary | ICD-10-CM | POA: Diagnosis not present

## 2018-04-16 DIAGNOSIS — D6851 Activated protein C resistance: Secondary | ICD-10-CM | POA: Diagnosis not present

## 2018-04-16 DIAGNOSIS — R1111 Vomiting without nausea: Secondary | ICD-10-CM | POA: Diagnosis not present

## 2018-04-16 DIAGNOSIS — D72829 Elevated white blood cell count, unspecified: Secondary | ICD-10-CM | POA: Diagnosis present

## 2018-04-16 DIAGNOSIS — Z90710 Acquired absence of both cervix and uterus: Secondary | ICD-10-CM | POA: Diagnosis not present

## 2018-04-16 DIAGNOSIS — E785 Hyperlipidemia, unspecified: Secondary | ICD-10-CM | POA: Diagnosis not present

## 2018-04-16 HISTORY — DX: Unspecified intestinal obstruction, unspecified as to partial versus complete obstruction: K56.609

## 2018-04-16 LAB — COMPREHENSIVE METABOLIC PANEL
ALK PHOS: 82 U/L (ref 38–126)
ALT: 22 U/L (ref 0–44)
ANION GAP: 13 (ref 5–15)
AST: 21 U/L (ref 15–41)
Albumin: 3.7 g/dL (ref 3.5–5.0)
BILIRUBIN TOTAL: 1.3 mg/dL — AB (ref 0.3–1.2)
BUN: 34 mg/dL — ABNORMAL HIGH (ref 8–23)
CALCIUM: 10.3 mg/dL (ref 8.9–10.3)
CO2: 30 mmol/L (ref 22–32)
Chloride: 91 mmol/L — ABNORMAL LOW (ref 98–111)
Creatinine, Ser: 1.15 mg/dL — ABNORMAL HIGH (ref 0.44–1.00)
GFR calc Af Amer: 48 mL/min — ABNORMAL LOW (ref >60)
GFR, EST NON AFRICAN AMERICAN: 42 mL/min — AB (ref >60)
Glucose, Bld: 173 mg/dL — ABNORMAL HIGH (ref 70–99)
POTASSIUM: 4.1 mmol/L (ref 3.5–5.1)
Sodium: 134 mmol/L — ABNORMAL LOW (ref 135–145)
TOTAL PROTEIN: 6.8 g/dL (ref 6.5–8.1)

## 2018-04-16 LAB — LIPASE, BLOOD: Lipase: 59 U/L — ABNORMAL HIGH (ref 11–51)

## 2018-04-16 LAB — CBC
HEMATOCRIT: 44.9 % (ref 36.0–46.0)
HEMOGLOBIN: 14.7 g/dL (ref 12.0–15.0)
MCH: 29.5 pg (ref 26.0–34.0)
MCHC: 32.7 g/dL (ref 30.0–36.0)
MCV: 90.2 fL (ref 78.0–100.0)
Platelets: 194 10*3/uL (ref 150–400)
RBC: 4.98 MIL/uL (ref 3.87–5.11)
RDW: 14.5 % (ref 11.5–15.5)
WBC: 18.5 10*3/uL — AB (ref 4.0–10.5)

## 2018-04-16 MED ORDER — ENOXAPARIN SODIUM 30 MG/0.3ML ~~LOC~~ SOLN
30.0000 mg | SUBCUTANEOUS | Status: DC
  Administered 2018-04-16: 30 mg via SUBCUTANEOUS
  Filled 2018-04-16: qty 0.3

## 2018-04-16 MED ORDER — SODIUM CHLORIDE 0.9 % IV SOLN
INTRAVENOUS | Status: DC
  Administered 2018-04-16: 11:00:00 via INTRAVENOUS

## 2018-04-16 MED ORDER — SODIUM FLUORIDE 1.1 % DT GEL: 1.0000 "application " | Freq: Every day | DENTAL | Status: DC

## 2018-04-16 MED ORDER — ONDANSETRON HCL 4 MG PO TABS
4.0000 mg | ORAL_TABLET | Freq: Four times a day (QID) | ORAL | Status: DC | PRN
Start: 2018-04-16 — End: 2018-04-18

## 2018-04-16 MED ORDER — SODIUM CHLORIDE 0.9 % IV BOLUS
1000.0000 mL | Freq: Once | INTRAVENOUS | Status: AC
  Administered 2018-04-16: 1000 mL via INTRAVENOUS

## 2018-04-16 MED ORDER — IOPAMIDOL (ISOVUE-300) INJECTION 61%: 30.0000 mL | Freq: Once | INTRAVENOUS | Status: DC | PRN

## 2018-04-16 MED ORDER — ONDANSETRON HCL 4 MG/2ML IJ SOLN: 4.0000 mg | Freq: Four times a day (QID) | INTRAMUSCULAR | Status: DC | PRN

## 2018-04-16 MED ORDER — ACETAMINOPHEN 650 MG RE SUPP: 650.0000 mg | Freq: Four times a day (QID) | RECTAL | Status: DC | PRN

## 2018-04-16 MED ORDER — ONDANSETRON HCL 4 MG/2ML IJ SOLN
4.0000 mg | Freq: Once | INTRAMUSCULAR | Status: AC
Start: 2018-04-16 — End: 2018-04-16
  Administered 2018-04-16: 4 mg via INTRAVENOUS
  Filled 2018-04-16: qty 2

## 2018-04-16 MED ORDER — MORPHINE SULFATE (PF) 2 MG/ML IV SOLN: 1.0000 mg | INTRAVENOUS | Status: DC | PRN

## 2018-04-16 MED ORDER — HYDRALAZINE HCL 20 MG/ML IJ SOLN: 10.0000 mg | Freq: Four times a day (QID) | INTRAMUSCULAR | Status: DC | PRN

## 2018-04-16 MED ORDER — ACETAMINOPHEN 325 MG PO TABS: 650.0000 mg | ORAL_TABLET | Freq: Four times a day (QID) | ORAL | Status: DC | PRN

## 2018-04-16 MED ORDER — ONDANSETRON 4 MG PO TBDP: 4.0000 mg | ORAL_TABLET | Freq: Once | ORAL | Status: DC | PRN

## 2018-04-16 MED ORDER — BISACODYL 10 MG RE SUPP
10.0000 mg | Freq: Once | RECTAL | Status: DC
  Filled 2018-04-16: qty 1

## 2018-04-16 MED ORDER — IOHEXOL 300 MG/ML  SOLN
30.0000 mL | Freq: Once | INTRAMUSCULAR | Status: AC | PRN
  Administered 2018-04-16: 30 mL via ORAL

## 2018-04-16 MED ORDER — IPRATROPIUM BROMIDE 0.06 % NA SOLN
1.0000 | Freq: Three times a day (TID) | NASAL | Status: DC
  Administered 2018-04-17: 1 via NASAL
  Filled 2018-04-16: qty 15

## 2018-04-16 NOTE — ED Notes (Signed)
Pt made an unsuccessful attempt to provide a urine specimen.

## 2018-04-16 NOTE — H&P (Addendum)
[facility] History and Physical  [name] JTT:[id number] DOB: 07/29/1929 DOA: 04/16/2018  PCP: Carol Ada, MD  Patient coming from: Home  Chief Complaint: Abdominal pain  HPI: [name] is a 84 y.o. female with a medical history of hypertension, chronic kidney disease, small bowel obstruction in the past, who presented to the emergency department with complaints of abdominal distention and pain which is been ongoing for only 1 day.  Patient states she did have some green-colored vomit and felt better after she vomited.  She did have a bowel movement this morning prior to admission.  Patient currently denies chest pain, shortness of breath, diarrhea or constipation, pain with urination or increased frequency, headache or dizziness, recent travel or illness.  Patient does admit to having a similar episode back in 2015 which required surgery.  ED Course: CT scan of the abdomen showed partial small bowel obstruction.  [facility] called for admission.  Review of Systems:  All other systems reviewed and are negative.   Past Medical History:  Diagnosis Date  . Chronic kidney disease   . DVT (deep venous thrombosis) ([place])   . Dysphonia   . Factor V Leiden ([place])   . Hypertension   . Osteoporosis   . PNA (pneumonia) 12/31/2012  . Pneumonitis 03/19/2013  . Small bowel obstruction [facility])     Past Surgical History:  Procedure Laterality Date  . ABDOMINAL HYSTERECTOMY    . APPENDECTOMY    . LAPAROTOMY N/A 01/01/2013   Procedure: EXPLORATORY LAPAROTOMY removal of smal bowel foreign body;  Surgeon: Madilyn Hook, DO;  Location: [facility] ORS;  Service: General;  Laterality: N/A;  . TONSILLECTOMY      Social History:  reports that she quit smoking about 18 years ago. She has never used smokeless tobacco. She reports that she drinks alcohol. She reports that she does not use drugs.  Allergies  Allergen Reactions  . Diovan [Valsartan] Other (See Comments)    gas  . Hctz  [Hydrochlorothiazide]     gas  . Tekamlo [Aliskiren-Amlodipine]     gas  . Valturna [Aliskiren-Valsartan]     Gas     History reviewed. No pertinent family history.  Denies history of heart disease, hypertension.  Prior to Admission medications   Medication Sig Start Date End Date Taking? Authorizing Provider  acetaminophen (TYLENOL) 325 MG tablet Take 2 tablets (650 mg total) by mouth every 4 (four) hours as needed. Patient taking differently: Take 650 mg by mouth every 4 (four) hours as needed for mild pain.  01/21/13  Yes Whiteheart, Cristal Ford, NP  amLODipine (NORVASC) 10 MG tablet Take 10 mg by mouth daily.   Yes [provider]  aspirin EC 81 MG tablet Take 81 mg by mouth at bedtime.    Yes [provider]  calcium carbonate (TUMS - DOSED IN MG ELEMENTAL CALCIUM) 500 MG chewable tablet Chew 1-2 tablets (200-400 mg of elemental calcium total) by mouth 3 (three) times daily as needed. Patient taking differently: Chew 1-2 tablets by mouth 3 (three) times daily as needed for indigestion.  01/21/13  Yes Whiteheart, Cristal Ford, NP  cholecalciferol (VITAMIN D) 1000 units tablet Take 1,000 Units by mouth at bedtime.   Yes [provider]  DENTAGEL 1.1 % GEL dental gel Take 1 application by mouth daily. Place on toothbrush and brush daily for 2 minutes to prevent tooth decay/sensitivity 01/16/18  Yes [provider]  ferrous sulfate 325 (65 FE) MG tablet Take 325 mg by  mouth daily with breakfast. Unknown dose   Yes [provider]  ipratropium (ATROVENT) 0.06 % nasal spray Place 1 spray into both nostrils 3 (three) times daily. 04/13/18  Yes [provider]  irbesartan (AVAPRO) 300 MG tablet Take 300 mg by mouth daily.   Yes [provider]  lactobacillus acidophilus (BACID) TABS tablet Take 1 tablet by mouth at bedtime. Uses Cultural brand    Yes [provider]  loratadine (CLARITIN) 10 MG tablet Take 10 mg by mouth daily as  needed for allergies.    Yes [provider]  Multiple Vitamin (MULTIVITAMIN WITH MINERALS) TABS Take 1 tablet by mouth daily.   Yes [provider]  psyllium (METAMUCIL) 58.6 % packet Take 1 packet by mouth at bedtime.    Yes [provider]  rosuvastatin (CRESTOR) 5 MG tablet Take 5 mg by mouth at bedtime.    Yes [provider]    Physical Exam: Vitals:   04/16/18 0730 04/16/18 0847  BP: (!) 161/75 (!) 148/67  Pulse: 81 69  Resp: 15 16  Temp:    SpO2: 93% 94%     General: Well developed, well nourished, NAD, appears stated age  HEENT: NCAT, PERRLA, EOMI, Anicteic Sclera, mucous membranes moist.   Neck: Supple, no JVD, no masses  Cardiovascular: S1 S2 auscultated, no rubs, murmurs or gallops. Regular rate and rhythm.  Respiratory: Clear to auscultation bilaterally with equal chest rise  Abdomen: Soft, mildly TTP, mildly distended, + bowel sounds  Extremities: warm dry without cyanosis clubbing. Trace LE edema L>R (chronic per patient)  Neuro: AAOx3, cranial nerves grossly intact. Strength 5/5 in patient's upper and lower extremities bilaterally  Skin: Without rashes exudates or nodules  Psych: Normal affect and demeanor with intact judgement and insight  Labs on Admission: I have personally reviewed following labs and imaging studies CBC: Recent Labs  Lab 04/16/18 0030  WBC 18.5*  HGB 14.7  HCT 44.9  MCV 90.2  PLT 017   Basic Metabolic Panel: Recent Labs  Lab 04/16/18 0030  NA 134*  K 4.1  CL 91*  CO2 30  GLUCOSE 173*  BUN 34*  CREATININE 1.15*  CALCIUM 10.3   GFR: Estimated Creatinine Clearance: 28.5 mL/min (A) (by C-G formula based on SCr of 1.15 mg/dL (H)). Liver Function Tests: Recent Labs  Lab 04/16/18 0030  AST 21  ALT 22  ALKPHOS 82  BILITOT 1.3*  PROT 6.8  ALBUMIN 3.7   Recent Labs  Lab 04/16/18 0030  LIPASE 59*   No results for input(s): AMMONIA in the last 168 hours. Coagulation  Profile: No results for input(s): INR, PROTIME in the last 168 hours. Cardiac Enzymes: No results for input(s): CKTOTAL, CKMB, CKMBINDEX, TROPONINI in the last 168 hours. BNP (last 3 results) No results for input(s): PROBNP in the last 8760 hours. HbA1C: No results for input(s): HGBA1C in the last 72 hours. CBG: No results for input(s): GLUCAP in the last 168 hours. Lipid Profile: No results for input(s): CHOL, HDL, LDLCALC, TRIG, CHOLHDL, LDLDIRECT in the last 72 hours. Thyroid Function Tests: No results for input(s): TSH, T4TOTAL, FREET4, T3FREE, THYROIDAB in the last 72 hours. Anemia Panel: No results for input(s): VITAMINB12, FOLATE, FERRITIN, TIBC, IRON, RETICCTPCT in the last 72 hours. Urine analysis:    Component Value Date/Time   COLORURINE YELLOW 04/06/2014 0715   APPEARANCEUR CLEAR 04/06/2014 0715   LABSPEC 1.019 04/06/2014 0715   PHURINE 7.5 04/06/2014 0715   GLUCOSEU NEGATIVE 04/06/2014 0715  HGBUR NEGATIVE 04/06/2014 0715   BILIRUBINUR NEGATIVE 04/06/2014 0715   KETONESUR NEGATIVE 04/06/2014 0715   PROTEINUR 30 (A) 04/06/2014 0715   UROBILINOGEN 0.2 04/06/2014 0715   NITRITE NEGATIVE 04/06/2014 0715   LEUKOCYTESUR SMALL (A) 04/06/2014 0715   Sepsis Labs: @LABRCNTIP (procalcitonin:4,lacticidven:4) )No results found for this or any previous visit (from the past 240 hour(s)).   Radiological Exams on Admission: Ct Abdomen Pelvis Wo Contrast  Result Date: 04/16/2018 CLINICAL DATA:  Abdominal pain and vomiting. History of small bowel obstruction. EXAM: CT ABDOMEN AND PELVIS WITHOUT CONTRAST TECHNIQUE: Multidetector CT imaging of the abdomen and pelvis was performed following the standard protocol without IV contrast. COMPARISON:  CT abdomen pelvis dated April 05, 2014. FINDINGS: Lower chest: Bibasilar atelectasis/scarring. Unchanged small pericardial effusion. Hepatobiliary: No focal liver abnormality. Cholelithiasis. No gallbladder wall thickening. Chronic mild common  bile duct dilatation. Pancreas: Unremarkable. No pancreatic ductal dilatation or surrounding inflammatory changes. Spleen: Normal in size without focal abnormality. Adrenals/Urinary Tract: The adrenal glands are unremarkable. Unchanged bilateral renal cysts, including a 1.1 cm hyperdense lesion in the lower pole of the right kidney, previously characterized as a cyst on ultrasound. No renal or ureteral calculi. No hydronephrosis. The bladder is unremarkable. Stomach/Bowel: Distended stomach. Dilated loops of proximal small bowel with gradual transition to nondilated small bowel in the central mid abdomen. Mid to distal small bowel loops are not entirely decompressed. No wall thickening or pneumatosis. Colonic diverticulosis. Normal appendix. Vascular/Lymphatic: Aortic atherosclerosis. Unchanged ectasia of the infrarenal abdominal aorta, measuring 2.5 cm. No enlarged abdominal or pelvic lymph nodes. Reproductive: Status post hysterectomy. Stable left adnexal cyst measuring 1.7 cm. Interval increase in size of right adnexal cysts, now measuring up to 4.0 cm, previously 3.0 cm. Other: Trace free fluid in the pelvis.  No pneumoperitoneum. Musculoskeletal: No acute or significant osseous findings. IMPRESSION: 1. Multiple dilated loops of proximal small bowel with gradual transition to non-dilated small bowel in the central mid abdomen. Mid to distal small bowel loops are not decompressed. Findings are favored to reflect partial small bowel obstruction. 2. Interval increase in size of right adnexal cyst, now measuring up to 4 cm. Recommend non-emergent pelvic ultrasound for further evaluation if not previously performed. 3. Cholelithiasis. 4. Unchanged ectasia of the infrarenal abdominal aorta, measuring 2.5 cm. Recommend followup by ultrasound in 5 years. This recommendation follows ACR consensus guidelines: White Paper of the ACR Incidental Findings Committee II on Vascular Findings. J Am Coll Radiol 2013; 10:789-794.  5.  Aortic atherosclerosis (ICD10-I70.0). Electronically Signed   By: Titus Dubin M.D.   On: 04/16/2018 08:23   Dg Chest 2 View  Result Date: 04/16/2018 CLINICAL DATA:  84 year old female with shortness of breath and cough. EXAM: CHEST - 2 VIEW COMPARISON:  Chest radiograph dated 04/08/2014 FINDINGS: No focal consolidation, pleural effusion, or pneumothorax. Mild cardiomegaly. Atherosclerotic calcification of the aortic arch. No acute osseous pathology. Osteopenia. IMPRESSION: No active cardiopulmonary disease. Electronically Signed   By: Anner Crete M.D.   On: 04/16/2018 03:56    EKG: None  Assessment/Plan Abdominal pain with vomiting secondary to partial small bowel obstruction -CT abdomen pelvis showed multiple dilated loops of proximal small bowel with gradual transition to nondilated small bowel in the central mid abdomen.  Findings reflect partial small bowel obstruction. -Currently patient with positive bowel sounds and states that she has not had vomiting since yesterday evening -Will hold off on NG tube placement -Continue conservative management with IV fluids, antiemetics, pain control, and bowel rest -Will order Dulcolax  suppository -Will order abd xray for 04/17/2018  Recent diagnosis of bronchitis -Chest x-ray unremarkable -States she finished 10 days of antibiotics however continues to have cough -Once SBO has resolved, patient may benefit from antitussives -Continue Atrovent as needed  Leukocytosis -Secondary to the above versus recent bronchitis -Continue to monitor CBC -Chest x-ray reviewed and unremarkable, patient with no urinary frequency or dysuria.  Currently afebrile -Will hold off on antibiotics  Mild hyponatremia and hypochloremia -Suspect secondary to vomiting -Will place on IV fluids and continue to monitor BMP  Essential hypertension -Hold amlodipine and irbesartan as patient is currently n.p.o. -Placed on IV hydralazine as needed  Chronic  kidney disease, stage III -Creatinine appears to be stable, continue to monitor BMP  Hyperlipidemia -Hold statin  Right adnexal cyst -Noted on CT abdomen pelvis, increased to 4 cm in size. -Nonemergent pelvic ultrasound recommended -Follow-up with outpatient PCP or gynecologist for further work-up  Ectasia of the infrarenal abdominal aorta -Unchanged, 2.5 cm noted on CT scan, recommended ultrasound follow-up in 5 years  DVT prophylaxis: Lovenox  Code Status: Full  Family Communication: None at bedside. Admission, patients condition and plan of care including tests being ordered have been discussed with the patient, who indicates understanding and agrees with the plan and Code Status.  Disposition Plan: Home   Consults called: None   Admission status: Admitted   Time spent: 70 minutes  Joah Patlan D.O. [facility] Pager [phone]  If 7PM-7AM, please contact night-coverage [email or web address] Password [facility] 04/16/2018, 9:31 AM

## 2018-04-16 NOTE — ED Notes (Signed)
Delay in transport related to XR being completed prior to patient going upstairs.

## 2018-04-16 NOTE — ED Triage Notes (Signed)
Pt reports onset of abdominal pain with vomiting that is similar to when pt had small bowel obstruction. Pt reports having 3 episodes of vomiting.

## 2018-04-16 NOTE — ED Notes (Signed)
Pt. Made aware for the need of urine specimen. 

## 2018-04-16 NOTE — Progress Notes (Signed)
Notified at [phone] re patient/ED was on the phone.

## 2018-04-16 NOTE — ED Provider Notes (Signed)
[facility] DEPT Provider Note   CSN: [id number] Arrival date & time: 04/16/18  0000  Time seen 02:58 AM   History   Chief Complaint Chief Complaint  Patient presents with  . Abdominal Pain  . Emesis    HPI [name] is a 84 y.o. female.  HPI very pleasant elderly female who states this afternoon, July 14 she started getting diffuse abdominal bloating and tension with discomfort.  She states about 11:30 AM she started vomiting and it was green, she states she felt better after she vomited however then it recurred.  She states her last BM was earlier in the morning on July 15 and was normal.  She denies diarrhea.  She states she had something similar in 2015 when she had a bowel blockage and required an NG tube.  PCP Carol Ada, MD   Past Medical History:  Diagnosis Date  . Chronic kidney disease   . DVT (deep venous thrombosis) ([place])   . Dysphonia   . Factor V Leiden ([place])   . Hypertension   . Osteoporosis   . PNA (pneumonia) 12/31/2012  . Pneumonitis 03/19/2013  . Small bowel obstruction [facility])     Patient Active Problem List   Diagnosis Date Noted  . Factor V Leiden ([place])   . Hyperlipidemia 03/19/2013  . Diabetes mellitus ([place]) 03/19/2013  . Anemia in chronic kidney disease(285.21) 02/14/2013  . Chronic kidney disease, unspecified 02/14/2013  . Secondary renovascular hypertension, benign 02/14/2013  . SBO (small bowel obstruction) ([place]) 12/31/2012  . HTN (hypertension) 12/31/2012  . Dehydration 12/31/2012    Past Surgical History:  Procedure Laterality Date  . ABDOMINAL HYSTERECTOMY    . APPENDECTOMY    . LAPAROTOMY N/A 01/01/2013   Procedure: EXPLORATORY LAPAROTOMY removal of smal bowel foreign body;  Surgeon: Madilyn Hook, DO;  Location: [facility] ORS;  Service: General;  Laterality: N/A;  . TONSILLECTOMY       OB History   None      Home Medications    Prior to Admission medications   Medication Sig Start Date End  Date Taking? Authorizing Provider  acetaminophen (TYLENOL) 325 MG tablet Take 2 tablets (650 mg total) by mouth every 4 (four) hours as needed. Patient taking differently: Take 650 mg by mouth every 4 (four) hours as needed for mild pain.  01/21/13  Yes Whiteheart, Cristal Ford, NP  amLODipine (NORVASC) 10 MG tablet Take 10 mg by mouth daily.   Yes [provider]  aspirin EC 81 MG tablet Take 81 mg by mouth at bedtime.    Yes [provider]  calcium carbonate (TUMS - DOSED IN MG ELEMENTAL CALCIUM) 500 MG chewable tablet Chew 1-2 tablets (200-400 mg of elemental calcium total) by mouth 3 (three) times daily as needed. Patient taking differently: Chew 1-2 tablets by mouth 3 (three) times daily as needed for indigestion.  01/21/13  Yes Whiteheart, Cristal Ford, NP  cholecalciferol (VITAMIN D) 1000 units tablet Take 1,000 Units by mouth at bedtime.   Yes [provider]  DENTAGEL 1.1 % GEL dental gel Take 1 application by mouth daily. Place on toothbrush and brush daily for 2 minutes to prevent tooth decay/sensitivity 01/16/18  Yes [provider]  ferrous sulfate 325 (65 FE) MG tablet Take 325 mg by mouth daily with breakfast. Unknown dose   Yes [provider]  ipratropium (ATROVENT) 0.06 % nasal spray Place 1 spray into both nostrils 3 (three) times daily. 04/13/18  Yes [provider]  irbesartan (AVAPRO) 300 MG tablet Take 300 mg by mouth daily.   Yes [provider]  lactobacillus acidophilus (BACID) TABS tablet Take 1 tablet by mouth at bedtime. Uses [employer] brand    Yes [provider]  loratadine (CLARITIN) 10 MG tablet Take 10 mg by mouth daily as needed for allergies.    Yes [provider]  Multiple Vitamin (MULTIVITAMIN WITH MINERALS) TABS Take 1 tablet by mouth daily.   Yes [provider]  psyllium (METAMUCIL) 58.6 % packet Take 1 packet by mouth at bedtime.    Yes [provider]  rosuvastatin  (CRESTOR) 5 MG tablet Take 5 mg by mouth at bedtime.    Yes [provider]    Family History History reviewed. No pertinent family history.  Social History Social History   Tobacco Use  . Smoking status: Former Smoker    Last attempt to quit: 05/04/1999    Years since quitting: 18.9  . Smokeless tobacco: Never Used  Substance Use Topics  . Alcohol use: Yes    Comment: occasionally  . 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    Physical Exam Updated Vital Signs BP 139/79   Pulse 80   Temp 97.8 F (36.6 C) (Oral)   Resp 15   Ht 5\' 3"  (1.6 m)   Wt 56.7 kg (125 lb)   SpO2 99%   BMI 22.14 kg/m   Vital signs normal    Physical Exam  Constitutional: She is oriented to person, place, and time.  Frail elderly pleasant female  HENT:  Head: Normocephalic and atraumatic.  Right Ear: External ear normal.  Left Ear: External ear normal.  Nose: Nose normal.  Mouth/Throat: Mucous membranes are dry. No oropharyngeal exudate, posterior oropharyngeal edema or posterior oropharyngeal erythema.  Eyes: Pupils are equal, round, and reactive to light. Conjunctivae and EOM are normal.  Neck: Normal range of motion. Neck supple.  Cardiovascular: Normal rate, regular rhythm and normal heart sounds.  Pulmonary/Chest: Effort normal and breath sounds normal. No respiratory distress.  Abdominal: Soft. Bowel sounds are normal. She exhibits distension. There is generalized tenderness.  Musculoskeletal: She exhibits no edema or deformity.  Neurological: She is alert and oriented to person, place, and time. No cranial nerve deficit.  Skin: Skin is warm and dry. No rash noted. No erythema.  Psychiatric: She has a normal mood and affect. Her behavior is  normal. Thought content normal.  Nursing note and vitals reviewed.    ED Treatments / Results  Labs (all labs ordered are listed, but only abnormal results are displayed) Results for orders placed or performed during the hospital encounter of 04/16/18  Lipase, blood  Result Value Ref Range   Lipase 59 (H) 11 - 51 U/L  Comprehensive metabolic panel  Result Value Ref Range   Sodium 134 (L) 135 - 145 mmol/L   Potassium 4.1 3.5 - 5.1 mmol/L   Chloride 91 (L) 98 - 111 mmol/L   CO2 30 22 - 32 mmol/L   Glucose, Bld 173 (H) 70 - 99 mg/dL   BUN 34 (H) 8 - 23 mg/dL   Creatinine, Ser 1.15 (H) 0.44 - 1.00 mg/dL   Calcium 10.3 8.9 - 10.3 mg/dL   Total Protein 6.8 6.5 - 8.1 g/dL  Albumin 3.7 3.5 - 5.0 g/dL   AST 21 15 - 41 U/L   ALT 22 0 - 44 U/L   Alkaline Phosphatase 82 38 - 126 U/L   Total Bilirubin 1.3 (H) 0.3 - 1.2 mg/dL   GFR calc non Af Amer 42 (L) >60 mL/min   GFR calc Af Amer 48 (L) >60 mL/min   Anion gap 13 5 - 15  CBC  Result Value Ref Range   WBC 18.5 (H) 4.0 - 10.5 K/uL   RBC 4.98 3.87 - 5.11 MIL/uL   Hemoglobin 14.7 12.0 - 15.0 g/dL   HCT 44.9 36.0 - 46.0 %   MCV 90.2 78.0 - 100.0 fL   MCH 29.5 26.0 - 34.0 pg   MCHC 32.7 30.0 - 36.0 g/dL   RDW 14.5 11.5 - 15.5 %   Platelets 194 150 - 400 K/uL   Laboratory interpretation all normal except leukocytosis, new renal insufficiency c/w dehydration    EKG None  Radiology Dg Chest 2 View  Result Date: 04/16/2018 CLINICAL DATA:  84 year old female with shortness of breath and cough. EXAM: CHEST - 2 VIEW COMPARISON:  Chest radiograph dated 04/08/2014 FINDINGS: No focal consolidation, pleural effusion, or pneumothorax. Mild cardiomegaly. Atherosclerotic calcification of the aortic arch. No acute osseous pathology. Osteopenia. IMPRESSION: No active cardiopulmonary disease. Electronically Signed   By: Anner Crete M.D.   On: 04/16/2018 03:56    Procedures Procedures (including critical care time)  Medications Ordered  in ED Medications  ondansetron (ZOFRAN-ODT) disintegrating tablet 4 mg (has no administration in time range)  sodium chloride 0.9 % bolus 1,000 mL (0 mLs Intravenous Stopped 04/16/18 0430)  ondansetron (ZOFRAN) injection 4 mg (4 mg Intravenous Given 04/16/18 0328)  iohexol (OMNIPAQUE) 300 MG/ML solution 30 mL (30 mLs Oral Contrast Given 04/16/18 0745)     Initial Impression / Assessment and Plan / ED Course  I have reviewed the triage vital signs and the nursing notes.  Pertinent labs & imaging results that were available during my care of the patient were reviewed by me and considered in my medical decision making (see chart for details).     Patient was given IV fluids, IV nausea medication.  She declined pain medication at this time.  She was shown how to call for the nurse to let her know if she changes her mind.  CT scan was done to see if she has bowel obstruction again.  07:55 AM still waiting for her AP CT to be done. Pt turned over to Dr Madison Hickman at change of shift to get the results of her CT scan and disposition.   Final Clinical Impressions(s) / ED Diagnoses   Final diagnoses:  Generalized abdominal pain  Nausea and vomiting, intractability of vomiting not specified, unspecified vomiting type    Disposition pending  Rolland Porter, MD, Barbette Or, MD 04/16/18 [phone]

## 2018-04-16 NOTE — ED Provider Notes (Signed)
Patient sent to me by Dr. Tomi Bamberger pending abdominal CT.  Abdominal CT consistent with partial small bowel obstruction.  Patient to be admitted by the hospitalist   Lacretia Leigh, MD 04/16/18 [phone]

## 2018-04-16 NOTE — Progress Notes (Signed)
PT demonstrated verbal and hands on understanding of Flutter device- PC at this time.

## 2018-04-17 ENCOUNTER — Inpatient Hospital Stay (HOSPITAL_COMMUNITY): Payer: PPO

## 2018-04-17 DIAGNOSIS — K56600 Partial intestinal obstruction, unspecified as to cause: Principal | ICD-10-CM

## 2018-04-17 LAB — CBC
HCT: 37.1 % (ref 36.0–46.0)
HEMOGLOBIN: 11.7 g/dL — AB (ref 12.0–15.0)
MCH: 29.5 pg (ref 26.0–34.0)
MCHC: 31.5 g/dL (ref 30.0–36.0)
MCV: 93.5 fL (ref 78.0–100.0)
Platelets: 146 10*3/uL — ABNORMAL LOW (ref 150–400)
RBC: 3.97 MIL/uL (ref 3.87–5.11)
RDW: 14.9 % (ref 11.5–15.5)
WBC: 6.1 10*3/uL (ref 4.0–10.5)

## 2018-04-17 LAB — BASIC METABOLIC PANEL
ANION GAP: 6 (ref 5–15)
BUN: 22 mg/dL (ref 8–23)
CO2: 31 mmol/L (ref 22–32)
Calcium: 8.5 mg/dL — ABNORMAL LOW (ref 8.9–10.3)
Chloride: 103 mmol/L (ref 98–111)
Creatinine, Ser: 0.95 mg/dL (ref 0.44–1.00)
GFR calc Af Amer: >60 mL/min (ref >60)
GFR, EST NON AFRICAN AMERICAN: 52 mL/min — AB (ref >60)
GLUCOSE: 96 mg/dL (ref 70–99)
POTASSIUM: 4.4 mmol/L (ref 3.5–5.1)
Sodium: 140 mmol/L (ref 135–145)

## 2018-04-17 MED ORDER — AMLODIPINE BESYLATE 10 MG PO TABS
10.0000 mg | ORAL_TABLET | Freq: Every day | ORAL | Status: DC
  Administered 2018-04-18: 10 mg via ORAL
  Filled 2018-04-17: qty 1

## 2018-04-17 MED ORDER — ENOXAPARIN SODIUM 40 MG/0.4ML ~~LOC~~ SOLN
40.0000 mg | SUBCUTANEOUS | Status: DC
  Administered 2018-04-17: 40 mg via SUBCUTANEOUS
  Filled 2018-04-17: qty 0.4

## 2018-04-17 MED ORDER — IRBESARTAN 300 MG PO TABS
300.0000 mg | ORAL_TABLET | Freq: Every day | ORAL | Status: DC
  Administered 2018-04-18: 300 mg via ORAL
  Filled 2018-04-17: qty 1

## 2018-04-17 MED ORDER — ROSUVASTATIN CALCIUM 5 MG PO TABS
5.0000 mg | ORAL_TABLET | Freq: Every day | ORAL | Status: DC
  Administered 2018-04-17: 5 mg via ORAL
  Filled 2018-04-17: qty 1

## 2018-04-17 NOTE — Progress Notes (Signed)
PROGRESS NOTE    [name]  DJT:[id number] DOB: 1928-10-12 DOA: 04/16/2018 PCP: Carol Ada, MD   Brief Narrative: [name] is a 84 y.o. female history of hypertension, chronic kidney disease, recurrent SBO.  Patient presented secondary to abdominal pain was found to have a partial small bowel obstruction.  No NG tube placed.  Patient has improved and diet is being advanced as tolerated.   Assessment & Plan:   Active Problems:   SBO (small bowel obstruction) (H[facility])   HTN (hypertension)   Chronic kidney disease, unspecified   Hyperlipidemia   Partial small bowel obstruction Appears to be resolving.  Patient with good bowel sounds and a bowel movement today.  Tolerating diet so far. -Advance to soft diet.  If tolerates, advance to regular diet in the morning and likely discharge  Essential hypertension Uncontrolled.  Patient's antihypertensives were held in setting of partial small bowel obstruction. -Resume home irbesartan and amlodipine -Continue IV hydralazine PRN  Hyponatremia Resolved with IV fluids  CKD stage III Creatinine stable  Hyperlipidemia -Restart home Crestor  Right adnexal cyst Outpatient follow-up  Ectasia of the infrarenal abdominal aorta Unchanged. Outpatient follow-up   DVT prophylaxis: Lovenox Code Status:   Code Status: Full Code Family Communication: None at bedside Disposition Plan: Discharge home likely in 24 hours if tolerating a regular diet   Consultants:   None  Procedures:   None  Antimicrobials:  None    Subjective: Abdominal pain is improved.  No nausea or vomiting today.  Objective: Vitals:   04/16/18 1024 04/16/18 2040 04/17/18 0515 04/17/18 1500  BP: (!) 140/59 (!) 120/56 (!) 122/54 (!) 159/69  Pulse: 70 67 62 60  Resp: 17 15 15 18   Temp: 98.4 F (36.9 C) 98 F (36.7 C) 98 F (36.7 C) 97.7 F (36.5 C)  TempSrc: Oral Oral Oral Oral  SpO2: 97% 95% (!) 89% 98%  Weight:      Height:       No  intake or output data in the 24 hours ending 04/17/18 1943 Filed Weights   04/16/18 0022  Weight: 56.7 kg (125 lb)    Examination:  General exam: Appears calm and comfortable Respiratory system: Clear to auscultation. Respiratory effort normal. Cardiovascular system: S1 & S2 heard, RRR. No murmurs, rubs, gallops or clicks. Gastrointestinal system: Abdomen is distended, soft and nontender. Normal bowel sounds heard. Central nervous system: Alert and oriented. No focal neurological deficits. Extremities: No edema. No calf tenderness Skin: No cyanosis. No rashes Psychiatry: Judgement and insight appear normal. Mood & affect appropriate.     Data Reviewed: I have personally reviewed following labs and imaging studies  CBC: Recent Labs  Lab 04/16/18 0030 04/17/18 0407  WBC 18.5* 6.1  HGB 14.7 11.7*  HCT 44.9 37.1  MCV 90.2 93.5  PLT 194 300*   Basic Metabolic Panel: Recent Labs  Lab 04/16/18 0030 04/17/18 0407  NA 134* 140  K 4.1 4.4  CL 91* 103  CO2 30 31  GLUCOSE 173* 96  BUN 34* 22  CREATININE 1.15* 0.95  CALCIUM 10.3 8.5*   GFR: Estimated Creatinine Clearance: 34.5 mL/min (by C-G formula based on SCr of 0.95 mg/dL). Liver Function Tests: Recent Labs  Lab 04/16/18 0030  AST 21  ALT 22  ALKPHOS 82  BILITOT 1.3*  PROT 6.8  ALBUMIN 3.7   Recent Labs  Lab 04/16/18 0030  LIPASE 59*   No results for input(s): AMMONIA in the last 168 hours. Coagulation Profile: No  results for input(s): INR, PROTIME in the last 168 hours. Cardiac Enzymes: No results for input(s): CKTOTAL, CKMB, CKMBINDEX, TROPONINI in the last 168 hours. BNP (last 3 results) No results for input(s): PROBNP in the last 8760 hours. HbA1C: No results for input(s): HGBA1C in the last 72 hours. CBG: No results for input(s): GLUCAP in the last 168 hours. Lipid Profile: No results for input(s): CHOL, HDL, LDLCALC, TRIG, CHOLHDL, LDLDIRECT in the last 72 hours. Thyroid Function Tests: No  results for input(s): TSH, T4TOTAL, FREET4, T3FREE, THYROIDAB in the last 72 hours. Anemia Panel: No results for input(s): VITAMINB12, FOLATE, FERRITIN, TIBC, IRON, RETICCTPCT in the last 72 hours. Sepsis Labs: No results for input(s): PROCALCITON, LATICACIDVEN in the last 168 hours.  No results found for this or any previous visit (from the past 240 hour(s)).       Radiology Studies: Ct Abdomen Pelvis Wo Contrast  Result Date: 04/16/2018 CLINICAL DATA:  Abdominal pain and vomiting. History of small bowel obstruction. EXAM: CT ABDOMEN AND PELVIS WITHOUT CONTRAST TECHNIQUE: Multidetector CT imaging of the abdomen and pelvis was performed following the standard protocol without IV contrast. COMPARISON:  CT abdomen pelvis dated April 05, 2014. FINDINGS: Lower chest: Bibasilar atelectasis/scarring. Unchanged small pericardial effusion. Hepatobiliary: No focal liver abnormality. Cholelithiasis. No gallbladder wall thickening. Chronic mild common bile duct dilatation. Pancreas: Unremarkable. No pancreatic ductal dilatation or surrounding inflammatory changes. Spleen: Normal in size without focal abnormality. Adrenals/Urinary Tract: The adrenal glands are unremarkable. Unchanged bilateral renal cysts, including a 1.1 cm hyperdense lesion in the lower pole of the right kidney, previously characterized as a cyst on ultrasound. No renal or ureteral calculi. No hydronephrosis. The bladder is unremarkable. Stomach/Bowel: Distended stomach. Dilated loops of proximal small bowel with gradual transition to nondilated small bowel in the central mid abdomen. Mid to distal small bowel loops are not entirely decompressed. No wall thickening or pneumatosis. Colonic diverticulosis. Normal appendix. Vascular/Lymphatic: Aortic atherosclerosis. Unchanged ectasia of the infrarenal abdominal aorta, measuring 2.5 cm. No enlarged abdominal or pelvic lymph nodes. Reproductive: Status post hysterectomy. Stable left adnexal cyst  measuring 1.7 cm. Interval increase in size of right adnexal cysts, now measuring up to 4.0 cm, previously 3.0 cm. Other: Trace free fluid in the pelvis.  No pneumoperitoneum. Musculoskeletal: No acute or significant osseous findings. IMPRESSION: 1. Multiple dilated loops of proximal small bowel with gradual transition to non-dilated small bowel in the central mid abdomen. Mid to distal small bowel loops are not decompressed. Findings are favored to reflect partial small bowel obstruction. 2. Interval increase in size of right adnexal cyst, now measuring up to 4 cm. Recommend non-emergent pelvic ultrasound for further evaluation if not previously performed. 3. Cholelithiasis. 4. Unchanged ectasia of the infrarenal abdominal aorta, measuring 2.5 cm. Recommend followup by ultrasound in 5 years. This recommendation follows ACR consensus guidelines: White Paper of the ACR Incidental Findings Committee II on Vascular Findings. J Am Coll Radiol 2013; 10:789-794. 5.  Aortic atherosclerosis (ICD10-I70.0). Electronically Signed   By: Titus Dubin M.D.   On: 04/16/2018 08:23   Dg Chest 2 View  Result Date: 04/16/2018 CLINICAL DATA:  84 year old female with shortness of breath and cough. EXAM: CHEST - 2 VIEW COMPARISON:  Chest radiograph dated 04/08/2014 FINDINGS: No focal consolidation, pleural effusion, or pneumothorax. Mild cardiomegaly. Atherosclerotic calcification of the aortic arch. No acute osseous pathology. Osteopenia. IMPRESSION: No active cardiopulmonary disease. Electronically Signed   By: Anner Crete M.D.   On: 04/16/2018 03:56   Dg Abd 2  Views  Result Date: 04/17/2018 CLINICAL DATA:  Continued abdominal distension, follow-up small-bowel obstruction EXAM: ABDOMEN - 2 VIEW COMPARISON:  CT abdomen and pelvis 04/16/2018 FINDINGS: Lung bases emphysematous with scarring at LEFT base. No acute basilar infiltrates. Slight gaseous distention of stomach. Retained contrast and stool within colon. Few mildly  prominent air-filled loops of small bowel are seen in the mid abdomen though overall decreased in number and size since previous exam consistent with improving small bowel obstruction. No definite bowel wall thickening or free air. Bones severely demineralized with levoconvex lumbar scoliosis. IMPRESSION: Improving small bowel obstruction. Emphysematous changes with scarring at LEFT base. Electronically Signed   By: Lavonia Dana M.D.   On: 04/17/2018 10:02        Scheduled Meds: . bisacodyl  10 mg Rectal Once  . enoxaparin (LOVENOX) injection  40 mg Subcutaneous Q24H  . ipratropium  1 spray Each Nare TID  . sodium fluoride  1 application Oral Daily   Continuous Infusions: . sodium chloride 75 mL/hr at 04/16/18 1122     LOS: 1 day     Cordelia Poche, MD [facility] 04/17/2018, 7:43 PM Pager: [phone]  If 7PM-7AM, please contact night-coverage [email or web address] 04/17/2018, 7:43 PM

## 2018-04-18 DIAGNOSIS — K56609 Unspecified intestinal obstruction, unspecified as to partial versus complete obstruction: Secondary | ICD-10-CM

## 2018-04-18 NOTE — Discharge Summary (Signed)
Physician Discharge Summary  BRYNLEA SPINDLER XFG:[id number] DOB: [birth year] DOA: 04/16/2018  PCP: Carol Ada, MD  Admit date: 04/16/2018 Discharge date: 04/18/2018  Admitted From: Home Disposition:  Home  Recommendations for Outpatient Follow-up:  1. Follow up with PCP in 1 week 2. Interval increase in size of right adnexal cyst, now measuring up to 4 cm. Recommend non-emergent pelvic ultrasound for further evaluation if not previously performed. 3. Unchanged ectasia of the infrarenal abdominal aorta, measuring 2.5 cm. Recommend followup by ultrasound in 5 years.   Discharge Condition: Stable CODE STATUS: Full   Diet recommendation: Soft diet advance as tolerated to regular diet   Brief/Interim Summary: [name] is a 84 y.o. female with a medical history of hypertension, chronic kidney disease, small bowel obstruction in the past, who presented to the emergency department with complaints of abdominal distention and pain which is been ongoing for only 1 day.  Patient states she did have some green-colored vomit and felt better after she vomited.  She did have a bowel movement this morning prior to admission.  Patient currently denies chest pain, shortness of breath, diarrhea or constipation, pain with urination or increased frequency, headache or dizziness, recent travel or illness.  Patient does admit to having a similar episode back in 2015 which required surgery. CT A/P revealed multiple dilated loops of proximal small bowel with gradual transition to nondilated small bowel in the central mid abdomen.  Findings reflect partial small bowel obstruction.  She was treated with conservative measures.  Repeat abdominal x-ray revealed improving bowel obstruction.  Her diet was slowly advanced.  On day of discharge, she was tolerating soft diet without any nausea or vomiting.  Her abdomen remains soft and nontender to palpation.  She is having bowel movements and flatus.  She was discharged  home in stable condition.  Discharge Diagnoses:  Principal Problem:   SBO (small bowel obstruction) (HCC) Active Problems:   HTN (hypertension)   Chronic kidney disease, unspecified   Hyperlipidemia Leukocytosis  Right adenexal cyst Ectasia infrarenal abdominal aorta   Discharge Instructions  Discharge Instructions    Call MD for:  difficulty breathing, headache or visual disturbances   Complete by:  As directed    Call MD for:  extreme fatigue   Complete by:  As directed    Call MD for:  hives   Complete by:  As directed    Call MD for:  persistant dizziness or light-headedness   Complete by:  As directed    Call MD for:  persistant nausea and vomiting   Complete by:  As directed    Call MD for:  severe uncontrolled pain   Complete by:  As directed    Call MD for:  temperature >100.4   Complete by:  As directed    Diet - low sodium heart healthy   Complete by:  As directed    Discharge instructions   Complete by:  As directed    You were cared for by a hospitalist during your hospital stay. If you have any questions about your discharge medications or the care you received while you were in the hospital after you are discharged, you can call the unit and ask to speak with the hospitalist on call if the hospitalist that took care of you is not available. Once you are discharged, your primary care physician will handle any further medical issues. Please note that NO REFILLS for any discharge medications will be authorized once you are discharged,  as it is imperative that you return to your primary care physician (or establish a relationship with a primary care physician if you do not have one) for your aftercare needs so that they can reassess your need for medications and monitor your lab values.   Increase activity slowly   Complete by:  As directed      Allergies as of 04/18/2018      Reactions   Diovan [valsartan] Other (See Comments)   gas   Hctz [hydrochlorothiazide]     gas   Tekamlo [aliskiren-amlodipine]    gas   Valturna [aliskiren-valsartan]    Gas      Medication List    TAKE these medications   acetaminophen 325 MG tablet Commonly known as:  TYLENOL Take 2 tablets (650 mg total) by mouth every 4 (four) hours as needed. What changed:  reasons to take this   amLODipine 10 MG tablet Commonly known as:  NORVASC Take 10 mg by mouth daily.   aspirin EC 81 MG tablet Take 81 mg by mouth at bedtime.   calcium carbonate 500 MG chewable tablet Commonly known as:  TUMS - dosed in mg elemental calcium Chew 1-2 tablets (200-400 mg of elemental calcium total) by mouth 3 (three) times daily as needed. What changed:  reasons to take this   cholecalciferol 1000 units tablet Commonly known as:  VITAMIN D Take 1,000 Units by mouth at bedtime.   DENTAGEL 1.1 % Gel dental gel Generic drug:  sodium fluoride Take 1 application by mouth daily. Place on toothbrush and brush daily for 2 minutes to prevent tooth decay/sensitivity   ferrous sulfate 325 (65 FE) MG tablet Take 325 mg by mouth daily with breakfast. Unknown dose   ipratropium 0.06 % nasal spray Commonly known as:  ATROVENT Place 1 spray into both nostrils 3 (three) times daily.   irbesartan 300 MG tablet Commonly known as:  AVAPRO Take 300 mg by mouth daily.   lactobacillus acidophilus Tabs tablet Take 1 tablet by mouth at bedtime. Uses [employer] brand   loratadine 10 MG tablet Commonly known as:  CLARITIN Take 10 mg by mouth daily as needed for allergies.   multivitamin with minerals Tabs tablet Take 1 tablet by mouth daily.   psyllium 58.6 % packet Commonly known as:  METAMUCIL Take 1 packet by mouth at bedtime.   rosuvastatin 5 MG tablet Commonly known as:  CRESTOR Take 5 mg by mouth at bedtime.      Follow-up Information    Carol Ada, MD. Schedule an appointment as soon as possible for a visit in 1 week(s).   Specialty:  Family Medicine Contact information: [place] [address] [phone]          Allergies  Allergen Reactions  . Diovan [Valsartan] Other (See Comments)    gas  . Hctz [Hydrochlorothiazide]     gas  . Tekamlo [Aliskiren-Amlodipine]     gas  . Valturna [Aliskiren-Valsartan]     Gas     Consultations:  None    Procedures/Studies: Ct Abdomen Pelvis Wo Contrast  Result Date: 04/16/2018 CLINICAL DATA:  Abdominal pain and vomiting. History of small bowel obstruction. EXAM: CT ABDOMEN AND PELVIS WITHOUT CONTRAST TECHNIQUE: Multidetector CT imaging of the abdomen and pelvis was performed following the standard protocol without IV contrast. COMPARISON:  CT abdomen pelvis dated April 05, 2014. FINDINGS: Lower chest: Bibasilar atelectasis/scarring. Unchanged small pericardial effusion. Hepatobiliary: No focal liver abnormality. Cholelithiasis. No gallbladder  wall thickening. Chronic mild common bile duct dilatation. Pancreas: Unremarkable. No pancreatic ductal dilatation or surrounding inflammatory changes. Spleen: Normal in size without focal abnormality. Adrenals/Urinary Tract: The adrenal glands are unremarkable. Unchanged bilateral renal cysts, including a 1.1 cm hyperdense lesion in the lower pole of the right kidney, previously characterized as a cyst on ultrasound. No renal or ureteral calculi. No hydronephrosis. The bladder is unremarkable. Stomach/Bowel: Distended stomach. Dilated loops of proximal small bowel with gradual transition to nondilated small bowel in the central mid abdomen. Mid to distal small bowel loops are not entirely decompressed. No wall thickening or pneumatosis. Colonic diverticulosis. Normal appendix. Vascular/Lymphatic: Aortic atherosclerosis. Unchanged ectasia of the infrarenal abdominal aorta, measuring 2.5 cm. No enlarged abdominal or pelvic lymph nodes. Reproductive: Status post hysterectomy. Stable left adnexal cyst measuring 1.7 cm. Interval increase in size of right  adnexal cysts, now measuring up to 4.0 cm, previously 3.0 cm. Other: Trace free fluid in the pelvis.  No pneumoperitoneum. Musculoskeletal: No acute or significant osseous findings. IMPRESSION: 1. Multiple dilated loops of proximal small bowel with gradual transition to non-dilated small bowel in the central mid abdomen. Mid to distal small bowel loops are not decompressed. Findings are favored to reflect partial small bowel obstruction. 2. Interval increase in size of right adnexal cyst, now measuring up to 4 cm. Recommend non-emergent pelvic ultrasound for further evaluation if not previously performed. 3. Cholelithiasis. 4. Unchanged ectasia of the infrarenal abdominal aorta, measuring 2.5 cm. Recommend followup by ultrasound in 5 years. This recommendation follows ACR consensus guidelines: White Paper of the ACR Incidental Findings Committee II on Vascular Findings. J Am Coll Radiol 2013; 10:789-794. 5.  Aortic atherosclerosis (ICD10-I70.0). Electronically Signed   By: Titus Dubin M.D.   On: 04/16/2018 08:23   Dg Chest 2 View  Result Date: 04/16/2018 CLINICAL DATA:  84 year old female with shortness of breath and cough. EXAM: CHEST - 2 VIEW COMPARISON:  Chest radiograph dated 04/08/2014 FINDINGS: No focal consolidation, pleural effusion, or pneumothorax. Mild cardiomegaly. Atherosclerotic calcification of the aortic arch. No acute osseous pathology. Osteopenia. IMPRESSION: No active cardiopulmonary disease. Electronically Signed   By: Anner Crete M.D.   On: 04/16/2018 03:56   Dg Abd 2 Views  Result Date: 04/17/2018 CLINICAL DATA:  Continued abdominal distension, follow-up small-bowel obstruction EXAM: ABDOMEN - 2 VIEW COMPARISON:  CT abdomen and pelvis 04/16/2018 FINDINGS: Lung bases emphysematous with scarring at LEFT base. No acute basilar infiltrates. Slight gaseous distention of stomach. Retained contrast and stool within colon. Few mildly prominent air-filled loops of small bowel are seen  in the mid abdomen though overall decreased in number and size since previous exam consistent with improving small bowel obstruction. No definite bowel wall thickening or free air. Bones severely demineralized with levoconvex lumbar scoliosis. IMPRESSION: Improving small bowel obstruction. Emphysematous changes with scarring at LEFT base. Electronically Signed   By: Lavonia Dana M.D.   On: 04/17/2018 10:02      Discharge Exam: Vitals:   04/17/18 2220 04/18/18 0528  BP: (!) 153/68 139/76  Pulse: (!) 59 60  Resp: 16 16  Temp: 98.5 F (36.9 C) 97.7 F (36.5 C)  SpO2: 94% 95%    General: Pt is alert, awake, not in acute distress Cardiovascular: RRR, S1/S2 +, no rubs, no gallops Respiratory: CTA bilaterally, no wheezing, no rhonchi Abdominal: Soft, NT, ND, bowel sounds + Extremities: no edema, no cyanosis    The results of significant diagnostics from this hospitalization (including imaging, microbiology, ancillary and laboratory) are  listed below for reference.     Microbiology: No results found for this or any previous visit (from the past 240 hour(s)).   Labs: BNP (last 3 results) No results for input(s): BNP in the last 8760 hours. Basic Metabolic Panel: Recent Labs  Lab 04/16/18 0030 04/17/18 0407  NA 134* 140  K 4.1 4.4  CL 91* 103  CO2 30 31  GLUCOSE 173* 96  BUN 34* 22  CREATININE 1.15* 0.95  CALCIUM 10.3 8.5*   Liver Function Tests: Recent Labs  Lab 04/16/18 0030  AST 21  ALT 22  ALKPHOS 82  BILITOT 1.3*  PROT 6.8  ALBUMIN 3.7   Recent Labs  Lab 04/16/18 0030  LIPASE 59*   No results for input(s): AMMONIA in the last 168 hours. CBC: Recent Labs  Lab 04/16/18 0030 04/17/18 0407  WBC 18.5* 6.1  HGB 14.7 11.7*  HCT 44.9 37.1  MCV 90.2 93.5  PLT 194 146*   Cardiac Enzymes: No results for input(s): CKTOTAL, CKMB, CKMBINDEX, TROPONINI in the last 168 hours. BNP: Invalid input(s): POCBNP CBG: No results for input(s): GLUCAP in the last 168  hours. D-Dimer No results for input(s): DDIMER in the last 72 hours. Hgb A1c No results for input(s): HGBA1C in the last 72 hours. Lipid Profile No results for input(s): CHOL, HDL, LDLCALC, TRIG, CHOLHDL, LDLDIRECT in the last 72 hours. Thyroid function studies No results for input(s): TSH, T4TOTAL, T3FREE, THYROIDAB in the last 72 hours.  Invalid input(s): FREET3 Anemia work up No results for input(s): VITAMINB12, FOLATE, FERRITIN, TIBC, IRON, RETICCTPCT in the last 72 hours. Urinalysis    Component Value Date/Time   COLORURINE YELLOW 04/06/2014 0715   APPEARANCEUR CLEAR 04/06/2014 0715   LABSPEC 1.019 04/06/2014 0715   PHURINE 7.5 04/06/2014 0715   GLUCOSEU NEGATIVE 04/06/2014 0715   HGBUR NEGATIVE 04/06/2014 0715   BILIRUBINUR NEGATIVE 04/06/2014 0715   KETONESUR NEGATIVE 04/06/2014 0715   PROTEINUR 30 (A) 04/06/2014 0715   UROBILINOGEN 0.2 04/06/2014 0715   NITRITE NEGATIVE 04/06/2014 0715   LEUKOCYTESUR SMALL (A) 04/06/2014 0715   Sepsis Labs Invalid input(s): PROCALCITONIN,  WBC,  LACTICIDVEN Microbiology No results found for this or any previous visit (from the past 240 hour(s)).   Patient was seen and examined on the day of discharge and was found to be in stable condition. Time coordinating discharge: 25 minutes including assessment and coordination of care, as well as examination of the patient.   SIGNED:  Dessa Phi, DO [facility] Pager [phone]  If 7PM-7AM, please contact night-coverage [email or web address] Password [facility] 04/18/2018, 12:08 PM

## 2018-04-18 NOTE — Progress Notes (Signed)
Discharge instructions reviewed with patient. Patient verbalized understanding. Patient discharged via private vehicle. 

## 2018-04-18 NOTE — Progress Notes (Signed)
MD notified of loss of iv access, fluid orders d/c'd.  04/17/2018  Nedra Hai, RN

## 2018-06-03 DIAGNOSIS — K56609 Unspecified intestinal obstruction, unspecified as to partial versus complete obstruction: Secondary | ICD-10-CM

## 2018-06-03 HISTORY — DX: Unspecified intestinal obstruction, unspecified as to partial versus complete obstruction: K56.609

## 2018-06-17 DIAGNOSIS — I10 Essential (primary) hypertension: Secondary | ICD-10-CM | POA: Diagnosis not present

## 2018-06-17 DIAGNOSIS — R0902 Hypoxemia: Secondary | ICD-10-CM | POA: Diagnosis not present

## 2018-06-17 DIAGNOSIS — I959 Hypotension, unspecified: Secondary | ICD-10-CM | POA: Diagnosis not present

## 2018-06-17 DIAGNOSIS — R1084 Generalized abdominal pain: Secondary | ICD-10-CM | POA: Diagnosis not present

## 2018-06-17 DIAGNOSIS — R52 Pain, unspecified: Secondary | ICD-10-CM | POA: Diagnosis not present

## 2018-06-18 ENCOUNTER — Emergency Department (HOSPITAL_COMMUNITY): Payer: PPO

## 2018-06-18 ENCOUNTER — Inpatient Hospital Stay (HOSPITAL_COMMUNITY)
Admission: EM | Admit: 2018-06-18 | Discharge: 2018-06-23 | DRG: 389 | Disposition: A | Discharge status: Home / Self Care | Payer: PPO | Attending: Internal Medicine | Admitting: Internal Medicine

## 2018-06-18 ENCOUNTER — Inpatient Hospital Stay (HOSPITAL_COMMUNITY): Payer: PPO

## 2018-06-18 ENCOUNTER — Encounter (HOSPITAL_COMMUNITY): Payer: Self-pay | Admitting: Emergency Medicine

## 2018-06-18 ENCOUNTER — Other Ambulatory Visit: Payer: Self-pay

## 2018-06-18 DIAGNOSIS — N179 Acute kidney failure, unspecified: Secondary | ICD-10-CM | POA: Diagnosis present

## 2018-06-18 DIAGNOSIS — E876 Hypokalemia: Secondary | ICD-10-CM | POA: Diagnosis not present

## 2018-06-18 DIAGNOSIS — D6851 Activated protein C resistance: Secondary | ICD-10-CM | POA: Diagnosis present

## 2018-06-18 DIAGNOSIS — I10 Essential (primary) hypertension: Secondary | ICD-10-CM | POA: Diagnosis not present

## 2018-06-18 DIAGNOSIS — Z9049 Acquired absence of other specified parts of digestive tract: Secondary | ICD-10-CM

## 2018-06-18 DIAGNOSIS — E875 Hyperkalemia: Secondary | ICD-10-CM | POA: Diagnosis not present

## 2018-06-18 DIAGNOSIS — M810 Age-related osteoporosis without current pathological fracture: Secondary | ICD-10-CM | POA: Diagnosis not present

## 2018-06-18 DIAGNOSIS — E785 Hyperlipidemia, unspecified: Secondary | ICD-10-CM | POA: Diagnosis not present

## 2018-06-18 DIAGNOSIS — Z4682 Encounter for fitting and adjustment of non-vascular catheter: Secondary | ICD-10-CM | POA: Diagnosis not present

## 2018-06-18 DIAGNOSIS — K56609 Unspecified intestinal obstruction, unspecified as to partial versus complete obstruction: Principal | ICD-10-CM | POA: Diagnosis present

## 2018-06-18 DIAGNOSIS — Z79899 Other long term (current) drug therapy: Secondary | ICD-10-CM

## 2018-06-18 DIAGNOSIS — E861 Hypovolemia: Secondary | ICD-10-CM | POA: Diagnosis not present

## 2018-06-18 DIAGNOSIS — K8020 Calculus of gallbladder without cholecystitis without obstruction: Secondary | ICD-10-CM | POA: Diagnosis not present

## 2018-06-18 DIAGNOSIS — Z87891 Personal history of nicotine dependence: Secondary | ICD-10-CM | POA: Diagnosis not present

## 2018-06-18 DIAGNOSIS — N182 Chronic kidney disease, stage 2 (mild): Secondary | ICD-10-CM | POA: Diagnosis present

## 2018-06-18 DIAGNOSIS — Z86718 Personal history of other venous thrombosis and embolism: Secondary | ICD-10-CM | POA: Diagnosis not present

## 2018-06-18 DIAGNOSIS — R079 Chest pain, unspecified: Secondary | ICD-10-CM | POA: Diagnosis present

## 2018-06-18 DIAGNOSIS — Z7982 Long term (current) use of aspirin: Secondary | ICD-10-CM

## 2018-06-18 DIAGNOSIS — Z888 Allergy status to other drugs, medicaments and biological substances status: Secondary | ICD-10-CM | POA: Diagnosis not present

## 2018-06-18 DIAGNOSIS — R109 Unspecified abdominal pain: Secondary | ICD-10-CM | POA: Diagnosis not present

## 2018-06-18 DIAGNOSIS — K5669 Other partial intestinal obstruction: Secondary | ICD-10-CM | POA: Diagnosis not present

## 2018-06-18 DIAGNOSIS — I129 Hypertensive chronic kidney disease with stage 1 through stage 4 chronic kidney disease, or unspecified chronic kidney disease: Secondary | ICD-10-CM | POA: Diagnosis present

## 2018-06-18 DIAGNOSIS — R110 Nausea: Secondary | ICD-10-CM | POA: Diagnosis not present

## 2018-06-18 DIAGNOSIS — N189 Chronic kidney disease, unspecified: Secondary | ICD-10-CM | POA: Diagnosis present

## 2018-06-18 DIAGNOSIS — Z0189 Encounter for other specified special examinations: Secondary | ICD-10-CM

## 2018-06-18 HISTORY — DX: Unspecified intestinal obstruction, unspecified as to partial versus complete obstruction: K56.609

## 2018-06-18 LAB — BASIC METABOLIC PANEL
ANION GAP: 12 (ref 5–15)
Anion gap: 10 (ref 5–15)
BUN: 37 mg/dL — AB (ref 8–23)
BUN: 34 mg/dL — AB (ref 8–23)
CALCIUM: 10.1 mg/dL (ref 8.9–10.3)
CHLORIDE: 98 mmol/L (ref 98–111)
CHLORIDE: 97 mmol/L — AB (ref 98–111)
CO2: 28 mmol/L (ref 22–32)
CO2: 31 mmol/L (ref 22–32)
CREATININE: 1.17 mg/dL — AB (ref 0.44–1.00)
Calcium: 10.1 mg/dL (ref 8.9–10.3)
Creatinine, Ser: 1.24 mg/dL — ABNORMAL HIGH (ref 0.44–1.00)
GFR calc Af Amer: 47 mL/min — ABNORMAL LOW (ref >60)
GFR calc non Af Amer: 41 mL/min — ABNORMAL LOW (ref >60)
GFR, EST AFRICAN AMERICAN: 44 mL/min — AB (ref >60)
GFR, EST NON AFRICAN AMERICAN: 38 mL/min — AB (ref >60)
Glucose, Bld: 151 mg/dL — ABNORMAL HIGH (ref 70–99)
Glucose, Bld: 169 mg/dL — ABNORMAL HIGH (ref 70–99)
Potassium: 4.8 mmol/L (ref 3.5–5.1)
Potassium: 5.3 mmol/L — ABNORMAL HIGH (ref 3.5–5.1)
SODIUM: 138 mmol/L (ref 135–145)
Sodium: 138 mmol/L (ref 135–145)

## 2018-06-18 LAB — CBC
HCT: 42.4 % (ref 36.0–46.0)
HCT: 43.1 % (ref 36.0–46.0)
Hemoglobin: 13.4 g/dL (ref 12.0–15.0)
Hemoglobin: 13.7 g/dL (ref 12.0–15.0)
MCH: 29.4 pg (ref 26.0–34.0)
MCH: 29 pg (ref 26.0–34.0)
MCHC: 31.6 g/dL (ref 30.0–36.0)
MCHC: 31.8 g/dL (ref 30.0–36.0)
MCV: 93 fL (ref 78.0–100.0)
MCV: 91.3 fL (ref 78.0–100.0)
PLATELETS: 120 10*3/uL — AB (ref 150–400)
PLATELETS: 125 10*3/uL — AB (ref 150–400)
RBC: 4.56 MIL/uL (ref 3.87–5.11)
RBC: 4.72 MIL/uL (ref 3.87–5.11)
RDW: 14.5 % (ref 11.5–15.5)
RDW: 14.3 % (ref 11.5–15.5)
WBC: 10.1 10*3/uL (ref 4.0–10.5)
WBC: 9.8 10*3/uL (ref 4.0–10.5)

## 2018-06-18 LAB — HEPATIC FUNCTION PANEL
ALBUMIN: 3.3 g/dL — AB (ref 3.5–5.0)
ALK PHOS: 83 U/L (ref 38–126)
ALT: 31 U/L (ref 0–44)
AST: 27 U/L (ref 15–41)
BILIRUBIN DIRECT: 0.1 mg/dL (ref 0.0–0.2)
BILIRUBIN TOTAL: 0.8 mg/dL (ref 0.3–1.2)
Indirect Bilirubin: 0.7 mg/dL (ref 0.3–0.9)
Total Protein: 6.4 g/dL — ABNORMAL LOW (ref 6.5–8.1)

## 2018-06-18 LAB — URINALYSIS, ROUTINE W REFLEX MICROSCOPIC
Bilirubin Urine: NEGATIVE
GLUCOSE, UA: NEGATIVE mg/dL
HGB URINE DIPSTICK: NEGATIVE
Ketones, ur: NEGATIVE mg/dL
LEUKOCYTES UA: NEGATIVE
Nitrite: NEGATIVE
PH: 5 (ref 5.0–8.0)
PROTEIN: NEGATIVE mg/dL
SPECIFIC GRAVITY, URINE: 1.044 — AB (ref 1.005–1.030)

## 2018-06-18 LAB — I-STAT CG4 LACTIC ACID, ED: Lactic Acid, Venous: 0.48 mmol/L — ABNORMAL LOW (ref 0.5–1.9)

## 2018-06-18 LAB — TROPONIN I: Troponin I: <0.03 ng/mL (ref <0.03)

## 2018-06-18 LAB — I-STAT TROPONIN, ED: TROPONIN I, POC: 0.01 ng/mL (ref 0.00–0.08)

## 2018-06-18 LAB — LIPASE, BLOOD: Lipase: 60 U/L — ABNORMAL HIGH (ref 11–51)

## 2018-06-18 MED ORDER — ONDANSETRON HCL 4 MG PO TABS: 4.0000 mg | ORAL_TABLET | Freq: Four times a day (QID) | ORAL | Status: DC | PRN

## 2018-06-18 MED ORDER — DIATRIZOATE MEGLUMINE & SODIUM 66-10 % PO SOLN
90.0000 mL | Freq: Once | ORAL | Status: AC
  Administered 2018-06-18: 90 mL via NASOGASTRIC
  Filled 2018-06-18: qty 90

## 2018-06-18 MED ORDER — IOHEXOL 300 MG/ML  SOLN
80.0000 mL | Freq: Once | INTRAMUSCULAR | Status: AC | PRN
  Administered 2018-06-18: 80 mL via INTRAVENOUS

## 2018-06-18 MED ORDER — FENTANYL CITRATE (PF) 100 MCG/2ML IJ SOLN
50.0000 ug | Freq: Once | INTRAMUSCULAR | Status: AC
  Administered 2018-06-18: 50 ug via INTRAVENOUS
  Filled 2018-06-18: qty 2

## 2018-06-18 MED ORDER — ACETAMINOPHEN 650 MG RE SUPP: 650.0000 mg | Freq: Four times a day (QID) | RECTAL | Status: DC | PRN

## 2018-06-18 MED ORDER — HYDRALAZINE HCL 20 MG/ML IJ SOLN
10.0000 mg | INTRAMUSCULAR | Status: DC | PRN
  Administered 2018-06-18: 10 mg via INTRAVENOUS
  Filled 2018-06-18: qty 1

## 2018-06-18 MED ORDER — SODIUM CHLORIDE 0.9 % IV BOLUS: 500.0000 mL | Freq: Once | INTRAVENOUS | Status: DC

## 2018-06-18 MED ORDER — ONDANSETRON HCL 4 MG/2ML IJ SOLN
4.0000 mg | Freq: Once | INTRAMUSCULAR | Status: AC
  Administered 2018-06-18: 4 mg via INTRAVENOUS
  Filled 2018-06-18: qty 2

## 2018-06-18 MED ORDER — SODIUM CHLORIDE 0.9 % IV SOLN
INTRAVENOUS | Status: AC
  Administered 2018-06-18 (×2): via INTRAVENOUS

## 2018-06-18 MED ORDER — ONDANSETRON HCL 4 MG/2ML IJ SOLN
4.0000 mg | Freq: Four times a day (QID) | INTRAMUSCULAR | Status: DC | PRN
  Administered 2018-06-18 – 2018-06-21 (×2): 4 mg via INTRAVENOUS
  Filled 2018-06-18 (×2): qty 2

## 2018-06-18 MED ORDER — SODIUM CHLORIDE 0.9 % IV SOLN
INTRAVENOUS | Status: DC
  Administered 2018-06-18: 03:00:00 via INTRAVENOUS

## 2018-06-18 MED ORDER — ACETAMINOPHEN 325 MG PO TABS: 650.0000 mg | ORAL_TABLET | Freq: Four times a day (QID) | ORAL | Status: DC | PRN

## 2018-06-18 MED ORDER — FENTANYL CITRATE (PF) 100 MCG/2ML IJ SOLN
25.0000 ug | INTRAMUSCULAR | Status: DC | PRN
  Administered 2018-06-18 (×2): 25 ug via INTRAVENOUS
  Filled 2018-06-18 (×2): qty 2

## 2018-06-18 MED ORDER — SODIUM POLYSTYRENE SULFONATE 15 GM/60ML PO SUSP
30.0000 g | Freq: Once | ORAL | Status: AC
  Administered 2018-06-18: 30 g via RECTAL
  Filled 2018-06-18: qty 120

## 2018-06-18 NOTE — H&P (Signed)
History and Physical    [name] TIR:[id number] DOB: [birth year] DOA: 06/18/2018  PCP: Carol Ada, MD  Patient coming from: Home.  Chief Complaint: Normal pain.  HPI: [name] is a 84 y.o. female with history of hypertension, hyperlipidemia chronic kidney disease who has had recurrent bowel obstructions and has had previous surgery for bowel obstruction in 2014 was admitted for similar complaints and July 2019 2 months ago and at that time was managed conservatively resents to the ER with sudden onset of abdominal pain after having dinner.  Patient's pain started around 7 PM and has had dinner around 6 PM.  When the pain started patient did have a bowel movement.  Did not have any vomiting did have some nausea.  Pain is mostly in the central abdomen diffuse now.  ED Course: In the ER CT abdomen pelvis shows high-grade bowel obstruction with transition point on-call general surgeon has been consulted.  Patient has been placed on NG tube and admitted for small bowel obstruction.  Review of Systems: As per HPI, rest all negative.   Past Medical History:  Diagnosis Date  . Chronic kidney disease   . DVT (deep venous thrombosis) ([place])   . Dysphonia   . Factor V Leiden ([place])   . Hypertension   . Osteoporosis   . PNA (pneumonia) 12/31/2012  . Pneumonitis 03/19/2013  . Small bowel obstruction [facility])     Past Surgical History:  Procedure Laterality Date  . ABDOMINAL HYSTERECTOMY    . APPENDECTOMY    . LAPAROTOMY N/A 01/01/2013   Procedure: EXPLORATORY LAPAROTOMY removal of smal bowel foreign body;  Surgeon: Madilyn Hook, DO;  Location: [facility];  Service: General;  Laterality: N/A;  . TONSILLECTOMY       reports that she quit smoking about 19 years ago. She has never used smokeless tobacco. She reports that she drinks alcohol. She reports that she does not use drugs.  Allergies  Allergen Reactions  . Diovan [Valsartan] Other (See Comments)    gas  . Hctz  [Hydrochlorothiazide]     gas  . Tekamlo [Aliskiren-Amlodipine]     gas  . Valturna [Aliskiren-Valsartan]     Gas     Family History  Family history unknown: Yes    Prior to Admission medications   Medication Sig Start Date End Date Taking? Authorizing Provider  acetaminophen (TYLENOL) 325 MG tablet Take 2 tablets (650 mg total) by mouth every 4 (four) hours as needed. Patient taking differently: Take 650 mg by mouth every 4 (four) hours as needed for mild pain.  01/21/13   Whiteheart, Cristal Ford, NP  amLODipine (NORVASC) 10 MG tablet Take 10 mg by mouth daily.    [provider]  aspirin EC 81 MG tablet Take 81 mg by mouth at bedtime.     [provider]  calcium carbonate (TUMS - DOSED IN MG ELEMENTAL CALCIUM) 500 MG chewable tablet Chew 1-2 tablets (200-400 mg of elemental calcium total) by mouth 3 (three) times daily as needed. Patient taking differently: Chew 1-2 tablets by mouth 3 (three) times daily as needed for indigestion.  01/21/13   Whiteheart, Cristal Ford, NP  cholecalciferol (VITAMIN D) 1000 units tablet Take 1,000 Units by mouth at bedtime.    [provider]  DENTAGEL 1.1 % GEL dental gel Take 1 application by mouth daily. Place on toothbrush and brush daily for 2 minutes to prevent tooth decay/sensitivity 01/16/18   [provider]  ferrous sulfate 325 (  65 FE) MG tablet Take 325 mg by mouth daily with breakfast. Unknown dose    [provider]  ipratropium (ATROVENT) 0.06 % nasal spray Place 1 spray into both nostrils 3 (three) times daily. 04/13/18   [provider]  irbesartan (AVAPRO) 300 MG tablet Take 300 mg by mouth daily.    [provider]  lactobacillus acidophilus (BACID) TABS tablet Take 1 tablet by mouth at bedtime. Uses [employer] brand     [provider]  loratadine (CLARITIN) 10 MG tablet Take 10 mg by mouth daily as needed for allergies.     [provider]  Multiple Vitamin  (MULTIVITAMIN WITH MINERALS) TABS Take 1 tablet by mouth daily.    [provider]  psyllium (METAMUCIL) 58.6 % packet Take 1 packet by mouth at bedtime.     [provider]  rosuvastatin (CRESTOR) 5 MG tablet Take 5 mg by mouth at bedtime.     [provider]    Physical Exam: Vitals:   06/18/18 0145 06/18/18 0201 06/18/18 0204 06/18/18 0357  BP: (!) 157/59 (!) 170/70 (!) 153/65 (!) 145/57  Pulse: (!) 56   64  Resp: 13   18  Temp:      TempSrc:      SpO2: 90%   95%  Weight:      Height:          Constitutional: Moderately built and nourished. Vitals:   06/18/18 0145 06/18/18 0201 06/18/18 0204 06/18/18 0357  BP: (!) 157/59 (!) 170/70 (!) 153/65 (!) 145/57  Pulse: (!) 56   64  Resp: 13   18  Temp:      TempSrc:      SpO2: 90%   95%  Weight:      Height:       Eyes: Anicteric no pallor. ENMT: No discharge from the ears eyes nose or mouth. Neck: No mass felt.  No neck rigidity. Respiratory: No rhonchi or crepitations. Cardiovascular: S1-S2 heard no murmurs appreciated. Abdomen: Distended bowel sounds not appreciated.  No guarding or rigidity. Musculoskeletal: No edema.  No joint effusion. Skin: No rash. Neurologic: Alert awake oriented to time place and person.  Moves all extremities. Psychiatric: Appears normal per normal affect.   Labs on Admission: I have personally reviewed following labs and imaging studies  CBC: Recent Labs  Lab 06/18/18 0108  WBC 10.1  HGB 13.4  HCT 42.4  MCV 93.0  PLT 423*   Basic Metabolic Panel: Recent Labs  Lab 06/18/18 0108  NA 138  K 4.8  CL 98  CO2 28  GLUCOSE 151*  BUN 37*  CREATININE 1.24*  CALCIUM 10.1   GFR: Estimated Creatinine Clearance: 26.4 mL/min (A) (by C-G formula based on SCr of 1.24 mg/dL (H)). Liver Function Tests: No results for input(s): AST, ALT, ALKPHOS, BILITOT, PROT, ALBUMIN in the last 168 hours. Recent Labs  Lab 06/18/18 0108  LIPASE 60*   No results for  input(s): AMMONIA in the last 168 hours. Coagulation Profile: No results for input(s): INR, PROTIME in the last 168 hours. Cardiac Enzymes: Recent Labs  Lab 06/18/18 0108  TROPONINI <0.03   BNP (last 3 results) No results for input(s): PROBNP in the last 8760 hours. HbA1C: No results for input(s): HGBA1C in the last 72 hours. CBG: No results for input(s): GLUCAP in the last 168 hours. Lipid Profile: No results for input(s): CHOL, HDL, LDLCALC, TRIG, CHOLHDL, LDLDIRECT in the last 72 hours. Thyroid Function Tests: No results  for input(s): TSH, T4TOTAL, FREET4, T3FREE, THYROIDAB in the last 72 hours. Anemia Panel: No results for input(s): VITAMINB12, FOLATE, FERRITIN, TIBC, IRON, RETICCTPCT in the last 72 hours. Urine analysis:    Component Value Date/Time   COLORURINE YELLOW 04/06/2014 0715   APPEARANCEUR CLEAR 04/06/2014 0715   LABSPEC 1.019 04/06/2014 0715   PHURINE 7.5 04/06/2014 0715   GLUCOSEU NEGATIVE 04/06/2014 0715   HGBUR NEGATIVE 04/06/2014 0715   BILIRUBINUR NEGATIVE 04/06/2014 0715   KETONESUR NEGATIVE 04/06/2014 0715   PROTEINUR 30 (A) 04/06/2014 0715   UROBILINOGEN 0.2 04/06/2014 0715   NITRITE NEGATIVE 04/06/2014 0715   LEUKOCYTESUR SMALL (A) 04/06/2014 0715   Sepsis Labs: @LABRCNTIP (procalcitonin:4,lacticidven:4) )No results found for this or any previous visit (from the past 240 hour(s)).   Radiological Exams on Admission: Ct Abdomen Pelvis W Contrast  Result Date: 06/18/2018 CLINICAL DATA:  Bowel obstruction. History of hysterectomy, appendectomy, exploratory laparotomy for small bowel foreign body removal. EXAM: CT ABDOMEN AND PELVIS WITH CONTRAST TECHNIQUE: Multidetector CT imaging of the abdomen and pelvis was performed using the standard protocol following bolus administration of intravenous contrast. CONTRAST:  57mL OMNIPAQUE IOHEXOL 300 MG/ML  SOLN COMPARISON:  Abdominal radiograph June 18, 2018 and CT abdomen and pelvis April 16, 2018 in CT  abdomen and pelvis April 05, 2014 FINDINGS: LOWER CHEST: Stable 5 mm RIGHT lower lobe subsolid pulmonary nodule. No routine indicated follow-up. This recommendation follows the consensus statement: Guidelines for Management of Small Pulmonary Nodules Detected on CT Images: From the Fleischner Society 2017; Radiology 2017; 284:228-243. nodular scarring lingula. Bibasilar atelectasis/scarring. Code heart size is normal. Small pericardial effusion. HEPATOBILIARY: Calcified hepatic granuloma, liver is otherwise unremarkable. Cholelithiasis without CT findings of acute cholecystitis. PANCREAS: Normal. SPLEEN: Normal. ADRENALS/URINARY TRACT: Kidneys are orthotopic, demonstrating symmetric enhancement. LEFT extrarenal pelvis. No nephrolithiasis, hydronephrosis or solid renal masses. Homogeneously hypodense benign-appearing cyst lower pole LEFT kidney. Additional smaller LEFT renal cysts with too small to characterize hypodensities bilateral kidneys. The unopacified ureters are normal in course and caliber. Delayed imaging through the kidneys demonstrates symmetric prompt contrast excretion within the proximal urinary collecting system. Urinary bladder is well distended, mild prolapse. LEFT bladder diverticulum. Normal adrenal glands. STOMACH/BOWEL: Dilated small bowel measuring to 4 cm a with small bowel feces, focal transition point RIGHT lower quadrant (coronal image 38). Duodenal diverticulum. Severe colonic diverticulosis, decompressed: Without inflammation. VASCULAR/LYMPHATIC: Severe calcific atherosclerosis aortoiliac vessels with chronic infrarenal aortic dissection and 2.3 cm infrarenal aortic ectasia. No lymphadenopathy by CT size criteria. REPRODUCTIVE: Status post hysterectomy. Redemonstration of 4 cm RIGHT adnexal cyst. OTHER: Small amount of free fluid in central mesentery. MUSCULOSKELETAL: Nonacute.  Upper lumbar levoscoliosis. IMPRESSION: 1. Recurrent high-grade small-bowel obstruction, transition point RIGHT  lower quadrant most compatible with adhesion. 2. Cholelithiasis without CT findings of acute cholecystitis. 3. **An incidental finding of potential clinical significance has been found. 4 cm RIGHT adnexal cyst, recommend non emergent pelvic ultrasound. This recommendation follows ACR consensus guidelines: White Paper of the ACR Incidental Findings Committee II on Adnexal Findings. J Am Coll Radiol [phone].** Aortic Atherosclerosis (ICD10-I70.0). Electronically Signed   By: Elon Alas M.D.   On: 06/18/2018 03:02   Dg Abdomen Acute W/chest  Result Date: 06/18/2018 CLINICAL DATA:  Abdominal pain EXAM: DG ABDOMEN ACUTE W/ 1V CHEST COMPARISON:  04/17/2018, 04/16/2018 FINDINGS: Single-view chest demonstrates borderline cardiomegaly. Aortic atherosclerosis. No acute opacity or pleural effusion. Scoliosis of the spine. Supine and upright views of the abdomen demonstrate no free air. Multiple dilated loops of small bowel measuring up  to 4.4 cm with multiple fluid levels and paucity of distal gas consistent with a small bowel obstruction. IMPRESSION: 1. No radiographic evidence for acute cardiopulmonary abnormality. 2. Findings consistent with small bowel obstruction. Electronically Signed   By: Donavan Foil M.D.   On: 06/18/2018 01:32     Assessment/Plan Principal Problem:   SBO (small bowel obstruction) (HCC) Active Problems:   HTN (hypertension)   Chronic kidney disease, unspecified   Hyperlipidemia    1. Small bowel obstruction -General surgery has been consulted.  NG tube has been placed for low intermittent suction.  Repeat KUB in a.m.  Will keep patient n.p.o. IV fluids and pain medication.  Further recommendation per general surgery. 2. Hypertension we will keep patient on PRN IV hydralazine since patient is n.p.o. 3. Acute renal failure -likely from obstruction.  Gently hydrate follow metabolic panel. 4. Hyperlipidemia we will continue home medication once patient can take  orally. 5. Previous history of DVT.   DVT prophylaxis: SCDs. Code Status: Full code. Family Communication: Discussed with patient. Disposition Plan: Home. Consults called: General surgery. Admission status: Inpatient.   Rise Patience MD [facility] Pager [phone].  If 7PM-7AM, please contact night-coverage [email or web address] Password [email or web address]  06/18/2018, 4:22 AM

## 2018-06-18 NOTE — ED Notes (Signed)
Patient transported to X-ray 

## 2018-06-18 NOTE — ED Notes (Signed)
Patient removed her hearing aids and placed them in the case and put them in her purse.

## 2018-06-18 NOTE — Consult Note (Signed)
Reason for Consult: Small bowel obstruction Referring Physician: Dr. Jane Canary is an 84 y.o. female.  HPI: Patient is an 84 year old female with a history of chronic kidney disease, DVT, factor V Leiden deficiency, hypertension, who comes in with abdominal pain and nausea.  Patient states that after dinner approximately 7 PM she began having abdominal pain.  She had some nausea however no emesis.  Patient came to the ER for further evaluation.  Upon evaluation in the ER she underwent CT scan recurrent high-grade small bowel obstruction with transition point in the right lower quadrant.  I did review these scans personally.  Of note the patient was previously in the recently in the hospital secondary to small bowel obstruction in July 2019.  She did have a previous ex lap for small bowel obstruction due to bezoar in 2014.  General surgery was consulted for further evaluation and management.  Past Medical History:  Diagnosis Date  . Chronic kidney disease   . DVT (deep venous thrombosis) ([employer])   . Dysphonia   . Factor V Leiden ([place])   . Hypertension   . Osteoporosis   . PNA (pneumonia) 12/31/2012  . Pneumonitis 03/19/2013  . Small bowel obstruction [facility])     Past Surgical History:  Procedure Laterality Date  . ABDOMINAL HYSTERECTOMY    . APPENDECTOMY    . LAPAROTOMY N/A 01/01/2013   Procedure: EXPLORATORY LAPAROTOMY removal of smal bowel foreign body;  Surgeon: Madilyn Hook, DO;  Location: [facility];  Service: General;  Laterality: N/A;  . TONSILLECTOMY      Family History  Family history unknown: Yes    Social History:  reports that she quit smoking about 19 years ago. She has never used smokeless tobacco. She reports that she drinks alcohol. She reports that she does not use drugs.  Allergies:  Allergies  Allergen Reactions  . Diovan [Valsartan] Other (See Comments)    gas  . Hctz [Hydrochlorothiazide]     gas  . Tekamlo [Aliskiren-Amlodipine]     gas  .  Valturna [Aliskiren-Valsartan]     Gas     Medications: I have reviewed the patient's current medications.  Results for orders placed or performed during the hospital encounter of 06/18/18 (from the past 48 hour(s))  Basic metabolic panel     Status: Abnormal   Collection Time: 06/18/18  1:08 AM  Result Value Ref Range   Sodium 138 135 - 145 mmol/L   Potassium 4.8 3.5 - 5.1 mmol/L   Chloride 98 98 - 111 mmol/L   CO2 28 22 - 32 mmol/L   Glucose, Bld 151 (H) 70 - 99 mg/dL   BUN 37 (H) 8 - 23 mg/dL   Creatinine, Ser 1.24 (H) 0.44 - 1.00 mg/dL   Calcium 10.1 8.9 - 10.3 mg/dL   GFR calc non Af Amer 38 (L) >60 mL/min   GFR calc Af Amer 44 (L) >60 mL/min    Comment: (NOTE) The eGFR has been calculated using the CKD EPI equation. This calculation has not been validated in all clinical situations. eGFR's persistently <60 mL/min signify possible Chronic Kidney Disease.    Anion gap 12 5 - 15    Comment: Performed at [facility] [address] [place] [address]  CBC     Status: Abnormal   Collection Time: 06/18/18  1:08 AM  Result Value Ref Range   WBC 10.1 4.0 - 10.5 K/uL   RBC 4.56 3.87 - 5.11 MIL/uL  Hemoglobin 13.4 12.0 - 15.0 g/dL   HCT 42.4 36.0 - 46.0 %   MCV 93.0 78.0 - 100.0 fL   MCH 29.4 26.0 - 34.0 pg   MCHC 31.6 30.0 - 36.0 g/dL   RDW 14.5 11.5 - 15.5 %   Platelets 120 (L) 150 - 400 K/uL    Comment: Performed at [facility] [address] [place], [place] [address]  Lipase, blood     Status: Abnormal   Collection Time: 06/18/18  1:08 AM  Result Value Ref Range   Lipase 60 (H) 11 - 51 U/L    Comment: Performed at [facility], [place] [address], [place], [place] [address]  Troponin I     Status: None   Collection Time: 06/18/18  1:08 AM  Result Value Ref Range   Troponin I <0.03 <0.03 ng/mL    Comment: Performed at [facility] [address], [place], [place] [address]  I-stat troponin, ED     Status: None   Collection Time:  06/18/18  1:12 AM  Result Value Ref Range   Troponin i, poc 0.01 0.00 - 0.08 ng/mL   Comment 3            Comment: Due to the release kinetics of cTnI, a negative result within the first hours of the onset of symptoms does not rule out myocardial infarction with certainty. If myocardial infarction is still suspected, repeat the test at appropriate intervals.   I-Stat CG4 Lactic Acid, ED     Status: Abnormal   Collection Time: 06/18/18  3:09 AM  Result Value Ref Range   Lactic Acid, Venous 0.48 (L) 0.5 - 1.9 mmol/L  Hepatic function panel     Status: Abnormal   Collection Time: 06/18/18  5:10 AM  Result Value Ref Range   Total Protein 6.4 (L) 6.5 - 8.1 g/dL   Albumin 3.3 (L) 3.5 - 5.0 g/dL   AST 27 15 - 41 U/L   ALT 31 0 - 44 U/L   Alkaline Phosphatase 83 38 - 126 U/L   Total Bilirubin 0.8 0.3 - 1.2 mg/dL   Bilirubin, Direct 0.1 0.0 - 0.2 mg/dL   Indirect Bilirubin 0.7 0.3 - 0.9 mg/dL    Comment: Performed at [facility], [place] [address], [place], [place] [address]  CBC     Status: Abnormal   Collection Time: 06/18/18  5:10 AM  Result Value Ref Range   WBC 9.8 4.0 - 10.5 K/uL   RBC 4.72 3.87 - 5.11 MIL/uL   Hemoglobin 13.7 12.0 - 15.0 g/dL   HCT 43.1 36.0 - 46.0 %   MCV 91.3 78.0 - 100.0 fL   MCH 29.0 26.0 - 34.0 pg   MCHC 31.8 30.0 - 36.0 g/dL   RDW 14.3 11.5 - 15.5 %   Platelets 125 (L) 150 - 400 K/uL    Comment: Performed at [facility] [address], [place], [place] [address]    Ct Abdomen Pelvis W Contrast  Result Date: 06/18/2018 CLINICAL DATA:  Bowel obstruction. History of hysterectomy, appendectomy, exploratory laparotomy for small bowel foreign body removal. EXAM: CT ABDOMEN AND PELVIS WITH CONTRAST TECHNIQUE: Multidetector CT imaging of the abdomen and pelvis was performed using the standard protocol following bolus administration of intravenous contrast. CONTRAST:  34m OMNIPAQUE IOHEXOL 300 MG/ML  SOLN COMPARISON:  Abdominal radiograph June 18, 2018 and CT abdomen and pelvis April 16, 2018 in CT abdomen and  pelvis April 05, 2014 FINDINGS: LOWER CHEST: Stable 5 mm RIGHT lower lobe subsolid pulmonary nodule. No routine indicated follow-up. This recommendation follows the consensus statement: Guidelines for Management of Small Pulmonary Nodules Detected on CT Images: From the Fleischner Society 2017; Radiology 2017; 284:228-243. nodular scarring lingula. Bibasilar atelectasis/scarring. Code heart size is normal. Small pericardial effusion. HEPATOBILIARY: Calcified hepatic granuloma, liver is otherwise unremarkable. Cholelithiasis without CT findings of acute cholecystitis. PANCREAS: Normal. SPLEEN: Normal. ADRENALS/URINARY TRACT: Kidneys are orthotopic, demonstrating symmetric enhancement. LEFT extrarenal pelvis. No nephrolithiasis, hydronephrosis or solid renal masses. Homogeneously hypodense benign-appearing cyst lower pole LEFT kidney. Additional smaller LEFT renal cysts with too small to characterize hypodensities bilateral kidneys. The unopacified ureters are normal in course and caliber. Delayed imaging through the kidneys demonstrates symmetric prompt contrast excretion within the proximal urinary collecting system. Urinary bladder is well distended, mild prolapse. LEFT bladder diverticulum. Normal adrenal glands. STOMACH/BOWEL: Dilated small bowel measuring to 4 cm a with small bowel feces, focal transition point RIGHT lower quadrant (coronal image 38). Duodenal diverticulum. Severe colonic diverticulosis, decompressed: Without inflammation. VASCULAR/LYMPHATIC: Severe calcific atherosclerosis aortoiliac vessels with chronic infrarenal aortic dissection and 2.3 cm infrarenal aortic ectasia. No lymphadenopathy by CT size criteria. REPRODUCTIVE: Status post hysterectomy. Redemonstration of 4 cm RIGHT adnexal cyst. OTHER: Small amount of free fluid in central mesentery. MUSCULOSKELETAL: Nonacute.  Upper lumbar levoscoliosis. IMPRESSION: 1. Recurrent  high-grade small-bowel obstruction, transition point RIGHT lower quadrant most compatible with adhesion. 2. Cholelithiasis without CT findings of acute cholecystitis. 3. **An incidental finding of potential clinical significance has been found. 4 cm RIGHT adnexal cyst, recommend non emergent pelvic ultrasound. This recommendation follows ACR consensus guidelines: White Paper of the ACR Incidental Findings Committee II on Adnexal Findings. J Am Coll Radiol [phone].** Aortic Atherosclerosis (ICD10-I70.0). Electronically Signed   By: Elon Alas M.D.   On: 06/18/2018 03:02   Dg Abdomen Acute W/chest  Result Date: 06/18/2018 CLINICAL DATA:  Abdominal pain EXAM: DG ABDOMEN ACUTE W/ 1V CHEST COMPARISON:  04/17/2018, 04/16/2018 FINDINGS: Single-view chest demonstrates borderline cardiomegaly. Aortic atherosclerosis. No acute opacity or pleural effusion. Scoliosis of the spine. Supine and upright views of the abdomen demonstrate no free air. Multiple dilated loops of small bowel measuring up to 4.4 cm with multiple fluid levels and paucity of distal gas consistent with a small bowel obstruction. IMPRESSION: 1. No radiographic evidence for acute cardiopulmonary abnormality. 2. Findings consistent with small bowel obstruction. Electronically Signed   By: Donavan Foil M.D.   On: 06/18/2018 01:32    Review of Systems  Constitutional: Negative for chills, fever and malaise/fatigue.  HENT: Negative for ear discharge, hearing loss and sore throat.   Eyes: Negative for blurred vision and discharge.  Respiratory: Negative for cough and shortness of breath.   Cardiovascular: Negative for chest pain, orthopnea and leg swelling.  Gastrointestinal: Positive for abdominal pain and nausea. Negative for constipation, diarrhea, heartburn and vomiting.  Musculoskeletal: Negative for myalgias and neck pain.  Skin: Negative for itching and rash.  Neurological: Negative for dizziness, focal weakness, seizures and  loss of consciousness.  Endo/Heme/Allergies: Negative for environmental allergies. Does not bruise/bleed easily.  Psychiatric/Behavioral: Negative for depression and suicidal ideas.  All other systems reviewed and are negative.  Blood pressure (!) 179/80, pulse 73, temperature 97.9 F (36.6 C), temperature source Oral, resp. rate 18, height 5' 3"  (1.6 m), weight 56.7 kg, SpO2 99 %. Physical Exam  Constitutional: She is oriented to person, place, and time. Vital signs are normal. She appears well-developed  and well-nourished.  Conversant No acute distress  Eyes: Pupils are equal, round, and reactive to light. Conjunctivae, EOM and lids are normal. No scleral icterus.  No lid lag Moist conjunctiva  Neck: No tracheal tenderness present. No thyromegaly present.  No cervical lymphadenopathy  Cardiovascular: Normal rate, regular rhythm and intact distal pulses.  No murmur heard. Respiratory: Effort normal and breath sounds normal. She has no wheezes. She has no rales.  GI: Soft. She exhibits distension. There is no hepatosplenomegaly. There is tenderness (min). There is no rebound and no guarding. No hernia.  Musculoskeletal: Normal range of motion.  Neurological: She is alert and oriented to person, place, and time.  Normal gait and station  Skin: Skin is warm. No rash noted. No cyanosis. Nails show no clubbing.  Normal skin turgor  Psychiatric: Judgment normal.  Appropriate affect    Assessment/Plan: 84 year old female with recurrent SBO Factor V Leiden deficiency History of DVT Chronic kidney disease Hypertension  Plan: 1.  NG tube is placed.  We will begin small bowel obstruction protocol.  I did discuss with the patient she may require surgery if this is not resolved in the next 24 to 48 hours. 2.  We will continue to follow the patient  Reyes Ivan 06/18/2018, 6:36 AM

## 2018-06-18 NOTE — ED Provider Notes (Signed)
[facility] EMERGENCY DEPARTMENT Provider Note   CSN: [id number] Arrival date & time: 06/18/18  0022     History   Chief Complaint Chief Complaint  Patient presents with  . Abdominal Pain  . Chest Pain  . Nausea  . Bradycardia    HPI [name] is a 84 y.o. female.  Patient presents via EMS from home with sudden onset mid abdominal pain.  This occurred around 7 PM after she ate roast beef and cake for dinner.  It has Been progressively worsening since associate with nausea and abdominal distention.  No vomiting.  Last bowel moment was around 8 PM and was normal.  Patient does have a history of small bowel obstructions in the past.  These have not needed surgery.  She denies any chest pain or shortness of breath.  She states this pain feels different than a bowel obstruction and she is never had this kind of pain before.  Denies any back pain or chest pain.  Denies any pain with urination or blood in the urine.  The history is provided by the patient and the EMS personnel.  Abdominal Pain   Associated symptoms include nausea. Pertinent negatives include fever, diarrhea, vomiting, constipation, dysuria, hematuria, headaches, arthralgias and myalgias.  Chest Pain   Associated symptoms include abdominal pain and nausea. Pertinent negatives include no cough, no dizziness, no fever, no headaches, no shortness of breath, no vomiting and no weakness.    Past Medical History:  Diagnosis Date  . Chronic kidney disease   . DVT (deep venous thrombosis) ([place])   . Dysphonia   . Factor V Leiden ([place])   . Hypertension   . Osteoporosis   . PNA (pneumonia) 12/31/2012  . Pneumonitis 03/19/2013  . Small bowel obstruction [facility])     Patient Active Problem List   Diagnosis Date Noted  . Factor V Leiden ([place])   . Hyperlipidemia 03/19/2013  . Diabetes mellitus ([place]) 03/19/2013  . Anemia in chronic kidney disease(285.21) 02/14/2013  . Chronic kidney disease, unspecified  02/14/2013  . Secondary renovascular hypertension, benign 02/14/2013  . SBO (small bowel obstruction) ([place]) 12/31/2012  . HTN (hypertension) 12/31/2012  . Dehydration 12/31/2012    Past Surgical History:  Procedure Laterality Date  . ABDOMINAL HYSTERECTOMY    . APPENDECTOMY    . LAPAROTOMY N/A 01/01/2013   Procedure: EXPLORATORY LAPAROTOMY removal of smal bowel foreign body;  Surgeon: Madilyn Hook, DO;  Location: [facility] ORS;  Service: General;  Laterality: N/A;  . TONSILLECTOMY       OB History   None      Home Medications    Prior to Admission medications   Medication Sig Start Date End Date Taking? Authorizing Provider  acetaminophen (TYLENOL) 325 MG tablet Take 2 tablets (650 mg total) by mouth every 4 (four) hours as needed. Patient taking differently: Take 650 mg by mouth every 4 (four) hours as needed for mild pain.  01/21/13   Whiteheart, Cristal Ford, NP  amLODipine (NORVASC) 10 MG tablet Take 10 mg by mouth daily.    [provider]  aspirin EC 81 MG tablet Take 81 mg by mouth at bedtime.     [provider]  calcium carbonate (TUMS - DOSED IN MG ELEMENTAL CALCIUM) 500 MG chewable tablet Chew 1-2 tablets (200-400 mg of elemental calcium total) by mouth 3 (three) times daily as needed. Patient taking differently: Chew 1-2 tablets by mouth 3 (three) times daily as needed for indigestion.  01/21/13  Whiteheart, Cristal Ford, NP  cholecalciferol (VITAMIN D) 1000 units tablet Take 1,000 Units by mouth at bedtime.    [provider]  DENTAGEL 1.1 % GEL dental gel Take 1 application by mouth daily. Place on toothbrush and brush daily for 2 minutes to prevent tooth decay/sensitivity 01/16/18   [provider]  ferrous sulfate 325 (65 FE) MG tablet Take 325 mg by mouth daily with breakfast. Unknown dose    [provider]  ipratropium (ATROVENT) 0.06 % nasal spray Place 1 spray into both nostrils 3 (three) times daily. 04/13/18   [provider]  irbesartan (AVAPRO) 300 MG tablet Take 300 mg by mouth daily.    [provider]  lactobacillus acidophilus (BACID) TABS tablet Take 1 tablet by mouth at bedtime. Uses [employer] brand     [provider]  loratadine (CLARITIN) 10 MG tablet Take 10 mg by mouth daily as needed for allergies.     [provider]  Multiple Vitamin (MULTIVITAMIN WITH MINERALS) TABS Take 1 tablet by mouth daily.    [provider]  psyllium (METAMUCIL) 58.6 % packet Take 1 packet by mouth at bedtime.     [provider]  rosuvastatin (CRESTOR) 5 MG tablet Take 5 mg by mouth at bedtime.     [provider]    Family History History reviewed. No pertinent family history.  Social History Social History   Tobacco Use  . Smoking status: Former Smoker    Last attempt to quit: 05/04/1999    Years since quitting: 19.1  . Smokeless tobacco: Never Used  Substance Use Topics  . Alcohol use: Yes    Comment: occasionally  . Drug use: No     Allergies   Diovan [valsartan]; Hctz [hydrochlorothiazide]; Tekamlo [aliskiren-amlodipine]; and Valturna [aliskiren-valsartan]   Review of Systems Review of Systems  Constitutional: Negative for activity change, appetite change, fatigue and fever.  HENT: Negative for congestion, postnasal drip and rhinorrhea.   Eyes: Negative for visual disturbance.  Respiratory: Negative for cough, chest tightness and shortness of breath.   Cardiovascular: Positive for chest pain.  Gastrointestinal: Positive for abdominal pain and nausea. Negative for constipation, diarrhea and vomiting.  Genitourinary: Negative for dysuria, hematuria, vaginal bleeding and vaginal discharge.  Musculoskeletal: Negative for arthralgias and myalgias.  Neurological: Negative for dizziness, weakness and headaches.    all other systems are negative except as noted in the HPI and PMH.    Physical Exam Updated Vital Signs BP (!) 156/58   Pulse 63    Temp (!) 97.5 F (36.4 C) (Oral)   Resp 16   Ht 5\' 3"  (1.6 m)   Wt 56.7 kg   SpO2 100%   BMI 22.14 kg/m   Physical Exam  Constitutional: She is oriented to person, place, and time. She appears well-developed and well-nourished. No distress.  HENT:  Head: Normocephalic and atraumatic.  Mouth/Throat: Oropharynx is clear and moist. No oropharyngeal exudate.  Eyes: Pupils are equal, round, and reactive to light. Conjunctivae and EOM are normal.  Neck: Normal range of motion. Neck supple.  No meningismus.  Cardiovascular: Normal rate, regular rhythm, normal heart sounds and intact distal pulses.  No murmur heard. Equal femoral pulses bilaterally  Pulmonary/Chest: Effort normal and breath sounds normal. No respiratory distress.  Abdominal: Soft. She exhibits distension. There is tenderness. There is no rebound and no guarding.  Distended abdomen, voluntary guarding with mid periumbilical tenderness.  Musculoskeletal: Normal range of motion. She exhibits no edema or  tenderness.  Neurological: She is alert and oriented to person, place, and time. No cranial nerve deficit. She exhibits normal muscle tone. Coordination normal.   5/5 strength throughout. CN 2-12 intact.Equal grip strength.   Skin: Skin is warm.  Psychiatric: She has a normal mood and affect. Her behavior is normal.  Nursing note and vitals reviewed.    ED Treatments / Results  Labs (all labs ordered are listed, but only abnormal results are displayed) Labs Reviewed  BASIC METABOLIC PANEL - Abnormal; Notable for the following components:      Result Value   Glucose, Bld 151 (*)    BUN 37 (*)    Creatinine, Ser 1.24 (*)    GFR calc non Af Amer 38 (*)    GFR calc Af Amer 44 (*)    All other components within normal limits  CBC - Abnormal; Notable for the following components:   Platelets 120 (*)    All other components within normal limits  LIPASE, BLOOD - Abnormal; Notable for the following components:   Lipase 60  (*)    All other components within normal limits  I-STAT CG4 LACTIC ACID, ED - Abnormal; Notable for the following components:   Lactic Acid, Venous 0.48 (*)    All other components within normal limits  TROPONIN I  URINALYSIS, ROUTINE W REFLEX MICROSCOPIC  HEPATIC FUNCTION PANEL  BASIC METABOLIC PANEL  CBC  I-STAT TROPONIN, ED  I-STAT CG4 LACTIC ACID, ED    EKG EKG Interpretation  Date/Time:  Monday June 18 2018 00:23:00 EDT Ventricular Rate:  53 PR Interval:    QRS Duration: 105 QT Interval:  440 QTC Calculation: 414 R Axis:   0 Text Interpretation:  Sinus rhythm Low voltage, extremity and precordial leads No significant change was found Confirmed by Ezequiel Essex [phone]) on 06/18/2018 12:41:27 AM   Radiology Ct Abdomen Pelvis W Contrast  Result Date: 06/18/2018 CLINICAL DATA:  Bowel obstruction. History of hysterectomy, appendectomy, exploratory laparotomy for small bowel foreign body removal. EXAM: CT ABDOMEN AND PELVIS WITH CONTRAST TECHNIQUE: Multidetector CT imaging of the abdomen and pelvis was performed using the standard protocol following bolus administration of intravenous contrast. CONTRAST:  62mL OMNIPAQUE IOHEXOL 300 MG/ML  SOLN COMPARISON:  Abdominal radiograph June 18, 2018 and CT abdomen and pelvis April 16, 2018 in CT abdomen and pelvis April 05, 2014 FINDINGS: LOWER CHEST: Stable 5 mm RIGHT lower lobe subsolid pulmonary nodule. No routine indicated follow-up. This recommendation follows the consensus statement: Guidelines for Management of Small Pulmonary Nodules Detected on CT Images: From the Fleischner Society 2017; Radiology 2017; 284:228-243. nodular scarring lingula. Bibasilar atelectasis/scarring. Code heart size is normal. Small pericardial effusion. HEPATOBILIARY: Calcified hepatic granuloma, liver is otherwise unremarkable. Cholelithiasis without CT findings of acute cholecystitis. PANCREAS: Normal. SPLEEN: Normal. ADRENALS/URINARY TRACT: Kidneys  are orthotopic, demonstrating symmetric enhancement. LEFT extrarenal pelvis. No nephrolithiasis, hydronephrosis or solid renal masses. Homogeneously hypodense benign-appearing cyst lower pole LEFT kidney. Additional smaller LEFT renal cysts with too small to characterize hypodensities bilateral kidneys. The unopacified ureters are normal in course and caliber. Delayed imaging through the kidneys demonstrates symmetric prompt contrast excretion within the proximal urinary collecting system. Urinary bladder is well distended, mild prolapse. LEFT bladder diverticulum. Normal adrenal glands. STOMACH/BOWEL: Dilated small bowel measuring to 4 cm a with small bowel feces, focal transition point RIGHT lower quadrant (coronal image 38). Duodenal diverticulum. Severe colonic diverticulosis, decompressed: Without inflammation. VASCULAR/LYMPHATIC: Severe calcific atherosclerosis aortoiliac vessels with chronic infrarenal aortic dissection and 2.3 cm  infrarenal aortic ectasia. No lymphadenopathy by CT size criteria. REPRODUCTIVE: Status post hysterectomy. Redemonstration of 4 cm RIGHT adnexal cyst. OTHER: Small amount of free fluid in central mesentery. MUSCULOSKELETAL: Nonacute.  Upper lumbar levoscoliosis. IMPRESSION: 1. Recurrent high-grade small-bowel obstruction, transition point RIGHT lower quadrant most compatible with adhesion. 2. Cholelithiasis without CT findings of acute cholecystitis. 3. **An incidental finding of potential clinical significance has been found. 4 cm RIGHT adnexal cyst, recommend non emergent pelvic ultrasound. This recommendation follows ACR consensus guidelines: White Paper of the ACR Incidental Findings Committee II on Adnexal Findings. J Am Coll Radiol [phone].** Aortic Atherosclerosis (ICD10-I70.0). Electronically Signed   By: Elon Alas M.D.   On: 06/18/2018 03:02   Dg Abdomen Acute W/chest  Result Date: 06/18/2018 CLINICAL DATA:  Abdominal pain EXAM: DG ABDOMEN ACUTE W/ 1V  CHEST COMPARISON:  04/17/2018, 04/16/2018 FINDINGS: Single-view chest demonstrates borderline cardiomegaly. Aortic atherosclerosis. No acute opacity or pleural effusion. Scoliosis of the spine. Supine and upright views of the abdomen demonstrate no free air. Multiple dilated loops of small bowel measuring up to 4.4 cm with multiple fluid levels and paucity of distal gas consistent with a small bowel obstruction. IMPRESSION: 1. No radiographic evidence for acute cardiopulmonary abnormality. 2. Findings consistent with small bowel obstruction. Electronically Signed   By: Donavan Foil M.D.   On: 06/18/2018 01:32    Procedures Procedures (including critical care time)  Medications Ordered in ED Medications  fentaNYL (SUBLIMAZE) injection 50 mcg (has no administration in time range)  ondansetron (ZOFRAN) injection 4 mg (has no administration in time range)     Initial Impression / Assessment and Plan / ED Course  I have reviewed the triage vital signs and the nursing notes.  Pertinent labs & imaging results that were available during my care of the patient were reviewed by me and considered in my medical decision making (see chart for details).    Abdominal pain and distention with nausea.  Concerning for bowel obstruction.  Patient given pain control and IV fluids.  X-ray is concerning for small bowel obstruction. EKG nonacute.  Labs show no leukocytosis.  Lactate is normal.  NG tube placed, continue IV hydration.  Will obtain CT scan.  Admission for small bowel obstruction discussed with Dr. Hal Hope.  Request surgical consult.  Dr. Rosendo Gros will consult.  CT scan confirms high-grade bowel obstruction with transition point in the right lower quadrant.  Patient remains comfortable with NG tube in place, IV fluids continue, n.p.o. status.  Dr. Hal Hope to admit, Dr. Rosendo Gros to consult.  Final Clinical Impressions(s) / ED Diagnoses   Final diagnoses:  Small bowel obstruction [facility])     ED Discharge Orders    None       Ezequiel Essex, MD 06/18/18 [phone]

## 2018-06-18 NOTE — ED Notes (Signed)
Pt unable to urinate. Will call out when she can give sample

## 2018-06-18 NOTE — ED Triage Notes (Signed)
Pt presents from home with [facility] for abd pain and CP; pt states she ate a roast beef sandwich and lemon pound cake this evening and the pain has been persistent for the last 5 hrs with nausea; pt here with abd distension, hx of SBO; Last BM yesterday; EMS noted HR on scene to be in the 40s;  pt given 67mcg fentanyl and 4mg  zofran and 324mg  ASA enroute; pt arrives to ER CAOx4

## 2018-06-18 NOTE — Progress Notes (Signed)
PROGRESS NOTE                                                                                                                                                                                                             Patient Demographics:    [name], is a 84 y.o. female, DOB - 11-Jan-1929, XNA:[id number]  Admit date - 06/18/2018   Admitting Physician Rise Patience, MD  Outpatient Primary MD for the patient is Carol Ada, MD  LOS - 0   Chief Complaint  Patient presents with  . Abdominal Pain  . Chest Pain  . Nausea  . Bradycardia       Brief Narrative    This is a no charge noticed patient admitted earlier today   Subjective:    [name] today unfortunately she had one episode of vomiting after clamping her NGT for 1 hour .    Assessment  & Plan :    Principal Problem:   SBO (small bowel obstruction) (HCC) Active Problems:   HTN (hypertension)   Chronic kidney disease, unspecified   Hyperlipidemia  Small bowel obstruction  -General surgery has been consulted.  NG tube has been placed for low intermittent suction.  If no improvement within 1 to 2 days, likely will need surgical intervention -Management per primary surgical team  Hypertension we will keep patient on PRN IV hydralazine since patient is n.p.o.  Acute renal failure -likely volume depletion from her small bowel obstruction, repeat BMP in a.m.  Hyperkalemia-Kayexalate per rectum, repeat BMP in a.m.  Hyperlipidemia we will continue home medication once patient can take orally.  Previous history of DVT.    Code Status : full  Family Communication  : none at bedside  Disposition Plan  : pending further work up  Consults  :  Gen surgery  Procedures  : none  DVT Prophylaxis  :  [place] heparin  Lab Results  Component Value Date   PLT 125 (L) 06/18/2018    Antibiotics  :    Anti-infectives (From admission, onward)   None        Objective:   Vitals:   06/18/18 0204 06/18/18 0357 06/18/18 0430 06/18/18 0701  BP: (!) 153/65 (!) 145/57 (!) 179/80 (!) 128/58  Pulse:  64 73 74  Resp:  18 18   Temp:  97.9 F (36.6 C)   TempSrc:   Oral   SpO2:  95% 99%   Weight:      Height:        Wt Readings from Last 3 Encounters:  06/18/18 56.7 kg  04/16/18 56.7 kg  03/04/16 63 kg     Intake/Output Summary (Last 24 hours) at 06/18/2018 1647 Last data filed at 06/18/2018 1605 Gross per 24 hour  Intake 1246.25 ml  Output 2050 ml  Net -803.75 ml     Physical Exam  Awake Alert, pleasant Symmetrical Chest wall movement, Good air movement bilaterally, CTAB RRR,No Gallops,Rubs or new Murmurs, No Parasternal Heave +ve B.Sounds, abdomen distended, minimally tender, but no rebound or guarding  No Cyanosis, Clubbing or edema, No new Rash or bruise      Data Review:    CBC Recent Labs  Lab 06/18/18 0108 06/18/18 0510  WBC 10.1 9.8  HGB 13.4 13.7  HCT 42.4 43.1  PLT 120* 125*  MCV 93.0 91.3  MCH 29.4 29.0  MCHC 31.6 31.8  RDW 14.5 14.3    Chemistries  Recent Labs  Lab 06/18/18 0108 06/18/18 0510  NA 138 138  K 4.8 5.3*  CL 98 97*  CO2 28 31  GLUCOSE 151* 169*  BUN 37* 34*  CREATININE 1.24* 1.17*  CALCIUM 10.1 10.1  AST  --  27  ALT  --  31  ALKPHOS  --  83  BILITOT  --  0.8   ------------------------------------------------------------------------------------------------------------------ No results for input(s): CHOL, HDL, LDLCALC, TRIG, CHOLHDL, LDLDIRECT in the last 72 hours.  Lab Results  Component Value Date   HGBA1C 6.2 (H) 01/12/2013   ------------------------------------------------------------------------------------------------------------------ No results for input(s): TSH, T4TOTAL, T3FREE, THYROIDAB in the last 72 hours.  Invalid input(s): FREET3 ------------------------------------------------------------------------------------------------------------------ No results for  input(s): VITAMINB12, FOLATE, FERRITIN, TIBC, IRON, RETICCTPCT in the last 72 hours.  Coagulation profile No results for input(s): INR, PROTIME in the last 168 hours.  No results for input(s): DDIMER in the last 72 hours.  Cardiac Enzymes Recent Labs  Lab 06/18/18 0108  TROPONINI <0.03   ------------------------------------------------------------------------------------------------------------------ No results found for: BNP  Inpatient Medications  Scheduled Meds: Continuous Infusions: . sodium chloride 75 mL/hr at 06/18/18 1139  . sodium chloride 500 mL/hr at 06/18/18 0400   PRN Meds:.acetaminophen **OR** acetaminophen, fentaNYL (SUBLIMAZE) injection, hydrALAZINE, ondansetron **OR** ondansetron (ZOFRAN) IV  Micro Results No results found for this or any previous visit (from the past 240 hour(s)).  Radiology Reports Ct Abdomen Pelvis W Contrast  Result Date: 06/18/2018 CLINICAL DATA:  Bowel obstruction. History of hysterectomy, appendectomy, exploratory laparotomy for small bowel foreign body removal. EXAM: CT ABDOMEN AND PELVIS WITH CONTRAST TECHNIQUE: Multidetector CT imaging of the abdomen and pelvis was performed using the standard protocol following bolus administration of intravenous contrast. CONTRAST:  36mL OMNIPAQUE IOHEXOL 300 MG/ML  SOLN COMPARISON:  Abdominal radiograph June 18, 2018 and CT abdomen and pelvis April 16, 2018 in CT abdomen and pelvis April 05, 2014 FINDINGS: LOWER CHEST: Stable 5 mm RIGHT lower lobe subsolid pulmonary nodule. No routine indicated follow-up. This recommendation follows the consensus statement: Guidelines for Management of Small Pulmonary Nodules Detected on CT Images: From the Fleischner Society 2017; Radiology 2017; 284:228-243. nodular scarring lingula. Bibasilar atelectasis/scarring. Code heart size is normal. Small pericardial effusion. HEPATOBILIARY: Calcified hepatic granuloma, liver is otherwise unremarkable. Cholelithiasis without  CT findings of acute cholecystitis. PANCREAS: Normal. SPLEEN: Normal. ADRENALS/URINARY TRACT: Kidneys are orthotopic, demonstrating symmetric enhancement. LEFT extrarenal pelvis. No nephrolithiasis, hydronephrosis  or solid renal masses. Homogeneously hypodense benign-appearing cyst lower pole LEFT kidney. Additional smaller LEFT renal cysts with too small to characterize hypodensities bilateral kidneys. The unopacified ureters are normal in course and caliber. Delayed imaging through the kidneys demonstrates symmetric prompt contrast excretion within the proximal urinary collecting system. Urinary bladder is well distended, mild prolapse. LEFT bladder diverticulum. Normal adrenal glands. STOMACH/BOWEL: Dilated small bowel measuring to 4 cm a with small bowel feces, focal transition point RIGHT lower quadrant (coronal image 38). Duodenal diverticulum. Severe colonic diverticulosis, decompressed: Without inflammation. VASCULAR/LYMPHATIC: Severe calcific atherosclerosis aortoiliac vessels with chronic infrarenal aortic dissection and 2.3 cm infrarenal aortic ectasia. No lymphadenopathy by CT size criteria. REPRODUCTIVE: Status post hysterectomy. Redemonstration of 4 cm RIGHT adnexal cyst. OTHER: Small amount of free fluid in central mesentery. MUSCULOSKELETAL: Nonacute.  Upper lumbar levoscoliosis. IMPRESSION: 1. Recurrent high-grade small-bowel obstruction, transition point RIGHT lower quadrant most compatible with adhesion. 2. Cholelithiasis without CT findings of acute cholecystitis. 3. **An incidental finding of potential clinical significance has been found. 4 cm RIGHT adnexal cyst, recommend non emergent pelvic ultrasound. This recommendation follows ACR consensus guidelines: White Paper of the ACR Incidental Findings Committee II on Adnexal Findings. J Am Coll Radiol [phone].** Aortic Atherosclerosis (ICD10-I70.0). Electronically Signed   By: Elon Alas M.D.   On: 06/18/2018 03:02   Dg Abdomen  Acute W/chest  Result Date: 06/18/2018 CLINICAL DATA:  Abdominal pain EXAM: DG ABDOMEN ACUTE W/ 1V CHEST COMPARISON:  04/17/2018, 04/16/2018 FINDINGS: Single-view chest demonstrates borderline cardiomegaly. Aortic atherosclerosis. No acute opacity or pleural effusion. Scoliosis of the spine. Supine and upright views of the abdomen demonstrate no free air. Multiple dilated loops of small bowel measuring up to 4.4 cm with multiple fluid levels and paucity of distal gas consistent with a small bowel obstruction. IMPRESSION: 1. No radiographic evidence for acute cardiopulmonary abnormality. 2. Findings consistent with small bowel obstruction. Electronically Signed   By: Donavan Foil M.D.   On: 06/18/2018 01:32   Dg Abd Portable 1v-small Bowel Obstruction Protocol-initial, 8 Hr Delay  Result Date: 06/18/2018 CLINICAL DATA:  Small-bowel obstruction EXAM: PORTABLE ABDOMEN - 1 VIEW COMPARISON:  Abdominal radiograph of earlier today. FINDINGS: The nasogastric tube tip projects in the proximal gastric body with the tip at the level of the GE junction. There are loops of mildly distended gas-filled small bowel within the abdomen. There is contrast within the urinary bladder. There is moderate levocurvature centered at L3. IMPRESSION: Advancement of the nasogastric tube by 5-10 cm is recommended to assure that the proximal port remains below the GE junction. Fairly stable mild gaseous distention of small-bowel loops consistent with obstruction. Electronically Signed   By: David  Martinique M.D.   On: 06/18/2018 16:05   Dg Abd Portable 1v-small Bowel Protocol-position Verification  Result Date: 06/18/2018 CLINICAL DATA:  Assess nasogastric tube position. EXAM: PORTABLE ABDOMEN - 1 VIEW COMPARISON:  Chest x-ray of June 18, 2018 FINDINGS: The esophagogastric tubes tip projects in the gastric cardia with the proximal port just above the GE junction. There are loops of mildly distended gas-filled small bowel in the mid  to lower abdomen. No free extraluminal gas is observed. There is contrast within the renal collecting systems bilaterally with prominence of the left renal pelvis. IMPRESSION: High positioning of the esophagogastric tube. Advancement by 5-10 cm is recommended. Mid to distal small bowel obstruction. Prominence of the left renal pelvis likely reflecting an extrarenal pelvis given the appearance on the CT scan of earlier today. Electronically Signed  By: David  Martinique M.D.   On: 06/18/2018 07:24      Phillips Climes M.D on 06/18/2018 at 4:47 PM  Between 7am to 7pm - Pager - [phone]  After 7pm go to [email or web address] - password [facility]  [facility] -  Office  [phone]

## 2018-06-18 NOTE — Progress Notes (Signed)
Received patient from ED with NGT, ambulatory, VS with elevated BP at 179/80, O2Sat at 99% on RA and pain at 7/10.  Patient oriented to room, bed controls and call light and explained plan of care.  Administered PRN pain medication Fentanyl 25 mcg per order and noted effective.  Patient now resting on bed with both eyes closed. Will monitor and endorse to day shift RN appropriately.

## 2018-06-18 NOTE — ED Notes (Signed)
Unable to get lactic acid at this moment pt not in the room

## 2018-06-19 ENCOUNTER — Inpatient Hospital Stay (HOSPITAL_COMMUNITY): Payer: PPO

## 2018-06-19 LAB — CBC
HCT: 41.7 % (ref 36.0–46.0)
HEMOGLOBIN: 13.1 g/dL (ref 12.0–15.0)
MCH: 29.5 pg (ref 26.0–34.0)
MCHC: 31.4 g/dL (ref 30.0–36.0)
MCV: 93.9 fL (ref 78.0–100.0)
Platelets: 125 10*3/uL — ABNORMAL LOW (ref 150–400)
RBC: 4.44 MIL/uL (ref 3.87–5.11)
RDW: 15.1 % (ref 11.5–15.5)
WBC: 4.7 10*3/uL (ref 4.0–10.5)

## 2018-06-19 LAB — BASIC METABOLIC PANEL
Anion gap: 11 (ref 5–15)
BUN: 35 mg/dL — AB (ref 8–23)
CHLORIDE: 103 mmol/L (ref 98–111)
CO2: 30 mmol/L (ref 22–32)
CREATININE: 1.24 mg/dL — AB (ref 0.44–1.00)
Calcium: 9 mg/dL (ref 8.9–10.3)
GFR calc non Af Amer: 38 mL/min — ABNORMAL LOW (ref >60)
GFR, EST AFRICAN AMERICAN: 44 mL/min — AB (ref >60)
Glucose, Bld: 103 mg/dL — ABNORMAL HIGH (ref 70–99)
Potassium: 4.8 mmol/L (ref 3.5–5.1)
Sodium: 144 mmol/L (ref 135–145)

## 2018-06-19 LAB — SURGICAL PCR SCREEN
MRSA, PCR: NEGATIVE
Staphylococcus aureus: NEGATIVE

## 2018-06-19 LAB — MAGNESIUM: Magnesium: 1.9 mg/dL (ref 1.7–2.4)

## 2018-06-19 MED ORDER — HEPARIN SODIUM (PORCINE) 5000 UNIT/ML IJ SOLN
5000.0000 [IU] | Freq: Three times a day (TID) | INTRAMUSCULAR | Status: DC
  Administered 2018-06-19 – 2018-06-23 (×12): 5000 [IU] via SUBCUTANEOUS
  Filled 2018-06-19 (×11): qty 1

## 2018-06-19 MED ORDER — SODIUM CHLORIDE 0.9 % IV SOLN
1.0000 g | INTRAVENOUS | Status: AC
  Filled 2018-06-19: qty 1

## 2018-06-19 MED ORDER — SODIUM CHLORIDE 0.9 % IV SOLN: INTRAVENOUS | Status: DC

## 2018-06-19 MED ORDER — DEXTROSE-NACL 5-0.45 % IV SOLN
INTRAVENOUS | Status: DC
  Administered 2018-06-19 – 2018-06-21 (×3): via INTRAVENOUS
  Filled 2018-06-19: qty 1000

## 2018-06-19 MED ORDER — SODIUM CHLORIDE 0.9 % IV SOLN: INTRAVENOUS | Status: AC

## 2018-06-19 NOTE — Progress Notes (Signed)
[facility]/Trauma Progress Note      Assessment/Plan Factor V Leiden deficiency History of DVT Chronic kidney disease Hypertension  Recurrent SBO - had 2 BM's yesterday - NGT output 150cc in last 12 hours - abd film pending - will clamp before pulling due to episode of vomiting yesterday with clamping (per medicine)  FEN: NGT clamped, allow sips VTE: SCD's, heparin ID: none Foley: none Follow up: TBD  DISPO: ordered heparin sq, clamping trial, abd xray pending    LOS: 1 day    Subjective: CC: SBO  Pt is feeling much better. She had 2 "good" BM's yesterday. No issues overnight. Per medicine pt had vomited yesterday after clamping NGT. No family at bedside.   Objective: Vital signs in last 24 hours: Temp:  [98.1 F (36.7 C)-99 F (37.2 C)] 98.1 F (36.7 C) (09/17 0541) Pulse Rate:  [82-89] 84 (09/17 0541) Resp:  [17-18] 17 (09/17 0541) BP: (120-131)/(52-58) 120/52 (09/17 0541) SpO2:  [89 %-99 %] 99 % (09/17 0541) Last BM Date: 06/18/18  Intake/Output from previous day: 09/16 0701 - 09/17 0700 In: 1718.7 [I.V.:1718.7] Out: 1350 [Urine:350; Emesis/NG output:1000] Intake/Output this shift: No intake/output data recorded.  PE: Gen:  Alert, NAD, pleasant, cooperative HEENT: NGT in place, dark liquid in canister  Pulm:  Rate and effort normal Abd: Soft, mild distention, NT, +BS, no HSM Skin: no rashes noted, warm and dry   Anti-infectives: Anti-infectives (From admission, onward)   None      Lab Results:  Recent Labs    06/18/18 0510 06/19/18 0635  WBC 9.8 4.7  HGB 13.7 13.1  HCT 43.1 41.7  PLT 125* 125*   BMET Recent Labs    06/18/18 0510 06/19/18 0635  NA 138 144  K 5.3* 4.8  CL 97* 103  CO2 31 30  GLUCOSE 169* 103*  BUN 34* 35*  CREATININE 1.17* 1.24*  CALCIUM 10.1 9.0   PT/INR No results for input(s): LABPROT, INR in the last 72 hours. CMP     Component Value Date/Time   NA 144 06/19/2018 0635   K 4.8  06/19/2018 0635   CL 103 06/19/2018 0635   CO2 30 06/19/2018 0635   GLUCOSE 103 (H) 06/19/2018 0635   BUN 35 (H) 06/19/2018 0635   CREATININE 1.24 (H) 06/19/2018 0635   CALCIUM 9.0 06/19/2018 0635   PROT 6.4 (L) 06/18/2018 0510   ALBUMIN 3.3 (L) 06/18/2018 0510   AST 27 06/18/2018 0510   ALT 31 06/18/2018 0510   ALKPHOS 83 06/18/2018 0510   BILITOT 0.8 06/18/2018 0510   GFRNONAA 38 (L) 06/19/2018 0635   GFRAA 44 (L) 06/19/2018 0635   Lipase     Component Value Date/Time   LIPASE 60 (H) 06/18/2018 0108    Studies/Results: Ct Abdomen Pelvis W Contrast  Result Date: 06/18/2018 CLINICAL DATA:  Bowel obstruction. History of hysterectomy, appendectomy, exploratory laparotomy for small bowel foreign body removal. EXAM: CT ABDOMEN AND PELVIS WITH CONTRAST TECHNIQUE: Multidetector CT imaging of the abdomen and pelvis was performed using the standard protocol following bolus administration of intravenous contrast. CONTRAST:  26mL OMNIPAQUE IOHEXOL 300 MG/ML  SOLN COMPARISON:  Abdominal radiograph June 18, 2018 and CT abdomen and pelvis April 16, 2018 in CT abdomen and pelvis April 05, 2014 FINDINGS: LOWER CHEST: Stable 5 mm RIGHT lower lobe subsolid pulmonary nodule. No routine indicated follow-up. This recommendation follows the consensus statement: Guidelines for Management of Small Pulmonary Nodules Detected on CT Images: From the Fleischner Society 2017; Radiology  2017; 093:235-573. nodular scarring lingula. Bibasilar atelectasis/scarring. Code heart size is normal. Small pericardial effusion. HEPATOBILIARY: Calcified hepatic granuloma, liver is otherwise unremarkable. Cholelithiasis without CT findings of acute cholecystitis. PANCREAS: Normal. SPLEEN: Normal. ADRENALS/URINARY TRACT: Kidneys are orthotopic, demonstrating symmetric enhancement. LEFT extrarenal pelvis. No nephrolithiasis, hydronephrosis or solid renal masses. Homogeneously hypodense benign-appearing cyst lower pole LEFT kidney.  Additional smaller LEFT renal cysts with too small to characterize hypodensities bilateral kidneys. The unopacified ureters are normal in course and caliber. Delayed imaging through the kidneys demonstrates symmetric prompt contrast excretion within the proximal urinary collecting system. Urinary bladder is well distended, mild prolapse. LEFT bladder diverticulum. Normal adrenal glands. STOMACH/BOWEL: Dilated small bowel measuring to 4 cm a with small bowel feces, focal transition point RIGHT lower quadrant (coronal image 38). Duodenal diverticulum. Severe colonic diverticulosis, decompressed: Without inflammation. VASCULAR/LYMPHATIC: Severe calcific atherosclerosis aortoiliac vessels with chronic infrarenal aortic dissection and 2.3 cm infrarenal aortic ectasia. No lymphadenopathy by CT size criteria. REPRODUCTIVE: Status post hysterectomy. Redemonstration of 4 cm RIGHT adnexal cyst. OTHER: Small amount of free fluid in central mesentery. MUSCULOSKELETAL: Nonacute.  Upper lumbar levoscoliosis. IMPRESSION: 1. Recurrent high-grade small-bowel obstruction, transition point RIGHT lower quadrant most compatible with adhesion. 2. Cholelithiasis without CT findings of acute cholecystitis. 3. **An incidental finding of potential clinical significance has been found. 4 cm RIGHT adnexal cyst, recommend non emergent pelvic ultrasound. This recommendation follows ACR consensus guidelines: White Paper of the ACR Incidental Findings Committee II on Adnexal Findings. J Am Coll Radiol [phone].** Aortic Atherosclerosis (ICD10-I70.0). Electronically Signed   By: Elon Alas M.D.   On: 06/18/2018 03:02   Dg Abdomen Acute W/chest  Result Date: 06/18/2018 CLINICAL DATA:  Abdominal pain EXAM: DG ABDOMEN ACUTE W/ 1V CHEST COMPARISON:  04/17/2018, 04/16/2018 FINDINGS: Single-view chest demonstrates borderline cardiomegaly. Aortic atherosclerosis. No acute opacity or pleural effusion. Scoliosis of the spine. Supine and  upright views of the abdomen demonstrate no free air. Multiple dilated loops of small bowel measuring up to 4.4 cm with multiple fluid levels and paucity of distal gas consistent with a small bowel obstruction. IMPRESSION: 1. No radiographic evidence for acute cardiopulmonary abnormality. 2. Findings consistent with small bowel obstruction. Electronically Signed   By: Donavan Foil M.D.   On: 06/18/2018 01:32   Dg Abd Portable 1v-small Bowel Obstruction Protocol-initial, 8 Hr Delay  Result Date: 06/18/2018 CLINICAL DATA:  Small-bowel obstruction EXAM: PORTABLE ABDOMEN - 1 VIEW COMPARISON:  Abdominal radiograph of earlier today. FINDINGS: The nasogastric tube tip projects in the proximal gastric body with the tip at the level of the GE junction. There are loops of mildly distended gas-filled small bowel within the abdomen. There is contrast within the urinary bladder. There is moderate levocurvature centered at L3. IMPRESSION: Advancement of the nasogastric tube by 5-10 cm is recommended to assure that the proximal port remains below the GE junction. Fairly stable mild gaseous distention of small-bowel loops consistent with obstruction. Electronically Signed   By: David  Martinique M.D.   On: 06/18/2018 16:05   Dg Abd Portable 1v-small Bowel Protocol-position Verification  Result Date: 06/18/2018 CLINICAL DATA:  Assess nasogastric tube position. EXAM: PORTABLE ABDOMEN - 1 VIEW COMPARISON:  Chest x-ray of June 18, 2018 FINDINGS: The esophagogastric tubes tip projects in the gastric cardia with the proximal port just above the GE junction. There are loops of mildly distended gas-filled small bowel in the mid to lower abdomen. No free extraluminal gas is observed. There is contrast within the renal collecting systems bilaterally with prominence  of the left renal pelvis. IMPRESSION: High positioning of the esophagogastric tube. Advancement by 5-10 cm is recommended. Mid to distal small bowel obstruction.  Prominence of the left renal pelvis likely reflecting an extrarenal pelvis given the appearance on the CT scan of earlier today. Electronically Signed   By: David  Martinique M.D.   On: 06/18/2018 07:24      Kalman Drape , [facility] 06/19/2018, 8:28 AM  Pager: [phone] Mon-Wed, Friday 7:00am-4:30pm Thurs 7am-11:30am  Consults: [phone]

## 2018-06-19 NOTE — Progress Notes (Signed)
PROGRESS NOTE                                                                                                                                                                                                             Patient Demographics:    [name], is a 84 y.o. female, DOB - January 20, 1929, KGY:[id number]  Admit date - 06/18/2018   Admitting Physician Rise Patience, MD  Outpatient Primary MD for the patient is Carol Ada, MD  LOS - 1   Chief Complaint  Patient presents with  . Abdominal Pain  . Chest Pain  . Nausea  . Bradycardia       Brief Narrative    84 y.o. female with history of hypertension, hyperlipidemia chronic kidney disease who has had recurrent bowel obstructions and has had previous surgery for bowel obstruction in 2014 was admitted for similar complaints and July 2019 2 months ago and at that time was managed conservatively , admitted with small bowel obstruction.   Subjective:    [name] today reports 2 bowel movements yesterday, no further nausea or vomiting    Assessment  & Plan :    Principal Problem:   SBO (small bowel obstruction) (HCC) Active Problems:   HTN (hypertension)   Chronic kidney disease, unspecified   Hyperlipidemia  Small bowel obstruction  -Management per general surgery, she had bowel movements x2 yesterday, abdomen is less tense distended today, but no significant improvement on imaging, per general surgery note, possibly will need surgical intervention , now continue with NGT on suction, IV fluids, n.p.o. .  Hypertension  -Pressure controlled without any meds as they are on hold due to n.p.o. status, she is on PRN hydralazine   Acute renal failure  -Baseline creatinine 0.9, 1.2 on admission in the setting of volume depletion secondary to small bowel obstruction, continue with IV fluids.  Will change to D5 half-normal saline secondary to sodium of  144.   Hyperkalemia -Potassium 5.3, received Kayexalate per rectum, it is 4.8 today  Hyperlipidemia -Resume statin when able to take oral  Previous history of DVT. -Continue with DVT prophylaxis during hospital stay    Code Status : full  Family Communication  : Left son a voicemail  Disposition Plan  : pending further work up  Consults  :  Gen surgery  Procedures  :  none  DVT Prophylaxis  :  [place] heparin  Lab Results  Component Value Date   PLT 125 (L) 06/19/2018    Antibiotics  :    Anti-infectives (From admission, onward)   Start     Dose/Rate Route Frequency Ordered Stop   06/20/18 1300  cefoTEtan (CEFOTAN) 1 g in sodium chloride 0.9 % 100 mL IVPB     1 g 200 mL/hr over 30 Minutes Intravenous To ShortStay Surgical 06/19/18 1452 06/21/18 1300        Objective:   Vitals:   06/18/18 1801 06/18/18 1953 06/18/18 2207 06/19/18 0541  BP: (!) 123/57 (!) 131/58  (!) 120/52  Pulse: 89 82  84  Resp: 18 17  17   Temp: 99 F (37.2 C) 98.3 F (36.8 C)  98.1 F (36.7 C)  TempSrc: Oral Oral  Oral  SpO2: (!) 89% (!) 89% 96% 99%  Weight:      Height:        Wt Readings from Last 3 Encounters:  06/18/18 56.7 kg  04/16/18 56.7 kg  03/04/16 63 kg     Intake/Output Summary (Last 24 hours) at 06/19/2018 1522 Last data filed at 06/19/2018 0900 Gross per 24 hour  Intake 1205.17 ml  Output 800 ml  Net 405.17 ml     Physical Exam  Awake Alert, pleasant, in no apparent distress  symmetrical Chest wall movement, Good air movement bilaterally, CTAB RRR,No Gallops,Rubs or new Murmurs, No Parasternal Heave +ve B.Sounds, abdomen is less distended today, no rebound - guarding or rigidity. No Cyanosis, Clubbing or edema, No new Rash or bruise       Data Review:    CBC Recent Labs  Lab 06/18/18 0108 06/18/18 0510 06/19/18 0635  WBC 10.1 9.8 4.7  HGB 13.4 13.7 13.1  HCT 42.4 43.1 41.7  PLT 120* 125* 125*  MCV 93.0 91.3 93.9  MCH 29.4 29.0 29.5  MCHC 31.6  31.8 31.4  RDW 14.5 14.3 15.1    Chemistries  Recent Labs  Lab 06/18/18 0108 06/18/18 0510 06/19/18 0635  NA 138 138 144  K 4.8 5.3* 4.8  CL 98 97* 103  CO2 28 31 30   GLUCOSE 151* 169* 103*  BUN 37* 34* 35*  CREATININE 1.24* 1.17* 1.24*  CALCIUM 10.1 10.1 9.0  MG  --   --  1.9  AST  --  27  --   ALT  --  31  --   ALKPHOS  --  83  --   BILITOT  --  0.8  --    ------------------------------------------------------------------------------------------------------------------ No results for input(s): CHOL, HDL, LDLCALC, TRIG, CHOLHDL, LDLDIRECT in the last 72 hours.  Lab Results  Component Value Date   HGBA1C 6.2 (H) 01/12/2013   ------------------------------------------------------------------------------------------------------------------ No results for input(s): TSH, T4TOTAL, T3FREE, THYROIDAB in the last 72 hours.  Invalid input(s): FREET3 ------------------------------------------------------------------------------------------------------------------ No results for input(s): VITAMINB12, FOLATE, FERRITIN, TIBC, IRON, RETICCTPCT in the last 72 hours.  Coagulation profile No results for input(s): INR, PROTIME in the last 168 hours.  No results for input(s): DDIMER in the last 72 hours.  Cardiac Enzymes Recent Labs  Lab 06/18/18 0108  TROPONINI <0.03   ------------------------------------------------------------------------------------------------------------------ No results found for: BNP  Inpatient Medications  Scheduled Meds: . heparin injection (subcutaneous)  5,000 Units Subcutaneous Q8H   Continuous Infusions: . sodium chloride    . [START ON 06/20/2018] cefoTEtan (CEFOTAN) IV    . sodium chloride 500 mL/hr at 06/18/18 0400   PRN Meds:.acetaminophen **OR** acetaminophen,  fentaNYL (SUBLIMAZE) injection, hydrALAZINE, ondansetron **OR** ondansetron (ZOFRAN) IV  Micro Results No results found for this or any previous visit (from the past 240  hour(s)).  Radiology Reports Dg Abd 1 View  Result Date: 06/19/2018 CLINICAL DATA:  84 year old female with small bowel obstruction EXAM: ABDOMEN - 1 VIEW COMPARISON:  Prior abdominal radiograph 06/18/2018 FINDINGS: The gastric tube has been advanced. The tip overlies the gastric body in the proximal side hole overlies the gastric fundus. This is a good position. Persistent loops of air-filled and mildly dilated small bowel in the mid abdomen. No significant interval progression compared to yesterday. Gas and stool are again noted throughout the colon. Atherosclerotic calcifications noted in the abdominal aorta. No acute osseous abnormality. Levoconvex lumbar scoliosis with multilevel degenerative disc disease. IMPRESSION: 1. Well-positioned gastric tube. 2. Persistent at least partial small bowel obstruction. No significant interval change compared to yesterday's imaging. 3.  Aortic Atherosclerosis (ICD10-170.0) Electronically Signed   By: Jacqulynn Cadet M.D.   On: 06/19/2018 09:21   Ct Abdomen Pelvis W Contrast  Result Date: 06/18/2018 CLINICAL DATA:  Bowel obstruction. History of hysterectomy, appendectomy, exploratory laparotomy for small bowel foreign body removal. EXAM: CT ABDOMEN AND PELVIS WITH CONTRAST TECHNIQUE: Multidetector CT imaging of the abdomen and pelvis was performed using the standard protocol following bolus administration of intravenous contrast. CONTRAST:  32mL OMNIPAQUE IOHEXOL 300 MG/ML  SOLN COMPARISON:  Abdominal radiograph June 18, 2018 and CT abdomen and pelvis April 16, 2018 in CT abdomen and pelvis April 05, 2014 FINDINGS: LOWER CHEST: Stable 5 mm RIGHT lower lobe subsolid pulmonary nodule. No routine indicated follow-up. This recommendation follows the consensus statement: Guidelines for Management of Small Pulmonary Nodules Detected on CT Images: From the Fleischner Society 2017; Radiology 2017; 284:228-243. nodular scarring lingula. Bibasilar atelectasis/scarring. Code  heart size is normal. Small pericardial effusion. HEPATOBILIARY: Calcified hepatic granuloma, liver is otherwise unremarkable. Cholelithiasis without CT findings of acute cholecystitis. PANCREAS: Normal. SPLEEN: Normal. ADRENALS/URINARY TRACT: Kidneys are orthotopic, demonstrating symmetric enhancement. LEFT extrarenal pelvis. No nephrolithiasis, hydronephrosis or solid renal masses. Homogeneously hypodense benign-appearing cyst lower pole LEFT kidney. Additional smaller LEFT renal cysts with too small to characterize hypodensities bilateral kidneys. The unopacified ureters are normal in course and caliber. Delayed imaging through the kidneys demonstrates symmetric prompt contrast excretion within the proximal urinary collecting system. Urinary bladder is well distended, mild prolapse. LEFT bladder diverticulum. Normal adrenal glands. STOMACH/BOWEL: Dilated small bowel measuring to 4 cm a with small bowel feces, focal transition point RIGHT lower quadrant (coronal image 38). Duodenal diverticulum. Severe colonic diverticulosis, decompressed: Without inflammation. VASCULAR/LYMPHATIC: Severe calcific atherosclerosis aortoiliac vessels with chronic infrarenal aortic dissection and 2.3 cm infrarenal aortic ectasia. No lymphadenopathy by CT size criteria. REPRODUCTIVE: Status post hysterectomy. Redemonstration of 4 cm RIGHT adnexal cyst. OTHER: Small amount of free fluid in central mesentery. MUSCULOSKELETAL: Nonacute.  Upper lumbar levoscoliosis. IMPRESSION: 1. Recurrent high-grade small-bowel obstruction, transition point RIGHT lower quadrant most compatible with adhesion. 2. Cholelithiasis without CT findings of acute cholecystitis. 3. **An incidental finding of potential clinical significance has been found. 4 cm RIGHT adnexal cyst, recommend non emergent pelvic ultrasound. This recommendation follows ACR consensus guidelines: White Paper of the ACR Incidental Findings Committee II on Adnexal Findings. J Am Coll  Radiol [id number].** Aortic Atherosclerosis (ICD10-I70.0). Electronically Signed   By: Elon Alas M.D.   On: 06/18/2018 03:02   Dg Abdomen Acute W/chest  Result Date: 06/18/2018 CLINICAL DATA:  Abdominal pain EXAM: DG ABDOMEN ACUTE W/ 1V CHEST COMPARISON:  04/17/2018, 04/16/2018 FINDINGS: Single-view chest demonstrates borderline cardiomegaly. Aortic atherosclerosis. No acute opacity or pleural effusion. Scoliosis of the spine. Supine and upright views of the abdomen demonstrate no free air. Multiple dilated loops of small bowel measuring up to 4.4 cm with multiple fluid levels and paucity of distal gas consistent with a small bowel obstruction. IMPRESSION: 1. No radiographic evidence for acute cardiopulmonary abnormality. 2. Findings consistent with small bowel obstruction. Electronically Signed   By: Donavan Foil M.D.   On: 06/18/2018 01:32   Dg Abd Portable 1v-small Bowel Obstruction Protocol-initial, 8 Hr Delay  Result Date: 06/18/2018 CLINICAL DATA:  Small-bowel obstruction EXAM: PORTABLE ABDOMEN - 1 VIEW COMPARISON:  Abdominal radiograph of earlier today. FINDINGS: The nasogastric tube tip projects in the proximal gastric body with the tip at the level of the GE junction. There are loops of mildly distended gas-filled small bowel within the abdomen. There is contrast within the urinary bladder. There is moderate levocurvature centered at L3. IMPRESSION: Advancement of the nasogastric tube by 5-10 cm is recommended to assure that the proximal port remains below the GE junction. Fairly stable mild gaseous distention of small-bowel loops consistent with obstruction. Electronically Signed   By: David  Martinique M.D.   On: 06/18/2018 16:05   Dg Abd Portable 1v-small Bowel Protocol-position Verification  Result Date: 06/18/2018 CLINICAL DATA:  Assess nasogastric tube position. EXAM: PORTABLE ABDOMEN - 1 VIEW COMPARISON:  Chest x-ray of June 18, 2018 FINDINGS: The esophagogastric tubes tip  projects in the gastric cardia with the proximal port just above the GE junction. There are loops of mildly distended gas-filled small bowel in the mid to lower abdomen. No free extraluminal gas is observed. There is contrast within the renal collecting systems bilaterally with prominence of the left renal pelvis. IMPRESSION: High positioning of the esophagogastric tube. Advancement by 5-10 cm is recommended. Mid to distal small bowel obstruction. Prominence of the left renal pelvis likely reflecting an extrarenal pelvis given the appearance on the CT scan of earlier today. Electronically Signed   By: David  Martinique M.D.   On: 06/18/2018 07:24      Phillips Climes M.D on 06/19/2018 at 3:22 PM  Between 7am to 7pm - Pager - [phone]  After 7pm go to [email or web address] - password [facility]  [facility] -  Office  [phone]

## 2018-06-20 ENCOUNTER — Encounter (HOSPITAL_COMMUNITY)
Admission: EM | Disposition: A | Discharge status: Home / Self Care | Payer: Self-pay | Source: Home / Self Care | Attending: Internal Medicine

## 2018-06-20 ENCOUNTER — Inpatient Hospital Stay (HOSPITAL_COMMUNITY): Payer: PPO

## 2018-06-20 DIAGNOSIS — N182 Chronic kidney disease, stage 2 (mild): Secondary | ICD-10-CM

## 2018-06-20 DIAGNOSIS — I10 Essential (primary) hypertension: Secondary | ICD-10-CM

## 2018-06-20 DIAGNOSIS — K56609 Unspecified intestinal obstruction, unspecified as to partial versus complete obstruction: Principal | ICD-10-CM

## 2018-06-20 LAB — BASIC METABOLIC PANEL
Anion gap: 9 (ref 5–15)
BUN: 20 mg/dL (ref 8–23)
CALCIUM: 7.9 mg/dL — AB (ref 8.9–10.3)
CO2: 27 mmol/L (ref 22–32)
Chloride: 103 mmol/L (ref 98–111)
Creatinine, Ser: 0.86 mg/dL (ref 0.44–1.00)
GFR, EST NON AFRICAN AMERICAN: 59 mL/min — AB (ref >60)
Glucose, Bld: 232 mg/dL — ABNORMAL HIGH (ref 70–99)
Potassium: 3.7 mmol/L (ref 3.5–5.1)
SODIUM: 139 mmol/L (ref 135–145)

## 2018-06-20 LAB — CBC
HCT: 39.9 % (ref 36.0–46.0)
HEMOGLOBIN: 12.6 g/dL (ref 12.0–15.0)
MCH: 29.2 pg (ref 26.0–34.0)
MCHC: 31.6 g/dL (ref 30.0–36.0)
MCV: 92.6 fL (ref 78.0–100.0)
PLATELETS: 109 10*3/uL — AB (ref 150–400)
RBC: 4.31 MIL/uL (ref 3.87–5.11)
RDW: 14.9 % (ref 11.5–15.5)
WBC: 5 10*3/uL (ref 4.0–10.5)

## 2018-06-20 SURGERY — LAPAROTOMY, EXPLORATORY
Anesthesia: General

## 2018-06-20 NOTE — Evaluation (Signed)
Physical Therapy Evaluation Patient Details Name: [name] MRN: [id number] DOB: 1928/11/15 Today's Date: 06/20/2018   History of Present Illness  Pt is an 84 y/o female admitted secondary to recurrent SBO. PMH including but not limited to CKD, HTN and hx of ex lap for SBO in 2014.    Clinical Impression  Pt presented sitting OOB in recliner chair, awake and willing to participate in therapy session. Prior to admission, pt reported that she was independent with all functional mobility and ADLs. Pt lives alone but has family in town that can provide 24/7 supervision/assistance upon d/c initially. Pt currently able to perform transfers with supervision and ambulate in hallway with min guard while pushing her IV pole. Pt enjoys being active and would like to ambulate 2-3 times a day while admitted. Encouraged pt to call for her tech to assist her with ambulation throughout the day. PT will continue to follow pt acutely to progress mobility as tolerated.      Follow Up Recommendations No PT follow up;Supervision - Intermittent    Equipment Recommendations  None recommended by PT    Recommendations for Other Services       Precautions / Restrictions Precautions Precautions: None Precaution Comments: NG tube Restrictions Weight Bearing Restrictions: No      Mobility  Bed Mobility               General bed mobility comments: pt OOB in recliner chair upon arrival  Transfers Overall transfer level: Needs assistance Equipment used: None Transfers: Sit to/from Stand Sit to Stand: Supervision         General transfer comment: supervision for safety  Ambulation/Gait Ambulation/Gait assistance: Min guard;Supervision Gait Distance (Feet): 400 Feet Assistive device: IV Pole Gait Pattern/deviations: Step-through pattern;Decreased stride length Gait velocity: decreased Gait velocity interpretation: 1.31 - 2.62 ft/sec, indicative of limited community ambulator General  Gait Details: pt with mild occasional instability with navigating around obstacles in hallway while pushing IV pole.   Stairs            Wheelchair Mobility    Modified Rankin (Stroke Patients Only)       Balance Overall balance assessment: Needs assistance Sitting-balance support: Feet supported Sitting balance-Leahy Scale: Good     Standing balance support: During functional activity;No upper extremity supported Standing balance-Leahy Scale: Good                               Pertinent Vitals/Pain Pain Assessment: No/denies pain    Home Living Family/patient expects to be discharged to:: Private residence Living Arrangements: Alone Available Help at Discharge: Family;Available 24 hours/day Type of Home: House Home Access: Stairs to enter Entrance Stairs-Rails: Psychiatric nurse of Steps: 2 Home Layout: One level Home Equipment: Environmental consultant - 2 wheels      Prior Function Level of Independence: Independent         Comments: pt exercises at the gym 3x/week     Hand Dominance        Extremity/Trunk Assessment   Upper Extremity Assessment Upper Extremity Assessment: Overall WFL for tasks assessed    Lower Extremity Assessment Lower Extremity Assessment: Overall WFL for tasks assessed       Communication   Communication: No difficulties  Cognition Arousal/Alertness: Awake/alert Behavior During Therapy: WFL for tasks assessed/performed Overall Cognitive Status: Within Functional Limits for tasks assessed  General Comments      Exercises     Assessment/Plan    PT Assessment Patient needs continued PT services  PT Problem List Decreased strength;Decreased balance;Decreased mobility;Decreased coordination       PT Treatment Interventions DME instruction;Gait training;Stair training;Functional mobility training;Therapeutic activities;Therapeutic exercise;Balance  training;Neuromuscular re-education;Patient/family education    PT Goals (Current goals can be found in the Care Plan section)  Acute Rehab PT Goals Patient Stated Goal: to get back to exercising and being active PT Goal Formulation: With patient Time For Goal Achievement: 07/04/18 Potential to Achieve Goals: Good    Frequency Min 3X/week   Barriers to discharge        Co-evaluation               AM-PAC PT "6 Clicks" Daily Activity  Outcome Measure Difficulty turning over in bed (including adjusting bedclothes, sheets and blankets)?: None Difficulty moving from lying on back to sitting on the side of the bed? : None Difficulty sitting down on and standing up from a chair with arms (e.g., wheelchair, bedside commode, etc,.)?: None Help needed moving to and from a bed to chair (including a wheelchair)?: None Help needed walking in hospital room?: A Little Help needed climbing 3-5 steps with a railing? : A Little 6 Click Score: 22    End of Session Equipment Utilized During Treatment: Gait belt Activity Tolerance: Patient tolerated treatment well Patient left: in chair;with call bell/phone within reach Nurse Communication: Mobility status PT Visit Diagnosis: Other abnormalities of gait and mobility (R26.89);Muscle weakness (generalized) (M62.81)    Time: 9563-8756 PT Time Calculation (min) (ACUTE ONLY): 12 min   Charges:   PT Evaluation $PT Eval Low Complexity: 1 Low          Sherie Don, PT, DPT  Acute Rehabilitation Services Pager [phone] Office [place] 06/20/2018, 11:47 AM

## 2018-06-20 NOTE — Progress Notes (Signed)
[facility]/Trauma Progress Note      Assessment/Plan Factor V Leiden deficiency History of DVT Chronic kidney disease Hypertension  Recurrent SBO - had 2 BM's 09/17 - NGT output 1000cc in last 12 hours - continued abdominal pain, little flatus in last 24 hours, and NGT output 1000cc - will do another clamping trial today and if she does not improve will take to OR tomorrow  FEN: NGT  VTE: SCD's, heparin ID: none Foley: none Follow up: TBD  DISPO: pt got distended after clamping trial yesterday. Placed NGT back to suction. Abdominal film this am looks better. Will do another clamping trial and if pt does not open up then we will take her to the OR tomorrow    LOS: 2 days    Subjective: CC: abdominal pain  Pt states this episode was the most painful. She is tender this am but passing flatus. No issues overnight. No additional BM's.   Objective: Vital signs in last 24 hours: Temp:  [98.2 F (36.8 C)-99.3 F (37.4 C)] 99.3 F (37.4 C) (09/18 0424) Pulse Rate:  [84-87] 86 (09/18 0424) Resp:  [17-18] 18 (09/18 0424) BP: (135-146)/(57-83) 142/83 (09/18 0424) SpO2:  [91 %-94 %] 94 % (09/18 0424) Last BM Date: 06/18/18  Intake/Output from previous day: 09/17 0701 - 09/18 0700 In: 960 [P.O.:60; I.V.:900] Out: 1000 [Emesis/NG output:1000] Intake/Output this shift: No intake/output data recorded.  PE: Gen:  Alert, NAD, pleasant, cooperative HEENT: NGT in place, dark liquid in canister  Pulm:  Rate and effort normal Abd: Soft, mild distention, mild generalized TTP without guarding, +BS, no HSM Skin: no rashes noted, warm and dry  Anti-infectives: Anti-infectives (From admission, onward)   Start     Dose/Rate Route Frequency Ordered Stop   06/20/18 1300  cefoTEtan (CEFOTAN) 1 g in sodium chloride 0.9 % 100 mL IVPB     1 g 200 mL/hr over 30 Minutes Intravenous To ShortStay Surgical 06/19/18 1452 06/21/18 1300      Lab Results:  Recent Labs   06/19/18 0635 06/20/18 0541  WBC 4.7 5.0  HGB 13.1 12.6  HCT 41.7 39.9  PLT 125* 109*   BMET Recent Labs    06/19/18 0635 06/20/18 0541  NA 144 139  K 4.8 3.7  CL 103 103  CO2 30 27  GLUCOSE 103* 232*  BUN 35* 20  CREATININE 1.24* 0.86  CALCIUM 9.0 7.9*   PT/INR No results for input(s): LABPROT, INR in the last 72 hours. CMP     Component Value Date/Time   NA 139 06/20/2018 0541   K 3.7 06/20/2018 0541   CL 103 06/20/2018 0541   CO2 27 06/20/2018 0541   GLUCOSE 232 (H) 06/20/2018 0541   BUN 20 06/20/2018 0541   CREATININE 0.86 06/20/2018 0541   CALCIUM 7.9 (L) 06/20/2018 0541   PROT 6.4 (L) 06/18/2018 0510   ALBUMIN 3.3 (L) 06/18/2018 0510   AST 27 06/18/2018 0510   ALT 31 06/18/2018 0510   ALKPHOS 83 06/18/2018 0510   BILITOT 0.8 06/18/2018 0510   GFRNONAA 59 (L) 06/20/2018 0541   GFRAA >60 06/20/2018 0541   Lipase     Component Value Date/Time   LIPASE 60 (H) 06/18/2018 0108    Studies/Results: Dg Abd 1 View  Result Date: 06/19/2018 CLINICAL DATA:  84 year old female with small bowel obstruction EXAM: ABDOMEN - 1 VIEW COMPARISON:  Prior abdominal radiograph 06/18/2018 FINDINGS: The gastric tube has been advanced. The tip overlies the gastric body in the  proximal side hole overlies the gastric fundus. This is a good position. Persistent loops of air-filled and mildly dilated small bowel in the mid abdomen. No significant interval progression compared to yesterday. Gas and stool are again noted throughout the colon. Atherosclerotic calcifications noted in the abdominal aorta. No acute osseous abnormality. Levoconvex lumbar scoliosis with multilevel degenerative disc disease. IMPRESSION: 1. Well-positioned gastric tube. 2. Persistent at least partial small bowel obstruction. No significant interval change compared to yesterday's imaging. 3.  Aortic Atherosclerosis (ICD10-170.0) Electronically Signed   By: Jacqulynn Cadet M.D.   On: 06/19/2018 09:21   Dg Abd  Portable 1v-small Bowel Obstruction Protocol-initial, 8 Hr Delay  Result Date: 06/18/2018 CLINICAL DATA:  Small-bowel obstruction EXAM: PORTABLE ABDOMEN - 1 VIEW COMPARISON:  Abdominal radiograph of earlier today. FINDINGS: The nasogastric tube tip projects in the proximal gastric body with the tip at the level of the GE junction. There are loops of mildly distended gas-filled small bowel within the abdomen. There is contrast within the urinary bladder. There is moderate levocurvature centered at L3. IMPRESSION: Advancement of the nasogastric tube by 5-10 cm is recommended to assure that the proximal port remains below the GE junction. Fairly stable mild gaseous distention of small-bowel loops consistent with obstruction. Electronically Signed   By: David  Martinique M.D.   On: 06/18/2018 16:05      Kalman Drape , [facility] Surgery 06/20/2018, 7:38 AM  Pager: [phone] Mon-Wed, Friday 7:00am-4:30pm Thurs 7am-11:30am  Consults: [phone]

## 2018-06-20 NOTE — [place] Notes (Signed)
[facility] PROGRESS NOTE  [name] WNU:[id number] DOB: 1929/06/06 DOA: 06/18/2018  PCP: Carol Ada, MD  Brief History/Interval Summary: 84 y.o.femalewithhistory of hypertension, hyperlipidemia, chronic kidney disease stage 2 who has had recurrent bowel obstructions and has had previous surgery for bowel obstruction in 2014, was admitted for similar complaints in July 2019 and at that time was managed conservatively.  She was once again hospitalized for small bowel obstruction.  Reason for Visit: Small bowel obstruction  Consultants: General surgery  Procedures: None yet  Antibiotics: None  Subjective/Interval History: Patient feels that her abdomen is a little tense this morning.  Denies any abdominal pain nausea or vomiting.  Passing some gas from below.  No bowel movements yet.  ROS: Denies any headaches.  No shortness of breath  Objective:  Vital Signs  Vitals:   06/19/18 0541 06/19/18 1625 06/19/18 2157 06/20/18 0424  BP: (!) 120/52 (!) 146/58 (!) 135/57 (!) 142/83  Pulse: 84 84 87 86  Resp: 17 18 17 18   Temp: 98.1 F (36.7 C) 98.2 F (36.8 C) 98.8 F (37.1 C) 99.3 F (37.4 C)  TempSrc: Oral Oral Oral Oral  SpO2: 99% 94% 91% 94%  Weight:      Height:        Intake/Output Summary (Last 24 hours) at 06/20/2018 1151 Last data filed at 06/20/2018 0347 Gross per 24 hour  Intake 920 ml  Output 1000 ml  Net -80 ml   Filed Weights   06/18/18 0036  Weight: 56.7 kg    General appearance: alert, cooperative, appears stated age and no distress Head: Normocephalic, without obvious abnormality, atraumatic Resp: clear to auscultation bilaterally Cardio: regular rate and rhythm, S1, S2 normal, no murmur, click, rub or gallop GI: Abdomen is noted to be distended.  Bowel sounds hyperactive.  Abdomen is nontender. Extremities: extremities normal, atraumatic, no cyanosis or edema Pulses: 2+ and symmetric Skin: Skin color, texture, turgor normal. No  rashes or lesions Neurologic: Alert and oriented x3.  No obvious focal neurological deficits.  Lab Results:  Data Reviewed: I have personally reviewed following labs and imaging studies  CBC: Recent Labs  Lab 06/18/18 0108 06/18/18 0510 06/19/18 0635 06/20/18 0541  WBC 10.1 9.8 4.7 5.0  HGB 13.4 13.7 13.1 12.6  HCT 42.4 43.1 41.7 39.9  MCV 93.0 91.3 93.9 92.6  PLT 120* 125* 125* 109*    Basic Metabolic Panel: Recent Labs  Lab 06/18/18 0108 06/18/18 0510 06/19/18 0635 06/20/18 0541  NA 138 138 144 139  K 4.8 5.3* 4.8 3.7  CL 98 97* 103 103  CO2 28 31 30 27   GLUCOSE 151* 169* 103* 232*  BUN 37* 34* 35* 20  CREATININE 1.24* 1.17* 1.24* 0.86  CALCIUM 10.1 10.1 9.0 7.9*  MG  --   --  1.9  --     GFR: Estimated Creatinine Clearance: 38.1 mL/min (by C-G formula based on SCr of 0.86 mg/dL).  Liver Function Tests: Recent Labs  Lab 06/18/18 0510  AST 27  ALT 31  ALKPHOS 83  BILITOT 0.8  PROT 6.4*  ALBUMIN 3.3*    Recent Labs  Lab 06/18/18 0108  LIPASE 60*    Cardiac Enzymes: Recent Labs  Lab 06/18/18 0108  TROPONINI <0.03     Recent Results (from the past 240 hour(s))  Surgical pcr screen     Status: None   Collection Time: 06/19/18  6:38 PM  Result Value Ref Range Status   MRSA, PCR NEGATIVE NEGATIVE Final  Staphylococcus aureus NEGATIVE NEGATIVE Final    Comment: (NOTE) The Xpert SA Assay (FDA approved for NASAL specimens in patients 88 years of age and older), is one component of a comprehensive surveillance program. It is not intended to diagnose infection nor to guide or monitor treatment. Performed at [facility], [place] [address], [place], [place] [address]       Radiology Studies: Dg Abd 1 View  Result Date: 06/19/2018 CLINICAL DATA:  84 year old female with small bowel obstruction EXAM: ABDOMEN - 1 VIEW COMPARISON:  Prior abdominal radiograph 06/18/2018 FINDINGS: The gastric tube has been advanced. The tip overlies the  gastric body in the proximal side hole overlies the gastric fundus. This is a good position. Persistent loops of air-filled and mildly dilated small bowel in the mid abdomen. No significant interval progression compared to yesterday. Gas and stool are again noted throughout the colon. Atherosclerotic calcifications noted in the abdominal aorta. No acute osseous abnormality. Levoconvex lumbar scoliosis with multilevel degenerative disc disease. IMPRESSION: 1. Well-positioned gastric tube. 2. Persistent at least partial small bowel obstruction. No significant interval change compared to yesterday's imaging. 3.  Aortic Atherosclerosis (ICD10-170.0) Electronically Signed   By: Jacqulynn Cadet M.D.   On: 06/19/2018 09:21   Dg Abd Portable 1v  Result Date: 06/20/2018 CLINICAL DATA:  Small bowel obstruction. EXAM: PORTABLE ABDOMEN - 1 VIEW COMPARISON:  06/19/2018 FINDINGS: Nasogastric tube remains in place with tip in the mid stomach. Mild decrease in dilated small bowel loops seen in the lower abdomen pelvis. Small amount of residual oral contrast material seen in the left colon. IMPRESSION: Mild decrease in small bowel dilatation since prior exam. Electronically Signed   By: Earle Gell M.D.   On: 06/20/2018 10:19   Dg Abd Portable 1v-small Bowel Obstruction Protocol-initial, 8 Hr Delay  Result Date: 06/18/2018 CLINICAL DATA:  Small-bowel obstruction EXAM: PORTABLE ABDOMEN - 1 VIEW COMPARISON:  Abdominal radiograph of earlier today. FINDINGS: The nasogastric tube tip projects in the proximal gastric body with the tip at the level of the GE junction. There are loops of mildly distended gas-filled small bowel within the abdomen. There is contrast within the urinary bladder. There is moderate levocurvature centered at L3. IMPRESSION: Advancement of the nasogastric tube by 5-10 cm is recommended to assure that the proximal port remains below the GE junction. Fairly stable mild gaseous distention of small-bowel  loops consistent with obstruction. Electronically Signed   By: David  Martinique M.D.   On: 06/18/2018 16:05     Medications:  Scheduled: . heparin injection (subcutaneous)  5,000 Units Subcutaneous Q8H   Continuous: . cefoTEtan (CEFOTAN) IV    . dextrose 5 % and 0.45% NaCl 75 mL/hr at 06/19/18 1548  . sodium chloride 500 mL/hr at 06/18/18 0400   LHT:[id number] **OR** acetaminophen, fentaNYL (SUBLIMAZE) injection, hydrALAZINE, ondansetron **OR** ondansetron (ZOFRAN) IV  Assessment/Plan:    Small bowel obstruction Management per general surgery.  NG tube has been clamped today.  Patient noted to be on IV fluids.  Essential hypertension Continue to monitor closely.  Blood pressure appears to be reasonably well controlled.  Acute renal failure on chronic kidney disease stage II This is in the setting of a small bowel obstruction and hypovolemia.  Renal function has improved.  Continue hydration for now.  Monitor urine output.  Hyperkalemia Resolved  Hyperlipidemia Resume statin when able to take oral  Previous history of DVT Not on anticoagulation at home.  Continue with DVT prophylaxis.   DVT Prophylaxis: Subcutaneous  heparin    Code Status: Full code Family Communication: Discussed with the patient Disposition Plan: Management as outlined above.    LOS: 2 days   [place] [facility] Pager [phone] 06/20/2018, 11:51 AM  If 7PM-7AM, please contact night-coverage at [email or web address], password [facility]

## 2018-06-21 ENCOUNTER — Inpatient Hospital Stay (HOSPITAL_COMMUNITY): Payer: PPO

## 2018-06-21 LAB — CBC
HEMATOCRIT: 36.7 % (ref 36.0–46.0)
HEMOGLOBIN: 11.6 g/dL — AB (ref 12.0–15.0)
MCH: 29 pg (ref 26.0–34.0)
MCHC: 31.6 g/dL (ref 30.0–36.0)
MCV: 91.8 fL (ref 78.0–100.0)
Platelets: 108 10*3/uL — ABNORMAL LOW (ref 150–400)
RBC: 4 MIL/uL (ref 3.87–5.11)
RDW: 14.6 % (ref 11.5–15.5)
WBC: 5.9 10*3/uL (ref 4.0–10.5)

## 2018-06-21 LAB — BASIC METABOLIC PANEL
Anion gap: 7 (ref 5–15)
BUN: 11 mg/dL (ref 8–23)
CO2: 29 mmol/L (ref 22–32)
CREATININE: 0.81 mg/dL (ref 0.44–1.00)
Calcium: 8 mg/dL — ABNORMAL LOW (ref 8.9–10.3)
Chloride: 100 mmol/L (ref 98–111)
GFR calc non Af Amer: >60 mL/min (ref >60)
GLUCOSE: 140 mg/dL — AB (ref 70–99)
Potassium: 3.3 mmol/L — ABNORMAL LOW (ref 3.5–5.1)
Sodium: 136 mmol/L (ref 135–145)

## 2018-06-21 MED ORDER — POTASSIUM CHLORIDE CRYS ER 20 MEQ PO TBCR
40.0000 meq | EXTENDED_RELEASE_TABLET | Freq: Once | ORAL | Status: AC
  Administered 2018-06-21: 40 meq via ORAL
  Filled 2018-06-21: qty 2

## 2018-06-21 NOTE — [place] Notes (Signed)
[facility]/Trauma Progress Note      Assessment/Plan Factor V Leiden deficiency History of DVT Chronic kidney disease Hypertension  Recurrent SBO - had 2 BM's 09/17 - had another BM 09/18 - tolerated NGT clamping - mild abdominal pain, + flatus  CZY:SAYTKZ  VTE: SCD's,heparin SW:FUXN Foley:none Follow up:TBD  DISPO:pull NGT, clears. We will follow              LOS: 3 days    Subjective: CC: SBO  Pt has had tube clamped since yesterday. She had a BM last evening. No nausea or vomiting. She has had some sips of clears. She is having flatus. She is still having a slight amount of abdominal discomfort and bloating. Daughter in law at bedside.   Objective: Vital signs in last 24 hours: Temp:  [98.4 F (36.9 C)-98.8 F (37.1 C)] 98.8 F (37.1 C) (09/19 0551) Pulse Rate:  [73-91] 73 (09/19 0551) Resp:  [16] 16 (09/19 0551) BP: (152-161)/(65-76) 152/65 (09/19 0551) SpO2:  [85 %-95 %] 95 % (09/19 0551) Last BM Date: 06/20/18(Later at night)  Intake/Output from previous day: 09/18 0701 - 09/19 0700 In: 1678.3 [P.O.:20; I.V.:1658.3] Out: 400 [Urine:400] Intake/Output this shift: No intake/output data recorded.  PE: Gen: Alert, NAD, pleasant, cooperative HEENT: NGT in place Pulm:Rate andeffort normal Abd: Soft,mild distention,very mild generalized TTP without guarding, +BS, no HSM Skin: no rashes noted, warm and dry  Anti-infectives: Anti-infectives (From admission, onward)   Start     Dose/Rate Route Frequency Ordered Stop   06/20/18 1300  cefoTEtan (CEFOTAN) 1 g in sodium chloride 0.9 % 100 mL IVPB     1 g 200 mL/hr over 30 Minutes Intravenous To [facility] 06/19/18 1452 06/21/18 1300      Lab Results:  Recent Labs    06/20/18 0541 06/21/18 0407  WBC 5.0 5.9  HGB 12.6 11.6*  HCT 39.9 36.7  PLT 109* 108*   BMET Recent Labs    06/20/18 0541 06/21/18 0407  NA 139 136  K 3.7 3.3*  CL 103 100  CO2 27 29   GLUCOSE 232* 140*  BUN 20 11  CREATININE 0.86 0.81  CALCIUM 7.9* 8.0*   PT/INR No results for input(s): LABPROT, INR in the last 72 hours. CMP     Component Value Date/Time   NA 136 06/21/2018 0407   K 3.3 (L) 06/21/2018 0407   CL 100 06/21/2018 0407   CO2 29 06/21/2018 0407   GLUCOSE 140 (H) 06/21/2018 0407   BUN 11 06/21/2018 0407   CREATININE 0.81 06/21/2018 0407   CALCIUM 8.0 (L) 06/21/2018 0407   PROT 6.4 (L) 06/18/2018 0510   ALBUMIN 3.3 (L) 06/18/2018 0510   AST 27 06/18/2018 0510   ALT 31 06/18/2018 0510   ALKPHOS 83 06/18/2018 0510   BILITOT 0.8 06/18/2018 0510   GFRNONAA >60 06/21/2018 0407   GFRAA >60 06/21/2018 0407   Lipase     Component Value Date/Time   LIPASE 60 (H) 06/18/2018 0108    Studies/Results: Dg Abd Portable 1v  Result Date: 06/21/2018 CLINICAL DATA:  Small-bowel obstruction. EXAM: PORTABLE ABDOMEN - 1 VIEW COMPARISON:  06/20/2018. FINDINGS: NG tube noted with tip and side hole below left hemidiaphragm. Small-bowel distention has improved. Air and contrast noted in the colon. No free air. IMPRESSION: NG tube in stable position. Small-bowel distention is improved. Air and contrast in the colon. No free air. Electronically Signed   By: Marcello Moores  Register   On: 06/21/2018 09:32  Dg Abd Portable 1v  Result Date: 06/20/2018 CLINICAL DATA:  Small bowel obstruction. EXAM: PORTABLE ABDOMEN - 1 VIEW COMPARISON:  06/19/2018 FINDINGS: Nasogastric tube remains in place with tip in the mid stomach. Mild decrease in dilated small bowel loops seen in the lower abdomen pelvis. Small amount of residual oral contrast material seen in the left colon. IMPRESSION: Mild decrease in small bowel dilatation since prior exam. Electronically Signed   By: Earle Gell M.D.   On: 06/20/2018 10:19      Kalman Drape , [facility] 06/21/2018, 10:59 AM  Pager: [phone] Mon-Wed, Friday 7:00am-4:30pm Thurs 7am-11:30am  Consults: [phone]

## 2018-06-21 NOTE — Plan of Care (Signed)
  Problem: Pain Managment: Goal: General experience of comfort will improve Outcome: Progressing   Problem: Safety: Goal: Ability to remain free from injury will improve Outcome: Progressing   Problem: Skin Integrity: Goal: Risk for impaired skin integrity will decrease Outcome: Progressing   

## 2018-06-21 NOTE — [place] Notes (Signed)
[facility] PROGRESS NOTE  [name] [id number]:[id number] DOB: 03-02-1929 DOA: 06/18/2018  PCP: Carol Ada, MD  Brief History/Interval Summary: 84 y.o.femalewithhistory of hypertension, hyperlipidemia, chronic kidney disease stage 2 who has had recurrent bowel obstructions and has had previous surgery for bowel obstruction in 2014, was admitted for similar complaints in July 2019 and at that time was managed conservatively.  She was once again hospitalized for small bowel obstruction.  Reason for Visit: Small bowel obstruction  Consultants: General surgery  Procedures: None yet  Antibiotics: None  Subjective/Interval History: Patient states that she is feeling better.  She was wondering when the tube can be pulled out.  She had a lot of bowel movements yesterday.  Passing gas.  Denies any nausea or vomiting.    ROS: Denies any shortness of breath  Objective:  Vital Signs  Vitals:   06/20/18 0424 06/20/18 1653 06/20/18 2100 06/21/18 0551  BP: (!) 142/83 (!) 154/67 (!) 161/76 (!) 152/65  Pulse: 86 85 91 73  Resp: 18 16 16 16   Temp: 99.3 F (37.4 C) 98.4 F (36.9 C) 98.5 F (36.9 C) 98.8 F (37.1 C)  TempSrc: Oral Oral Oral Oral  SpO2: 94% (!) 85% 94% 95%  Weight:      Height:        Intake/Output Summary (Last 24 hours) at 06/21/2018 0904 Last data filed at 06/21/2018 0500 Gross per 24 hour  Intake 1678.32 ml  Output 400 ml  Net 1278.32 ml   Filed Weights   06/18/18 0036  Weight: 56.7 kg    General appearance: Awake alert.  In no distress Resp: Normal effort at rest.  Clear to auscultation bilaterally Cardio: S1-S2 is normal regular.  No S3-S4.  No rubs murmurs or bruit GI: Abdomen mildly distended but less so than yesterday.  Soft to palpation.  Nontender.  Bowel sounds are present.  No obvious masses organomegaly Extremities: No edema Neurologic: Alert and oriented x3.  No obvious focal neurological deficits.  Lab Results:  Data Reviewed:  I have personally reviewed following labs and imaging studies  CBC: Recent Labs  Lab 06/18/18 0108 06/18/18 0510 06/19/18 0635 06/20/18 0541 06/21/18 0407  WBC 10.1 9.8 4.7 5.0 5.9  HGB 13.4 13.7 13.1 12.6 11.6*  HCT 42.4 43.1 41.7 39.9 36.7  MCV 93.0 91.3 93.9 92.6 91.8  PLT 120* 125* 125* 109* 108*    Basic Metabolic Panel: Recent Labs  Lab 06/18/18 0108 06/18/18 0510 06/19/18 0635 06/20/18 0541 06/21/18 0407  NA 138 138 144 139 136  K 4.8 5.3* 4.8 3.7 3.3*  CL 98 97* 103 103 100  CO2 28 31 30 27 29   GLUCOSE 151* 169* 103* 232* 140*  BUN 37* 34* 35* 20 11  CREATININE 1.24* 1.17* 1.24* 0.86 0.81  CALCIUM 10.1 10.1 9.0 7.9* 8.0*  MG  --   --  1.9  --   --     GFR: Estimated Creatinine Clearance: 40.5 mL/min (by C-G formula based on SCr of 0.81 mg/dL).  Liver Function Tests: Recent Labs  Lab 06/18/18 0510  AST 27  ALT 31  ALKPHOS 83  BILITOT 0.8  PROT 6.4*  ALBUMIN 3.3*    Recent Labs  Lab 06/18/18 0108  LIPASE 60*    Cardiac Enzymes: Recent Labs  Lab 06/18/18 0108  TROPONINI <0.03     Recent Results (from the past 240 hour(s))  Surgical pcr screen     Status: None   Collection Time: 06/19/18  6:38 PM  Result Value Ref Range Status   MRSA, PCR NEGATIVE NEGATIVE Final   Staphylococcus aureus NEGATIVE NEGATIVE Final    Comment: (NOTE) The Xpert SA Assay (FDA approved for NASAL specimens in patients 3 years of age and older), is one component of a comprehensive surveillance program. It is not intended to diagnose infection nor to guide or monitor treatment. Performed at [facility], [place] [address], [place], [place] [address]       Radiology Studies: Dg Abd Portable 1v  Result Date: 06/20/2018 CLINICAL DATA:  Small bowel obstruction. EXAM: PORTABLE ABDOMEN - 1 VIEW COMPARISON:  06/19/2018 FINDINGS: Nasogastric tube remains in place with tip in the mid stomach. Mild decrease in dilated small bowel loops seen in the lower abdomen  pelvis. Small amount of residual oral contrast material seen in the left colon. IMPRESSION: Mild decrease in small bowel dilatation since prior exam. Electronically Signed   By: Earle Gell M.D.   On: 06/20/2018 10:19     Medications:  Scheduled: . heparin injection (subcutaneous)  5,000 Units Subcutaneous Q8H   Continuous: . cefoTEtan (CEFOTAN) IV    . dextrose 5 % and 0.45% NaCl 75 mL/hr at 06/20/18 1833  . sodium chloride 500 mL/hr at 06/18/18 0400   YIF:OYDXAJOINOMVE **OR** acetaminophen, fentaNYL (SUBLIMAZE) injection, hydrALAZINE, ondansetron **OR** ondansetron (ZOFRAN) IV  Assessment/Plan:    Small bowel obstruction Management per general surgery.  Seems to be improving as patient had multiple bowel movements yesterday and is passing gas.  NG tube remains clamped.  Patient noted to be on IV fluids.    Essential hypertension Blood pressure noted to be somewhat elevated.  But does not need any active intervention currently.  Once she is able to take orally we will resume her home medications.  Continue to monitor for now.    Acute renal failure on chronic kidney disease stage II This was in the setting of small bowel obstruction and hypovolemia.  With IV hydration renal function has improved.  Replace potassium today.    Hyperkalemia Potassium was 5.3 on 9/16.  Subsequently improved.  Today she is noted to have hypokalemia.  She will be given a dose of potassium.  Hyperlipidemia Resume statin when able to take oral  Previous history of DVT Not on anticoagulation at home.  Continue with DVT prophylaxis.   DVT Prophylaxis: Subcutaneous heparin    Code Status: Full code Family Communication: Discussed with the patient and her son Disposition Plan: Continue to mobilize.  Management as outlined above.    LOS: 3 days   [place] [facility] Pager [phone] 06/21/2018, 9:04 AM  If 7PM-7AM, please contact night-coverage at [email or web address], password  [facility]

## 2018-06-21 NOTE — [place] Notes (Signed)
Physical Therapy Treatment Patient Details Name: [name] MRN: [id number] DOB: Jun 11, 1929 Today's Date: 06/21/2018    History of Present Illness Pt is an 84 y/o female admitted secondary to recurrent SBO. PMH including but not limited to CKD, HTN and hx of ex lap for SBO in 2014.    PT Comments    Pt making great progress with functional mobility and tolerated general LE strengthening exercises this session. Gait deferred this time as pt reported that she had already ambulated unit multiple times today. PT will continue to follow acutely.    Follow Up Recommendations  No PT follow up;Supervision - Intermittent     Equipment Recommendations  None recommended by PT    Recommendations for Other Services       Precautions / Restrictions Precautions Precautions: None Restrictions Weight Bearing Restrictions: No    Mobility  Bed Mobility               General bed mobility comments: pt in bathroom with RN student upon arrival  Transfers Overall transfer level: Needs assistance Equipment used: None Transfers: Sit to/from Stand Sit to Stand: Supervision         General transfer comment: supervision for safety  Ambulation/Gait Ambulation/Gait assistance: Supervision Gait Distance (Feet): 20 Feet Assistive device: IV Pole Gait Pattern/deviations: Step-through pattern;Decreased stride length Gait velocity: decreased   General Gait Details: focus of session was on strengthening therex as pt has ambulated the whole unit multiple times today   Stairs             Wheelchair Mobility    Modified Rankin (Stroke Patients Only)       Balance Overall balance assessment: Needs assistance Sitting-balance support: Feet supported Sitting balance-Leahy Scale: Good     Standing balance support: No upper extremity supported Standing balance-Leahy Scale: Good                              Cognition Arousal/Alertness: Awake/alert Behavior  During Therapy: WFL for tasks assessed/performed Overall Cognitive Status: Within Functional Limits for tasks assessed                                        Exercises Total Joint Exercises Knee Flexion: Standing;AROM;Strengthening;Both;10 reps General Exercises - Lower Extremity Long Arc Quad: AROM;Strengthening;Both;10 reps;Seated Hip ABduction/ADduction: Standing;AROM;Strengthening;Both;10 reps Hip Flexion/Marching: Standing;AROM;Strengthening;Both;10 reps Mini-Sqauts: AROM;Strengthening;10 reps    General Comments        Pertinent Vitals/Pain Pain Assessment: No/denies pain    Home Living                      Prior Function            PT Goals (current goals can now be found in the care plan section) Acute Rehab PT Goals PT Goal Formulation: With patient Time For Goal Achievement: 07/04/18 Potential to Achieve Goals: Good Progress towards PT goals: Progressing toward goals    Frequency    Min 3X/week      PT Plan Current plan remains appropriate    Co-evaluation              AM-PAC PT "6 Clicks" Daily Activity  Outcome Measure  Difficulty turning over in bed (including adjusting bedclothes, sheets and blankets)?: None Difficulty moving from lying on back to sitting on the side of the bed? :  None Difficulty sitting down on and standing up from a chair with arms (e.g., wheelchair, bedside commode, etc,.)?: None Help needed moving to and from a bed to chair (including a wheelchair)?: None Help needed walking in hospital room?: None Help needed climbing 3-5 steps with a railing? : A Little 6 Click Score: 23    End of Session   Activity Tolerance: Patient tolerated treatment well Patient left: in chair;with call bell/phone within reach Nurse Communication: Mobility status PT Visit Diagnosis: Other abnormalities of gait and mobility (R26.89);Muscle weakness (generalized) (M62.81)     Time: 3818-4037 PT Time Calculation  (min) (ACUTE ONLY): 10 min  Charges:  $Therapeutic Exercise: 8-22 mins                     Sherie Don, [place], DPT  Acute Rehabilitation Services Pager [phone] Office [place] 06/21/2018, 1:18 PM

## 2018-06-22 LAB — BASIC METABOLIC PANEL
Anion gap: 4 — ABNORMAL LOW (ref 5–15)
BUN: 5 mg/dL — ABNORMAL LOW (ref 8–23)
CO2: 32 mmol/L (ref 22–32)
CREATININE: 0.93 mg/dL (ref 0.44–1.00)
Calcium: 8.2 mg/dL — ABNORMAL LOW (ref 8.9–10.3)
Chloride: 104 mmol/L (ref 98–111)
GFR calc Af Amer: >60 mL/min (ref >60)
GFR, EST NON AFRICAN AMERICAN: 54 mL/min — AB (ref >60)
GLUCOSE: 123 mg/dL — AB (ref 70–99)
Potassium: 4.7 mmol/L (ref 3.5–5.1)
Sodium: 140 mmol/L (ref 135–145)

## 2018-06-22 NOTE — Progress Notes (Signed)
Physical Therapy Treatment Patient Details Name: [name] MRN: [id number] DOB: 1929-07-13 Today's Date: 06/22/2018    History of Present Illness Pt is an 84 y/o female admitted secondary to recurrent SBO. PMH including but not limited to CKD, HTN and hx of ex lap for SBO in 2014.    PT Comments    Patient progressing well towards PT goals. Tolerated stair training with supervision for safety. Pt with mild balance deficits with balance challenges resulting in deviations in gait path and staggering without any overt LOB. Pt independent PTA and lives alone working out 3 times/week and does report some falls at home. Recommend follow up OPPT for higher level balance so pt can minimize fall risk and maintain independence. Will follow.    Follow Up Recommendations  Outpatient PT;Supervision - Intermittent     Equipment Recommendations  None recommended by PT    Recommendations for Other Services       Precautions / Restrictions Precautions Precautions: None Restrictions Weight Bearing Restrictions: No    Mobility  Bed Mobility               General bed mobility comments: Up in chair upon PT arrival.   Transfers Overall transfer level: Needs assistance Equipment used: None Transfers: Sit to/from Stand Sit to Stand: Supervision         General transfer comment: supervision for safety; stood from chair x1, from toilet x1.   Ambulation/Gait Ambulation/Gait assistance: Supervision Gait Distance (Feet): 500 Feet Assistive device: None Gait Pattern/deviations: Step-through pattern;Decreased stride length;Drifts right/left;Staggering right;Staggering left Gait velocity: fast- cues to decrease speed for safety   General Gait Details: Slow, mostly steady gait without IV pole to mimic home; some staggering noted with head turns but no overt LOB.   Stairs Stairs: Yes Stairs assistance: Supervision Stair Management: One rail Right;Step to pattern Number of Stairs:  4 General stair comments: Cues for safety.   Wheelchair Mobility    Modified Rankin (Stroke Patients Only)       Balance Overall balance assessment: Needs assistance Sitting-balance support: Feet supported;No upper extremity supported Sitting balance-Leahy Scale: Good     Standing balance support: During functional activity Standing balance-Leahy Scale: Good               High level balance activites: Head turns;Turns;Direction changes High Level Balance Comments: Tolerated above with mild deviations in gait- staggering to right/left esp with head turns.            Cognition Arousal/Alertness: Awake/alert Behavior During Therapy: WFL for tasks assessed/performed Overall Cognitive Status: Within Functional Limits for tasks assessed                                        Exercises      General Comments General comments (skin integrity, edema, etc.): Son and daughter in law present      Pertinent Vitals/Pain Pain Assessment: No/denies pain    Home Living                      Prior Function            PT Goals (current goals can now be found in the care plan section) Progress towards PT goals: Progressing toward goals    Frequency    Min 3X/week      PT Plan Discharge plan needs to be updated  Co-evaluation              AM-PAC PT "6 Clicks" Daily Activity  Outcome Measure  Difficulty turning over in bed (including adjusting bedclothes, sheets and blankets)?: None Difficulty moving from lying on back to sitting on the side of the bed? : None Difficulty sitting down on and standing up from a chair with arms (e.g., wheelchair, bedside commode, etc,.)?: None Help needed moving to and from a bed to chair (including a wheelchair)?: None Help needed walking in hospital room?: None Help needed climbing 3-5 steps with a railing? : A Little 6 Click Score: 23    End of Session Equipment Utilized During Treatment:  Gait belt Activity Tolerance: Patient tolerated treatment well Patient left: in chair;with call bell/phone within reach;with family/visitor present Nurse Communication: Mobility status PT Visit Diagnosis: Other abnormalities of gait and mobility (R26.89);Muscle weakness (generalized) (M62.81)     Time: 1441-1500 PT Time Calculation (min) (ACUTE ONLY): 19 min  Charges:  $Gait Training: 8-22 mins                     Wray Kearns, PT, DPT Acute Rehabilitation Services Pager [phone] Office [place] 06/22/2018, 3:07 PM

## 2018-06-22 NOTE — Progress Notes (Signed)
[facility] PROGRESS NOTE  [name] RJJ:[id number] DOB: 1929/09/03 DOA: 06/18/2018  PCP: Carol Ada, MD  Brief History/Interval Summary: 84 y.o.femalewithhistory of hypertension, hyperlipidemia, chronic kidney disease stage 2 who has had recurrent bowel obstructions and has had previous surgery for bowel obstruction in 2014, was admitted for similar complaints in July 2019 and at that time was managed conservatively.  She was once again hospitalized for small bowel obstruction.  Reason for Visit: Small bowel obstruction  Consultants: General surgery  Procedures: None   Antibiotics: None  Subjective/Interval History: Patient states that she is feeling much better this morning.  Has tolerated her liquid diet without any difficulties.  Continues to pass gas.  Has had multiple bowel movements.    ROS: Denies any shortness of breath or chest pain.  Objective:  Vital Signs  Vitals:   06/21/18 0551 06/21/18 1236 06/21/18 2359 06/22/18 0409  BP: (!) 152/65 (!) 161/64 (!) 143/64 137/68  Pulse: 73  72 67  Resp: 16 16 17 20   Temp: 98.8 F (37.1 C) 98.2 F (36.8 C) 98.3 F (36.8 C) 97.6 F (36.4 C)  TempSrc: Oral Oral Oral Oral  SpO2: 95% 97% 91% 91%  Weight:      Height:        Intake/Output Summary (Last 24 hours) at 06/22/2018 0926 Last data filed at 06/22/2018 0321 Gross per 24 hour  Intake 862.19 ml  Output 100 ml  Net 762.19 ml   Filed Weights   06/18/18 0036  Weight: 56.7 kg    General appearance: Awake alert.  In no distress Resp: Normal effort at rest.  Clear to auscultation bilaterally Cardio: S1-S2 is normal regular.  No S3-S4.  No rubs murmurs or bruit GI: Abdomen is mildly distended.  Nontender.  Soft.  Bowel sounds present.  No masses organomegaly. Extremities: No pedal edema Neurologic: Alert and oriented x3.  No focal neurological deficits.  Lab Results:  Data Reviewed: I have personally reviewed following labs and imaging  studies  CBC: Recent Labs  Lab 06/18/18 0108 06/18/18 0510 06/19/18 0635 06/20/18 0541 06/21/18 0407  WBC 10.1 9.8 4.7 5.0 5.9  HGB 13.4 13.7 13.1 12.6 11.6*  HCT 42.4 43.1 41.7 39.9 36.7  MCV 93.0 91.3 93.9 92.6 91.8  PLT 120* 125* 125* 109* 108*    Basic Metabolic Panel: Recent Labs  Lab 06/18/18 0510 06/19/18 0635 06/20/18 0541 06/21/18 0407 06/22/18 0537  NA 138 144 139 136 140  K 5.3* 4.8 3.7 3.3* 4.7  CL 97* 103 103 100 104  CO2 31 30 27 29  32  GLUCOSE 169* 103* 232* 140* 123*  BUN 34* 35* 20 11 5*  CREATININE 1.17* 1.24* 0.86 0.81 0.93  CALCIUM 10.1 9.0 7.9* 8.0* 8.2*  MG  --  1.9  --   --   --     GFR: Estimated Creatinine Clearance: 35.3 mL/min (by C-G formula based on SCr of 0.93 mg/dL).  Liver Function Tests: Recent Labs  Lab 06/18/18 0510  AST 27  ALT 31  ALKPHOS 83  BILITOT 0.8  PROT 6.4*  ALBUMIN 3.3*    Recent Labs  Lab 06/18/18 0108  LIPASE 60*    Cardiac Enzymes: Recent Labs  Lab 06/18/18 0108  TROPONINI <0.03     Recent Results (from the past 240 hour(s))  Surgical pcr screen     Status: None   Collection Time: 06/19/18  6:38 PM  Result Value Ref Range Status   MRSA, PCR NEGATIVE NEGATIVE Final  Staphylococcus aureus NEGATIVE NEGATIVE Final    Comment: (NOTE) The Xpert SA Assay (FDA approved for NASAL specimens in patients 42 years of age and older), is one component of a comprehensive surveillance program. It is not intended to diagnose infection nor to guide or monitor treatment. Performed at [facility], [place] [address], [place], [place] [address]       Radiology Studies: Dg Abd Portable 1v  Result Date: 06/21/2018 CLINICAL DATA:  Small-bowel obstruction. EXAM: PORTABLE ABDOMEN - 1 VIEW COMPARISON:  06/20/2018. FINDINGS: NG tube noted with tip and side hole below left hemidiaphragm. Small-bowel distention has improved. Air and contrast noted in the colon. No free air. IMPRESSION: NG tube in stable  position. Small-bowel distention is improved. Air and contrast in the colon. No free air. Electronically Signed   By: Marcello Moores  Register   On: 06/21/2018 09:32     Medications:  Scheduled: . heparin injection (subcutaneous)  5,000 Units Subcutaneous Q8H   Continuous: . dextrose 5 % and 0.45% NaCl 75 mL/hr at 06/21/18 2059  . sodium chloride 500 mL/hr at 06/18/18 0400   VVZ:SMOLMBEMLJQGB **OR** acetaminophen, fentaNYL (SUBLIMAZE) injection, hydrALAZINE, ondansetron **OR** ondansetron (ZOFRAN) IV  Assessment/Plan:    Small bowel obstruction Patient seems to be improving.  NG tube was discontinued yesterday.  Diet was advanced which she is tolerating well.  Cut back on IV fluids.  Discussed with the general surgery PA.  Plan will be to advance to full liquids followed by soft diet and if she tolerates this well she could be discharged tomorrow.    Essential hypertension Blood pressure remains stable.  Continue to monitor.     Acute renal failure on chronic kidney disease stage II/hypokalemia This was in the setting of small bowel obstruction and hypovolemia.  With IV hydration renal function has improved.  Potassium is normal today.  Hyperlipidemia Resume statin when able to take oral  Previous history of DVT Not on anticoagulation at home.  Continue with DVT prophylaxis.   DVT Prophylaxis: Subcutaneous heparin    Code Status: Full code Family Communication: Discussed with the patient and her son Disposition Plan: Continue to mobilize.  Advance to soft diet later today.  Possible discharge tomorrow.    LOS: 4 days   [place] [facility] Pager [phone] 06/22/2018, 9:26 AM  If 7PM-7AM, please contact night-coverage at [email or web address], password [facility]

## 2018-06-22 NOTE — Progress Notes (Signed)
[facility]/Trauma Progress Note      Assessment/Plan Factor V Leiden deficiency History of DVT Chronic kidney disease Hypertension  Recurrent SBO - had 2 BM's09/17 - had another BM 09/18 - tolerated NGT clamping - no abdominal pain, + flatus, + BM's  FEN:FLD VTE: SCD's,heparin XN:ATFT Foley:none Follow up:TBD  DISPO:FLD and advance as tolerated. We will follow   LOS: 4 days    Subjective: CC: SBO  Pt states she feels more like herself today. She denies abdominal pain. She is tolerating clears. She is having flatus and had a BM yesterday. No nausea or vomiting.   Objective: Vital signs in last 24 hours: Temp:  [97.6 F (36.4 C)-98.3 F (36.8 C)] 97.6 F (36.4 C) (09/20 0409) Pulse Rate:  [67-72] 67 (09/20 0409) Resp:  [16-20] 20 (09/20 0409) BP: (137-161)/(64-68) 137/68 (09/20 0409) SpO2:  [91 %-97 %] 91 % (09/20 0409) Last BM Date: 06/21/18  Intake/Output from previous day: 09/19 0701 - 09/20 0700 In: 862.2 [P.O.:240; I.V.:622.2] Out: 100 [Urine:100] Intake/Output this shift: No intake/output data recorded.  PE: Gen: Alert, NAD, pleasant, cooperative Pulm:Rate andeffort normal Abd: Soft,mild distention,no TTP, no guarding, +BS, no HSM Skin: no rashes noted, warm and dry   Anti-infectives: Anti-infectives (From admission, onward)   Start     Dose/Rate Route Frequency Ordered Stop   06/20/18 1300  cefoTEtan (CEFOTAN) 1 g in sodium chloride 0.9 % 100 mL IVPB     1 g 200 mL/hr over 30 Minutes Intravenous To [facility] 06/19/18 1452 06/21/18 1300      Lab Results:  Recent Labs    06/20/18 0541 06/21/18 0407  WBC 5.0 5.9  HGB 12.6 11.6*  HCT 39.9 36.7  PLT 109* 108*   BMET Recent Labs    06/21/18 0407 06/22/18 0537  NA 136 140  K 3.3* 4.7  CL 100 104  CO2 29 32  GLUCOSE 140* 123*  BUN 11 5*  CREATININE 0.81 0.93  CALCIUM 8.0* 8.2*   PT/INR No results for input(s): LABPROT, INR in  the last 72 hours. CMP     Component Value Date/Time   NA 140 06/22/2018 0537   K 4.7 06/22/2018 0537   CL 104 06/22/2018 0537   CO2 32 06/22/2018 0537   GLUCOSE 123 (H) 06/22/2018 0537   BUN 5 (L) 06/22/2018 0537   CREATININE 0.93 06/22/2018 0537   CALCIUM 8.2 (L) 06/22/2018 0537   PROT 6.4 (L) 06/18/2018 0510   ALBUMIN 3.3 (L) 06/18/2018 0510   AST 27 06/18/2018 0510   ALT 31 06/18/2018 0510   ALKPHOS 83 06/18/2018 0510   BILITOT 0.8 06/18/2018 0510   GFRNONAA 54 (L) 06/22/2018 0537   GFRAA >60 06/22/2018 0537   Lipase     Component Value Date/Time   LIPASE 60 (H) 06/18/2018 0108    Studies/Results: Dg Abd Portable 1v  Result Date: 06/21/2018 CLINICAL DATA:  Small-bowel obstruction. EXAM: PORTABLE ABDOMEN - 1 VIEW COMPARISON:  06/20/2018. FINDINGS: NG tube noted with tip and side hole below left hemidiaphragm. Small-bowel distention has improved. Air and contrast noted in the colon. No free air. IMPRESSION: NG tube in stable position. Small-bowel distention is improved. Air and contrast in the colon. No free air. Electronically Signed   By: Marcello Moores  Register   On: 06/21/2018 09:32      Kalman Drape , [facility] 06/22/2018, 8:54 AM  Pager: [phone] Mon-Wed, Friday 7:00am-4:30pm Thurs 7am-11:30am  Consults: [phone]

## 2018-06-22 NOTE — Plan of Care (Signed)
  Problem: Health Behavior/Discharge Planning: Goal: Ability to manage health-related needs will improve Outcome: Progressing   Problem: Clinical Measurements: Goal: Ability to maintain clinical measurements within normal limits will improve Outcome: Progressing Goal: Will remain free from infection Outcome: Progressing Goal: Diagnostic test results will improve Outcome: Progressing Goal: Respiratory complications will improve Outcome: Progressing Goal: Cardiovascular complication will be avoided Outcome: Progressing   Problem: Coping: Goal: Level of anxiety will decrease Outcome: Progressing   Problem: Elimination: Goal: Will not experience complications related to bowel motility Outcome: Progressing   Problem: Safety: Goal: Ability to remain free from injury will improve Outcome: Progressing

## 2018-06-23 LAB — BASIC METABOLIC PANEL
Anion gap: 6 (ref 5–15)
BUN: 9 mg/dL (ref 8–23)
CO2: 31 mmol/L (ref 22–32)
CREATININE: 0.94 mg/dL (ref 0.44–1.00)
Calcium: 8.6 mg/dL — ABNORMAL LOW (ref 8.9–10.3)
Chloride: 102 mmol/L (ref 98–111)
GFR calc Af Amer: >60 mL/min (ref >60)
GFR, EST NON AFRICAN AMERICAN: 53 mL/min — AB (ref >60)
GLUCOSE: 103 mg/dL — AB (ref 70–99)
Potassium: 4.3 mmol/L (ref 3.5–5.1)
Sodium: 139 mmol/L (ref 135–145)

## 2018-06-23 MED ORDER — SENNA 8.6 MG PO TABS: 1.0000 | ORAL_TABLET | Freq: Every day | ORAL | 0 refills | Status: AC

## 2018-06-23 NOTE — Progress Notes (Signed)
   Subjective/Chief Complaint: No complaints   Objective: Vital signs in last 24 hours: Temp:  [97.7 F (36.5 C)-98.2 F (36.8 C)] 97.7 F (36.5 C) (09/21 0548) Pulse Rate:  [57-64] 59 (09/21 0548) Resp:  [17-18] 18 (09/21 0548) BP: (139-151)/(61-69) 151/69 (09/21 0548) SpO2:  [93 %-98 %] 93 % (09/21 0548) Last BM Date: 06/21/18  Intake/Output from previous day: 09/20 0701 - 09/21 0700 In: 1807.3 [P.O.:960; I.V.:847.3] Out: 600 [Urine:600] Intake/Output this shift: No intake/output data recorded.  General appearance: alert and cooperative Resp: clear to auscultation bilaterally Cardio: regular rate and rhythm GI: soft, nontender. good bs. having bm  Lab Results:  Recent Labs    06/21/18 0407  WBC 5.9  HGB 11.6*  HCT 36.7  PLT 108*   BMET Recent Labs    06/21/18 0407 06/22/18 0537  NA 136 140  K 3.3* 4.7  CL 100 104  CO2 29 32  GLUCOSE 140* 123*  BUN 11 5*  CREATININE 0.81 0.93  CALCIUM 8.0* 8.2*   PT/INR No results for input(s): LABPROT, INR in the last 72 hours. ABG No results for input(s): PHART, HCO3 in the last 72 hours.  Invalid input(s): PCO2, PO2  Studies/Results: No results found.  Anti-infectives: Anti-infectives (From admission, onward)   Start     Dose/Rate Route Frequency Ordered Stop   06/20/18 1300  cefoTEtan (CEFOTAN) 1 g in sodium chloride 0.9 % 100 mL IVPB     1 g 200 mL/hr over 30 Minutes Intravenous To [facility] 06/19/18 1452 06/21/18 1300      Assessment/Plan: s/p Procedure(s): EXPLORATORY LAPAROTOMY (N/A) LYSIS OF ADHESION (N/A) Advance diet Discharge. sbo seems to have resolved. [place] for d/c from surgical [facility]. Will sign off  LOS: 5 days    TOTH III,PAUL S 06/23/2018

## 2018-06-23 NOTE — Progress Notes (Signed)
Discharged home today. Personal belongings, discharged instructions , prescription given to patient. Verbalized understanding of the instructions

## 2018-06-23 NOTE — Discharge Instructions (Signed)
Small Bowel Obstruction °A small bowel obstruction means that something is blocking the small bowel. The small bowel is also called the small intestine. It is the long tube that connects the stomach to the colon. An obstruction will stop food and fluids from passing through the small bowel. Treatment depends on what is causing the problem and how bad the problem is. °Follow these instructions at home: °· Get a lot of rest. °· Follow your diet as told by your doctor. You may need to: °? Only drink clear liquids until you start to get better. °? Avoid solid foods as told by your doctor. °· Take over-the-counter and prescription medicines only as told by your doctor. °· Keep all follow-up visits as told by your doctor. This is important. °Contact a doctor if: °· You have a fever. °· You have chills. °Get help right away if: °· You have pain or cramps that get worse. °· You throw up (vomit) blood. °· You have a feeling of being sick to your stomach (nausea) that does not go away. °· You cannot stop throwing up. °· You cannot drink fluids. °· You feel confused. °· You feel dry or thirsty (dehydrated). °· Your belly gets more bloated. °· You feel weak or you pass out (faint). °This information is not intended to replace advice given to you by your health care provider. Make sure you discuss any questions you have with your health care provider. °Document Released: 10/27/2004 Document Revised: 05/16/2016 Document Reviewed: 11/13/2014 °Elsevier Interactive Patient Education © 2018 [employer] ° °

## 2018-06-23 NOTE — Discharge Summary (Signed)
[facility]  Physician Discharge Summary   Patient ID: [name] MRN: [id number] DOB/AGE: [birth year] 84 y.o.  Admit date: 06/18/2018 Discharge date: 06/23/2018  PCP: Carol Ada, MD  DISCHARGE DIAGNOSES:  Small bowel obstruction, resolved Essential hypertension Acute renal failure on chronic kidney disease stage II, resolved Hyperlipidemia   RECOMMENDATIONS FOR OUTPATIENT FOLLOW UP: 1. Outpatient follow-up with PCP   DISCHARGE CONDITION: fair  Diet recommendation: Soft diet for 5 days  Filed Weights   06/18/18 0036  Weight: 56.7 kg    INITIAL HISTORY: 84 y.o.femalewithhistory of hypertension, hyperlipidemia, chronic kidney disease stage 2 who has had recurrent bowel obstructions and has had previous surgery for bowel obstruction in 2014, was admitted for similar complaints in July 2019 and at that time was managed conservatively.  She was once again hospitalized for small bowel obstruction.  Consultations:  General surgery  Procedures: None   HOSPITAL COURSE:   Small bowel obstruction Patient was admitted to the hospital.  NG tube was placed.  Patient was seen by general surgery.  Conservative management.  Patient slowly started improving.  NG tube was clamped to begin with.  Subsequently it was removed.  She tolerated soft diet.  She has been ambulating.  She has had bowel movements.  She feels much better.  Okay for discharge today per general surgery.  Essential hypertension Continue home medications.    Recently changed over to olmesartan by PCP.  Acute renal failure on chronic kidney disease stage II/hypokalemia This was in the setting of small bowel obstruction and hypovolemia.  With IV hydration renal function has improved.   Electrolytes are normal.  Hyperlipidemia Resume home medications.  Previous history of DVT Not on anticoagulation at home.  Overall stable.  Okay for discharge home today.    PERTINENT LABS:  The  results of significant diagnostics from this hospitalization (including imaging, microbiology, ancillary and laboratory) are listed below for reference.    Microbiology: Recent Results (from the past 240 hour(s))  Surgical pcr screen     Status: None   Collection Time: 06/19/18  6:38 PM  Result Value Ref Range Status   MRSA, PCR NEGATIVE NEGATIVE Final   Staphylococcus aureus NEGATIVE NEGATIVE Final    Comment: (NOTE) The Xpert SA Assay (FDA approved for NASAL specimens in patients 3 years of age and older), is one component of a comprehensive surveillance program. It is not intended to diagnose infection nor to guide or monitor treatment. Performed at [facility], [place] [address], [place], [place] [address]      Labs: Basic Metabolic Panel: Recent Labs  Lab 06/19/18 [phone] 06/20/18 0541 06/21/18 0407 06/22/18 0537 06/23/18 0507  NA 144 139 136 140 139  K 4.8 3.7 3.3* 4.7 4.3  CL 103 103 100 104 102  CO2 30 27 29  32 31  GLUCOSE 103* 232* 140* 123* 103*  BUN 35* 20 11 5* 9  CREATININE 1.24* 0.86 0.81 0.93 0.94  CALCIUM 9.0 7.9* 8.0* 8.2* 8.6*  MG 1.9  --   --   --   --    Liver Function Tests: Recent Labs  Lab 06/18/18 0510  AST 27  ALT 31  ALKPHOS 83  BILITOT 0.8  PROT 6.4*  ALBUMIN 3.3*   Recent Labs  Lab 06/18/18 0108  LIPASE 60*   CBC: Recent Labs  Lab 06/18/18 0108 06/18/18 0510 06/19/18 0635 06/20/18 0541 06/21/18 0407  WBC 10.1 9.8 4.7 5.0 5.9  HGB 13.4 13.7 13.1 12.6 11.6*  HCT 42.4 43.1 41.7 39.9 36.7  MCV 93.0 91.3 93.9 92.6 91.8  PLT 120* 125* 125* 109* 108*   Cardiac Enzymes: Recent Labs  Lab 06/18/18 0108  TROPONINI <0.03     IMAGING STUDIES Dg Abd 1 View  Result Date: 06/19/2018 CLINICAL DATA:  84 year old female with small bowel obstruction EXAM: ABDOMEN - 1 VIEW COMPARISON:  Prior abdominal radiograph 06/18/2018 FINDINGS: The gastric tube has been advanced. The tip overlies the gastric body in the proximal side hole  overlies the gastric fundus. This is a good position. Persistent loops of air-filled and mildly dilated small bowel in the mid abdomen. No significant interval progression compared to yesterday. Gas and stool are again noted throughout the colon. Atherosclerotic calcifications noted in the abdominal aorta. No acute osseous abnormality. Levoconvex lumbar scoliosis with multilevel degenerative disc disease. IMPRESSION: 1. Well-positioned gastric tube. 2. Persistent at least partial small bowel obstruction. No significant interval change compared to yesterday's imaging. 3.  Aortic Atherosclerosis (ICD10-170.0) Electronically Signed   By: Jacqulynn Cadet M.D.   On: 06/19/2018 09:21   Ct Abdomen Pelvis W Contrast  Result Date: 06/18/2018 CLINICAL DATA:  Bowel obstruction. History of hysterectomy, appendectomy, exploratory laparotomy for small bowel foreign body removal. EXAM: CT ABDOMEN AND PELVIS WITH CONTRAST TECHNIQUE: Multidetector CT imaging of the abdomen and pelvis was performed using the standard protocol following bolus administration of intravenous contrast. CONTRAST:  20mL OMNIPAQUE IOHEXOL 300 MG/ML  SOLN COMPARISON:  Abdominal radiograph June 18, 2018 and CT abdomen and pelvis April 16, 2018 in CT abdomen and pelvis April 05, 2014 FINDINGS: LOWER CHEST: Stable 5 mm RIGHT lower lobe subsolid pulmonary nodule. No routine indicated follow-up. This recommendation follows the consensus statement: Guidelines for Management of Small Pulmonary Nodules Detected on CT Images: From the Fleischner Society 2017; Radiology 2017; 284:228-243. nodular scarring lingula. Bibasilar atelectasis/scarring. Code heart size is normal. Small pericardial effusion. HEPATOBILIARY: Calcified hepatic granuloma, liver is otherwise unremarkable. Cholelithiasis without CT findings of acute cholecystitis. PANCREAS: Normal. SPLEEN: Normal. ADRENALS/URINARY TRACT: Kidneys are orthotopic, demonstrating symmetric enhancement. LEFT  extrarenal pelvis. No nephrolithiasis, hydronephrosis or solid renal masses. Homogeneously hypodense benign-appearing cyst lower pole LEFT kidney. Additional smaller LEFT renal cysts with too small to characterize hypodensities bilateral kidneys. The unopacified ureters are normal in course and caliber. Delayed imaging through the kidneys demonstrates symmetric prompt contrast excretion within the proximal urinary collecting system. Urinary bladder is well distended, mild prolapse. LEFT bladder diverticulum. Normal adrenal glands. STOMACH/BOWEL: Dilated small bowel measuring to 4 cm a with small bowel feces, focal transition point RIGHT lower quadrant (coronal image 38). Duodenal diverticulum. Severe colonic diverticulosis, decompressed: Without inflammation. VASCULAR/LYMPHATIC: Severe calcific atherosclerosis aortoiliac vessels with chronic infrarenal aortic dissection and 2.3 cm infrarenal aortic ectasia. No lymphadenopathy by CT size criteria. REPRODUCTIVE: Status post hysterectomy. Redemonstration of 4 cm RIGHT adnexal cyst. OTHER: Small amount of free fluid in central mesentery. MUSCULOSKELETAL: Nonacute.  Upper lumbar levoscoliosis. IMPRESSION: 1. Recurrent high-grade small-bowel obstruction, transition point RIGHT lower quadrant most compatible with adhesion. 2. Cholelithiasis without CT findings of acute cholecystitis. 3. **An incidental finding of potential clinical significance has been found. 4 cm RIGHT adnexal cyst, recommend non emergent pelvic ultrasound. This recommendation follows ACR consensus guidelines: White Paper of the ACR Incidental Findings Committee II on Adnexal Findings. J Am Coll Radiol [phone].** Aortic Atherosclerosis (ICD10-I70.0). Electronically Signed   By: Elon Alas M.D.   On: 06/18/2018 03:02   Dg Abdomen Acute W/chest  Result Date: 06/18/2018 CLINICAL DATA:  Abdominal pain  EXAM: DG ABDOMEN ACUTE W/ 1V CHEST COMPARISON:  04/17/2018, 04/16/2018 FINDINGS:  Single-view chest demonstrates borderline cardiomegaly. Aortic atherosclerosis. No acute opacity or pleural effusion. Scoliosis of the spine. Supine and upright views of the abdomen demonstrate no free air. Multiple dilated loops of small bowel measuring up to 4.4 cm with multiple fluid levels and paucity of distal gas consistent with a small bowel obstruction. IMPRESSION: 1. No radiographic evidence for acute cardiopulmonary abnormality. 2. Findings consistent with small bowel obstruction. Electronically Signed   By: Donavan Foil M.D.   On: 06/18/2018 01:32   Dg Abd Portable 1v  Result Date: 06/21/2018 CLINICAL DATA:  Small-bowel obstruction. EXAM: PORTABLE ABDOMEN - 1 VIEW COMPARISON:  06/20/2018. FINDINGS: NG tube noted with tip and side hole below left hemidiaphragm. Small-bowel distention has improved. Air and contrast noted in the colon. No free air. IMPRESSION: NG tube in stable position. Small-bowel distention is improved. Air and contrast in the colon. No free air. Electronically Signed   By: Marcello Moores  Register   On: 06/21/2018 09:32   Dg Abd Portable 1v  Result Date: 06/20/2018 CLINICAL DATA:  Small bowel obstruction. EXAM: PORTABLE ABDOMEN - 1 VIEW COMPARISON:  06/19/2018 FINDINGS: Nasogastric tube remains in place with tip in the mid stomach. Mild decrease in dilated small bowel loops seen in the lower abdomen pelvis. Small amount of residual oral contrast material seen in the left colon. IMPRESSION: Mild decrease in small bowel dilatation since prior exam. Electronically Signed   By: Earle Gell M.D.   On: 06/20/2018 10:19   Dg Abd Portable 1v-small Bowel Obstruction Protocol-initial, 8 Hr Delay  Result Date: 06/18/2018 CLINICAL DATA:  Small-bowel obstruction EXAM: PORTABLE ABDOMEN - 1 VIEW COMPARISON:  Abdominal radiograph of earlier today. FINDINGS: The nasogastric tube tip projects in the proximal gastric body with the tip at the level of the GE junction. There are loops of mildly  distended gas-filled small bowel within the abdomen. There is contrast within the urinary bladder. There is moderate levocurvature centered at L3. IMPRESSION: Advancement of the nasogastric tube by 5-10 cm is recommended to assure that the proximal port remains below the GE junction. Fairly stable mild gaseous distention of small-bowel loops consistent with obstruction. Electronically Signed   By: David  Martinique M.D.   On: 06/18/2018 16:05   Dg Abd Portable 1v-small Bowel Protocol-position Verification  Result Date: 06/18/2018 CLINICAL DATA:  Assess nasogastric tube position. EXAM: PORTABLE ABDOMEN - 1 VIEW COMPARISON:  Chest x-ray of June 18, 2018 FINDINGS: The esophagogastric tubes tip projects in the gastric cardia with the proximal port just above the GE junction. There are loops of mildly distended gas-filled small bowel in the mid to lower abdomen. No free extraluminal gas is observed. There is contrast within the renal collecting systems bilaterally with prominence of the left renal pelvis. IMPRESSION: High positioning of the esophagogastric tube. Advancement by 5-10 cm is recommended. Mid to distal small bowel obstruction. Prominence of the left renal pelvis likely reflecting an extrarenal pelvis given the appearance on the CT scan of earlier today. Electronically Signed   By: David  Martinique M.D.   On: 06/18/2018 07:24    DISCHARGE EXAMINATION: Vitals:   06/22/18 0409 06/22/18 1438 06/22/18 2207 06/23/18 0548  BP: 137/68 140/63 139/61 (!) 151/69  Pulse: 67 (!) 57 64 (!) 59  Resp: 20 18 17 18   Temp: 97.6 F (36.4 C) 98.2 F (36.8 C) 97.8 F (36.6 C) 97.7 F (36.5 C)  TempSrc: Oral Oral Oral Oral  SpO2: 91% 98% 94% 93%  Weight:      Height:       General appearance: alert, cooperative, appears stated age and no distress Resp: clear to auscultation bilaterally Cardio: regular rate and rhythm, S1, S2 normal, no murmur, click, rub or gallop GI: soft, non-tender; bowel sounds normal;  no masses,  no organomegaly Extremities: extremities normal, atraumatic, no cyanosis or edema   DISPOSITION: Home  Discharge Instructions    Call MD for:  difficulty breathing, headache or visual disturbances   Complete by:  As directed    Call MD for:  extreme fatigue   Complete by:  As directed    Call MD for:  persistant dizziness or light-headedness   Complete by:  As directed    Call MD for:  persistant nausea and vomiting   Complete by:  As directed    Call MD for:  severe uncontrolled pain   Complete by:  As directed    Call MD for:  temperature >100.4   Complete by:  As directed    Discharge instructions   Complete by:  As directed    Please be sure to follow-up with your primary care provider within 1 week.  Eat a soft diet for the next 5 days and then can advance to your usual diet.  You were cared for by a hospitalist during your hospital stay. If you have any questions about your discharge medications or the care you received while you were in the hospital after you are discharged, you can call the unit and asked to speak with the hospitalist on call if the hospitalist that took care of you is not available. Once you are discharged, your primary care physician will handle any further medical issues. Please note that NO REFILLS for any discharge medications will be authorized once you are discharged, as it is imperative that you return to your primary care physician (or establish a relationship with a primary care physician if you do not have one) for your aftercare needs so that they can reassess your need for medications and monitor your lab values. If you do not have a primary care physician, you can call [phone] for a physician referral.   Increase activity slowly   Complete by:  As directed         Allergies as of 06/23/2018      Reactions   Diovan [valsartan] Other (See Comments)   gas   Hctz [hydrochlorothiazide]    gas   Tekamlo [aliskiren-amlodipine]    gas     Valturna [aliskiren-valsartan]    Gas      Medication List    STOP taking these medications   irbesartan 300 MG tablet Commonly known as:  AVAPRO     TAKE these medications   acetaminophen 325 MG tablet Commonly known as:  TYLENOL Take 2 tablets (650 mg total) by mouth every 4 (four) hours as needed. What changed:  reasons to take this   amLODipine 10 MG tablet Commonly known as:  NORVASC Take 10 mg by mouth daily.   aspirin EC 81 MG tablet Take 81 mg by mouth at bedtime.   calcium carbonate 500 MG chewable tablet Commonly known as:  TUMS - dosed in mg elemental calcium Chew 1-2 tablets (200-400 mg of elemental calcium total) by mouth 3 (three) times daily as needed. What changed:  reasons to take this   cholecalciferol 1000 units tablet Commonly known as:  VITAMIN D Take 1,000 Units by mouth  at bedtime.   DENTAGEL 1.1 % Gel dental gel Generic drug:  sodium fluoride Take 1 application by mouth daily. Place on toothbrush and brush daily for 2 minutes to prevent tooth decay/sensitivity   ferrous sulfate 325 (65 FE) MG tablet Take 325 mg by mouth daily with breakfast. Unknown dose   hydroxypropyl methylcellulose / hypromellose 2.5 % ophthalmic solution Commonly known as:  ISOPTO TEARS / GONIOVISC Place 1 drop into both eyes as needed for dry eyes.   ipratropium 0.06 % nasal spray Commonly known as:  ATROVENT Place 1 spray into both nostrils 3 (three) times daily.   lactobacillus acidophilus Tabs tablet Take 1 tablet by mouth at bedtime. Uses Cultural brand   loratadine 10 MG tablet Commonly known as:  CLARITIN Take 10 mg by mouth daily as needed for allergies.   multivitamin with minerals Tabs tablet Take 1 tablet by mouth daily.   olmesartan 20 MG tablet Commonly known as:  BENICAR Take 20 mg by mouth daily.   psyllium 58.6 % packet Commonly known as:  METAMUCIL Take 1 packet by mouth at bedtime.   rosuvastatin 5 MG tablet Commonly known as:   CRESTOR Take 5 mg by mouth at bedtime.   senna 8.6 MG Tabs tablet Commonly known as:  SENOKOT Take 1 tablet (8.6 mg total) by mouth at bedtime.        Follow-up [facility], [place]. Call.   Specialty:  General Surgery Why:  as needed with questions or concerns Contact information: [address] [place] [phone]       Carol Ada, MD. Schedule an appointment as soon as possible for a visit in 1 week(s).   Specialty:  Family Medicine Contact information: [place] [address] [phone]           TOTAL DISCHARGE TIME: 44 mins  Bonnielee Haff  [facility] Pager [phone]  06/23/2018, 12:03 PM

## 2018-06-26 DIAGNOSIS — K56609 Unspecified intestinal obstruction, unspecified as to partial versus complete obstruction: Secondary | ICD-10-CM | POA: Diagnosis not present

## 2018-06-26 DIAGNOSIS — D696 Thrombocytopenia, unspecified: Secondary | ICD-10-CM | POA: Diagnosis not present

## 2018-06-26 DIAGNOSIS — R2689 Other abnormalities of gait and mobility: Secondary | ICD-10-CM | POA: Diagnosis not present

## 2018-06-26 DIAGNOSIS — I10 Essential (primary) hypertension: Secondary | ICD-10-CM | POA: Diagnosis not present

## 2018-08-09 DIAGNOSIS — M810 Age-related osteoporosis without current pathological fracture: Secondary | ICD-10-CM | POA: Diagnosis not present

## 2018-08-09 DIAGNOSIS — N183 Chronic kidney disease, stage 3 (moderate): Secondary | ICD-10-CM | POA: Diagnosis not present

## 2018-08-09 DIAGNOSIS — Z0000 Encounter for general adult medical examination without abnormal findings: Secondary | ICD-10-CM | POA: Diagnosis not present

## 2018-08-09 DIAGNOSIS — Z8719 Personal history of other diseases of the digestive system: Secondary | ICD-10-CM | POA: Diagnosis not present

## 2018-08-09 DIAGNOSIS — H9390 Unspecified disorder of ear, unspecified ear: Secondary | ICD-10-CM | POA: Diagnosis not present

## 2018-08-09 DIAGNOSIS — Z1389 Encounter for screening for other disorder: Secondary | ICD-10-CM | POA: Diagnosis not present

## 2018-08-09 DIAGNOSIS — E7800 Pure hypercholesterolemia, unspecified: Secondary | ICD-10-CM | POA: Diagnosis not present

## 2018-08-09 DIAGNOSIS — I10 Essential (primary) hypertension: Secondary | ICD-10-CM | POA: Diagnosis not present

## 2018-08-21 DIAGNOSIS — Z1231 Encounter for screening mammogram for malignant neoplasm of breast: Secondary | ICD-10-CM | POA: Diagnosis not present

## 2018-09-05 DIAGNOSIS — E118 Type 2 diabetes mellitus with unspecified complications: Secondary | ICD-10-CM | POA: Diagnosis not present

## 2018-09-05 DIAGNOSIS — Z0000 Encounter for general adult medical examination without abnormal findings: Secondary | ICD-10-CM | POA: Diagnosis not present

## 2018-09-05 DIAGNOSIS — M810 Age-related osteoporosis without current pathological fracture: Secondary | ICD-10-CM | POA: Diagnosis not present

## 2018-09-05 DIAGNOSIS — D631 Anemia in chronic kidney disease: Secondary | ICD-10-CM | POA: Diagnosis not present

## 2018-09-05 DIAGNOSIS — N183 Chronic kidney disease, stage 3 (moderate): Secondary | ICD-10-CM | POA: Diagnosis not present

## 2018-09-05 DIAGNOSIS — E7800 Pure hypercholesterolemia, unspecified: Secondary | ICD-10-CM | POA: Diagnosis not present

## 2018-09-05 DIAGNOSIS — N2581 Secondary hyperparathyroidism of renal origin: Secondary | ICD-10-CM | POA: Diagnosis not present

## 2018-09-05 DIAGNOSIS — R490 Dysphonia: Secondary | ICD-10-CM | POA: Diagnosis not present

## 2018-09-05 DIAGNOSIS — I129 Hypertensive chronic kidney disease with stage 1 through stage 4 chronic kidney disease, or unspecified chronic kidney disease: Secondary | ICD-10-CM | POA: Diagnosis not present

## 2018-09-05 DIAGNOSIS — D696 Thrombocytopenia, unspecified: Secondary | ICD-10-CM | POA: Diagnosis not present

## 2018-09-05 DIAGNOSIS — N189 Chronic kidney disease, unspecified: Secondary | ICD-10-CM | POA: Diagnosis not present

## 2018-09-05 DIAGNOSIS — D6851 Activated protein C resistance: Secondary | ICD-10-CM | POA: Diagnosis not present

## 2018-09-05 DIAGNOSIS — H9193 Unspecified hearing loss, bilateral: Secondary | ICD-10-CM | POA: Diagnosis not present

## 2018-12-04 DIAGNOSIS — L723 Sebaceous cyst: Secondary | ICD-10-CM | POA: Diagnosis not present

## 2018-12-04 DIAGNOSIS — Z23 Encounter for immunization: Secondary | ICD-10-CM | POA: Diagnosis not present

## 2018-12-04 DIAGNOSIS — D2272 Melanocytic nevi of left lower limb, including hip: Secondary | ICD-10-CM | POA: Diagnosis not present

## 2018-12-04 DIAGNOSIS — L821 Other seborrheic keratosis: Secondary | ICD-10-CM | POA: Diagnosis not present

## 2018-12-04 DIAGNOSIS — D225 Melanocytic nevi of trunk: Secondary | ICD-10-CM | POA: Diagnosis not present

## 2019-02-07 DIAGNOSIS — N183 Chronic kidney disease, stage 3 (moderate): Secondary | ICD-10-CM | POA: Diagnosis not present

## 2019-02-07 DIAGNOSIS — E7800 Pure hypercholesterolemia, unspecified: Secondary | ICD-10-CM | POA: Diagnosis not present

## 2019-02-07 DIAGNOSIS — I10 Essential (primary) hypertension: Secondary | ICD-10-CM | POA: Diagnosis not present

## 2019-02-22 DIAGNOSIS — M810 Age-related osteoporosis without current pathological fracture: Secondary | ICD-10-CM | POA: Diagnosis not present

## 2019-02-22 DIAGNOSIS — E7800 Pure hypercholesterolemia, unspecified: Secondary | ICD-10-CM | POA: Diagnosis not present

## 2019-02-22 DIAGNOSIS — I10 Essential (primary) hypertension: Secondary | ICD-10-CM | POA: Diagnosis not present

## 2019-02-22 DIAGNOSIS — N183 Chronic kidney disease, stage 3 (moderate): Secondary | ICD-10-CM | POA: Diagnosis not present

## 2019-03-05 DIAGNOSIS — H9193 Unspecified hearing loss, bilateral: Secondary | ICD-10-CM | POA: Diagnosis not present

## 2019-03-05 DIAGNOSIS — I129 Hypertensive chronic kidney disease with stage 1 through stage 4 chronic kidney disease, or unspecified chronic kidney disease: Secondary | ICD-10-CM | POA: Diagnosis not present

## 2019-03-05 DIAGNOSIS — M810 Age-related osteoporosis without current pathological fracture: Secondary | ICD-10-CM | POA: Diagnosis not present

## 2019-03-05 DIAGNOSIS — R490 Dysphonia: Secondary | ICD-10-CM | POA: Diagnosis not present

## 2019-03-05 DIAGNOSIS — Z0000 Encounter for general adult medical examination without abnormal findings: Secondary | ICD-10-CM | POA: Diagnosis not present

## 2019-03-05 DIAGNOSIS — N2581 Secondary hyperparathyroidism of renal origin: Secondary | ICD-10-CM | POA: Diagnosis not present

## 2019-03-05 DIAGNOSIS — D6851 Activated protein C resistance: Secondary | ICD-10-CM | POA: Diagnosis not present

## 2019-03-05 DIAGNOSIS — E7800 Pure hypercholesterolemia, unspecified: Secondary | ICD-10-CM | POA: Diagnosis not present

## 2019-03-05 DIAGNOSIS — D631 Anemia in chronic kidney disease: Secondary | ICD-10-CM | POA: Diagnosis not present

## 2019-03-05 DIAGNOSIS — N183 Chronic kidney disease, stage 3 (moderate): Secondary | ICD-10-CM | POA: Diagnosis not present

## 2019-03-05 DIAGNOSIS — E118 Type 2 diabetes mellitus with unspecified complications: Secondary | ICD-10-CM | POA: Diagnosis not present

## 2019-03-05 DIAGNOSIS — D696 Thrombocytopenia, unspecified: Secondary | ICD-10-CM | POA: Diagnosis not present

## 2019-03-06 DIAGNOSIS — N390 Urinary tract infection, site not specified: Secondary | ICD-10-CM | POA: Diagnosis not present

## 2019-03-19 DIAGNOSIS — N183 Chronic kidney disease, stage 3 (moderate): Secondary | ICD-10-CM | POA: Diagnosis not present

## 2019-03-19 DIAGNOSIS — I10 Essential (primary) hypertension: Secondary | ICD-10-CM | POA: Diagnosis not present

## 2019-03-19 DIAGNOSIS — M810 Age-related osteoporosis without current pathological fracture: Secondary | ICD-10-CM | POA: Diagnosis not present

## 2019-03-19 DIAGNOSIS — E7800 Pure hypercholesterolemia, unspecified: Secondary | ICD-10-CM | POA: Diagnosis not present

## 2019-04-18 DIAGNOSIS — Z961 Presence of intraocular lens: Secondary | ICD-10-CM | POA: Diagnosis not present

## 2019-04-18 DIAGNOSIS — H524 Presbyopia: Secondary | ICD-10-CM | POA: Diagnosis not present

## 2019-04-18 DIAGNOSIS — H52203 Unspecified astigmatism, bilateral: Secondary | ICD-10-CM | POA: Diagnosis not present

## 2019-05-28 DIAGNOSIS — M799 Soft tissue disorder, unspecified: Secondary | ICD-10-CM | POA: Diagnosis not present

## 2019-05-28 DIAGNOSIS — R21 Rash and other nonspecific skin eruption: Secondary | ICD-10-CM | POA: Diagnosis not present

## 2019-06-13 DIAGNOSIS — Z23 Encounter for immunization: Secondary | ICD-10-CM | POA: Diagnosis not present

## 2019-09-05 DIAGNOSIS — Z0000 Encounter for general adult medical examination without abnormal findings: Secondary | ICD-10-CM | POA: Diagnosis not present

## 2019-09-05 DIAGNOSIS — N1831 Chronic kidney disease, stage 3a: Secondary | ICD-10-CM | POA: Diagnosis not present

## 2019-09-05 DIAGNOSIS — I10 Essential (primary) hypertension: Secondary | ICD-10-CM | POA: Diagnosis not present

## 2019-09-05 DIAGNOSIS — Z1389 Encounter for screening for other disorder: Secondary | ICD-10-CM | POA: Diagnosis not present

## 2019-09-05 DIAGNOSIS — E7800 Pure hypercholesterolemia, unspecified: Secondary | ICD-10-CM | POA: Diagnosis not present

## 2019-09-12 DIAGNOSIS — E7800 Pure hypercholesterolemia, unspecified: Secondary | ICD-10-CM | POA: Diagnosis not present

## 2019-09-12 DIAGNOSIS — N183 Chronic kidney disease, stage 3 unspecified: Secondary | ICD-10-CM | POA: Diagnosis not present

## 2019-09-12 DIAGNOSIS — D631 Anemia in chronic kidney disease: Secondary | ICD-10-CM | POA: Diagnosis not present

## 2019-09-12 DIAGNOSIS — I129 Hypertensive chronic kidney disease with stage 1 through stage 4 chronic kidney disease, or unspecified chronic kidney disease: Secondary | ICD-10-CM | POA: Diagnosis not present

## 2019-11-01 ENCOUNTER — Ambulatory Visit: Payer: PPO

## 2019-11-09 ENCOUNTER — Ambulatory Visit: Payer: PPO | Attending: Internal Medicine

## 2019-11-09 DIAGNOSIS — Z23 Encounter for immunization: Secondary | ICD-10-CM | POA: Insufficient documentation

## 2019-11-09 NOTE — Progress Notes (Signed)
   Covid-19 Vaccination Clinic  Name:  [name]    MRN: [id number] DOB: [birth year]  11/09/2019  [name] was observed post Covid-19 immunization for 30 minutes based on pre-vaccination screening without incidence. She was provided with Vaccine Information Sheet and instruction to access the V-Safe system.   [name] was instructed to call 911 with any severe reactions post vaccine: Marland Kitchen Difficulty breathing  . Swelling of your face and throat  . A fast heartbeat  . A bad rash all over your body  . Dizziness and weakness    Immunizations Administered    Name Date Dose VIS Date Route   Pfizer COVID-19 Vaccine 11/09/2019  2:59 PM 0.3 mL 09/13/2019 Intramuscular   Manufacturer: [place]   Lot: [id number]   [place]: [id number]

## 2019-12-04 ENCOUNTER — Ambulatory Visit: Payer: PPO | Attending: Internal Medicine

## 2019-12-04 DIAGNOSIS — Z23 Encounter for immunization: Secondary | ICD-10-CM | POA: Insufficient documentation

## 2019-12-04 NOTE — Progress Notes (Signed)
   Covid-19 Vaccination Clinic  Name:  [name]    MRN: [id number] DOB: 1928-10-24  12/04/2019  [name] was observed post Covid-19 immunization for 15 minutes without incident. She was provided with Vaccine Information Sheet and instruction to access the V-Safe system.   [name] was instructed to call 911 with any severe reactions post vaccine: Marland Kitchen Difficulty breathing  . Swelling of face and throat  . A fast heartbeat  . A bad rash all over body  . Dizziness and weakness   Immunizations Administered    Name Date Dose VIS Date Route   Pfizer COVID-19 Vaccine 12/04/2019 10:38 AM 0.3 mL 09/13/2019 Intramuscular   Manufacturer: [place]   Lot: [id number]   [place]: [id number]

## 2019-12-17 DIAGNOSIS — D485 Neoplasm of uncertain behavior of skin: Secondary | ICD-10-CM | POA: Diagnosis not present

## 2019-12-17 DIAGNOSIS — L720 Epidermal cyst: Secondary | ICD-10-CM | POA: Diagnosis not present

## 2019-12-17 DIAGNOSIS — L820 Inflamed seborrheic keratosis: Secondary | ICD-10-CM | POA: Diagnosis not present

## 2019-12-17 DIAGNOSIS — L723 Sebaceous cyst: Secondary | ICD-10-CM | POA: Diagnosis not present

## 2019-12-17 DIAGNOSIS — L570 Actinic keratosis: Secondary | ICD-10-CM | POA: Diagnosis not present

## 2019-12-17 DIAGNOSIS — Z23 Encounter for immunization: Secondary | ICD-10-CM | POA: Diagnosis not present

## 2019-12-17 DIAGNOSIS — D225 Melanocytic nevi of trunk: Secondary | ICD-10-CM | POA: Diagnosis not present

## 2019-12-17 DIAGNOSIS — L578 Other skin changes due to chronic exposure to nonionizing radiation: Secondary | ICD-10-CM | POA: Diagnosis not present

## 2020-02-20 DIAGNOSIS — E7800 Pure hypercholesterolemia, unspecified: Secondary | ICD-10-CM | POA: Diagnosis not present

## 2020-02-20 DIAGNOSIS — M810 Age-related osteoporosis without current pathological fracture: Secondary | ICD-10-CM | POA: Diagnosis not present

## 2020-02-20 DIAGNOSIS — I10 Essential (primary) hypertension: Secondary | ICD-10-CM | POA: Diagnosis not present

## 2020-02-20 DIAGNOSIS — N1831 Chronic kidney disease, stage 3a: Secondary | ICD-10-CM | POA: Diagnosis not present

## 2020-03-05 DIAGNOSIS — I10 Essential (primary) hypertension: Secondary | ICD-10-CM | POA: Diagnosis not present

## 2020-03-05 DIAGNOSIS — N1831 Chronic kidney disease, stage 3a: Secondary | ICD-10-CM | POA: Diagnosis not present

## 2020-03-05 DIAGNOSIS — E7800 Pure hypercholesterolemia, unspecified: Secondary | ICD-10-CM | POA: Diagnosis not present

## 2020-04-21 DIAGNOSIS — H52203 Unspecified astigmatism, bilateral: Secondary | ICD-10-CM | POA: Diagnosis not present

## 2020-04-21 DIAGNOSIS — Z961 Presence of intraocular lens: Secondary | ICD-10-CM | POA: Diagnosis not present

## 2020-04-21 DIAGNOSIS — H5213 Myopia, bilateral: Secondary | ICD-10-CM | POA: Diagnosis not present

## 2020-04-21 DIAGNOSIS — H04123 Dry eye syndrome of bilateral lacrimal glands: Secondary | ICD-10-CM | POA: Diagnosis not present

## 2020-04-21 DIAGNOSIS — H524 Presbyopia: Secondary | ICD-10-CM | POA: Diagnosis not present

## 2020-04-22 DIAGNOSIS — H903 Sensorineural hearing loss, bilateral: Secondary | ICD-10-CM | POA: Diagnosis not present

## 2020-05-07 ENCOUNTER — Ambulatory Visit (INDEPENDENT_AMBULATORY_CARE_PROVIDER_SITE_OTHER): Payer: PPO | Admitting: Otolaryngology

## 2020-05-07 ENCOUNTER — Other Ambulatory Visit: Payer: Self-pay

## 2020-05-07 VITALS — Temp 97.5°F

## 2020-05-07 DIAGNOSIS — H6123 Impacted cerumen, bilateral: Secondary | ICD-10-CM | POA: Diagnosis not present

## 2020-05-07 NOTE — Progress Notes (Signed)
HPI: [name] is a 86 y.o. female who presents for evaluation of wax buildup in both ears referred by [facility].  She has bilateral hearing aids but did not bring them with her..  Past Medical History:  Diagnosis Date   Chronic kidney disease    DVT (deep venous thrombosis) (HCC)    Dysphonia    Factor V Leiden (HCC)    Hypertension    Osteoporosis    PNA (pneumonia) 12/31/2012   Pneumonitis 03/19/2013   SBO (small bowel obstruction) ([facility]) 06/2018   Small bowel obstruction (HCC)    Past Surgical History:  Procedure Laterality Date   ABDOMINAL HYSTERECTOMY     APPENDECTOMY     LAPAROTOMY N/A 01/01/2013   Procedure: EXPLORATORY LAPAROTOMY removal of smal bowel foreign body;  Surgeon: Madilyn Hook, DO;  Location: [facility];  Service: General;  Laterality: N/A;   TONSILLECTOMY     Social History   Socioeconomic History   Marital status: Single    Spouse name: Not on file   Number of children: Not on file   Years of education: Not on file   Highest education level: Not on file  Occupational History   Not on file  Tobacco Use   Smoking status: Former Smoker    Quit date: 05/04/1999    Years since quitting: 21.0   Smokeless tobacco: Never Used  Vaping Use   Vaping Use: Never used  Substance and Sexual Activity   Alcohol use: Yes    Comment: occasionally   Drug use: No   Sexual activity: Not Currently  Other Topics Concern   Not on file  Social History Narrative   Not on file   Social Determinants of Health   Financial Resource Strain:    Difficulty of Paying Living Expenses:   Food Insecurity:    Worried About Charity fundraiser in the Last Year:    Arboriculturist in the Last Year:   Transportation Needs:    Film/video editor (Medical):    Lack of Transportation (Non-Medical):   Physical Activity:    Days of Exercise per Week:    Minutes of Exercise per Session:   Stress:    Feeling of Stress :   Social  Connections:    Frequency of Communication with Friends and Family:    Frequency of Social Gatherings with Friends and Family:    Attends Religious Services:    Active Member of Clubs or Organizations:    Attends Archivist Meetings:    Marital Status:    Family History  Family history unknown: Yes   Allergies  Allergen Reactions   Diovan [Valsartan] Other (See Comments)    gas   Hctz [Hydrochlorothiazide]     gas   Tekamlo [Aliskiren-Amlodipine]     gas   Valturna [Aliskiren-Valsartan]     Gas    Prior to Admission medications   Medication Sig Start Date End Date Taking? Authorizing Provider  acetaminophen (TYLENOL) 325 MG tablet Take 2 tablets (650 mg total) by mouth every 4 (four) hours as needed. Patient taking differently: Take 650 mg by mouth every 4 (four) hours as needed for mild pain.  01/21/13  Yes Whiteheart, Cristal Ford, NP  amLODipine (NORVASC) 10 MG tablet Take 10 mg by mouth daily.   Yes [provider]  aspirin EC 81 MG tablet Take 81 mg by mouth at bedtime.    Yes [provider]  calcium carbonate (TUMS - DOSED  IN MG ELEMENTAL CALCIUM) 500 MG chewable tablet Chew 1-2 tablets (200-400 mg of elemental calcium total) by mouth 3 (three) times daily as needed. Patient taking differently: Chew 1-2 tablets by mouth 3 (three) times daily as needed for indigestion.  01/21/13  Yes Whiteheart, Cristal Ford, NP  cholecalciferol (VITAMIN D) 1000 units tablet Take 1,000 Units by mouth at bedtime.   Yes [provider]  DENTAGEL 1.1 % GEL dental gel Take 1 application by mouth daily. Place on toothbrush and brush daily for 2 minutes to prevent tooth decay/sensitivity 01/16/18  Yes [provider]  ferrous sulfate 325 (65 FE) MG tablet Take 325 mg by mouth daily with breakfast. Unknown dose   Yes [provider]  hydroxypropyl methylcellulose / hypromellose (ISOPTO TEARS / GONIOVISC) 2.5 % ophthalmic solution Place 1 drop  into both eyes as needed for dry eyes.   Yes [provider]  ipratropium (ATROVENT) 0.06 % nasal spray Place 1 spray into both nostrils 3 (three) times daily. 04/13/18  Yes [provider]  lactobacillus acidophilus (BACID) TABS tablet Take 1 tablet by mouth at bedtime. Uses Cultural brand    Yes [provider]  loratadine (CLARITIN) 10 MG tablet Take 10 mg by mouth daily as needed for allergies.    Yes [provider]  Multiple Vitamin (MULTIVITAMIN WITH MINERALS) TABS Take 1 tablet by mouth daily.   Yes [provider]  olmesartan (BENICAR) 20 MG tablet Take 20 mg by mouth daily. 05/21/18  Yes [provider]  psyllium (METAMUCIL) 58.6 % packet Take 1 packet by mouth at bedtime.    Yes [provider]  rosuvastatin (CRESTOR) 5 MG tablet Take 5 mg by mouth at bedtime.    Yes [provider]  senna (SENOKOT) 8.6 MG TABS tablet Take 1 tablet (8.6 mg total) by mouth at bedtime. 06/23/18  Yes Bonnielee Haff, MD     Positive ROS: Otherwise negative  All other systems have been reviewed and were otherwise negative with the exception of those mentioned in the HPI and as above.  Physical Exam: Constitutional: Alert, well-appearing, no acute distress Ears: External ears without lesions or tenderness. Ear canals with a large amount of wax in both ear canals that was cleaned with suction and forceps.  Ear canals and TMs were otherwise clear.. Nasal: External nose without lesions. Clear nasal passages Oral: Oropharynx clear. Neck: No palpable adenopathy or masses Respiratory: Breathing comfortably  Skin: No facial/neck lesions or rash noted.  Cerumen impaction removal  Date/Time: 05/07/2020 5:20 PM Performed by: Rozetta Nunnery, MD Authorized by: Rozetta Nunnery, MD   Consent:    Consent obtained:  Verbal   Consent given by:  Patient   Risks discussed:  Pain and bleeding Procedure details:    Location:  L ear  and R ear   Procedure type: suction and forceps   Post-procedure details:    Inspection:  TM intact and canal normal   Hearing quality:  Improved   Patient tolerance of procedure:  Tolerated well, no immediate complications Comments:     TMs are clear bilaterally.    Assessment: Bilateral cerumen impactions. Bilateral SNHL.  Plan: She will follow-up as needed  Radene Journey, MD

## 2020-05-29 DIAGNOSIS — M810 Age-related osteoporosis without current pathological fracture: Secondary | ICD-10-CM | POA: Diagnosis not present

## 2020-05-29 DIAGNOSIS — I10 Essential (primary) hypertension: Secondary | ICD-10-CM | POA: Diagnosis not present

## 2020-05-29 DIAGNOSIS — N1831 Chronic kidney disease, stage 3a: Secondary | ICD-10-CM | POA: Diagnosis not present

## 2020-05-29 DIAGNOSIS — E7800 Pure hypercholesterolemia, unspecified: Secondary | ICD-10-CM | POA: Diagnosis not present

## 2020-06-03 DIAGNOSIS — H903 Sensorineural hearing loss, bilateral: Secondary | ICD-10-CM | POA: Diagnosis not present

## 2020-08-11 DIAGNOSIS — E7800 Pure hypercholesterolemia, unspecified: Secondary | ICD-10-CM | POA: Diagnosis not present

## 2020-08-11 DIAGNOSIS — N183 Chronic kidney disease, stage 3 unspecified: Secondary | ICD-10-CM | POA: Diagnosis not present

## 2020-08-11 DIAGNOSIS — I129 Hypertensive chronic kidney disease with stage 1 through stage 4 chronic kidney disease, or unspecified chronic kidney disease: Secondary | ICD-10-CM | POA: Diagnosis not present

## 2020-08-11 DIAGNOSIS — D631 Anemia in chronic kidney disease: Secondary | ICD-10-CM | POA: Diagnosis not present

## 2020-08-12 DIAGNOSIS — M810 Age-related osteoporosis without current pathological fracture: Secondary | ICD-10-CM | POA: Diagnosis not present

## 2020-08-12 DIAGNOSIS — N1831 Chronic kidney disease, stage 3a: Secondary | ICD-10-CM | POA: Diagnosis not present

## 2020-08-12 DIAGNOSIS — E7800 Pure hypercholesterolemia, unspecified: Secondary | ICD-10-CM | POA: Diagnosis not present

## 2020-08-12 DIAGNOSIS — I10 Essential (primary) hypertension: Secondary | ICD-10-CM | POA: Diagnosis not present

## 2020-10-07 DIAGNOSIS — L989 Disorder of the skin and subcutaneous tissue, unspecified: Secondary | ICD-10-CM | POA: Diagnosis not present

## 2020-10-07 DIAGNOSIS — I10 Essential (primary) hypertension: Secondary | ICD-10-CM | POA: Diagnosis not present

## 2020-10-07 DIAGNOSIS — N1831 Chronic kidney disease, stage 3a: Secondary | ICD-10-CM | POA: Diagnosis not present

## 2020-10-07 DIAGNOSIS — Z23 Encounter for immunization: Secondary | ICD-10-CM | POA: Diagnosis not present

## 2020-10-07 DIAGNOSIS — E7800 Pure hypercholesterolemia, unspecified: Secondary | ICD-10-CM | POA: Diagnosis not present

## 2020-10-07 DIAGNOSIS — I700 Atherosclerosis of aorta: Secondary | ICD-10-CM | POA: Diagnosis not present

## 2020-10-07 DIAGNOSIS — D696 Thrombocytopenia, unspecified: Secondary | ICD-10-CM | POA: Diagnosis not present

## 2020-10-07 DIAGNOSIS — R609 Edema, unspecified: Secondary | ICD-10-CM | POA: Diagnosis not present

## 2020-10-07 DIAGNOSIS — Z0000 Encounter for general adult medical examination without abnormal findings: Secondary | ICD-10-CM | POA: Diagnosis not present

## 2020-10-07 DIAGNOSIS — R490 Dysphonia: Secondary | ICD-10-CM | POA: Diagnosis not present

## 2020-10-07 DIAGNOSIS — I739 Peripheral vascular disease, unspecified: Secondary | ICD-10-CM | POA: Diagnosis not present

## 2020-10-07 DIAGNOSIS — Z1389 Encounter for screening for other disorder: Secondary | ICD-10-CM | POA: Diagnosis not present

## 2020-10-08 ENCOUNTER — Other Ambulatory Visit: Payer: Self-pay | Admitting: Family Medicine

## 2020-10-08 DIAGNOSIS — I739 Peripheral vascular disease, unspecified: Secondary | ICD-10-CM

## 2020-10-09 ENCOUNTER — Ambulatory Visit
Admission: RE | Admit: 2020-10-09 | Discharge: 2020-10-09 | Disposition: A | Discharge status: Home / Self Care | Payer: PPO | Source: Ambulatory Visit | Attending: Family Medicine | Admitting: Family Medicine

## 2020-10-09 ENCOUNTER — Other Ambulatory Visit: Payer: Self-pay

## 2020-10-09 DIAGNOSIS — I739 Peripheral vascular disease, unspecified: Secondary | ICD-10-CM | POA: Diagnosis not present

## 2020-10-09 DIAGNOSIS — R58 Hemorrhage, not elsewhere classified: Secondary | ICD-10-CM | POA: Diagnosis not present

## 2020-10-09 DIAGNOSIS — L821 Other seborrheic keratosis: Secondary | ICD-10-CM | POA: Diagnosis not present

## 2020-10-09 DIAGNOSIS — D225 Melanocytic nevi of trunk: Secondary | ICD-10-CM | POA: Diagnosis not present

## 2020-10-09 DIAGNOSIS — L820 Inflamed seborrheic keratosis: Secondary | ICD-10-CM | POA: Diagnosis not present

## 2020-10-09 DIAGNOSIS — D485 Neoplasm of uncertain behavior of skin: Secondary | ICD-10-CM | POA: Diagnosis not present

## 2020-10-19 ENCOUNTER — Inpatient Hospital Stay (HOSPITAL_COMMUNITY)
Admission: EM | Admit: 2020-10-19 | Discharge: 2020-10-23 | DRG: 388 | Disposition: A | Discharge status: Home / Self Care | Payer: PPO | Attending: Internal Medicine | Admitting: Internal Medicine

## 2020-10-19 ENCOUNTER — Emergency Department (HOSPITAL_COMMUNITY): Payer: PPO

## 2020-10-19 ENCOUNTER — Encounter (HOSPITAL_COMMUNITY): Payer: Self-pay | Admitting: Emergency Medicine

## 2020-10-19 DIAGNOSIS — I129 Hypertensive chronic kidney disease with stage 1 through stage 4 chronic kidney disease, or unspecified chronic kidney disease: Secondary | ICD-10-CM | POA: Diagnosis present

## 2020-10-19 DIAGNOSIS — R1111 Vomiting without nausea: Secondary | ICD-10-CM | POA: Diagnosis not present

## 2020-10-19 DIAGNOSIS — K8020 Calculus of gallbladder without cholecystitis without obstruction: Secondary | ICD-10-CM | POA: Diagnosis present

## 2020-10-19 DIAGNOSIS — D696 Thrombocytopenia, unspecified: Secondary | ICD-10-CM | POA: Diagnosis not present

## 2020-10-19 DIAGNOSIS — K56600 Partial intestinal obstruction, unspecified as to cause: Secondary | ICD-10-CM | POA: Diagnosis not present

## 2020-10-19 DIAGNOSIS — M810 Age-related osteoporosis without current pathological fracture: Secondary | ICD-10-CM | POA: Diagnosis not present

## 2020-10-19 DIAGNOSIS — Z4682 Encounter for fitting and adjustment of non-vascular catheter: Secondary | ICD-10-CM | POA: Diagnosis not present

## 2020-10-19 DIAGNOSIS — E860 Dehydration: Secondary | ICD-10-CM | POA: Diagnosis present

## 2020-10-19 DIAGNOSIS — Z90710 Acquired absence of both cervix and uterus: Secondary | ICD-10-CM

## 2020-10-19 DIAGNOSIS — J189 Pneumonia, unspecified organism: Secondary | ICD-10-CM | POA: Diagnosis not present

## 2020-10-19 DIAGNOSIS — I10 Essential (primary) hypertension: Secondary | ICD-10-CM | POA: Diagnosis present

## 2020-10-19 DIAGNOSIS — M160 Bilateral primary osteoarthritis of hip: Secondary | ICD-10-CM | POA: Diagnosis not present

## 2020-10-19 DIAGNOSIS — R1084 Generalized abdominal pain: Secondary | ICD-10-CM | POA: Diagnosis not present

## 2020-10-19 DIAGNOSIS — Z8701 Personal history of pneumonia (recurrent): Secondary | ICD-10-CM

## 2020-10-19 DIAGNOSIS — I77811 Abdominal aortic ectasia: Secondary | ICD-10-CM | POA: Diagnosis present

## 2020-10-19 DIAGNOSIS — Z7982 Long term (current) use of aspirin: Secondary | ICD-10-CM

## 2020-10-19 DIAGNOSIS — D6851 Activated protein C resistance: Secondary | ICD-10-CM | POA: Diagnosis present

## 2020-10-19 DIAGNOSIS — R140 Abdominal distension (gaseous): Secondary | ICD-10-CM | POA: Diagnosis not present

## 2020-10-19 DIAGNOSIS — Z888 Allergy status to other drugs, medicaments and biological substances status: Secondary | ICD-10-CM | POA: Diagnosis not present

## 2020-10-19 DIAGNOSIS — R0902 Hypoxemia: Secondary | ICD-10-CM | POA: Diagnosis not present

## 2020-10-19 DIAGNOSIS — J9601 Acute respiratory failure with hypoxia: Secondary | ICD-10-CM | POA: Diagnosis present

## 2020-10-19 DIAGNOSIS — Z79899 Other long term (current) drug therapy: Secondary | ICD-10-CM

## 2020-10-19 DIAGNOSIS — Z87891 Personal history of nicotine dependence: Secondary | ICD-10-CM | POA: Diagnosis not present

## 2020-10-19 DIAGNOSIS — K5669 Other partial intestinal obstruction: Secondary | ICD-10-CM | POA: Diagnosis not present

## 2020-10-19 DIAGNOSIS — J90 Pleural effusion, not elsewhere classified: Secondary | ICD-10-CM | POA: Diagnosis not present

## 2020-10-19 DIAGNOSIS — Z20822 Contact with and (suspected) exposure to covid-19: Secondary | ICD-10-CM | POA: Diagnosis not present

## 2020-10-19 DIAGNOSIS — K8080 Other cholelithiasis without obstruction: Secondary | ICD-10-CM

## 2020-10-19 DIAGNOSIS — K6389 Other specified diseases of intestine: Secondary | ICD-10-CM | POA: Diagnosis not present

## 2020-10-19 DIAGNOSIS — N179 Acute kidney failure, unspecified: Secondary | ICD-10-CM | POA: Diagnosis present

## 2020-10-19 DIAGNOSIS — R0602 Shortness of breath: Secondary | ICD-10-CM | POA: Diagnosis not present

## 2020-10-19 DIAGNOSIS — N182 Chronic kidney disease, stage 2 (mild): Secondary | ICD-10-CM | POA: Diagnosis not present

## 2020-10-19 DIAGNOSIS — K56609 Unspecified intestinal obstruction, unspecified as to partial versus complete obstruction: Secondary | ICD-10-CM | POA: Diagnosis not present

## 2020-10-19 DIAGNOSIS — R109 Unspecified abdominal pain: Secondary | ICD-10-CM | POA: Diagnosis not present

## 2020-10-19 DIAGNOSIS — R1110 Vomiting, unspecified: Secondary | ICD-10-CM | POA: Diagnosis not present

## 2020-10-19 DIAGNOSIS — Z86718 Personal history of other venous thrombosis and embolism: Secondary | ICD-10-CM | POA: Diagnosis not present

## 2020-10-19 DIAGNOSIS — M4186 Other forms of scoliosis, lumbar region: Secondary | ICD-10-CM | POA: Diagnosis not present

## 2020-10-19 DIAGNOSIS — E1122 Type 2 diabetes mellitus with diabetic chronic kidney disease: Secondary | ICD-10-CM | POA: Diagnosis present

## 2020-10-19 DIAGNOSIS — N189 Chronic kidney disease, unspecified: Secondary | ICD-10-CM | POA: Diagnosis present

## 2020-10-19 DIAGNOSIS — N3289 Other specified disorders of bladder: Secondary | ICD-10-CM | POA: Diagnosis not present

## 2020-10-19 DIAGNOSIS — E119 Type 2 diabetes mellitus without complications: Secondary | ICD-10-CM

## 2020-10-19 DIAGNOSIS — D631 Anemia in chronic kidney disease: Secondary | ICD-10-CM | POA: Diagnosis not present

## 2020-10-19 DIAGNOSIS — E785 Hyperlipidemia, unspecified: Secondary | ICD-10-CM | POA: Diagnosis present

## 2020-10-19 DIAGNOSIS — J439 Emphysema, unspecified: Secondary | ICD-10-CM | POA: Diagnosis not present

## 2020-10-19 DIAGNOSIS — N1330 Unspecified hydronephrosis: Secondary | ICD-10-CM | POA: Diagnosis not present

## 2020-10-19 LAB — CBC
HCT: 50.1 % — ABNORMAL HIGH (ref 36.0–46.0)
Hemoglobin: 16.2 g/dL — ABNORMAL HIGH (ref 12.0–15.0)
MCH: 30.5 pg (ref 26.0–34.0)
MCHC: 32.3 g/dL (ref 30.0–36.0)
MCV: 94.4 fL (ref 80.0–100.0)
Platelets: 160 10*3/uL (ref 150–400)
RBC: 5.31 MIL/uL — ABNORMAL HIGH (ref 3.87–5.11)
RDW: 14.1 % (ref 11.5–15.5)
WBC: 11.2 10*3/uL — ABNORMAL HIGH (ref 4.0–10.5)
nRBC: 0 % (ref 0.0–0.2)

## 2020-10-19 LAB — COMPREHENSIVE METABOLIC PANEL
ALT: 24 U/L (ref 0–44)
AST: 25 U/L (ref 15–41)
Albumin: 4.2 g/dL (ref 3.5–5.0)
Alkaline Phosphatase: 85 U/L (ref 38–126)
Anion gap: 17 — ABNORMAL HIGH (ref 5–15)
BUN: 27 mg/dL — ABNORMAL HIGH (ref 8–23)
CO2: 28 mmol/L (ref 22–32)
Calcium: 11.3 mg/dL — ABNORMAL HIGH (ref 8.9–10.3)
Chloride: 95 mmol/L — ABNORMAL LOW (ref 98–111)
Creatinine, Ser: 1.46 mg/dL — ABNORMAL HIGH (ref 0.44–1.00)
GFR, Estimated: 34 mL/min — ABNORMAL LOW (ref >60)
Glucose, Bld: 122 mg/dL — ABNORMAL HIGH (ref 70–99)
Potassium: 4.3 mmol/L (ref 3.5–5.1)
Sodium: 140 mmol/L (ref 135–145)
Total Bilirubin: 1.2 mg/dL (ref 0.3–1.2)
Total Protein: 7.7 g/dL (ref 6.5–8.1)

## 2020-10-19 LAB — CBG MONITORING, ED: Glucose-Capillary: 124 mg/dL — ABNORMAL HIGH (ref 70–99)

## 2020-10-19 LAB — LIPASE, BLOOD: Lipase: 90 U/L — ABNORMAL HIGH (ref 11–51)

## 2020-10-19 MED ORDER — ONDANSETRON HCL 4 MG/2ML IJ SOLN
4.0000 mg | Freq: Four times a day (QID) | INTRAMUSCULAR | Status: DC | PRN
  Administered 2020-10-20: 4 mg via INTRAVENOUS
  Filled 2020-10-19: qty 2

## 2020-10-19 MED ORDER — IOHEXOL 300 MG/ML  SOLN
80.0000 mL | Freq: Once | INTRAMUSCULAR | Status: AC | PRN
  Administered 2020-10-19: 80 mL via INTRAVENOUS

## 2020-10-19 MED ORDER — SODIUM CHLORIDE 0.9 % IV BOLUS
1000.0000 mL | Freq: Once | INTRAVENOUS | Status: AC
  Administered 2020-10-19: 1000 mL via INTRAVENOUS

## 2020-10-19 MED ORDER — HYDROMORPHONE HCL 1 MG/ML IJ SOLN: 0.5000 mg | INTRAMUSCULAR | Status: DC | PRN

## 2020-10-19 MED ORDER — HEPARIN SODIUM (PORCINE) 5000 UNIT/ML IJ SOLN
5000.0000 [IU] | Freq: Three times a day (TID) | INTRAMUSCULAR | Status: DC
  Administered 2020-10-20 – 2020-10-23 (×11): 5000 [IU] via SUBCUTANEOUS
  Filled 2020-10-19 (×11): qty 1

## 2020-10-19 MED ORDER — INSULIN ASPART 100 UNIT/ML ~~LOC~~ SOLN
0.0000 [IU] | Freq: Three times a day (TID) | SUBCUTANEOUS | Status: DC
  Administered 2020-10-21: 2 [IU] via SUBCUTANEOUS
  Filled 2020-10-19: qty 0.09

## 2020-10-19 MED ORDER — ONDANSETRON HCL 4 MG PO TABS: 4.0000 mg | ORAL_TABLET | Freq: Four times a day (QID) | ORAL | Status: DC | PRN

## 2020-10-19 MED ORDER — LACTATED RINGERS IV SOLN: INTRAVENOUS | Status: DC

## 2020-10-19 MED ORDER — INSULIN ASPART 100 UNIT/ML ~~LOC~~ SOLN
0.0000 [IU] | Freq: Every day | SUBCUTANEOUS | Status: DC
  Filled 2020-10-19: qty 0.05

## 2020-10-19 NOTE — ED Triage Notes (Signed)
Per EMS- Patient is from home. Patient c/o generalized abdominal pain with slight distention and emesis. Patient has a history of bowel obstruction.

## 2020-10-19 NOTE — ED Provider Notes (Addendum)
[facility] DEPT Provider Note   CSN: [id number] Arrival date & time: 10/19/20  1120     History Chief Complaint  Patient presents with  . Abdominal Pain  . Emesis    [name] is a 87 y.o. female with a past medical history of prior DVT, hypertension, prior small bowel obstruction presenting to the ED with a chief complaint of abdominal pain.  Began having pain yesterday.  Today she had 3 episodes of nonbloody, nonbilious emesis.  She states that this feels similar to her prior small bowel obstructions.  She had a bowel movement yesterday but has not had one today, was unable to pass gas.  Has not taken anything for pain or vomiting.  No urinary symptoms.  No chest pain, shortness of breath, cough or fever.  States that her subsequent small bowel obstructions after her initial one improved with NG tube decompression.  HPI     Past Medical History:  Diagnosis Date  . Chronic kidney disease   . DVT (deep venous thrombosis) ([place])   . Dysphonia   . Factor V Leiden ([place])   . Hypertension   . Osteoporosis   . PNA (pneumonia) 12/31/2012  . Pneumonitis 03/19/2013  . SBO (small bowel obstruction) ([place]) 06/2018  . Small bowel obstruction [facility])     Patient Active Problem List   Diagnosis Date Noted  . Factor V Leiden ([place])   . Hyperlipidemia 03/19/2013  . Diabetes mellitus ([place]) 03/19/2013  . Anemia in chronic kidney disease(285.21) 02/14/2013  . Chronic kidney disease, unspecified 02/14/2013  . Secondary renovascular hypertension, benign 02/14/2013  . SBO (small bowel obstruction) ([place]) 12/31/2012  . HTN (hypertension) 12/31/2012  . Dehydration 12/31/2012    Past Surgical History:  Procedure Laterality Date  . ABDOMINAL HYSTERECTOMY    . APPENDECTOMY    . LAPAROTOMY N/A 01/01/2013   Procedure: EXPLORATORY LAPAROTOMY removal of smal bowel foreign body;  Surgeon: Madilyn Hook, DO;  Location: [facility] ORS;  Service: General;  Laterality: N/A;  .  TONSILLECTOMY       OB History   No obstetric history on file.     Family History  Family history unknown: Yes    Social History   Tobacco Use  . Smoking status: Former Smoker    Quit date: 05/04/1999    Years since quitting: 21.4  . Smokeless tobacco: Never Used  Vaping Use  . Vaping Use: Never used  Substance Use Topics  . Alcohol use: Yes    Comment: occasionally  . Drug use: No    Home Medications Prior to Admission medications   Medication Sig Start Date End Date Taking? Authorizing Provider  acetaminophen (TYLENOL) 325 MG tablet Take 2 tablets (650 mg total) by mouth every 4 (four) hours as needed. Patient taking differently: Take 650 mg by mouth every 4 (four) hours as needed for mild pain.  01/21/13   Whiteheart, Cristal Ford, NP  amLODipine (NORVASC) 10 MG tablet Take 10 mg by mouth daily.    [provider]  aspirin EC 81 MG tablet Take 81 mg by mouth at bedtime.     [provider]  calcium carbonate (TUMS - DOSED IN MG ELEMENTAL CALCIUM) 500 MG chewable tablet Chew 1-2 tablets (200-400 mg of elemental calcium total) by mouth 3 (three) times daily as needed. Patient taking differently: Chew 1-2 tablets by mouth 3 (three) times daily as needed for indigestion.  01/21/13   Whiteheart, Cristal Ford, NP  cholecalciferol (VITAMIN D)  1000 units tablet Take 1,000 Units by mouth at bedtime.    [provider]  DENTAGEL 1.1 % GEL dental gel Take 1 application by mouth daily. Place on toothbrush and brush daily for 2 minutes to prevent tooth decay/sensitivity 01/16/18   [provider]  ferrous sulfate 325 (65 FE) MG tablet Take 325 mg by mouth daily with breakfast. Unknown dose    [provider]  hydroxypropyl methylcellulose / hypromellose (ISOPTO TEARS / GONIOVISC) 2.5 % ophthalmic solution Place 1 drop into both eyes as needed for dry eyes.    [provider]  ipratropium (ATROVENT) 0.06 % nasal spray Place 1 spray into both  nostrils 3 (three) times daily. 04/13/18   [provider]  lactobacillus acidophilus (BACID) TABS tablet Take 1 tablet by mouth at bedtime. Uses [employer] brand     [provider]  loratadine (CLARITIN) 10 MG tablet Take 10 mg by mouth daily as needed for allergies.     [provider]  Multiple Vitamin (MULTIVITAMIN WITH MINERALS) TABS Take 1 tablet by mouth daily.    [provider]  olmesartan (BENICAR) 20 MG tablet Take 20 mg by mouth daily. 05/21/18   [provider]  psyllium (METAMUCIL) 58.6 % packet Take 1 packet by mouth at bedtime.     [provider]  rosuvastatin (CRESTOR) 5 MG tablet Take 5 mg by mouth at bedtime.     [provider]  senna (SENOKOT) 8.6 MG TABS tablet Take 1 tablet (8.6 mg total) by mouth at bedtime. 06/23/18   Bonnielee Haff, MD    Allergies    Diovan [valsartan], Hctz [hydrochlorothiazide], Tekamlo [aliskiren-amlodipine], and Valturna [aliskiren-valsartan]  Review of Systems   Review of Systems  Constitutional: Negative for appetite change, chills and fever.  HENT: Negative for ear pain, rhinorrhea, sneezing and sore throat.   Eyes: Negative for photophobia and visual disturbance.  Respiratory: Negative for cough, chest tightness, shortness of breath and wheezing.   Cardiovascular: Negative for chest pain and palpitations.  Gastrointestinal: Positive for abdominal pain, nausea and vomiting. Negative for blood in stool, constipation and diarrhea.  Genitourinary: Negative for dysuria, hematuria and urgency.  Musculoskeletal: Negative for myalgias.  Skin: Negative for rash.  Neurological: Negative for dizziness, weakness and light-headedness.    Physical Exam Updated Vital Signs BP (!) 121/94 (BP Location: Left Arm)   Pulse 88   Temp 97.8 F (36.6 C) (Oral)   Resp 17   SpO2 93%   Physical Exam Vitals and nursing note reviewed.  Constitutional:      General: She is not in acute  distress.    Appearance: She is well-developed and well-nourished.     Comments: Speaking complete sentences without difficulty.  No signs of respiratory distress  HENT:     Head: Normocephalic and atraumatic.     Nose: Nose normal.  Eyes:     General: No scleral icterus.       Left eye: No discharge.     Extraocular Movements: EOM normal.     Conjunctiva/sclera: Conjunctivae normal.  Cardiovascular:     Rate and Rhythm: Normal rate and regular rhythm.     Pulses: Intact distal pulses.     Heart sounds: Normal heart sounds. No murmur heard. No friction rub. No gallop.   Pulmonary:     Effort: Pulmonary effort is normal. No respiratory distress.     Breath sounds: Normal breath sounds.  Abdominal:     General: Bowel sounds are normal.  There is no distension.     Palpations: Abdomen is soft.     Tenderness: There is no abdominal tenderness. There is no guarding.     Comments: Abdomen appears slightly distended.  No focal tenderness noted.  There is some generalized tenderness.  Musculoskeletal:        General: No edema. Normal range of motion.     Cervical back: Normal range of motion and neck supple.  Skin:    General: Skin is warm and dry.     Findings: No rash.  Neurological:     Mental Status: She is alert.     Motor: No abnormal muscle tone.     Coordination: Coordination normal.  Psychiatric:        Mood and Affect: Mood and affect normal.     ED Results / Procedures / Treatments   Labs (all labs ordered are listed, but only abnormal results are displayed) Labs Reviewed  LIPASE, BLOOD - Abnormal; Notable for the following components:      Result Value   Lipase 90 (*)    All other components within normal limits  COMPREHENSIVE METABOLIC PANEL - Abnormal; Notable for the following components:   Chloride 95 (*)    Glucose, Bld 122 (*)    BUN 27 (*)    Creatinine, Ser 1.46 (*)    Calcium 11.3 (*)    GFR, Estimated 34 (*)    Anion gap 17 (*)    All other  components within normal limits  CBC - Abnormal; Notable for the following components:   WBC 11.2 (*)    RBC 5.31 (*)    Hemoglobin 16.2 (*)    HCT 50.1 (*)    All other components within normal limits  RESP PANEL BY RT-PCR (FLU A&B, COVID) ARPGX2  URINALYSIS, ROUTINE W REFLEX MICROSCOPIC    EKG None  Radiology CT ABDOMEN PELVIS W CONTRAST  Result Date: 10/19/2020 CLINICAL DATA:  Bowel obstruction suspected, increasing abdominal distension EXAM: CT ABDOMEN AND PELVIS WITH CONTRAST TECHNIQUE: Multidetector CT imaging of the abdomen and pelvis was performed using the standard protocol following bolus administration of intravenous contrast. CONTRAST:  31mL OMNIPAQUE IOHEXOL 300 MG/ML  SOLN COMPARISON:  Multiple priors, most recent CT 06/18/2018 FINDINGS: Lower chest: Grossly stable appearance of a 5 mm sub solid nodule in the right lower lobe (4/8). Few additional 2-3 mm subpleural micronodule seen in the right lung base, possibly post infectious or inflammatory with additional bandlike areas of scarring and architectural distortion in both lower lobes. Small pericardial effusion, similar to comparison exams. Coronary atherosclerosis is present. Cardiac size within normal limits. Hepatobiliary: Stable appearance of a calcified hepatic granuloma. Normal hepatic attenuation. Calcified, dependently layering gallstone in the otherwise normal gallbladder. Mild intra and extrahepatic biliary ductal dilatation with the common bile duct measuring up to 11 mm, slightly greater than expected for senescent change though similar to comparison imaging. No visible intraductal gallstones. Pancreas: Mild to moderate pancreatic atrophy. No concerning pancreatic lesion. No pancreatic ductal dilatation or surrounding inflammatory changes. Spleen: Normal in size. No concerning splenic lesions. Adrenals/Urinary Tract: Normal adrenal glands. Kidneys are normally located with symmetric enhancement and excretion. Multiple  fluid attenuation cysts present in both kidneys. Largest is a 7.1 cm exophytic cyst arising from the lower pole left kidney. No concerning renal lesions. Left extrarenal pelvis is similar to priors. No suspicious renal lesion, urolithiasis or hydronephrosis. No conspicuous bladder wall thickening or debris. Left posterolateral bladder diverticulum, similar to prior. Stomach/Bowel: Distal  esophagus and stomach are unremarkable. Duodenum is borderline distended and fluid-filled. There is some proximal segmental thickening of the jejunal loops in the upper abdomen as well with a questionable transition point in the mid abdomen (2/29) more distal small bowel is diffusely fluid-filled as well but of a more normal caliber. Appendix is not visualized. No focal inflammation the vicinity of the cecum to suggest an occult appendicitis. No colonic dilatation or wall thickening. Scattered colonic diverticula without focal inflammation to suggest diverticulitis. Vascular/Lymphatic: Atherosclerotic calcifications within the abdominal aorta and branch vessels. There is multilobulated fusiform infrarenal abdominal aortic dilatation measuring up to 3 cm in maximal diameter. No clear extension of dilatation beyond the aortic bifurcation. Appearance is similar to comparison exam. No acute luminal abnormality is seen. No suspicious or enlarged lymph nodes in the included lymphatic chains. Reproductive: Atrophic appearance of a retroverted uterus. Several small fluid attenuation cysts are seen in the left adnexa, not significantly changed from comparison exam. Additionally, a 4 cm fluid attenuation structure previously seen in the right adnexa now appears displaced posterolaterally towards the left adnexa and could reflect some rotation albeit without adjacent inflammation to suggest a torsion. Other: Abdominopelvic free air or fluid. No bowel containing hernias. Mild body wall edema. Musculoskeletal: Severe levocurvature of the lumbar  spine, apex L2. Multilevel discogenic and facet degenerative changes are present throughout the spine. Additional degenerative changes in the hips and pelvis. IMPRESSION: 1. There is dilatation of the proximal small bowel with areas of segmental thickening in the of the jejunal loops in the upper abdomen as well with a questionable transition point in the mid abdomen (2/29). More distal small bowel is diffusely fluid-filled as well as of a more normal caliber. Findings are suggestive of enteritis with a possible early or partial small bowel obstruction. 2. Cholelithiasis without evidence of acute cholecystitis. Mild intra and extrahepatic biliary ductal dilatation with the common bile duct measuring up to 11 mm, slightly greater than expected for senescent change though similar to comparison imaging. Correlate with liver serologies and consider further evaluation with MRCP if clinically warranted. This recommendation follows ACR consensus guidelines: White Paper of the ACR Incidental Findings Committee II on Gallbladder and Biliary Findings. J Am Coll Radiol 2013:;10:953-956. 3. A 4 cm fluid attenuation structure previously seen in the right adnexa now appears displaced posterolaterally towards the left adnexa and could reflect some rotation albeit without adjacent inflammation to suggest a torsion. If there is clinical concern could consider pelvic ultrasound for further interrogation. Otherwise at minimum, a follow-up ultrasound of the adnexal lesions in 6-12 months should be obtained note: This recommendation does not apply to premenarchal patients and to those with increased risk (genetic, family history, elevated tumor markers or other high-risk factors) of ovarian cancer. Reference: JACR 2020 Feb; 17(2):248-254 4. Stable fusiform infrarenal abdominal aortic dilatation measuring up to 3 cm in maximal diameter. Recommend follow-up ultrasound every 3 years. This recommendation follows ACR consensus guidelines:  White Paper of the ACR Incidental Findings Committee II on Vascular Findings. J Am Coll Radiol 2013JB:6262728. 5. Aortic Atherosclerosis (ICD10-I70.0). Electronically Signed   By: Lovena Le M.D.   On: 10/19/2020 22:42   DG Chest Portable 1 View  Result Date: 10/19/2020 CLINICAL DATA:  Abdominal pain and vomiting. EXAM: PORTABLE CHEST 1 VIEW COMPARISON:  April 16, 2018 FINDINGS: The heart size and mediastinal contours are within normal limits. There is marked severity calcification and tortuosity of the thoracic aorta. Both lungs are clear. The visualized skeletal structures  are unremarkable. IMPRESSION: No active cardiopulmonary disease. Electronically Signed   By: Virgina Norfolk M.D.   On: 10/19/2020 21:38    Procedures Procedures (including critical care time)  Medications Ordered in ED Medications  sodium chloride 0.9 % bolus 1,000 mL (1,000 mLs Intravenous New Bag/Given 10/19/20 2216)  iohexol (OMNIPAQUE) 300 MG/ML solution 80 mL (80 mLs Intravenous Contrast Given 10/19/20 2209)    ED Course  I have reviewed the triage vital signs and the nursing notes.  Pertinent labs & imaging results that were available during my care of the patient were reviewed by me and considered in my medical decision making (see chart for details).  Clinical Course as of 10/19/20 2249  Mon Oct 19, 2020  2104 Spoke to CT tech regarding reduced dose of IV contrast due to her AKI.  I have also ordered IV fluids.  I do not think she will be able to tolerate p.o. contrast [HK]  2129 DG Chest Portable 1 View No acute findings. [HK]    Clinical Course User Index [HK] Delia Heady, PA-C   MDM Rules/Calculators/A&P                          87 year old female with a past medical history of prior SBO presenting to the ED with a chief complaint of abdominal pain and vomiting.  She is concerned that she has another SBO.  Had a normal bowel bowel movement yesterday but did not have one today and is unable to pass  gas.  Had 3 episodes of nonbloody, nonbilious emesis today.  On exam abdomen is slightly distended and generally tender without rebound or guarding.  No focal tenderness.  She denies urinary symptoms.  Patient oxygen saturation is 93% on room air without any evidence of respiratory distress.  She does not wear supplemental oxygen at baseline.  Her lungs are clear to auscultation bilaterally.  Lab work shows slight leukocytosis of 11.2, AKI with a creatinine of 1.4, her baseline appears at 0.8.  Elevated anion gap of 17 and slight elevation in lipase to 90.  Will order CT of abdomen pelvis to assess for bowel obstruction versus other cause of her symptoms.  10:51 PM CT of abdomen pelvis shows findings consistent with enteritis and possible partial or early bowel obstruction.  There is also evidence of cholelithiasis with with dilatation of the common bile duct to 11 mm.  On recheck patient has no specific right upper quadrant pain and did not have any focal pain prior either.  Her LFTs are within normal limits.  She will require admission to hospitalist service for ongoing management of this apparent partial small bowel obstruction which I feel is the cause of her symptoms today.  Patient remains hemodynamically stable.  States that her pain has improved.  11:07 PM  Spoke to Dr. Donne Hazel, on-call general surgeon.  Reviewed patient CT scan states that it is concerning for enteritis but due to her symptoms will also treat for bowel obstruction.  Will place NG tube and continue IV fluids.  Hospitalist, Dr. Jonelle Sidle to admit to   Portions of this note were generated with Dragon dictation software. Dictation errors may occur despite best attempts at proofreading.  Final Clinical Impression(s) / ED Diagnoses Final diagnoses:  Small bowel obstruction ([place])  Biliary calculus of other site without obstruction    Rx / DC Orders ED Discharge Orders    None        Merritt Mccravy,  PA-C 10/19/20 2308     Gareth Morgan, MD 10/20/20 [phone]

## 2020-10-19 NOTE — ED Triage Notes (Signed)
Per pt, has a history of SBO-vomited 3 times today, increased abdominal distention, last BM was yesterday

## 2020-10-19 NOTE — H&P (Signed)
History and Physical   LAKE BREEDING TGG:[id number] DOB: 1929/04/03 DOA: 10/19/2020  Referring MD/NP/PA: Dr. Billy Fischer  PCP: Carol Ada, MD   Patient coming from: Home  Chief Complaint: Abdominal pain with nausea and vomiting  HPI: [name] is a 87 y.o. female with medical history significant of recurrent small bowel obstruction, factor V Leiden with previous DVT, diabetes, hypertension, hyperlipidemia, chronic kidney disease stage II who has had previous abdominal surgeries presenting to the ER with nausea vomiting abdominal pain.  Symptoms started about 2 days ago and got worse yesterday.  She had 3 episodes of vomiting today.  Continues to be nauseous.  No fever or chills no hematemesis no bright red blood per rectum.  Symptoms appear similar to her previous small bowel obstructions.  Patient seen in the ER evaluated and CT abdomen pelvis confirmed partial small bowel obstruction.  Dr. Donne Hazel of general surgery consulted and patient being admitted to the hospital for evaluation and treatment..  ED Course: Temperature 98.7 blood pressure 146/73 pulse 105, respiratory rate of 19 oxygen sat 90% on room air.  White count is 11.2 hemoglobin 16.2 and platelets 160.  Chemistry showed BUN 27 creatinine 1.46 and calcium 11.3.  Glucose 122.  CT abdomen pelvis showed cholelithiasis with gallstones as well as questionable small bowel obstruction with transition point in mid abdomen.  Surgery consulted and patient being admitted for further evaluation and treatment.  Review of Systems: As per HPI otherwise 10 point review of systems negative.    Past Medical History:  Diagnosis Date  . Chronic kidney disease   . DVT (deep venous thrombosis) ([place])   . Dysphonia   . Factor V Leiden ([place])   . Hypertension   . Osteoporosis   . PNA (pneumonia) 12/31/2012  . Pneumonitis 03/19/2013  . SBO (small bowel obstruction) ([place]) 06/2018  . Small bowel obstruction [facility])     Past Surgical  History:  Procedure Laterality Date  . ABDOMINAL HYSTERECTOMY    . APPENDECTOMY    . LAPAROTOMY N/A 01/01/2013   Procedure: EXPLORATORY LAPAROTOMY removal of smal bowel foreign body;  Surgeon: Madilyn Hook, DO;  Location: [facility] ORS;  Service: General;  Laterality: N/A;  . TONSILLECTOMY       reports that she quit smoking about 21 years ago. She has never used smokeless tobacco. She reports current alcohol use. She reports that she does not use drugs.  Allergies  Allergen Reactions  . Diovan [Valsartan] Other (See Comments)    gas  . Hctz [Hydrochlorothiazide]     gas  . Tekamlo [Aliskiren-Amlodipine]     gas  . Valturna [Aliskiren-Valsartan]     Gas     Family History  Family history unknown: Yes     Prior to Admission medications   Medication Sig Start Date End Date Taking? Authorizing Provider  acetaminophen (TYLENOL) 325 MG tablet Take 2 tablets (650 mg total) by mouth every 4 (four) hours as needed. Patient taking differently: Take 650 mg by mouth every 4 (four) hours as needed for mild pain.  01/21/13   Whiteheart, Cristal Ford, NP  amLODipine (NORVASC) 10 MG tablet Take 10 mg by mouth daily.    [provider]  aspirin EC 81 MG tablet Take 81 mg by mouth at bedtime.     [provider]  calcium carbonate (TUMS - DOSED IN MG ELEMENTAL CALCIUM) 500 MG chewable tablet Chew 1-2 tablets (200-400 mg of elemental calcium total) by mouth 3 (three) times daily as  needed. Patient taking differently: Chew 1-2 tablets by mouth 3 (three) times daily as needed for indigestion.  01/21/13   Whiteheart, Cristal Ford, NP  cholecalciferol (VITAMIN D) 1000 units tablet Take 1,000 Units by mouth at bedtime.    [provider]  DENTAGEL 1.1 % GEL dental gel Take 1 application by mouth daily. Place on toothbrush and brush daily for 2 minutes to prevent tooth decay/sensitivity 01/16/18   [provider]  ferrous sulfate 325 (65 FE) MG tablet Take 325 mg by mouth daily with  breakfast. Unknown dose    [provider]  hydroxypropyl methylcellulose / hypromellose (ISOPTO TEARS / GONIOVISC) 2.5 % ophthalmic solution Place 1 drop into both eyes as needed for dry eyes.    [provider]  ipratropium (ATROVENT) 0.06 % nasal spray Place 1 spray into both nostrils 3 (three) times daily. 04/13/18   [provider]  lactobacillus acidophilus (BACID) TABS tablet Take 1 tablet by mouth at bedtime. Uses [employer] brand     [provider]  loratadine (CLARITIN) 10 MG tablet Take 10 mg by mouth daily as needed for allergies.     [provider]  Multiple Vitamin (MULTIVITAMIN WITH MINERALS) TABS Take 1 tablet by mouth daily.    [provider]  olmesartan (BENICAR) 20 MG tablet Take 20 mg by mouth daily. 05/21/18   [provider]  psyllium (METAMUCIL) 58.6 % packet Take 1 packet by mouth at bedtime.     [provider]  rosuvastatin (CRESTOR) 5 MG tablet Take 5 mg by mouth at bedtime.     [provider]  senna (SENOKOT) 8.6 MG TABS tablet Take 1 tablet (8.6 mg total) by mouth at bedtime. 06/23/18   Bonnielee Haff, MD    Physical Exam: Vitals:   10/19/20 1901 10/19/20 2100 10/19/20 2115 10/19/20 2230  BP: 115/68 (!) 146/73 (!) 131/96 (!) 121/94  Pulse: (!) 105 93 93 88  Resp: 16 19 16 17   Temp: 97.8 F (36.6 C)     TempSrc: Oral     SpO2: 91% 93% 92% 93%      Constitutional: NAD, calm, comfortable Vitals:   10/19/20 1901 10/19/20 2100 10/19/20 2115 10/19/20 2230  BP: 115/68 (!) 146/73 (!) 131/96 (!) 121/94  Pulse: (!) 105 93 93 88  Resp: 16 19 16 17   Temp: 97.8 F (36.6 C)     TempSrc: Oral     SpO2: 91% 93% 92% 93%   Eyes: PERRL, lids and conjunctivae normal ENMT: Mucous membranes are moist. Posterior pharynx clear of any exudate or lesions.Normal dentition.  Neck: normal, supple, no masses, no thyromegaly Respiratory: clear to auscultation bilaterally, no wheezing, no crackles.  Normal respiratory effort. No accessory muscle use.  Cardiovascular: Regular rate and rhythm, no murmurs / rubs / gallops. No extremity edema. 2+ pedal pulses. No carotid bruits.  Abdomen: no tenderness, no masses palpated. No hepatosplenomegaly. Bowel sounds positive.  Musculoskeletal: no clubbing / cyanosis. No joint deformity upper and lower extremities. Good ROM, no contractures. Normal muscle tone.  Skin: no rashes, lesions, ulcers. No induration Neurologic: CN 2-12 grossly intact. Sensation intact, DTR normal. Strength 5/5 in all 4.  Psychiatric: Normal judgment and insight. Alert and oriented x 3. Normal mood.     Labs on Admission: I have personally reviewed following labs and imaging studies  CBC: Recent Labs  Lab 10/19/20 1415  WBC 11.2*  HGB 16.2*  HCT 50.1*  MCV 94.4  PLT 160   Basic  Metabolic Panel: Recent Labs  Lab 10/19/20 1415  NA 140  K 4.3  CL 95*  CO2 28  GLUCOSE 122*  BUN 27*  CREATININE 1.46*  CALCIUM 11.3*   GFR: CrCl cannot be calculated (Unknown ideal weight.). Liver Function Tests: Recent Labs  Lab 10/19/20 1415  AST 25  ALT 24  ALKPHOS 85  BILITOT 1.2  PROT 7.7  ALBUMIN 4.2   Recent Labs  Lab 10/19/20 1415  LIPASE 90*   No results for input(s): AMMONIA in the last 168 hours. Coagulation Profile: No results for input(s): INR, PROTIME in the last 168 hours. Cardiac Enzymes: No results for input(s): CKTOTAL, CKMB, CKMBINDEX, TROPONINI in the last 168 hours. BNP (last 3 results) No results for input(s): PROBNP in the last 8760 hours. HbA1C: No results for input(s): HGBA1C in the last 72 hours. CBG: No results for input(s): GLUCAP in the last 168 hours. Lipid Profile: No results for input(s): CHOL, HDL, LDLCALC, TRIG, CHOLHDL, LDLDIRECT in the last 72 hours. Thyroid Function Tests: No results for input(s): TSH, T4TOTAL, FREET4, T3FREE, THYROIDAB in the last 72 hours. Anemia Panel: No results for input(s): VITAMINB12, FOLATE,  FERRITIN, TIBC, IRON, RETICCTPCT in the last 72 hours. Urine analysis:    Component Value Date/Time   COLORURINE YELLOW 06/18/2018 0900   APPEARANCEUR CLEAR 06/18/2018 0900   LABSPEC 1.044 (H) 06/18/2018 0900   PHURINE 5.0 06/18/2018 0900   GLUCOSEU NEGATIVE 06/18/2018 0900   HGBUR NEGATIVE 06/18/2018 0900   BILIRUBINUR NEGATIVE 06/18/2018 0900   KETONESUR NEGATIVE 06/18/2018 0900   PROTEINUR NEGATIVE 06/18/2018 0900   UROBILINOGEN 0.2 04/06/2014 0715   NITRITE NEGATIVE 06/18/2018 0900   LEUKOCYTESUR NEGATIVE 06/18/2018 0900   Sepsis Labs: @LABRCNTIP (procalcitonin:4,lacticidven:4) )No results found for this or any previous visit (from the past 240 hour(s)).   Radiological Exams on Admission: CT ABDOMEN PELVIS W CONTRAST  Result Date: 10/19/2020 CLINICAL DATA:  Bowel obstruction suspected, increasing abdominal distension EXAM: CT ABDOMEN AND PELVIS WITH CONTRAST TECHNIQUE: Multidetector CT imaging of the abdomen and pelvis was performed using the standard protocol following bolus administration of intravenous contrast. CONTRAST:  15mL OMNIPAQUE IOHEXOL 300 MG/ML  SOLN COMPARISON:  Multiple priors, most recent CT 06/18/2018 FINDINGS: Lower chest: Grossly stable appearance of a 5 mm sub solid nodule in the right lower lobe (4/8). Few additional 2-3 mm subpleural micronodule seen in the right lung base, possibly post infectious or inflammatory with additional bandlike areas of scarring and architectural distortion in both lower lobes. Small pericardial effusion, similar to comparison exams. Coronary atherosclerosis is present. Cardiac size within normal limits. Hepatobiliary: Stable appearance of a calcified hepatic granuloma. Normal hepatic attenuation. Calcified, dependently layering gallstone in the otherwise normal gallbladder. Mild intra and extrahepatic biliary ductal dilatation with the common bile duct measuring up to 11 mm, slightly greater than expected for senescent change though  similar to comparison imaging. No visible intraductal gallstones. Pancreas: Mild to moderate pancreatic atrophy. No concerning pancreatic lesion. No pancreatic ductal dilatation or surrounding inflammatory changes. Spleen: Normal in size. No concerning splenic lesions. Adrenals/Urinary Tract: Normal adrenal glands. Kidneys are normally located with symmetric enhancement and excretion. Multiple fluid attenuation cysts present in both kidneys. Largest is a 7.1 cm exophytic cyst arising from the lower pole left kidney. No concerning renal lesions. Left extrarenal pelvis is similar to priors. No suspicious renal lesion, urolithiasis or hydronephrosis. No conspicuous bladder wall thickening or debris. Left posterolateral bladder diverticulum, similar to prior. Stomach/Bowel: Distal esophagus and stomach are unremarkable. Duodenum is borderline  distended and fluid-filled. There is some proximal segmental thickening of the jejunal loops in the upper abdomen as well with a questionable transition point in the mid abdomen (2/29) more distal small bowel is diffusely fluid-filled as well but of a more normal caliber. Appendix is not visualized. No focal inflammation the vicinity of the cecum to suggest an occult appendicitis. No colonic dilatation or wall thickening. Scattered colonic diverticula without focal inflammation to suggest diverticulitis. Vascular/Lymphatic: Atherosclerotic calcifications within the abdominal aorta and branch vessels. There is multilobulated fusiform infrarenal abdominal aortic dilatation measuring up to 3 cm in maximal diameter. No clear extension of dilatation beyond the aortic bifurcation. Appearance is similar to comparison exam. No acute luminal abnormality is seen. No suspicious or enlarged lymph nodes in the included lymphatic chains. Reproductive: Atrophic appearance of a retroverted uterus. Several small fluid attenuation cysts are seen in the left adnexa, not significantly changed from  comparison exam. Additionally, a 4 cm fluid attenuation structure previously seen in the right adnexa now appears displaced posterolaterally towards the left adnexa and could reflect some rotation albeit without adjacent inflammation to suggest a torsion. Other: Abdominopelvic free air or fluid. No bowel containing hernias. Mild body wall edema. Musculoskeletal: Severe levocurvature of the lumbar spine, apex L2. Multilevel discogenic and facet degenerative changes are present throughout the spine. Additional degenerative changes in the hips and pelvis. IMPRESSION: 1. There is dilatation of the proximal small bowel with areas of segmental thickening in the of the jejunal loops in the upper abdomen as well with a questionable transition point in the mid abdomen (2/29). More distal small bowel is diffusely fluid-filled as well as of a more normal caliber. Findings are suggestive of enteritis with a possible early or partial small bowel obstruction. 2. Cholelithiasis without evidence of acute cholecystitis. Mild intra and extrahepatic biliary ductal dilatation with the common bile duct measuring up to 11 mm, slightly greater than expected for senescent change though similar to comparison imaging. Correlate with liver serologies and consider further evaluation with MRCP if clinically warranted. This recommendation follows ACR consensus guidelines: White Paper of the ACR Incidental Findings Committee II on Gallbladder and Biliary Findings. J Am Coll Radiol 2013:;10:953-956. 3. A 4 cm fluid attenuation structure previously seen in the right adnexa now appears displaced posterolaterally towards the left adnexa and could reflect some rotation albeit without adjacent inflammation to suggest a torsion. If there is clinical concern could consider pelvic ultrasound for further interrogation. Otherwise at minimum, a follow-up ultrasound of the adnexal lesions in 6-12 months should be obtained note: This recommendation does not  apply to premenarchal patients and to those with increased risk (genetic, family history, elevated tumor markers or other high-risk factors) of ovarian cancer. Reference: JACR 2020 Feb; 17(2):248-254 4. Stable fusiform infrarenal abdominal aortic dilatation measuring up to 3 cm in maximal diameter. Recommend follow-up ultrasound every 3 years. This recommendation follows ACR consensus guidelines: White Paper of the ACR Incidental Findings Committee II on Vascular Findings. J Am Coll Radiol 2013JB:6262728. 5. Aortic Atherosclerosis (ICD10-I70.0). Electronically Signed   By: Lovena Le M.D.   On: 10/19/2020 22:42   DG Chest Portable 1 View  Result Date: 10/19/2020 CLINICAL DATA:  Abdominal pain and vomiting. EXAM: PORTABLE CHEST 1 VIEW COMPARISON:  April 16, 2018 FINDINGS: The heart size and mediastinal contours are within normal limits. There is marked severity calcification and tortuosity of the thoracic aorta. Both lungs are clear. The visualized skeletal structures are unremarkable. IMPRESSION: No active cardiopulmonary disease. Electronically  Signed   By: Virgina Norfolk M.D.   On: 10/19/2020 21:38      Assessment/Plan Principal Problem:   Partial small bowel obstruction (HCC) Active Problems:   HTN (hypertension)   Dehydration   Anemia in chronic kidney disease   Hyperlipidemia   Diabetes mellitus ([place])   Factor V Leiden ([place])   Hypercalcemia     #1 partial small bowel obstruction: Patient will be admitted for consult management initiated.  NG tube, IV fluids, pain management, supportive care.  Depending on response we will transition to remove NG tube down the road and advance diet.  Surgery is on board and will follow along  #2 essential hypertension: Hold oral medications as patient will be n.p.o. for now.  IV metoprolol as needed.  #3 diabetes: Sliding scale insulin based on presentation.  #4 significant dehydration: Monitor, hydrate aggressively.  #5 hypercalcemia:  Hydrate and monitor calcium level.  Patient's calcium was normal previously.  If no improvement will work-up for possible source.  #6 hyperlipidemia: Hold statin for now.  #7 anemia of chronic disease: Monitor H&H.   DVT prophylaxis: Heparin Code Status: Full code Family Communication: No family bedside Disposition Plan: To be determined Consults called: Dr. Donne Hazel general surgery Admission status: Inpatient  Severity of Illness: The appropriate patient status for this patient is INPATIENT. Inpatient status is judged to be reasonable and necessary in order to provide the required intensity of service to ensure the patient's safety. The patient's presenting symptoms, physical exam findings, and initial radiographic and laboratory data in the context of their chronic comorbidities is felt to place them at high risk for further clinical deterioration. Furthermore, it is not anticipated that the patient will be medically stable for discharge from the hospital within 2 midnights of admission. The following factors support the patient status of inpatient.   " The patient's presenting symptoms include abdominal pain nausea vomiting. " The worrisome physical exam findings include abdominal distention . " The initial radiographic and laboratory data are worrisome because of CT evidence of small bowel obstruction. " The chronic co-morbidities include small bowel obstruction.   * I certify that at the point of admission it is my clinical judgment that the patient will require inpatient hospital care spanning beyond 2 midnights from the point of admission due to high intensity of service, high risk for further deterioration and high frequency of surveillance required.Heather Merino MD [facility] Pager [phone]  If 7PM-7AM, please contact night-coverage [email or web address] Password [email or web address]  10/19/2020, 11:13 PM

## 2020-10-20 ENCOUNTER — Inpatient Hospital Stay (HOSPITAL_COMMUNITY): Payer: PPO

## 2020-10-20 ENCOUNTER — Other Ambulatory Visit: Payer: Self-pay

## 2020-10-20 DIAGNOSIS — E860 Dehydration: Secondary | ICD-10-CM

## 2020-10-20 DIAGNOSIS — N179 Acute kidney failure, unspecified: Secondary | ICD-10-CM

## 2020-10-20 LAB — CBC
HCT: 46.7 % — ABNORMAL HIGH (ref 36.0–46.0)
Hemoglobin: 14.4 g/dL (ref 12.0–15.0)
MCH: 30.2 pg (ref 26.0–34.0)
MCHC: 30.8 g/dL (ref 30.0–36.0)
MCV: 97.9 fL (ref 80.0–100.0)
Platelets: 138 10*3/uL — ABNORMAL LOW (ref 150–400)
RBC: 4.77 MIL/uL (ref 3.87–5.11)
RDW: 14.3 % (ref 11.5–15.5)
WBC: 8.5 10*3/uL (ref 4.0–10.5)
nRBC: 0 % (ref 0.0–0.2)

## 2020-10-20 LAB — URINALYSIS, ROUTINE W REFLEX MICROSCOPIC
Bilirubin Urine: NEGATIVE
Glucose, UA: NEGATIVE mg/dL
Hgb urine dipstick: NEGATIVE
Ketones, ur: 5 mg/dL — AB
Nitrite: POSITIVE — AB
Protein, ur: 30 mg/dL — AB
Specific Gravity, Urine: 1.041 — ABNORMAL HIGH (ref 1.005–1.030)
WBC, UA: >50 WBC/hpf — ABNORMAL HIGH (ref 0–5)
pH: 5 (ref 5.0–8.0)

## 2020-10-20 LAB — COMPREHENSIVE METABOLIC PANEL
ALT: 18 U/L (ref 0–44)
AST: 20 U/L (ref 15–41)
Albumin: 3.4 g/dL — ABNORMAL LOW (ref 3.5–5.0)
Alkaline Phosphatase: 65 U/L (ref 38–126)
Anion gap: 11 (ref 5–15)
BUN: 40 mg/dL — ABNORMAL HIGH (ref 8–23)
CO2: 27 mmol/L (ref 22–32)
Calcium: 9.6 mg/dL (ref 8.9–10.3)
Chloride: 100 mmol/L (ref 98–111)
Creatinine, Ser: 1.41 mg/dL — ABNORMAL HIGH (ref 0.44–1.00)
GFR, Estimated: 36 mL/min — ABNORMAL LOW (ref >60)
Glucose, Bld: 134 mg/dL — ABNORMAL HIGH (ref 70–99)
Potassium: 4.7 mmol/L (ref 3.5–5.1)
Sodium: 138 mmol/L (ref 135–145)
Total Bilirubin: 1.2 mg/dL (ref 0.3–1.2)
Total Protein: 6.2 g/dL — ABNORMAL LOW (ref 6.5–8.1)

## 2020-10-20 LAB — HEMOGLOBIN A1C
Hgb A1c MFr Bld: 5.5 % (ref 4.8–5.6)
Mean Plasma Glucose: 111.15 mg/dL

## 2020-10-20 LAB — CBG MONITORING, ED: Glucose-Capillary: 103 mg/dL — ABNORMAL HIGH (ref 70–99)

## 2020-10-20 LAB — GLUCOSE, CAPILLARY
Glucose-Capillary: 185 mg/dL — ABNORMAL HIGH (ref 70–99)
Glucose-Capillary: 58 mg/dL — ABNORMAL LOW (ref 70–99)
Glucose-Capillary: 67 mg/dL — ABNORMAL LOW (ref 70–99)
Glucose-Capillary: 92 mg/dL (ref 70–99)
Glucose-Capillary: 94 mg/dL (ref 70–99)

## 2020-10-20 LAB — RESP PANEL BY RT-PCR (FLU A&B, COVID) ARPGX2
Influenza A by PCR: NEGATIVE
Influenza B by PCR: NEGATIVE
SARS Coronavirus 2 by RT PCR: NEGATIVE

## 2020-10-20 MED ORDER — DEXTROSE 50 % IV SOLN
INTRAVENOUS | Status: AC
  Administered 2020-10-20: 50 mL
  Filled 2020-10-20: qty 50

## 2020-10-20 MED ORDER — DIATRIZOATE MEGLUMINE & SODIUM 66-10 % PO SOLN
90.0000 mL | Freq: Once | ORAL | Status: AC
  Administered 2020-10-20: 13:00:00 90 mL via NASOGASTRIC
  Filled 2020-10-20 (×2): qty 90

## 2020-10-20 NOTE — Progress Notes (Signed)
Called ED for report. Per sectary the nurse will have to call later

## 2020-10-20 NOTE — ED Notes (Signed)
Patient had a small bowel movement.

## 2020-10-20 NOTE — Progress Notes (Addendum)
PROGRESS NOTE    [name]    Code Status: Full Code  OEV:[id number] DOB: 11/03/1928 DOA: 10/19/2020 LOS: 1 days  PCP: Carol Ada, MD CC:  Chief Complaint  Patient presents with  . Abdominal Pain  . Emesis       Hospital Summary   This is an 87 year old female with past medical history of recurrent SBO, Dr. 5 Leiden, DVT, diabetes, hypertension, hyperlipidemia, CKD 2, prior abdominal surgeries who presented to the ED with nausea, vomiting and abdominal pain since midnight on Monday.  ED Course: Temperature 98.7 blood pressure 146/73 pulse 105, respiratory rate of 19 oxygen sat 90% on room air.  White count is 11.2 hemoglobin 16.2 and platelets 160.  Chemistry showed BUN 27 creatinine 1.46 and calcium 11.3.  Glucose 122.  CT abdomen pelvis showed cholelithiasis with gallstones as well as questionable small bowel obstruction with transition point in mid abdomen. Surgery was consulted in the ED.  NG tube was placed and she was made NPO.  A & P   Principal Problem:   Partial small bowel obstruction (HCC) Active Problems:   HTN (hypertension)   Dehydration   Anemia in chronic kidney disease   Hyperlipidemia   Diabetes mellitus (HCC)   Factor V Leiden (HCC)   Hypercalcemia   1. Partial SBO with history of appendectomy, abdominal hysterectomy, ex lap for removal of foreign body in 2014, improving a. Surgery consulted, no indication for surgery at this time b. Had BM this morning c. Continue n.p.o. and NG tube d. Replete electrolytes as needed e. Mobilize for bowel function  2. Hypertension a. IV metoprolol as needed  3. Diabetes a. Continue sliding scale  4. AKI  dehydration a. Follow up after IV fluids  5. Acute hypoxic respiratory failure, unknown etiology a. Patient desats to mid 80s on room air, 92% on 2L/min b. Has been aggressively hydrated in the setting of HFpEF diagnosed in 2016 c. Reduce the rate of IV fluids from 125->75 for now pending  CXR  6. Hypercalcemia a. Calcium 11.3 on presentation, normalized after IV fluids  7. Hyperlipidemia a. Holding statin  8. Anemia of chronic disease a. Monitor H&H   DVT prophylaxis: heparin injection 5,000 Units Start: 10/19/20 2315   Family Communication: Patient updated at bedside  Disposition Plan:  Status is: Inpatient  Remains inpatient appropriate because:IV treatments appropriate due to intensity of illness or inability to take PO   Dispo: The patient is from: Home              Anticipated d/c is to: Home              Anticipated d/c date is: 2 days              Patient currently is not medically stable to d/c.           Pressure injury documentation    None  Consultants  General surgery   Procedures  None  Antibiotics   Anti-infectives (From admission, onward)   None        Subjective   Patient seen and examined at bedside in no acute distress and resting comfortably.  Admits to BM this a.m. and states she is feeling better since first presenting to the ED.  No complaints of abdominal pain or further nausea or vomiting.  No other complaints.  Objective   Vitals:   10/20/20 0530 10/20/20 0830 10/20/20 1048 10/20/20 1348  BP: (!) 143/61 138/63 90/80 (!) 160/64  Pulse: 88 88 75 84  Resp: 17 18 18 18   Temp:   97.7 F (36.5 C) 98.6 F (37 C)  TempSrc:   Oral Oral  SpO2: 92% 97% 97% (!) 84%    Intake/Output Summary (Last 24 hours) at 10/20/2020 1742 Last data filed at 10/20/2020 1400 Gross per 24 hour  Intake 380.25 ml  Output 0 ml  Net 380.25 ml   There were no vitals filed for this visit.  Examination:  Physical Exam Vitals and nursing note reviewed.  Constitutional:      Appearance: Normal appearance.  HENT:     Head: Normocephalic.     Comments: NG tube    Mouth/Throat:     Mouth: Mucous membranes are dry.  Eyes:     Conjunctiva/sclera: Conjunctivae normal.  Cardiovascular:     Rate and Rhythm: Normal rate and  regular rhythm.  Pulmonary:     Effort: Pulmonary effort is normal.     Breath sounds: Normal breath sounds.  Abdominal:     General: Abdomen is flat.     Palpations: Abdomen is soft.  Musculoskeletal:        General: No swelling or tenderness.  Skin:    Coloration: Skin is not jaundiced or pale.  Neurological:     Mental Status: She is alert. Mental status is at baseline.  Psychiatric:        Mood and Affect: Mood normal.        Behavior: Behavior normal.     Data Reviewed: I have personally reviewed following labs and imaging studies  CBC: Recent Labs  Lab 10/19/20 1415 10/20/20 0440  WBC 11.2* 8.5  HGB 16.2* 14.4  HCT 50.1* 46.7*  MCV 94.4 97.9  PLT 160 0000000*   Basic Metabolic Panel: Recent Labs  Lab 10/19/20 1415 10/20/20 0440  NA 140 138  K 4.3 4.7  CL 95* 100  CO2 28 27  GLUCOSE 122* 134*  BUN 27* 40*  CREATININE 1.46* 1.41*  CALCIUM 11.3* 9.6   GFR: CrCl cannot be calculated (Unknown ideal weight.). Liver Function Tests: Recent Labs  Lab 10/19/20 1415 10/20/20 0440  AST 25 20  ALT 24 18  ALKPHOS 85 65  BILITOT 1.2 1.2  PROT 7.7 6.2*  ALBUMIN 4.2 3.4*   Recent Labs  Lab 10/19/20 1415  LIPASE 90*   No results for input(s): AMMONIA in the last 168 hours. Coagulation Profile: No results for input(s): INR, PROTIME in the last 168 hours. Cardiac Enzymes: No results for input(s): CKTOTAL, CKMB, CKMBINDEX, TROPONINI in the last 168 hours. BNP (last 3 results) No results for input(s): PROBNP in the last 8760 hours. HbA1C: Recent Labs    10/19/20 1415  HGBA1C 5.5   CBG: Recent Labs  Lab 10/19/20 2349 10/20/20 0742 10/20/20 1140 10/20/20 1649  GLUCAP 124* 103* 92 94   Lipid Profile: No results for input(s): CHOL, HDL, LDLCALC, TRIG, CHOLHDL, LDLDIRECT in the last 72 hours. Thyroid Function Tests: No results for input(s): TSH, T4TOTAL, FREET4, T3FREE, THYROIDAB in the last 72 hours. Anemia Panel: No results for input(s):  VITAMINB12, FOLATE, FERRITIN, TIBC, IRON, RETICCTPCT in the last 72 hours. Sepsis Labs: No results for input(s): PROCALCITON, LATICACIDVEN in the last 168 hours.  Recent Results (from the past 240 hour(s))  Resp Panel by RT-PCR (Flu A&B, Covid) Nasopharyngeal Swab     Status: None   Collection Time: 10/19/20 10:49 PM   Specimen: Nasopharyngeal Swab; Nasopharyngeal(NP) swabs in vial transport medium  Result Value Ref  Range Status   SARS Coronavirus 2 by RT PCR NEGATIVE NEGATIVE Final    Comment: (NOTE) SARS-CoV-2 target nucleic acids are NOT DETECTED.  The SARS-CoV-2 RNA is generally detectable in upper respiratory specimens during the acute phase of infection. The lowest concentration of SARS-CoV-2 viral copies this assay can detect is 138 copies/mL. A negative result does not preclude SARS-Cov-2 infection and should not be used as the sole basis for treatment or other patient management decisions. A negative result may occur with  improper specimen collection/handling, submission of specimen other than nasopharyngeal swab, presence of viral mutation(s) within the areas targeted by this assay, and inadequate number of viral copies(<138 copies/mL). A negative result must be combined with clinical observations, patient history, and epidemiological information. The expected result is Negative.  Fact Sheet for Patients:  EntrepreneurPulse.com.au  Fact Sheet for Healthcare Providers:  IncredibleEmployment.be  This test is no t yet approved or cleared by the [place] FDA and  has been authorized for detection and/or diagnosis of SARS-CoV-2 by FDA under an Emergency Use Authorization (EUA). This EUA will remain  in effect (meaning this test can be used) for the duration of the COVID-19 declaration under Section 564(b)(1) of the Act, 21 U.S.C.section 360bbb-3(b)(1), unless the authorization is terminated  or revoked sooner.       Influenza A  by PCR NEGATIVE NEGATIVE Final   Influenza B by PCR NEGATIVE NEGATIVE Final    Comment: (NOTE) The Xpert Xpress SARS-CoV-2/FLU/RSV plus assay is intended as an aid in the diagnosis of influenza from Nasopharyngeal swab specimens and should not be used as a sole basis for treatment. Nasal washings and aspirates are unacceptable for Xpert Xpress SARS-CoV-2/FLU/RSV testing.  Fact Sheet for Patients: EntrepreneurPulse.com.au  Fact Sheet for Healthcare Providers: IncredibleEmployment.be  This test is not yet approved or cleared by the [place] FDA and has been authorized for detection and/or diagnosis of SARS-CoV-2 by FDA under an Emergency Use Authorization (EUA). This EUA will remain in effect (meaning this test can be used) for the duration of the COVID-19 declaration under Section 564(b)(1) of the Act, 21 U.S.C. section 360bbb-3(b)(1), unless the authorization is terminated or revoked.  Performed at [facility], [place] [address] [place],  [address]          Radiology Studies: DG Abdomen 1 View  Result Date: 10/20/2020 CLINICAL DATA:  NG tube EXAM: ABDOMEN - 1 VIEW COMPARISON:  June 21, 2018 FINDINGS: The enteric tube projects over the gastric body. The tip is pointed distally. There is contrast within the left renal collecting system with apparent mild left-sided hydronephrosis which is new since prior study. The bladder is distended. The bowel gas pattern is nonspecific. IMPRESSION: 1. Enteric tube projects over the gastric body. 2. New mild left-sided hydronephrosis.  Distended urinary bladder. 3. Nonspecific bowel gas pattern. Electronically Signed   By: Constance Holster M.D.   On: 10/20/2020 02:05   CT ABDOMEN PELVIS W CONTRAST  Result Date: 10/19/2020 CLINICAL DATA:  Bowel obstruction suspected, increasing abdominal distension EXAM: CT ABDOMEN AND PELVIS WITH CONTRAST TECHNIQUE: Multidetector CT  imaging of the abdomen and pelvis was performed using the standard protocol following bolus administration of intravenous contrast. CONTRAST:  40mL OMNIPAQUE IOHEXOL 300 MG/ML  SOLN COMPARISON:  Multiple priors, most recent CT 06/18/2018 FINDINGS: Lower chest: Grossly stable appearance of a 5 mm sub solid nodule in the right lower lobe (4/8). Few additional 2-3 mm subpleural micronodule seen in the right lung base, possibly post  infectious or inflammatory with additional bandlike areas of scarring and architectural distortion in both lower lobes. Small pericardial effusion, similar to comparison exams. Coronary atherosclerosis is present. Cardiac size within normal limits. Hepatobiliary: Stable appearance of a calcified hepatic granuloma. Normal hepatic attenuation. Calcified, dependently layering gallstone in the otherwise normal gallbladder. Mild intra and extrahepatic biliary ductal dilatation with the common bile duct measuring up to 11 mm, slightly greater than expected for senescent change though similar to comparison imaging. No visible intraductal gallstones. Pancreas: Mild to moderate pancreatic atrophy. No concerning pancreatic lesion. No pancreatic ductal dilatation or surrounding inflammatory changes. Spleen: Normal in size. No concerning splenic lesions. Adrenals/Urinary Tract: Normal adrenal glands. Kidneys are normally located with symmetric enhancement and excretion. Multiple fluid attenuation cysts present in both kidneys. Largest is a 7.1 cm exophytic cyst arising from the lower pole left kidney. No concerning renal lesions. Left extrarenal pelvis is similar to priors. No suspicious renal lesion, urolithiasis or hydronephrosis. No conspicuous bladder wall thickening or debris. Left posterolateral bladder diverticulum, similar to prior. Stomach/Bowel: Distal esophagus and stomach are unremarkable. Duodenum is borderline distended and fluid-filled. There is some proximal segmental thickening of the  jejunal loops in the upper abdomen as well with a questionable transition point in the mid abdomen (2/29) more distal small bowel is diffusely fluid-filled as well but of a more normal caliber. Appendix is not visualized. No focal inflammation the vicinity of the cecum to suggest an occult appendicitis. No colonic dilatation or wall thickening. Scattered colonic diverticula without focal inflammation to suggest diverticulitis. Vascular/Lymphatic: Atherosclerotic calcifications within the abdominal aorta and branch vessels. There is multilobulated fusiform infrarenal abdominal aortic dilatation measuring up to 3 cm in maximal diameter. No clear extension of dilatation beyond the aortic bifurcation. Appearance is similar to comparison exam. No acute luminal abnormality is seen. No suspicious or enlarged lymph nodes in the included lymphatic chains. Reproductive: Atrophic appearance of a retroverted uterus. Several small fluid attenuation cysts are seen in the left adnexa, not significantly changed from comparison exam. Additionally, a 4 cm fluid attenuation structure previously seen in the right adnexa now appears displaced posterolaterally towards the left adnexa and could reflect some rotation albeit without adjacent inflammation to suggest a torsion. Other: Abdominopelvic free air or fluid. No bowel containing hernias. Mild body wall edema. Musculoskeletal: Severe levocurvature of the lumbar spine, apex L2. Multilevel discogenic and facet degenerative changes are present throughout the spine. Additional degenerative changes in the hips and pelvis. IMPRESSION: 1. There is dilatation of the proximal small bowel with areas of segmental thickening in the of the jejunal loops in the upper abdomen as well with a questionable transition point in the mid abdomen (2/29). More distal small bowel is diffusely fluid-filled as well as of a more normal caliber. Findings are suggestive of enteritis with a possible early or  partial small bowel obstruction. 2. Cholelithiasis without evidence of acute cholecystitis. Mild intra and extrahepatic biliary ductal dilatation with the common bile duct measuring up to 11 mm, slightly greater than expected for senescent change though similar to comparison imaging. Correlate with liver serologies and consider further evaluation with MRCP if clinically warranted. This recommendation follows ACR consensus guidelines: White Paper of the ACR Incidental Findings Committee II on Gallbladder and Biliary Findings. J Am Coll Radiol 2013:;10:953-956. 3. A 4 cm fluid attenuation structure previously seen in the right adnexa now appears displaced posterolaterally towards the left adnexa and could reflect some rotation albeit without adjacent inflammation to suggest a torsion. If  there is clinical concern could consider pelvic ultrasound for further interrogation. Otherwise at minimum, a follow-up ultrasound of the adnexal lesions in 6-12 months should be obtained note: This recommendation does not apply to premenarchal patients and to those with increased risk (genetic, family history, elevated tumor markers or other high-risk factors) of ovarian cancer. Reference: JACR 2020 Feb; 17(2):248-254 4. Stable fusiform infrarenal abdominal aortic dilatation measuring up to 3 cm in maximal diameter. Recommend follow-up ultrasound every 3 years. This recommendation follows ACR consensus guidelines: White Paper of the ACR Incidental Findings Committee II on Vascular Findings. J Am Coll Radiol 2013CJ:3944253. 5. Aortic Atherosclerosis (ICD10-I70.0). Electronically Signed   By: Lovena Le M.D.   On: 10/19/2020 22:42   DG Chest Portable 1 View  Result Date: 10/19/2020 CLINICAL DATA:  Abdominal pain and vomiting. EXAM: PORTABLE CHEST 1 VIEW COMPARISON:  April 16, 2018 FINDINGS: The heart size and mediastinal contours are within normal limits. There is marked severity calcification and tortuosity of the thoracic  aorta. Both lungs are clear. The visualized skeletal structures are unremarkable. IMPRESSION: No active cardiopulmonary disease. Electronically Signed   By: Virgina Norfolk M.D.   On: 10/19/2020 21:38        Scheduled Meds: . heparin  5,000 Units Subcutaneous Q8H  . insulin aspart  0-5 Units Subcutaneous QHS  . insulin aspart  0-9 Units Subcutaneous TID WC   Continuous Infusions: . lactated ringers 125 mL/hr at 10/20/20 1721     Time spent: 26 minutes with over 50% of the time coordinating the patient's care    Harold Hedge, DO [facility]   Call night coverage person covering after 7pm

## 2020-10-20 NOTE — ED Notes (Signed)
Received report from previous nurse at 0315. NG tube and suction set up by previous nurse as well. Patient is resting comfortably and has no complaints at this time.

## 2020-10-20 NOTE — ED Notes (Signed)
Bladder scan shown 438mL when scanned.

## 2020-10-20 NOTE — Progress Notes (Signed)
Patient CBG was 67 made the NT rechecked it one more time for confirmation and it went down to 58 patient was assessed and is asymptomatic denies dizzines , sweating,no distress hypoglycemia protocol escalated when D50 was given IV RN noticed that IV is leaking, notified for new IV access due to patient being a hard stick, patient gotten new IV and D50 was given intravenously. We will continue to monitor.

## 2020-10-20 NOTE — Consult Note (Addendum)
[name] [birth year]  [id number].    Requesting MD: Dr. Myna Hidalgo Chief Complaint/Reason for Consult: pSBO  HPI: [name] is a 87 y.o. female with a hx of Factor V Leiden with prior DVT (not on blood thinners prior to admission), DM2, HTN, HLD, who presented with abdominal pain n/v.   Patient reports yesterday around 12:00 she started having generalized crampy abdominal pain.  She reports that between the hours of 12-7 her abdominal pain became more severe and constant.  She notes associated nausea and emesis.  She reports she was unable to pass flatus or have a BM prior to presenting to the ED for evaluation.  She reports her symptoms felt similar to when she has had SBO's in the past.  In the ED she underwent work-up including a CT scan that was concerning for pSBO.  She was admitted to the hospitalist service and NG tube was placed.  She reports since NGT placement her pain and nausea have resolved.  She still is distended but reports she has not urinated yet today.  She started passing some flatus this morning and had 1 small loose bowel movement. Patient has a hx of appendectomy, abdominal hysterectomy, and exploratory laparotomy for removal of FB (2014).   ROS: Review of Systems  Constitutional: Negative for chills and fever.  Respiratory: Negative for cough and shortness of breath.   Cardiovascular: Negative for chest pain.  Gastrointestinal: Positive for abdominal pain, constipation, nausea and vomiting. Negative for diarrhea.  Genitourinary:       Retention  Psychiatric/Behavioral: Negative for substance abuse.  All other systems reviewed and are negative.   Family History  Family history unknown: Yes    Past Medical History:  Diagnosis Date  . Chronic kidney disease   . DVT (deep venous thrombosis) ([place])   . Dysphonia   . Factor V Leiden ([place])   . Hypertension   . Osteoporosis   . PNA (pneumonia) 12/31/2012  . Pneumonitis 03/19/2013  . SBO (small bowel  obstruction) ([place]) 06/2018  . Small bowel obstruction [facility])     Past Surgical History:  Procedure Laterality Date  . ABDOMINAL HYSTERECTOMY    . APPENDECTOMY    . LAPAROTOMY N/A 01/01/2013   Procedure: EXPLORATORY LAPAROTOMY removal of smal bowel foreign body;  Surgeon: Madilyn Hook, DO;  Location: [facility] ORS;  Service: General;  Laterality: N/A;  . TONSILLECTOMY      Social History:  reports that she quit smoking about 21 years ago. She has never used smokeless tobacco. She reports current alcohol use. She reports that she does not use drugs. Patient lives at home alone. She does not use any assistive devices to ambulate. She denies tobacco use currently. No alcohol or IDU. Retired.   Allergies:  Allergies  Allergen Reactions  . Diovan [Valsartan] Other (See Comments)    gas  . Hctz [Hydrochlorothiazide]     gas  . Tekamlo [Aliskiren-Amlodipine]     gas  . Valturna [Aliskiren-Valsartan]     Gas     (Not in a hospital admission)   Physical Exam: Blood pressure (!) 143/61, pulse 88, temperature 97.8 F (36.6 C), temperature source Oral, resp. rate 17, SpO2 92 %. General: pleasant, WD/WN elderly female who is laying in bed in NAD HEENT: head is normocephalic, atraumatic.  Sclera are noninjected.  PERRL.  Ears and nose without any masses or lesions.  Mouth is pink and moist. Dentition fair Heart: regular, rate, and rhythm. No obvious murmurs  noted.  Palpable radial pulses bilaterally. No LE edema  Lungs: CTAB, no wheezes, rhonchi, or rales noted.  Respiratory effort nonlabored Abd: Soft, moderate distension of the lower abdomen, NT, hypoactive BS, no masses, hernias, or organomegaly. Prior abdominal scars are well healed. NGT in place with small amount of bilious output in cannister.  MS: no BUE/BLE edema, calves soft and nontender Skin: warm and dry with no masses, lesions, or rashes Psych: A&Ox4 with an appropriate affect Neuro: cranial nerves grossly intact, equal strength in  BUE/BLE bilaterally, normal speech, thought process intact. Gait not assessed.  Results for orders placed or performed during the hospital encounter of 10/19/20 (from the past 48 hour(s))  Urinalysis, Routine w reflex microscopic     Status: Abnormal   Collection Time: 10/19/20 11:40 AM  Result Value Ref Range   Color, Urine YELLOW YELLOW   APPearance HAZY (A) CLEAR   Specific Gravity, Urine 1.041 (H) 1.005 - 1.030   pH 5.0 5.0 - 8.0   Glucose, UA NEGATIVE NEGATIVE mg/dL   Hgb urine dipstick NEGATIVE NEGATIVE   Bilirubin Urine NEGATIVE NEGATIVE   Ketones, ur 5 (A) NEGATIVE mg/dL   Protein, ur 30 (A) NEGATIVE mg/dL   Nitrite POSITIVE (A) NEGATIVE   Leukocytes,Ua MODERATE (A) NEGATIVE   RBC / HPF 0-5 0 - 5 RBC/hpf   WBC, UA >50 (H) 0 - 5 WBC/hpf   Bacteria, UA MANY (A) NONE SEEN   Squamous Epithelial / LPF 0-5 0 - 5   Mucus PRESENT    Non Squamous Epithelial 0-5 (A) NONE SEEN    Comment: Performed at [facility], [place] [address] [place], [place] [address]  Lipase, blood     Status: Abnormal   Collection Time: 10/19/20  2:15 PM  Result Value Ref Range   Lipase 90 (H) 11 - 51 U/L    Comment: Performed at [facility], [place] [address], [place], [place] [address]  Comprehensive metabolic panel     Status: Abnormal   Collection Time: 10/19/20  2:15 PM  Result Value Ref Range   Sodium 140 135 - 145 mmol/L   Potassium 4.3 3.5 - 5.1 mmol/L   Chloride 95 (L) 98 - 111 mmol/L   CO2 28 22 - 32 mmol/L   Glucose, Bld 122 (H) 70 - 99 mg/dL    Comment: Glucose reference range applies only to samples taken after fasting for at least 8 hours.   BUN 27 (H) 8 - 23 mg/dL   Creatinine, Ser 1.46 (H) 0.44 - 1.00 mg/dL   Calcium 11.3 (H) 8.9 - 10.3 mg/dL   Total Protein 7.7 6.5 - 8.1 g/dL   Albumin 4.2 3.5 - 5.0 g/dL   AST 25 15 - 41 U/L   ALT 24 0 - 44 U/L   Alkaline Phosphatase 85 38 - 126 U/L   Total Bilirubin 1.2 0.3 - 1.2 mg/dL   GFR, Estimated 34 (L)  >60 mL/min    Comment: (NOTE) Calculated using the CKD-EPI Creatinine Equation (2021)    Anion gap 17 (H) 5 - 15    Comment: Performed at [facility], [place] [address] [place], [place] [address]  CBC     Status: Abnormal   Collection Time: 10/19/20  2:15 PM  Result Value Ref Range   WBC 11.2 (H) 4.0 - 10.5 K/uL   RBC 5.31 (H) 3.87 - 5.11 MIL/uL   Hemoglobin 16.2 (H) 12.0 - 15.0 g/dL   HCT 50.1 (H)  36.0 - 46.0 %   MCV 94.4 80.0 - 100.0 fL   MCH 30.5 26.0 - 34.0 pg   MCHC 32.3 30.0 - 36.0 g/dL   RDW 14.1 11.5 - 15.5 %   Platelets 160 150 - 400 K/uL   nRBC 0.0 0.0 - 0.2 %    Comment: Performed at [facility], [place] [address], [place], [place] [address]  Hemoglobin A1c     Status: None   Collection Time: 10/19/20  2:15 PM  Result Value Ref Range   Hgb A1c MFr Bld 5.5 4.8 - 5.6 %    Comment: (NOTE) Pre diabetes:          5.7%-6.4%  Diabetes:              >6.4%  Glycemic control for   <7.0% adults with diabetes    Mean Plasma Glucose 111.15 mg/dL    Comment: Performed at [place] [address] [place], [place] [address]  Resp Panel by RT-PCR (Flu A&B, Covid) Nasopharyngeal Swab     Status: None   Collection Time: 10/19/20 10:49 PM   Specimen: Nasopharyngeal Swab; Nasopharyngeal(NP) swabs in vial transport medium  Result Value Ref Range   SARS Coronavirus 2 by RT PCR NEGATIVE NEGATIVE    Comment: (NOTE) SARS-CoV-2 target nucleic acids are NOT DETECTED.  The SARS-CoV-2 RNA is generally detectable in upper respiratory specimens during the acute phase of infection. The lowest concentration of SARS-CoV-2 viral copies this assay can detect is 138 copies/mL. A negative result does not preclude SARS-Cov-2 infection and should not be used as the sole basis for treatment or other patient management decisions. A negative result may occur with  improper specimen collection/handling, submission of specimen other than nasopharyngeal  swab, presence of viral mutation(s) within the areas targeted by this assay, and inadequate number of viral copies(<138 copies/mL). A negative result must be combined with clinical observations, patient history, and epidemiological information. The expected result is Negative.  Fact Sheet for Patients:  EntrepreneurPulse.com.au  Fact Sheet for Healthcare Providers:  IncredibleEmployment.be  This test is no t yet approved or cleared by the [place] FDA and  has been authorized for detection and/or diagnosis of SARS-CoV-2 by FDA under an Emergency Use Authorization (EUA). This EUA will remain  in effect (meaning this test can be used) for the duration of the COVID-19 declaration under Section 564(b)(1) of the Act, 21 U.S.C.section 360bbb-3(b)(1), unless the authorization is terminated  or revoked sooner.       Influenza A by PCR NEGATIVE NEGATIVE   Influenza B by PCR NEGATIVE NEGATIVE    Comment: (NOTE) The Xpert Xpress SARS-CoV-2/FLU/RSV plus assay is intended as an aid in the diagnosis of influenza from Nasopharyngeal swab specimens and should not be used as a sole basis for treatment. Nasal washings and aspirates are unacceptable for Xpert Xpress SARS-CoV-2/FLU/RSV testing.  Fact Sheet for Patients: EntrepreneurPulse.com.au  Fact Sheet for Healthcare Providers: IncredibleEmployment.be  This test is not yet approved or cleared by the [place] FDA and has been authorized for detection and/or diagnosis of SARS-CoV-2 by FDA under an Emergency Use Authorization (EUA). This EUA will remain in effect (meaning this test can be used) for the duration of the COVID-19 declaration under Section 564(b)(1) of the Act, 21 U.S.C. section 360bbb-3(b)(1), unless the authorization is terminated or revoked.  Performed at [facility], [place] [address] [place], [place] [address]   CBG  monitoring, ED  Status: Abnormal   Collection Time: 10/19/20 11:49 PM  Result Value Ref Range   Glucose-Capillary 124 (H) 70 - 99 mg/dL    Comment: Glucose reference range applies only to samples taken after fasting for at least 8 hours.   Comment 1 Notify RN   CBC     Status: Abnormal   Collection Time: 10/20/20  4:40 AM  Result Value Ref Range   WBC 8.5 4.0 - 10.5 K/uL   RBC 4.77 3.87 - 5.11 MIL/uL   Hemoglobin 14.4 12.0 - 15.0 g/dL   HCT 46.7 (H) 36.0 - 46.0 %   MCV 97.9 80.0 - 100.0 fL   MCH 30.2 26.0 - 34.0 pg   MCHC 30.8 30.0 - 36.0 g/dL   RDW 14.3 11.5 - 15.5 %   Platelets 138 (L) 150 - 400 K/uL   nRBC 0.0 0.0 - 0.2 %    Comment: Performed at [facility], [place] [address] [place], [place] [address]  Comprehensive metabolic panel     Status: Abnormal   Collection Time: 10/20/20  4:40 AM  Result Value Ref Range   Sodium 138 135 - 145 mmol/L   Potassium 4.7 3.5 - 5.1 mmol/L   Chloride 100 98 - 111 mmol/L   CO2 27 22 - 32 mmol/L   Glucose, Bld 134 (H) 70 - 99 mg/dL    Comment: Glucose reference range applies only to samples taken after fasting for at least 8 hours.   BUN 40 (H) 8 - 23 mg/dL   Creatinine, Ser 1.41 (H) 0.44 - 1.00 mg/dL   Calcium 9.6 8.9 - 10.3 mg/dL   Total Protein 6.2 (L) 6.5 - 8.1 g/dL   Albumin 3.4 (L) 3.5 - 5.0 g/dL   AST 20 15 - 41 U/L   ALT 18 0 - 44 U/L   Alkaline Phosphatase 65 38 - 126 U/L   Total Bilirubin 1.2 0.3 - 1.2 mg/dL   GFR, Estimated 36 (L) >60 mL/min    Comment: (NOTE) Calculated using the CKD-EPI Creatinine Equation (2021)    Anion gap 11 5 - 15    Comment: Performed at [facility], [place] [address], [place], [place] [address]  CBG monitoring, ED     Status: Abnormal   Collection Time: 10/20/20  7:42 AM  Result Value Ref Range   Glucose-Capillary 103 (H) 70 - 99 mg/dL    Comment: Glucose reference range applies only to samples taken after fasting for at least 8 hours.   DG Abdomen 1  View  Result Date: 10/20/2020 CLINICAL DATA:  NG tube EXAM: ABDOMEN - 1 VIEW COMPARISON:  June 21, 2018 FINDINGS: The enteric tube projects over the gastric body. The tip is pointed distally. There is contrast within the left renal collecting system with apparent mild left-sided hydronephrosis which is new since prior study. The bladder is distended. The bowel gas pattern is nonspecific. IMPRESSION: 1. Enteric tube projects over the gastric body. 2. New mild left-sided hydronephrosis.  Distended urinary bladder. 3. Nonspecific bowel gas pattern. Electronically Signed   By: Constance Holster M.D.   On: 10/20/2020 02:05   CT ABDOMEN PELVIS W CONTRAST  Result Date: 10/19/2020 CLINICAL DATA:  Bowel obstruction suspected, increasing abdominal distension EXAM: CT ABDOMEN AND PELVIS WITH CONTRAST TECHNIQUE: Multidetector CT imaging of the abdomen and pelvis was performed using the standard protocol following bolus administration of intravenous contrast. CONTRAST:  76mL OMNIPAQUE IOHEXOL 300 MG/ML  SOLN COMPARISON:  Multiple priors, most recent  CT 06/18/2018 FINDINGS: Lower chest: Grossly stable appearance of a 5 mm sub solid nodule in the right lower lobe (4/8). Few additional 2-3 mm subpleural micronodule seen in the right lung base, possibly post infectious or inflammatory with additional bandlike areas of scarring and architectural distortion in both lower lobes. Small pericardial effusion, similar to comparison exams. Coronary atherosclerosis is present. Cardiac size within normal limits. Hepatobiliary: Stable appearance of a calcified hepatic granuloma. Normal hepatic attenuation. Calcified, dependently layering gallstone in the otherwise normal gallbladder. Mild intra and extrahepatic biliary ductal dilatation with the common bile duct measuring up to 11 mm, slightly greater than expected for senescent change though similar to comparison imaging. No visible intraductal gallstones. Pancreas: Mild to  moderate pancreatic atrophy. No concerning pancreatic lesion. No pancreatic ductal dilatation or surrounding inflammatory changes. Spleen: Normal in size. No concerning splenic lesions. Adrenals/Urinary Tract: Normal adrenal glands. Kidneys are normally located with symmetric enhancement and excretion. Multiple fluid attenuation cysts present in both kidneys. Largest is a 7.1 cm exophytic cyst arising from the lower pole left kidney. No concerning renal lesions. Left extrarenal pelvis is similar to priors. No suspicious renal lesion, urolithiasis or hydronephrosis. No conspicuous bladder wall thickening or debris. Left posterolateral bladder diverticulum, similar to prior. Stomach/Bowel: Distal esophagus and stomach are unremarkable. Duodenum is borderline distended and fluid-filled. There is some proximal segmental thickening of the jejunal loops in the upper abdomen as well with a questionable transition point in the mid abdomen (2/29) more distal small bowel is diffusely fluid-filled as well but of a more normal caliber. Appendix is not visualized. No focal inflammation the vicinity of the cecum to suggest an occult appendicitis. No colonic dilatation or wall thickening. Scattered colonic diverticula without focal inflammation to suggest diverticulitis. Vascular/Lymphatic: Atherosclerotic calcifications within the abdominal aorta and branch vessels. There is multilobulated fusiform infrarenal abdominal aortic dilatation measuring up to 3 cm in maximal diameter. No clear extension of dilatation beyond the aortic bifurcation. Appearance is similar to comparison exam. No acute luminal abnormality is seen. No suspicious or enlarged lymph nodes in the included lymphatic chains. Reproductive: Atrophic appearance of a retroverted uterus. Several small fluid attenuation cysts are seen in the left adnexa, not significantly changed from comparison exam. Additionally, a 4 cm fluid attenuation structure previously seen in  the right adnexa now appears displaced posterolaterally towards the left adnexa and could reflect some rotation albeit without adjacent inflammation to suggest a torsion. Other: Abdominopelvic free air or fluid. No bowel containing hernias. Mild body wall edema. Musculoskeletal: Severe levocurvature of the lumbar spine, apex L2. Multilevel discogenic and facet degenerative changes are present throughout the spine. Additional degenerative changes in the hips and pelvis. IMPRESSION: 1. There is dilatation of the proximal small bowel with areas of segmental thickening in the of the jejunal loops in the upper abdomen as well with a questionable transition point in the mid abdomen (2/29). More distal small bowel is diffusely fluid-filled as well as of a more normal caliber. Findings are suggestive of enteritis with a possible early or partial small bowel obstruction. 2. Cholelithiasis without evidence of acute cholecystitis. Mild intra and extrahepatic biliary ductal dilatation with the common bile duct measuring up to 11 mm, slightly greater than expected for senescent change though similar to comparison imaging. Correlate with liver serologies and consider further evaluation with MRCP if clinically warranted. This recommendation follows ACR consensus guidelines: White Paper of the ACR Incidental Findings Committee II on Gallbladder and Biliary Findings. J Am Coll Radiol 2013:;10:953-956.  3. A 4 cm fluid attenuation structure previously seen in the right adnexa now appears displaced posterolaterally towards the left adnexa and could reflect some rotation albeit without adjacent inflammation to suggest a torsion. If there is clinical concern could consider pelvic ultrasound for further interrogation. Otherwise at minimum, a follow-up ultrasound of the adnexal lesions in 6-12 months should be obtained note: This recommendation does not apply to premenarchal patients and to those with increased risk (genetic, family  history, elevated tumor markers or other high-risk factors) of ovarian cancer. Reference: JACR 2020 Feb; 17(2):248-254 4. Stable fusiform infrarenal abdominal aortic dilatation measuring up to 3 cm in maximal diameter. Recommend follow-up ultrasound every 3 years. This recommendation follows ACR consensus guidelines: White Paper of the ACR Incidental Findings Committee II on Vascular Findings. J Am Coll Radiol 2013CJ:3944253. 5. Aortic Atherosclerosis (ICD10-I70.0). Electronically Signed   By: Lovena Le M.D.   On: 10/19/2020 22:42   DG Chest Portable 1 View  Result Date: 10/19/2020 CLINICAL DATA:  Abdominal pain and vomiting. EXAM: PORTABLE CHEST 1 VIEW COMPARISON:  April 16, 2018 FINDINGS: The heart size and mediastinal contours are within normal limits. There is marked severity calcification and tortuosity of the thoracic aorta. Both lungs are clear. The visualized skeletal structures are unremarkable. IMPRESSION: No active cardiopulmonary disease. Electronically Signed   By: Virgina Norfolk M.D.   On: 10/19/2020 21:38   Anti-infectives (From admission, onward)   None       Assessment/Plan History of factor V Leiden with prior DVT - Not on blood thinners prior to admission DM2 - SSI per TRH Hx HTN - Per TRH Hx HLD 4 cm fluid attenuation structure displaced posterior laterally towards left adnexa - Per TRH  Infrarenal abdominal aortic dilatation - Noted on CT. Radiology recommends follow-up ultrasound every 3 years AKI - Cr 1.41 this AM. Baseline 0.94 on 06/23/18 Urinary retention - patient cannot recall the last time that she voided.  She has new left-sided hydronephrosis and distended urinary bladder on x-ray this morning.  I have asked the patient's nurse to BladderScan Cholelithiasis - No evidence of cholecystitis on imaging. No RUQ tenderness Dilated CBD - 60mm on CT. Trend LFT's to determine if needs MRCP. Currently wnl and without RUQ abdominal pain. She does have chronically  elevated lipase (90 yesterday)  pSBO - Patient with hx of appendectomy, abdominal hysterectomy, and exploratory laparotomy for removal of FB (2014) - No indication for emergency surgery - Agree with NPO and NGT - Start SBO protocol - Keep K > 4 and Mg > 2 for bowel function - Mobilize as able for bowel function - We will follow with you  FEN - NPO, NGT, IVF VTE - SCDs, Lovenox ID - None Foley - Bladder scan  Jillyn Ledger, [facility] Surgery 10/20/2020, 8:16 AM Please see Amion for pager number during day hours 7:00am-4:30pm

## 2020-10-21 ENCOUNTER — Inpatient Hospital Stay (HOSPITAL_COMMUNITY): Payer: PPO

## 2020-10-21 DIAGNOSIS — I10 Essential (primary) hypertension: Secondary | ICD-10-CM

## 2020-10-21 DIAGNOSIS — E785 Hyperlipidemia, unspecified: Secondary | ICD-10-CM

## 2020-10-21 LAB — GLUCOSE, CAPILLARY
Glucose-Capillary: 73 mg/dL (ref 70–99)
Glucose-Capillary: 95 mg/dL (ref 70–99)
Glucose-Capillary: 80 mg/dL (ref 70–99)
Glucose-Capillary: 163 mg/dL — ABNORMAL HIGH (ref 70–99)
Glucose-Capillary: 78 mg/dL (ref 70–99)

## 2020-10-21 LAB — COMPREHENSIVE METABOLIC PANEL
ALT: 16 U/L (ref 0–44)
AST: 18 U/L (ref 15–41)
Albumin: 3 g/dL — ABNORMAL LOW (ref 3.5–5.0)
Alkaline Phosphatase: 54 U/L (ref 38–126)
Anion gap: 12 (ref 5–15)
BUN: 27 mg/dL — ABNORMAL HIGH (ref 8–23)
CO2: 27 mmol/L (ref 22–32)
Calcium: 8.8 mg/dL — ABNORMAL LOW (ref 8.9–10.3)
Chloride: 104 mmol/L (ref 98–111)
Creatinine, Ser: 0.9 mg/dL (ref 0.44–1.00)
GFR, Estimated: >60 mL/min (ref >60)
Glucose, Bld: 86 mg/dL (ref 70–99)
Potassium: 3.6 mmol/L (ref 3.5–5.1)
Sodium: 143 mmol/L (ref 135–145)
Total Bilirubin: 1.2 mg/dL (ref 0.3–1.2)
Total Protein: 5.5 g/dL — ABNORMAL LOW (ref 6.5–8.1)

## 2020-10-21 LAB — CBC
HCT: 42 % (ref 36.0–46.0)
Hemoglobin: 13 g/dL (ref 12.0–15.0)
MCH: 30.7 pg (ref 26.0–34.0)
MCHC: 31 g/dL (ref 30.0–36.0)
MCV: 99.1 fL (ref 80.0–100.0)
Platelets: 112 10*3/uL — ABNORMAL LOW (ref 150–400)
RBC: 4.24 MIL/uL (ref 3.87–5.11)
RDW: 14.3 % (ref 11.5–15.5)
WBC: 5.2 10*3/uL (ref 4.0–10.5)
nRBC: 0 % (ref 0.0–0.2)

## 2020-10-21 LAB — MAGNESIUM: Magnesium: 1.9 mg/dL (ref 1.7–2.4)

## 2020-10-21 MED ORDER — METOPROLOL SUCCINATE ER 25 MG PO TB24
25.0000 mg | ORAL_TABLET | Freq: Every day | ORAL | Status: DC
  Administered 2020-10-21 – 2020-10-23 (×3): 25 mg via ORAL
  Filled 2020-10-21 (×3): qty 1

## 2020-10-21 MED ORDER — AMLODIPINE BESYLATE 10 MG PO TABS
10.0000 mg | ORAL_TABLET | Freq: Every day | ORAL | Status: DC
  Administered 2020-10-21 – 2020-10-23 (×3): 10 mg via ORAL
  Filled 2020-10-21 (×3): qty 1

## 2020-10-21 NOTE — Progress Notes (Signed)
Patient CBG 15 minutes after D50 is 185. We will continue to monitor.

## 2020-10-21 NOTE — Progress Notes (Signed)
PROGRESS NOTE    [name]  [id number] DOB: Jan 07, 1929 DOA: 10/19/2020 PCP: Carol Ada, MD   Brief Narrative: [name] is a 87 y.o. female with a history of recurrent SBO, factor v leiden with history of DVT, diabetes mellitus, hypertension, hyperlipidemia, CKD stage II. Patient presented secondary to abdominal pain, nausea and vomiting with evidence of partial small bowel obstruction. NG tube placed for decompression. SBO protocol initiated and general surgery consulted. Non-operative management.   Assessment & Plan:   Principal Problem:   Partial small bowel obstruction (HCC) Active Problems:   HTN (hypertension)   Dehydration   Anemia in chronic kidney disease   Hyperlipidemia   Diabetes mellitus (HCC)   Factor V Leiden (HCC)   Hypercalcemia   Partial small bowel obstruction General surgery consulted. Conservative management with NG tube placed on 1/17. NG tube removed on 1/19 with improvement of bowel function. -General surgery recommendations: Clear liquid diet -Ambulate with nursing  Primary hypertension Slightly uncontrolled. Patient is on amlodipine, metoprolol XL and olmesartan as an outpatient which were held secondary to above -Restart home amlodipine and metoprolol  Diabetes mellitus, type 2 Hemoglobin A1C of 5.5%. Not on outpatient regimen. -Continue SSI   AKI Baseline creatinine of 0.9. Creatinine of 1.46 on admission. AKI resolved with IV fluids.  Acute respiratory failure with hypoxia Unsure of etiology. Chest x-ray not very remarkable for etiology. -Wean to room air  Hypercalcemia Mild. Improved with IV fluids.  Hyperlipidemia Patient is on Crestor as an outpatient which was held on admission secondary to partial SBO.  Anemia of chronic disease Stable.  Thrombocytopenia Mild. Likely reactive.   DVT prophylaxis: Heparin subq Code Status:   Code Status: Full Code Family Communication: None at bedside Disposition Plan:  Discharge home likely in 1-2 days pending successful advancement of diet   Consultants:   General surgery  Procedures:   None  Antimicrobials:  None    Subjective: No abdominal pain. Having bowel movements.  Objective: Vitals:   10/20/20 2115 10/21/20 0535 10/21/20 1000 10/21/20 1358  BP: (!) 164/71 (!) 165/69  (!) 175/63  Pulse: 86 89  68  Resp: 18 18  16   Temp: 97.8 F (36.6 C) 98.1 F (36.7 C)  98.2 F (36.8 C)  TempSrc: Oral Oral  Oral  SpO2: 93% (!) 86% (!) 88% 91%    Intake/Output Summary (Last 24 hours) at 10/21/2020 1722 Last data filed at 10/21/2020 1400 Gross per 24 hour  Intake 1388.7 ml  Output 710 ml  Net 678.7 ml   There were no vitals filed for this visit.  Examination:  General exam: Appears calm and comfortable Respiratory system: Clear to auscultation. Respiratory effort normal. Cardiovascular system: S1 & S2 heard, RRR. No murmurs, rubs, gallops or clicks. Gastrointestinal system: Abdomen is moderately distended, soft and nontender. No organomegaly or masses felt. Normal bowel sounds heard. Central nervous system: Alert and oriented. No focal neurological deficits. Musculoskeletal: No edema. No calf tenderness Skin: No cyanosis. No rashes Psychiatry: Judgement and insight appear normal. Mood & affect appropriate.     Data Reviewed: I have personally reviewed following labs and imaging studies  CBC Lab Results  Component Value Date   WBC 5.2 10/21/2020   RBC 4.24 10/21/2020   HGB 13.0 10/21/2020   HCT 42.0 10/21/2020   MCV 99.1 10/21/2020   MCH 30.7 10/21/2020   PLT 112 (L) 10/21/2020   MCHC 31.0 10/21/2020   RDW 14.3 10/21/2020   LYMPHSABS 1.8 12/31/2012  MONOABS 0.6 12/31/2012   EOSABS 0.0 12/31/2012   BASOSABS 0.0 17/61/6073     Last metabolic panel Lab Results  Component Value Date   NA 143 10/21/2020   K 3.6 10/21/2020   CL 104 10/21/2020   CO2 27 10/21/2020   BUN 27 (H) 10/21/2020   CREATININE 0.90 10/21/2020    GLUCOSE 86 10/21/2020   GFRNONAA >60 10/21/2020   GFRAA >60 06/23/2018   CALCIUM 8.8 (L) 10/21/2020   PHOS 6.6 (H) 01/28/2013   PROT 5.5 (L) 10/21/2020   ALBUMIN 3.0 (L) 10/21/2020   BILITOT 1.2 10/21/2020   ALKPHOS 54 10/21/2020   AST 18 10/21/2020   ALT 16 10/21/2020   ANIONGAP 12 10/21/2020    CBG (last 3)  Recent Labs    10/21/20 0842 10/21/20 1137 10/21/20 1657  GLUCAP 95 80 163*     GFR: CrCl cannot be calculated (Unknown ideal weight.).  Coagulation Profile: No results for input(s): INR, PROTIME in the last 168 hours.  Recent Results (from the past 240 hour(s))  Resp Panel by RT-PCR (Flu A&B, Covid) Nasopharyngeal Swab     Status: None   Collection Time: 10/19/20 10:49 PM   Specimen: Nasopharyngeal Swab; Nasopharyngeal(NP) swabs in vial transport medium  Result Value Ref Range Status   SARS Coronavirus 2 by RT PCR NEGATIVE NEGATIVE Final    Comment: (NOTE) SARS-CoV-2 target nucleic acids are NOT DETECTED.  The SARS-CoV-2 RNA is generally detectable in upper respiratory specimens during the acute phase of infection. The lowest concentration of SARS-CoV-2 viral copies this assay can detect is 138 copies/mL. A negative result does not preclude SARS-Cov-2 infection and should not be used as the sole basis for treatment or other patient management decisions. A negative result may occur with  improper specimen collection/handling, submission of specimen other than nasopharyngeal swab, presence of viral mutation(s) within the areas targeted by this assay, and inadequate number of viral copies(<138 copies/mL). A negative result must be combined with clinical observations, patient history, and epidemiological information. The expected result is Negative.  Fact Sheet for Patients:  EntrepreneurPulse.com.au  Fact Sheet for Healthcare Providers:  IncredibleEmployment.be  This test is no t yet approved or cleared by the [place] FDA and  has been authorized for detection and/or diagnosis of SARS-CoV-2 by FDA under an Emergency Use Authorization (EUA). This EUA will remain  in effect (meaning this test can be used) for the duration of the COVID-19 declaration under Section 564(b)(1) of the Act, 21 U.S.C.section 360bbb-3(b)(1), unless the authorization is terminated  or revoked sooner.       Influenza A by PCR NEGATIVE NEGATIVE Final   Influenza B by PCR NEGATIVE NEGATIVE Final    Comment: (NOTE) The Xpert Xpress SARS-CoV-2/FLU/RSV plus assay is intended as an aid in the diagnosis of influenza from Nasopharyngeal swab specimens and should not be used as a sole basis for treatment. Nasal washings and aspirates are unacceptable for Xpert Xpress SARS-CoV-2/FLU/RSV testing.  Fact Sheet for Patients: EntrepreneurPulse.com.au  Fact Sheet for Healthcare Providers: IncredibleEmployment.be  This test is not yet approved or cleared by the [place] FDA and has been authorized for detection and/or diagnosis of SARS-CoV-2 by FDA under an Emergency Use Authorization (EUA). This EUA will remain in effect (meaning this test can be used) for the duration of the COVID-19 declaration under Section 564(b)(1) of the Act, 21 U.S.C. section 360bbb-3(b)(1), unless the authorization is terminated or revoked.  Performed at [facility], [address]  [place], [place] [address]         Radiology Studies: DG Abdomen 1 View  Result Date: 10/20/2020 CLINICAL DATA:  NG tube EXAM: ABDOMEN - 1 VIEW COMPARISON:  June 21, 2018 FINDINGS: The enteric tube projects over the gastric body. The tip is pointed distally. There is contrast within the left renal collecting system with apparent mild left-sided hydronephrosis which is new since prior study. The bladder is distended. The bowel gas pattern is nonspecific. IMPRESSION: 1. Enteric tube projects over the gastric  body. 2. New mild left-sided hydronephrosis.  Distended urinary bladder. 3. Nonspecific bowel gas pattern. Electronically Signed   By: Constance Holster M.D.   On: 10/20/2020 02:05   CT ABDOMEN PELVIS W CONTRAST  Result Date: 10/19/2020 CLINICAL DATA:  Bowel obstruction suspected, increasing abdominal distension EXAM: CT ABDOMEN AND PELVIS WITH CONTRAST TECHNIQUE: Multidetector CT imaging of the abdomen and pelvis was performed using the standard protocol following bolus administration of intravenous contrast. CONTRAST:  60mL OMNIPAQUE IOHEXOL 300 MG/ML  SOLN COMPARISON:  Multiple priors, most recent CT 06/18/2018 FINDINGS: Lower chest: Grossly stable appearance of a 5 mm sub solid nodule in the right lower lobe (4/8). Few additional 2-3 mm subpleural micronodule seen in the right lung base, possibly post infectious or inflammatory with additional bandlike areas of scarring and architectural distortion in both lower lobes. Small pericardial effusion, similar to comparison exams. Coronary atherosclerosis is present. Cardiac size within normal limits. Hepatobiliary: Stable appearance of a calcified hepatic granuloma. Normal hepatic attenuation. Calcified, dependently layering gallstone in the otherwise normal gallbladder. Mild intra and extrahepatic biliary ductal dilatation with the common bile duct measuring up to 11 mm, slightly greater than expected for senescent change though similar to comparison imaging. No visible intraductal gallstones. Pancreas: Mild to moderate pancreatic atrophy. No concerning pancreatic lesion. No pancreatic ductal dilatation or surrounding inflammatory changes. Spleen: Normal in size. No concerning splenic lesions. Adrenals/Urinary Tract: Normal adrenal glands. Kidneys are normally located with symmetric enhancement and excretion. Multiple fluid attenuation cysts present in both kidneys. Largest is a 7.1 cm exophytic cyst arising from the lower pole left kidney. No concerning renal  lesions. Left extrarenal pelvis is similar to priors. No suspicious renal lesion, urolithiasis or hydronephrosis. No conspicuous bladder wall thickening or debris. Left posterolateral bladder diverticulum, similar to prior. Stomach/Bowel: Distal esophagus and stomach are unremarkable. Duodenum is borderline distended and fluid-filled. There is some proximal segmental thickening of the jejunal loops in the upper abdomen as well with a questionable transition point in the mid abdomen (2/29) more distal small bowel is diffusely fluid-filled as well but of a more normal caliber. Appendix is not visualized. No focal inflammation the vicinity of the cecum to suggest an occult appendicitis. No colonic dilatation or wall thickening. Scattered colonic diverticula without focal inflammation to suggest diverticulitis. Vascular/Lymphatic: Atherosclerotic calcifications within the abdominal aorta and branch vessels. There is multilobulated fusiform infrarenal abdominal aortic dilatation measuring up to 3 cm in maximal diameter. No clear extension of dilatation beyond the aortic bifurcation. Appearance is similar to comparison exam. No acute luminal abnormality is seen. No suspicious or enlarged lymph nodes in the included lymphatic chains. Reproductive: Atrophic appearance of a retroverted uterus. Several small fluid attenuation cysts are seen in the left adnexa, not significantly changed from comparison exam. Additionally, a 4 cm fluid attenuation structure previously seen in the right adnexa now appears displaced posterolaterally towards the left adnexa and could reflect some rotation albeit without adjacent inflammation to suggest a torsion. Other: Abdominopelvic  free air or fluid. No bowel containing hernias. Mild body wall edema. Musculoskeletal: Severe levocurvature of the lumbar spine, apex L2. Multilevel discogenic and facet degenerative changes are present throughout the spine. Additional degenerative changes in the  hips and pelvis. IMPRESSION: 1. There is dilatation of the proximal small bowel with areas of segmental thickening in the of the jejunal loops in the upper abdomen as well with a questionable transition point in the mid abdomen (2/29). More distal small bowel is diffusely fluid-filled as well as of a more normal caliber. Findings are suggestive of enteritis with a possible early or partial small bowel obstruction. 2. Cholelithiasis without evidence of acute cholecystitis. Mild intra and extrahepatic biliary ductal dilatation with the common bile duct measuring up to 11 mm, slightly greater than expected for senescent change though similar to comparison imaging. Correlate with liver serologies and consider further evaluation with MRCP if clinically warranted. This recommendation follows ACR consensus guidelines: White Paper of the ACR Incidental Findings Committee II on Gallbladder and Biliary Findings. J Am Coll Radiol 2013:;10:953-956. 3. A 4 cm fluid attenuation structure previously seen in the right adnexa now appears displaced posterolaterally towards the left adnexa and could reflect some rotation albeit without adjacent inflammation to suggest a torsion. If there is clinical concern could consider pelvic ultrasound for further interrogation. Otherwise at minimum, a follow-up ultrasound of the adnexal lesions in 6-12 months should be obtained note: This recommendation does not apply to premenarchal patients and to those with increased risk (genetic, family history, elevated tumor markers or other high-risk factors) of ovarian cancer. Reference: JACR 2020 Feb; 17(2):248-254 4. Stable fusiform infrarenal abdominal aortic dilatation measuring up to 3 cm in maximal diameter. Recommend follow-up ultrasound every 3 years. This recommendation follows ACR consensus guidelines: White Paper of the ACR Incidental Findings Committee II on Vascular Findings. J Am Coll Radiol 2013CJ:3944253. 5. Aortic Atherosclerosis  (ICD10-I70.0). Electronically Signed   By: Lovena Le M.D.   On: 10/19/2020 22:42   DG CHEST PORT 1 VIEW  Result Date: 10/20/2020 CLINICAL DATA:  Hypoxia, shortness of breath. EXAM: PORTABLE CHEST 1 VIEW COMPARISON:  Chest x-ray 10/19/2020, chest x-ray chest x-ray 10/19/2020, CT abdomen pelvis 06/18/2018 FINDINGS: Enteric tube courses below the hemidiaphragm with tip and side port collimated off view. The heart size and mediastinal contours are unchanged. Aortic arch calcification. Flattening of the hemidiaphragms suggestive of emphysematous changes. Redemonstration of nodular-like density at the left base. No pulmonary edema. Interval development of a trace left pleural effusion. No right pleural effusion. No pneumothorax. No acute osseous abnormality. IMPRESSION: 1. Interval development of trace left pleural effusion. 2. Persistent nodular-like density at the left base. 3. Aortic Atherosclerosis (ICD10-I70.0) and Emphysema (ICD10-J43.9). Electronically Signed   By: Iven Finn M.D.   On: 10/20/2020 20:42   DG Chest Portable 1 View  Result Date: 10/19/2020 CLINICAL DATA:  Abdominal pain and vomiting. EXAM: PORTABLE CHEST 1 VIEW COMPARISON:  April 16, 2018 FINDINGS: The heart size and mediastinal contours are within normal limits. There is marked severity calcification and tortuosity of the thoracic aorta. Both lungs are clear. The visualized skeletal structures are unremarkable. IMPRESSION: No active cardiopulmonary disease. Electronically Signed   By: Virgina Norfolk M.D.   On: 10/19/2020 21:38   DG Abd Portable 1V-Small Bowel Obstruction Protocol-24 hr delay  Result Date: 10/21/2020 CLINICAL DATA:  Small-bowel obstruction. EXAM: PORTABLE ABDOMEN - 1 VIEW COMPARISON:  10/20/2020.  CT 10/19/2020. FINDINGS: Interim removal of NG tube. Recurrent multiple slightly dilated loops  of small bowel noted on today's exam. Colonic gas pattern is unremarkable. Oral contrast has passed from the colon. No free  air. Degenerative change scratched it severe scoliosis lumbar spine. Degenerative changes lumbar spine and both hips. Aortoiliac and peripheral atherosclerotic vascular calcification. IMPRESSION: Interim removal of NG tube. Recurrent multiple slightly dilated loops of small bowel noted on today's exam. Colonic gas pattern is unremarkable. Oral contrast has passed from the colon. Electronically Signed   By: Marcello Moores  Register   On: 10/21/2020 13:11   DG Abd Portable 1V-Small Bowel Obstruction Protocol-initial, 8 hr delay  Result Date: 10/20/2020 CLINICAL DATA:  8 hour delay small bowel protocol. EXAM: PORTABLE ABDOMEN - 1 VIEW COMPARISON:  October 20, 2020 (1:52 a.m.) FINDINGS: A nasogastric tube is seen with its distal tip overlying the expected region of the gastric antrum. The bowel gas pattern is normal. Radiopaque contrast is seen throughout the large bowel and within the lumen of the urinary bladder. No radio-opaque calculi or other significant radiographic abnormality are seen. Marked severity levoscoliosis of the lumbar spine is noted. IMPRESSION: 1. Nasogastric tube positioning, as described above. 2. Radiopaque contrast throughout the large bowel. Electronically Signed   By: Virgina Norfolk M.D.   On: 10/20/2020 21:18        Scheduled Meds: . heparin  5,000 Units Subcutaneous Q8H  . insulin aspart  0-5 Units Subcutaneous QHS  . insulin aspart  0-9 Units Subcutaneous TID WC   Continuous Infusions:   LOS: 2 days     Cordelia Poche, MD [facility] 10/21/2020, 5:22 PM  If 7PM-7AM, please contact night-coverage [email or web address]

## 2020-10-21 NOTE — Progress Notes (Signed)
[facility] Progress Note     Subjective: CC:  Denies abd pain, nausea, or vomiting. Reports flatus and multiple nonbloody stools late yesterday. Was given a few ounces of orange juice via NGT this AM for hypoglycemia, NG clamped. Tolerating this so far. Denies abdominal distention - feels her abd is almost back to its normal size.  At baseline pt states she mobilizes independently without an assistive device. States she used a walker for a short period after surgery in 2014 but has not needed it recently.  Objective: Vital signs in last 24 hours: Temp:  [97.7 F (36.5 C)-98.6 F (37 C)] 98.1 F (36.7 C) (01/19 0535) Pulse Rate:  [75-90] 89 (01/19 0535) Resp:  [18] 18 (01/19 0535) BP: (90-165)/(64-80) 165/69 (01/19 0535) SpO2:  [84 %-97 %] 86 % (01/19 0535) Last BM Date: 10/20/20  Intake/Output from previous day: 01/18 0701 - 01/19 0700 In: 929 [P.O.:60; I.V.:869] Out: 160 [Emesis/NG output:160] Intake/Output this shift: No intake/output data recorded.  PE: Gen:  Alert, NAD, pleasant Card:  Regular rate and rhythm, pedal pulses 2+ BL Pulm:  Normal effort, clear to auscultation bilaterally Abd: Soft, non-tender, mild distention. bowel sounds present in all 4 quadrants, no HSM, previous laparotomy incision noted. NG tube removed during my exam. Skin: warm and dry, no rashes  Psych: A&Ox3   Lab Results:  Recent Labs    10/20/20 0440 10/21/20 0518  WBC 8.5 5.2  HGB 14.4 13.0  HCT 46.7* 42.0  PLT 138* 112*   BMET Recent Labs    10/20/20 0440 10/21/20 0518  NA 138 143  K 4.7 3.6  CL 100 104  CO2 27 27  GLUCOSE 134* 86  BUN 40* 27*  CREATININE 1.41* 0.90  CALCIUM 9.6 8.8*   PT/INR No results for input(s): LABPROT, INR in the last 72 hours. CMP     Component Value Date/Time   NA 143 10/21/2020 0518   K 3.6 10/21/2020 0518   CL 104 10/21/2020 0518   CO2 27 10/21/2020 0518   GLUCOSE 86 10/21/2020 0518   BUN 27 (H) 10/21/2020 0518   CREATININE  0.90 10/21/2020 0518   CALCIUM 8.8 (L) 10/21/2020 0518   PROT 5.5 (L) 10/21/2020 0518   ALBUMIN 3.0 (L) 10/21/2020 0518   AST 18 10/21/2020 0518   ALT 16 10/21/2020 0518   ALKPHOS 54 10/21/2020 0518   BILITOT 1.2 10/21/2020 0518   GFRNONAA >60 10/21/2020 0518   GFRAA >60 06/23/2018 0507   Lipase     Component Value Date/Time   LIPASE 90 (H) 10/19/2020 1415       Studies/Results: DG Abdomen 1 View  Result Date: 10/20/2020 CLINICAL DATA:  NG tube EXAM: ABDOMEN - 1 VIEW COMPARISON:  June 21, 2018 FINDINGS: The enteric tube projects over the gastric body. The tip is pointed distally. There is contrast within the left renal collecting system with apparent mild left-sided hydronephrosis which is new since prior study. The bladder is distended. The bowel gas pattern is nonspecific. IMPRESSION: 1. Enteric tube projects over the gastric body. 2. New mild left-sided hydronephrosis.  Distended urinary bladder. 3. Nonspecific bowel gas pattern. Electronically Signed   By: Constance Holster M.D.   On: 10/20/2020 02:05   CT ABDOMEN PELVIS W CONTRAST  Result Date: 10/19/2020 CLINICAL DATA:  Bowel obstruction suspected, increasing abdominal distension EXAM: CT ABDOMEN AND PELVIS WITH CONTRAST TECHNIQUE: Multidetector CT imaging of the abdomen and pelvis was performed using the standard protocol following bolus administration of intravenous  contrast. CONTRAST:  80mL OMNIPAQUE IOHEXOL 300 MG/ML  SOLN COMPARISON:  Multiple priors, most recent CT 06/18/2018 FINDINGS: Lower chest: Grossly stable appearance of a 5 mm sub solid nodule in the right lower lobe (4/8). Few additional 2-3 mm subpleural micronodule seen in the right lung base, possibly post infectious or inflammatory with additional bandlike areas of scarring and architectural distortion in both lower lobes. Small pericardial effusion, similar to comparison exams. Coronary atherosclerosis is present. Cardiac size within normal limits.  Hepatobiliary: Stable appearance of a calcified hepatic granuloma. Normal hepatic attenuation. Calcified, dependently layering gallstone in the otherwise normal gallbladder. Mild intra and extrahepatic biliary ductal dilatation with the common bile duct measuring up to 11 mm, slightly greater than expected for senescent change though similar to comparison imaging. No visible intraductal gallstones. Pancreas: Mild to moderate pancreatic atrophy. No concerning pancreatic lesion. No pancreatic ductal dilatation or surrounding inflammatory changes. Spleen: Normal in size. No concerning splenic lesions. Adrenals/Urinary Tract: Normal adrenal glands. Kidneys are normally located with symmetric enhancement and excretion. Multiple fluid attenuation cysts present in both kidneys. Largest is a 7.1 cm exophytic cyst arising from the lower pole left kidney. No concerning renal lesions. Left extrarenal pelvis is similar to priors. No suspicious renal lesion, urolithiasis or hydronephrosis. No conspicuous bladder wall thickening or debris. Left posterolateral bladder diverticulum, similar to prior. Stomach/Bowel: Distal esophagus and stomach are unremarkable. Duodenum is borderline distended and fluid-filled. There is some proximal segmental thickening of the jejunal loops in the upper abdomen as well with a questionable transition point in the mid abdomen (2/29) more distal small bowel is diffusely fluid-filled as well but of a more normal caliber. Appendix is not visualized. No focal inflammation the vicinity of the cecum to suggest an occult appendicitis. No colonic dilatation or wall thickening. Scattered colonic diverticula without focal inflammation to suggest diverticulitis. Vascular/Lymphatic: Atherosclerotic calcifications within the abdominal aorta and branch vessels. There is multilobulated fusiform infrarenal abdominal aortic dilatation measuring up to 3 cm in maximal diameter. No clear extension of dilatation beyond  the aortic bifurcation. Appearance is similar to comparison exam. No acute luminal abnormality is seen. No suspicious or enlarged lymph nodes in the included lymphatic chains. Reproductive: Atrophic appearance of a retroverted uterus. Several small fluid attenuation cysts are seen in the left adnexa, not significantly changed from comparison exam. Additionally, a 4 cm fluid attenuation structure previously seen in the right adnexa now appears displaced posterolaterally towards the left adnexa and could reflect some rotation albeit without adjacent inflammation to suggest a torsion. Other: Abdominopelvic free air or fluid. No bowel containing hernias. Mild body wall edema. Musculoskeletal: Severe levocurvature of the lumbar spine, apex L2. Multilevel discogenic and facet degenerative changes are present throughout the spine. Additional degenerative changes in the hips and pelvis. IMPRESSION: 1. There is dilatation of the proximal small bowel with areas of segmental thickening in the of the jejunal loops in the upper abdomen as well with a questionable transition point in the mid abdomen (2/29). More distal small bowel is diffusely fluid-filled as well as of a more normal caliber. Findings are suggestive of enteritis with a possible early or partial small bowel obstruction. 2. Cholelithiasis without evidence of acute cholecystitis. Mild intra and extrahepatic biliary ductal dilatation with the common bile duct measuring up to 11 mm, slightly greater than expected for senescent change though similar to comparison imaging. Correlate with liver serologies and consider further evaluation with MRCP if clinically warranted. This recommendation follows ACR consensus guidelines: White Paper of  the ACR Incidental Findings Committee II on Gallbladder and Biliary Findings. J Am Coll Radiol 2013:;10:953-956. 3. A 4 cm fluid attenuation structure previously seen in the right adnexa now appears displaced posterolaterally towards  the left adnexa and could reflect some rotation albeit without adjacent inflammation to suggest a torsion. If there is clinical concern could consider pelvic ultrasound for further interrogation. Otherwise at minimum, a follow-up ultrasound of the adnexal lesions in 6-12 months should be obtained note: This recommendation does not apply to premenarchal patients and to those with increased risk (genetic, family history, elevated tumor markers or other high-risk factors) of ovarian cancer. Reference: JACR 2020 Feb; 17(2):248-254 4. Stable fusiform infrarenal abdominal aortic dilatation measuring up to 3 cm in maximal diameter. Recommend follow-up ultrasound every 3 years. This recommendation follows ACR consensus guidelines: White Paper of the ACR Incidental Findings Committee II on Vascular Findings. J Am Coll Radiol 2013CJ:3944253. 5. Aortic Atherosclerosis (ICD10-I70.0). Electronically Signed   By: Lovena Le M.D.   On: 10/19/2020 22:42   DG CHEST PORT 1 VIEW  Result Date: 10/20/2020 CLINICAL DATA:  Hypoxia, shortness of breath. EXAM: PORTABLE CHEST 1 VIEW COMPARISON:  Chest x-ray 10/19/2020, chest x-ray chest x-ray 10/19/2020, CT abdomen pelvis 06/18/2018 FINDINGS: Enteric tube courses below the hemidiaphragm with tip and side port collimated off view. The heart size and mediastinal contours are unchanged. Aortic arch calcification. Flattening of the hemidiaphragms suggestive of emphysematous changes. Redemonstration of nodular-like density at the left base. No pulmonary edema. Interval development of a trace left pleural effusion. No right pleural effusion. No pneumothorax. No acute osseous abnormality. IMPRESSION: 1. Interval development of trace left pleural effusion. 2. Persistent nodular-like density at the left base. 3. Aortic Atherosclerosis (ICD10-I70.0) and Emphysema (ICD10-J43.9). Electronically Signed   By: Iven Finn M.D.   On: 10/20/2020 20:42   DG Chest Portable 1 View  Result Date:  10/19/2020 CLINICAL DATA:  Abdominal pain and vomiting. EXAM: PORTABLE CHEST 1 VIEW COMPARISON:  April 16, 2018 FINDINGS: The heart size and mediastinal contours are within normal limits. There is marked severity calcification and tortuosity of the thoracic aorta. Both lungs are clear. The visualized skeletal structures are unremarkable. IMPRESSION: No active cardiopulmonary disease. Electronically Signed   By: Virgina Norfolk M.D.   On: 10/19/2020 21:38   DG Abd Portable 1V-Small Bowel Obstruction Protocol-initial, 8 hr delay  Result Date: 10/20/2020 CLINICAL DATA:  8 hour delay small bowel protocol. EXAM: PORTABLE ABDOMEN - 1 VIEW COMPARISON:  October 20, 2020 (1:52 a.m.) FINDINGS: A nasogastric tube is seen with its distal tip overlying the expected region of the gastric antrum. The bowel gas pattern is normal. Radiopaque contrast is seen throughout the large bowel and within the lumen of the urinary bladder. No radio-opaque calculi or other significant radiographic abnormality are seen. Marked severity levoscoliosis of the lumbar spine is noted. IMPRESSION: 1. Nasogastric tube positioning, as described above. 2. Radiopaque contrast throughout the large bowel. Electronically Signed   By: Virgina Norfolk M.D.   On: 10/20/2020 21:18    Anti-infectives: Anti-infectives (From admission, onward)   None      Assessment/Plan History of factor V Leiden with prior DVT - Not on blood thinners prior to admission DM2 - SSI per TRH Hx HTN - Per TRH Hx HLD 4 cm fluid attenuation structure displaced posterior laterally towards left adnexa - Per TR[facility]  Infrarenal abdominal aortic dilatation - Noted on CT. Radiology recommends follow-up ultrasound every 3 years AKI - resolved, Cr 0.9  this AM Urinary retention -resolved Cholelithiasis - No evidence of cholecystitis on imaging. No RUQ tenderness Dilated CBD - 109mm on CT. Trend LFT's to determine if needs MRCP. Currently wnl and without RUQ abdominal pain.  She does have chronically elevated lipase (90 on 1/17)   pSBO - Patient with hx of appendectomy, abdominal hysterectomy, and exploratory laparotomy for removal of FB (2014) - NGT and SBO protocol >> contrast in the colon and bowel function returned - NG removed, start clears and ADAT to soft. - Keep K > 4 and Mg > 2 for bowel function - We will follow with you  FEN - CLD, advance as tolerated VTE - SCDs, Lovenox ID - None Foley - none, voiding independently   LOS: 2 days    Obie Dredge, [facility] Please see Amion for pager number during day hours 7:00am-4:30pm

## 2020-10-22 ENCOUNTER — Inpatient Hospital Stay (HOSPITAL_COMMUNITY): Payer: PPO

## 2020-10-22 LAB — GLUCOSE, CAPILLARY
Glucose-Capillary: 98 mg/dL (ref 70–99)
Glucose-Capillary: 101 mg/dL — ABNORMAL HIGH (ref 70–99)
Glucose-Capillary: 115 mg/dL — ABNORMAL HIGH (ref 70–99)
Glucose-Capillary: 111 mg/dL — ABNORMAL HIGH (ref 70–99)

## 2020-10-22 MED ORDER — SODIUM CHLORIDE 0.9 % IV SOLN
2.0000 g | INTRAVENOUS | Status: DC
  Administered 2020-10-22: 16:00:00 2 g via INTRAVENOUS
  Filled 2020-10-22: qty 20
  Filled 2020-10-22: qty 2

## 2020-10-22 MED ORDER — AZITHROMYCIN 250 MG PO TABS
500.0000 mg | ORAL_TABLET | Freq: Every day | ORAL | Status: DC
  Administered 2020-10-22 – 2020-10-23 (×2): 500 mg via ORAL
  Filled 2020-10-22 (×2): qty 2

## 2020-10-22 NOTE — Care Management Important Message (Signed)
Important Message  Patient Details IM Letter given to the Patient. Name: [name] MRN: [id number] Date of Birth: 14-Sep-1929   Medicare Important Message Given:  Yes     Kerin Salen 10/22/2020, 11:48 AM

## 2020-10-22 NOTE — Progress Notes (Signed)
SATURATION QUALIFICATIONS: (This note is used to comply with regulatory documentation for home oxygen)  Patient Saturations on Room Air at Rest = 90%  Patient Saturations on Room Air while Ambulating = 69%  Patient Saturations on 3 Liters of oxygen while Ambulating = 93 %

## 2020-10-22 NOTE — Progress Notes (Addendum)
[facility] Progress Note     Subjective: CC:  Ongoing flatus and stool. Pain resolved. Tolerated soft diet for breakfast without N/V  Objective: Vital signs in last 24 hours: Temp:  [98.1 F (36.7 C)-98.3 F (36.8 C)] 98.1 F (36.7 C) (01/20 0600) Pulse Rate:  [66-69] 69 (01/20 0600) Resp:  [12-18] 18 (01/20 0600) BP: (142-175)/(60-89) 142/89 (01/20 0600) SpO2:  [87 %-95 %] 87 % (01/20 0600) Last BM Date: 10/21/20  Intake/Output from previous day: 01/19 0701 - 01/20 0700 In: 1800 [P.O.:1800] Out: 1350 [Urine:1350] Intake/Output this shift: No intake/output data recorded.  PE: Gen:  Alert, NAD, pleasant Card:  Regular rate and rhythm, pedal pulses 2+ BL Pulm:  Normal effort, clear to auscultation bilaterally Abd: Soft, non-tender, mild distention. bowel sounds present in all 4 quadrants, no HSM, previous laparotomy incision noted Skin: warm and dry, no rashes  Psych: A&Ox3   Lab Results:  Recent Labs    10/20/20 0440 10/21/20 0518  WBC 8.5 5.2  HGB 14.4 13.0  HCT 46.7* 42.0  PLT 138* 112*   BMET Recent Labs    10/20/20 0440 10/21/20 0518  NA 138 143  K 4.7 3.6  CL 100 104  CO2 27 27  GLUCOSE 134* 86  BUN 40* 27*  CREATININE 1.41* 0.90  CALCIUM 9.6 8.8*   PT/INR No results for input(s): LABPROT, INR in the last 72 hours. CMP     Component Value Date/Time   NA 143 10/21/2020 0518   K 3.6 10/21/2020 0518   CL 104 10/21/2020 0518   CO2 27 10/21/2020 0518   GLUCOSE 86 10/21/2020 0518   BUN 27 (H) 10/21/2020 0518   CREATININE 0.90 10/21/2020 0518   CALCIUM 8.8 (L) 10/21/2020 0518   PROT 5.5 (L) 10/21/2020 0518   ALBUMIN 3.0 (L) 10/21/2020 0518   AST 18 10/21/2020 0518   ALT 16 10/21/2020 0518   ALKPHOS 54 10/21/2020 0518   BILITOT 1.2 10/21/2020 0518   GFRNONAA >60 10/21/2020 0518   GFRAA >60 06/23/2018 0507   Lipase     Component Value Date/Time   LIPASE 90 (H) 10/19/2020 1415       Studies/Results: DG CHEST PORT 1  VIEW  Result Date: 10/20/2020 CLINICAL DATA:  Hypoxia, shortness of breath. EXAM: PORTABLE CHEST 1 VIEW COMPARISON:  Chest x-ray 10/19/2020, chest x-ray chest x-ray 10/19/2020, CT abdomen pelvis 06/18/2018 FINDINGS: Enteric tube courses below the hemidiaphragm with tip and side port collimated off view. The heart size and mediastinal contours are unchanged. Aortic arch calcification. Flattening of the hemidiaphragms suggestive of emphysematous changes. Redemonstration of nodular-like density at the left base. No pulmonary edema. Interval development of a trace left pleural effusion. No right pleural effusion. No pneumothorax. No acute osseous abnormality. IMPRESSION: 1. Interval development of trace left pleural effusion. 2. Persistent nodular-like density at the left base. 3. Aortic Atherosclerosis (ICD10-I70.0) and Emphysema (ICD10-J43.9). Electronically Signed   By: Iven Finn M.D.   On: 10/20/2020 20:42   DG Abd Portable 1V-Small Bowel Obstruction Protocol-24 hr delay  Result Date: 10/21/2020 CLINICAL DATA:  Small-bowel obstruction. EXAM: PORTABLE ABDOMEN - 1 VIEW COMPARISON:  10/20/2020.  CT 10/19/2020. FINDINGS: Interim removal of NG tube. Recurrent multiple slightly dilated loops of small bowel noted on today's exam. Colonic gas pattern is unremarkable. Oral contrast has passed from the colon. No free air. Degenerative change scratched it severe scoliosis lumbar spine. Degenerative changes lumbar spine and both hips. Aortoiliac and peripheral atherosclerotic vascular calcification. IMPRESSION: Interim removal  of NG tube. Recurrent multiple slightly dilated loops of small bowel noted on today's exam. Colonic gas pattern is unremarkable. Oral contrast has passed from the colon. Electronically Signed   By: Marcello Moores  Register   On: 10/21/2020 13:11   DG Abd Portable 1V-Small Bowel Obstruction Protocol-initial, 8 hr delay  Result Date: 10/20/2020 CLINICAL DATA:  8 hour delay small bowel protocol.  EXAM: PORTABLE ABDOMEN - 1 VIEW COMPARISON:  October 20, 2020 (1:52 a.m.) FINDINGS: A nasogastric tube is seen with its distal tip overlying the expected region of the gastric antrum. The bowel gas pattern is normal. Radiopaque contrast is seen throughout the large bowel and within the lumen of the urinary bladder. No radio-opaque calculi or other significant radiographic abnormality are seen. Marked severity levoscoliosis of the lumbar spine is noted. IMPRESSION: 1. Nasogastric tube positioning, as described above. 2. Radiopaque contrast throughout the large bowel. Electronically Signed   By: Virgina Norfolk M.D.   On: 10/20/2020 21:18    Anti-infectives: Anti-infectives (From admission, onward)   None      Assessment/Plan History of factor V Leiden with prior DVT - Not on blood thinners prior to admission DM2 - SSI per TRH Hx HTN - Per TRH Hx HLD 4 cm fluid attenuation structure displaced posterior laterally towards left adnexa - Per TRH  Infrarenal abdominal aortic dilatation - Noted on CT. Radiology recommends follow-up ultrasound every 3 years AKI - resolved, Cr 0.9  this AM Urinary retention -resolved Cholelithiasis - No evidence of cholecystitis on imaging. No RUQ tenderness Dilated CBD - 21mm on CT. Trend LFT's to determine if needs MRCP. Currently wnl and without RUQ abdominal pain. She does have chronically elevated lipase (90 on 1/17)   pSBO - Patient with hx of appendectomy, abdominal hysterectomy, and exploratory laparotomy for removal of FB (2014) - NGT and SBO protocol >> contrast in the colon and bowel function returned - NG removed, tolerating SOFT diet - stable for discharge from surgical perspective, timing of D/C per TRH.  CCS will sign off - call as needed.  FEN - SOFT VTE - SCDs, Lovenox ID - None Foley - none, voiding independently   LOS: 3 days    Obie Dredge, [facility] Please see Amion for pager number during day hours  7:00am-4:30pm

## 2020-10-22 NOTE — Progress Notes (Addendum)
PROGRESS NOTE    [name]  [id number] DOB: June 08, 1929 DOA: 10/19/2020 PCP: Carol Ada, MD   Brief Narrative: [name] is a 87 y.o. female with a history of recurrent SBO, factor v leiden with history of DVT, diabetes mellitus, hypertension, hyperlipidemia, CKD stage II. Patient presented secondary to abdominal pain, nausea and vomiting with evidence of partial small bowel obstruction. NG tube placed for decompression. SBO protocol initiated and general surgery consulted. Non-operative management.   Assessment & Plan:   Principal Problem:   Partial small bowel obstruction (HCC) Active Problems:   HTN (hypertension)   Dehydration   Anemia in chronic kidney disease   Hyperlipidemia   Diabetes mellitus (HCC)   Factor V Leiden (HCC)   Hypercalcemia  Partial small bowel obstruction General surgery consulted. Conservative management with NG tube placed on 1/17. NG tube removed on 1/19 with improvement of bowel function. -General surgery recommendations: Soft diet -Ambulate with nursing  Primary hypertension Slightly uncontrolled. Patient is on amlodipine, metoprolol XL and olmesartan as an outpatient which were held secondary to above -Continue home amlodipine and metoprolol  Diabetes mellitus, type 2 Hemoglobin A1C of 5.5%. Not on outpatient regimen. -Continue SSI   AKI Baseline creatinine of 0.9. Creatinine of 1.46 on admission. AKI resolved with IV fluids.  Acute respiratory failure with hypoxia Unsure of etiology. Chest x-ray not very remarkable for etiology. Patient with severe hypoxia with ambulation -Repeat chest x-ray; if non-diagnostic, will obtained CT chest -Wean to room air  Hypercalcemia Mild. Improved with IV fluids.  Hyperlipidemia Patient is on Crestor as an outpatient which was held on admission secondary to partial SBO.  Anemia of chronic disease Stable.  Thrombocytopenia Mild. Likely reactive.   DVT prophylaxis: Heparin  subq Code Status:   Code Status: Full Code Family Communication: None at bedside Disposition Plan: Discharge home likely in 1-2 days pending successful advancement of diet in addition to evaluation of hypoxia   Consultants:   General surgery  Procedures:   None  Antimicrobials:  None    Subjective: No abdominal pain. Continues to have bowel movements  Objective: Vitals:   10/21/20 1358 10/21/20 1740 10/21/20 2007 10/22/20 0600  BP: (!) 175/63 (!) 154/69 (!) 169/60 (!) 142/89  Pulse: 68 66 67 69  Resp: 16 12 16 18   Temp: 98.2 F (36.8 C) 98.3 F (36.8 C) 98.2 F (36.8 C) 98.1 F (36.7 C)  TempSrc: Oral Oral Oral Oral  SpO2: 91% 95% 92% (!) 87%    Intake/Output Summary (Last 24 hours) at 10/22/2020 1317 Last data filed at 10/22/2020 1000 Gross per 24 hour  Intake 1320 ml  Output 1050 ml  Net 270 ml   There were no vitals filed for this visit.  Examination:  General exam: Appears calm and comfortable Respiratory system: Diminished. Respiratory effort normal. Cardiovascular system: S1 & S2 heard, RRR. No murmurs, rubs, gallops or clicks. Gastrointestinal system: Abdomen is distended, soft and nontender. No organomegaly or masses felt. Normal bowel sounds heard. Central nervous system: Alert and oriented. No focal neurological deficits. Musculoskeletal: No edema. No calf tenderness Skin: No cyanosis. No rashes Psychiatry: Judgement and insight appear normal. Mood & affect appropriate.     Data Reviewed: I have personally reviewed following labs and imaging studies  CBC Lab Results  Component Value Date   WBC 5.2 10/21/2020   RBC 4.24 10/21/2020   HGB 13.0 10/21/2020   HCT 42.0 10/21/2020   MCV 99.1 10/21/2020   MCH 30.7 10/21/2020  PLT 112 (L) 10/21/2020   MCHC 31.0 10/21/2020   RDW 14.3 10/21/2020   LYMPHSABS 1.8 12/31/2012   MONOABS 0.6 12/31/2012   EOSABS 0.0 12/31/2012   BASOSABS 0.0 94/76/5465     Last metabolic panel Lab Results   Component Value Date   NA 143 10/21/2020   K 3.6 10/21/2020   CL 104 10/21/2020   CO2 27 10/21/2020   BUN 27 (H) 10/21/2020   CREATININE 0.90 10/21/2020   GLUCOSE 86 10/21/2020   GFRNONAA >60 10/21/2020   GFRAA >60 06/23/2018   CALCIUM 8.8 (L) 10/21/2020   PHOS 6.6 (H) 01/28/2013   PROT 5.5 (L) 10/21/2020   ALBUMIN 3.0 (L) 10/21/2020   BILITOT 1.2 10/21/2020   ALKPHOS 54 10/21/2020   AST 18 10/21/2020   ALT 16 10/21/2020   ANIONGAP 12 10/21/2020    CBG (last 3)  Recent Labs    10/21/20 2029 10/22/20 0756 10/22/20 1207  GLUCAP 78 98 101*     GFR: CrCl cannot be calculated (Unknown ideal weight.).  Coagulation Profile: No results for input(s): INR, PROTIME in the last 168 hours.  Recent Results (from the past 240 hour(s))  Resp Panel by RT-PCR (Flu A&B, Covid) Nasopharyngeal Swab     Status: None   Collection Time: 10/19/20 10:49 PM   Specimen: Nasopharyngeal Swab; Nasopharyngeal(NP) swabs in vial transport medium  Result Value Ref Range Status   SARS Coronavirus 2 by RT PCR NEGATIVE NEGATIVE Final    Comment: (NOTE) SARS-CoV-2 target nucleic acids are NOT DETECTED.  The SARS-CoV-2 RNA is generally detectable in upper respiratory specimens during the acute phase of infection. The lowest concentration of SARS-CoV-2 viral copies this assay can detect is 138 copies/mL. A negative result does not preclude SARS-Cov-2 infection and should not be used as the sole basis for treatment or other patient management decisions. A negative result may occur with  improper specimen collection/handling, submission of specimen other than nasopharyngeal swab, presence of viral mutation(s) within the areas targeted by this assay, and inadequate number of viral copies(<138 copies/mL). A negative result must be combined with clinical observations, patient history, and epidemiological information. The expected result is Negative.  Fact Sheet for Patients:   EntrepreneurPulse.com.au  Fact Sheet for Healthcare Providers:  IncredibleEmployment.be  This test is no t yet approved or cleared by the [place] FDA and  has been authorized for detection and/or diagnosis of SARS-CoV-2 by FDA under an Emergency Use Authorization (EUA). This EUA will remain  in effect (meaning this test can be used) for the duration of the COVID-19 declaration under Section 564(b)(1) of the Act, 21 U.S.C.section 360bbb-3(b)(1), unless the authorization is terminated  or revoked sooner.       Influenza A by PCR NEGATIVE NEGATIVE Final   Influenza B by PCR NEGATIVE NEGATIVE Final    Comment: (NOTE) The Xpert Xpress SARS-CoV-2/FLU/RSV plus assay is intended as an aid in the diagnosis of influenza from Nasopharyngeal swab specimens and should not be used as a sole basis for treatment. Nasal washings and aspirates are unacceptable for Xpert Xpress SARS-CoV-2/FLU/RSV testing.  Fact Sheet for Patients: EntrepreneurPulse.com.au  Fact Sheet for Healthcare Providers: IncredibleEmployment.be  This test is not yet approved or cleared by the [place] FDA and has been authorized for detection and/or diagnosis of SARS-CoV-2 by FDA under an Emergency Use Authorization (EUA). This EUA will remain in effect (meaning this test can be used) for the duration of the COVID-19 declaration under Section 564(b)(1) of the Act, 21  U.S.C. section 360bbb-3(b)(1), unless the authorization is terminated or revoked.  Performed at [facility], [place] [address] [place], [place] [address]         Radiology Studies: DG CHEST PORT 1 VIEW  Result Date: 10/20/2020 CLINICAL DATA:  Hypoxia, shortness of breath. EXAM: PORTABLE CHEST 1 VIEW COMPARISON:  Chest x-ray 10/19/2020, chest x-ray chest x-ray 10/19/2020, CT abdomen pelvis 06/18/2018 FINDINGS: Enteric tube courses below the hemidiaphragm  with tip and side port collimated off view. The heart size and mediastinal contours are unchanged. Aortic arch calcification. Flattening of the hemidiaphragms suggestive of emphysematous changes. Redemonstration of nodular-like density at the left base. No pulmonary edema. Interval development of a trace left pleural effusion. No right pleural effusion. No pneumothorax. No acute osseous abnormality. IMPRESSION: 1. Interval development of trace left pleural effusion. 2. Persistent nodular-like density at the left base. 3. Aortic Atherosclerosis (ICD10-I70.0) and Emphysema (ICD10-J43.9). Electronically Signed   By: Iven Finn M.D.   On: 10/20/2020 20:42   DG Abd Portable 1V-Small Bowel Obstruction Protocol-24 hr delay  Result Date: 10/21/2020 CLINICAL DATA:  Small-bowel obstruction. EXAM: PORTABLE ABDOMEN - 1 VIEW COMPARISON:  10/20/2020.  CT 10/19/2020. FINDINGS: Interim removal of NG tube. Recurrent multiple slightly dilated loops of small bowel noted on today's exam. Colonic gas pattern is unremarkable. Oral contrast has passed from the colon. No free air. Degenerative change scratched it severe scoliosis lumbar spine. Degenerative changes lumbar spine and both hips. Aortoiliac and peripheral atherosclerotic vascular calcification. IMPRESSION: Interim removal of NG tube. Recurrent multiple slightly dilated loops of small bowel noted on today's exam. Colonic gas pattern is unremarkable. Oral contrast has passed from the colon. Electronically Signed   By: Marcello Moores  Register   On: 10/21/2020 13:11   DG Abd Portable 1V-Small Bowel Obstruction Protocol-initial, 8 hr delay  Result Date: 10/20/2020 CLINICAL DATA:  8 hour delay small bowel protocol. EXAM: PORTABLE ABDOMEN - 1 VIEW COMPARISON:  October 20, 2020 (1:52 a.m.) FINDINGS: A nasogastric tube is seen with its distal tip overlying the expected region of the gastric antrum. The bowel gas pattern is normal. Radiopaque contrast is seen throughout the large  bowel and within the lumen of the urinary bladder. No radio-opaque calculi or other significant radiographic abnormality are seen. Marked severity levoscoliosis of the lumbar spine is noted. IMPRESSION: 1. Nasogastric tube positioning, as described above. 2. Radiopaque contrast throughout the large bowel. Electronically Signed   By: Virgina Norfolk M.D.   On: 10/20/2020 21:18        Scheduled Meds: . amLODipine  10 mg Oral Daily  . heparin  5,000 Units Subcutaneous Q8H  . insulin aspart  0-5 Units Subcutaneous QHS  . insulin aspart  0-9 Units Subcutaneous TID WC  . metoprolol succinate  25 mg Oral Daily   Continuous Infusions:   LOS: 3 days     Cordelia Poche, MD [facility] 10/22/2020, 1:17 PM  If 7PM-7AM, please contact night-coverage [email or web address]

## 2020-10-23 LAB — GLUCOSE, CAPILLARY: Glucose-Capillary: 98 mg/dL (ref 70–99)

## 2020-10-23 LAB — STREP PNEUMONIAE URINARY ANTIGEN: Strep Pneumo Urinary Antigen: NEGATIVE

## 2020-10-23 MED ORDER — ONDANSETRON HCL 4 MG PO TABS
4.0000 mg | ORAL_TABLET | Freq: Four times a day (QID) | ORAL | 0 refills | Status: AC | PRN
Start: 2020-10-23 — End: ?

## 2020-10-23 MED ORDER — AZITHROMYCIN 250 MG PO TABS: 250.0000 mg | ORAL_TABLET | Freq: Every day | ORAL | 0 refills | Status: AC

## 2020-10-23 MED ORDER — ALBUTEROL SULFATE HFA 108 (90 BASE) MCG/ACT IN AERS: 2.0000 | INHALATION_SPRAY | Freq: Four times a day (QID) | RESPIRATORY_TRACT | 2 refills | Status: AC | PRN

## 2020-10-23 NOTE — Discharge Summary (Signed)
Physician Discharge Summary  [name] [id number] DOB: [birth year] DOA: 10/19/2020  PCP: Carol Ada, MD  Admit date: 10/19/2020  Discharge date: 10/23/2020  Admitted From:Home  Disposition:  Home  Recommendations for Outpatient Follow-up:  1. Follow up with PCP in 1-2 weeks 2. Follow-up with GI outpatient for common bile duct dilation to evaluate for MRCP 3. Follow-up ultrasound of aorta every 3 years given infrarenal abdominal aortic dilation 4. Continue on azithromycin for several more days to treat presumed community-acquired pneumonia 5. Inhaler given as needed for shortness of breath or wheezing 6. Continue other home medications as prior  Home Health: None  Equipment/Devices: None  Discharge Condition:Stable  CODE STATUS: Full  Diet recommendation: Soft diet  Brief/Interim Summary: [name] is a 87 y.o. female with a history of recurrent SBO, factor v leiden with history of DVT, diabetes mellitus, hypertension, hyperlipidemia, CKD stage II. Patient presented secondary to abdominal pain, nausea and vomiting with evidence of partial small bowel obstruction. NG tube placed for decompression. SBO protocol initiated and general surgery consulted.  Patient underwent nonoperative management of her partial small bowel obstruction and is now tolerating soft diet and having bowel movements with no further nausea or vomiting.  She was also noted to have some mild AKI which has also resolved with use of IV fluids.  Finally, she was noted to have some mild hypoxemic respiratory failure and chest x-ray findings were significant for community-acquired pneumonia for which she was started on Rocephin and azithromycin.  On day of discharge, she has no further symptomatology related to her bowel obstruction or pneumonia and she will be discharged with azithromycin to finish course of treatment.  She is on room air and stable for discharge today with follow-up as noted above.  She  does have incidental finding of common bile duct dilation which will need outpatient GI evaluation as recommended above.  Discharge Diagnoses:  Principal Problem:   Partial small bowel obstruction (HCC) Active Problems:   HTN (hypertension)   Dehydration   Anemia in chronic kidney disease   Hyperlipidemia   Diabetes mellitus ([place])   Factor V Leiden ([place])   Hypercalcemia    Discharge Instructions  Discharge Instructions    Diet - low sodium heart healthy   Complete by: As directed    Increase activity slowly   Complete by: As directed      Allergies as of 10/23/2020      Reactions   Diovan [valsartan] Other (See Comments)   gas   Hctz [hydrochlorothiazide]    gas   Tekamlo [aliskiren-amlodipine]    gas   Valturna [aliskiren-valsartan]    Gas      Medication List    TAKE these medications   acetaminophen 325 MG tablet Commonly known as: TYLENOL Take 2 tablets (650 mg total) by mouth every 4 (four) hours as needed. What changed: reasons to take this   albuterol 108 (90 Base) MCG/ACT inhaler Commonly known as: VENTOLIN HFA Inhale 2 puffs into the lungs every 6 (six) hours as needed for wheezing or shortness of breath.   amLODipine 10 MG tablet Commonly known as: NORVASC Take 10 mg by mouth daily.   aspirin EC 81 MG tablet Take 81 mg by mouth at bedtime.   azithromycin 250 MG tablet Commonly known as: ZITHROMAX Take 1 tablet (250 mg total) by mouth daily for 4 days.   calcium carbonate 500 MG chewable tablet Commonly known as: TUMS - dosed in mg elemental calcium Chew 1-2  tablets (200-400 mg of elemental calcium total) by mouth 3 (three) times daily as needed. What changed: reasons to take this   cholecalciferol 1000 units tablet Commonly known as: VITAMIN D Take 1,000 Units by mouth at bedtime.   DentaGel 1.1 % Gel dental gel Generic drug: sodium fluoride Take 1 application by mouth daily. Place on toothbrush and brush daily for 2 minutes to prevent  tooth decay/sensitivity   ferrous sulfate 325 (65 FE) MG tablet Take 325 mg by mouth daily with breakfast. Unknown dose   hydroxypropyl methylcellulose / hypromellose 2.5 % ophthalmic solution Commonly known as: ISOPTO TEARS / GONIOVISC Place 1 drop into both eyes as needed for dry eyes.   loratadine 10 MG tablet Commonly known as: CLARITIN Take 10 mg by mouth daily as needed for allergies.   metoprolol succinate 25 MG 24 hr tablet Commonly known as: TOPROL-XL Take 25 mg by mouth daily.   multivitamin with minerals Tabs tablet Take 1 tablet by mouth daily.   olmesartan 20 MG tablet Commonly known as: BENICAR Take 20 mg by mouth daily.   ondansetron 4 MG tablet Commonly known as: ZOFRAN Take 1 tablet (4 mg total) by mouth every 6 (six) hours as needed for nausea.   psyllium 58.6 % packet Commonly known as: METAMUCIL Take 1 packet by mouth at bedtime.   rosuvastatin 5 MG tablet Commonly known as: CRESTOR Take 5 mg by mouth at bedtime.   senna 8.6 MG Tabs tablet Commonly known as: SENOKOT Take 1 tablet (8.6 mg total) by mouth at bedtime.       Follow-up Information    Carol Ada, MD Follow up in 1 week(s).   Specialty: Family Medicine Contact information: [place] [address] [phone]              Allergies  Allergen Reactions  . Diovan [Valsartan] Other (See Comments)    gas  . Hctz [Hydrochlorothiazide]     gas  . Tekamlo [Aliskiren-Amlodipine]     gas  . Valturna [Aliskiren-Valsartan]     Gas     Consultations:  General surgery   Procedures/Studies: DG Abdomen 1 View  Result Date: 10/20/2020 CLINICAL DATA:  NG tube EXAM: ABDOMEN - 1 VIEW COMPARISON:  June 21, 2018 FINDINGS: The enteric tube projects over the gastric body. The tip is pointed distally. There is contrast within the left renal collecting system with apparent mild left-sided hydronephrosis which is new since prior study. The bladder is  distended. The bowel gas pattern is nonspecific. IMPRESSION: 1. Enteric tube projects over the gastric body. 2. New mild left-sided hydronephrosis.  Distended urinary bladder. 3. Nonspecific bowel gas pattern. Electronically Signed   By: Constance Holster M.D.   On: 10/20/2020 02:05   CT ABDOMEN PELVIS W CONTRAST  Result Date: 10/19/2020 CLINICAL DATA:  Bowel obstruction suspected, increasing abdominal distension EXAM: CT ABDOMEN AND PELVIS WITH CONTRAST TECHNIQUE: Multidetector CT imaging of the abdomen and pelvis was performed using the standard protocol following bolus administration of intravenous contrast. CONTRAST:  62mL OMNIPAQUE IOHEXOL 300 MG/ML  SOLN COMPARISON:  Multiple priors, most recent CT 06/18/2018 FINDINGS: Lower chest: Grossly stable appearance of a 5 mm sub solid nodule in the right lower lobe (4/8). Few additional 2-3 mm subpleural micronodule seen in the right lung base, possibly post infectious or inflammatory with additional bandlike areas of scarring and architectural distortion in both lower lobes. Small pericardial effusion, similar to comparison exams. Coronary atherosclerosis is present. Cardiac  size within normal limits. Hepatobiliary: Stable appearance of a calcified hepatic granuloma. Normal hepatic attenuation. Calcified, dependently layering gallstone in the otherwise normal gallbladder. Mild intra and extrahepatic biliary ductal dilatation with the common bile duct measuring up to 11 mm, slightly greater than expected for senescent change though similar to comparison imaging. No visible intraductal gallstones. Pancreas: Mild to moderate pancreatic atrophy. No concerning pancreatic lesion. No pancreatic ductal dilatation or surrounding inflammatory changes. Spleen: Normal in size. No concerning splenic lesions. Adrenals/Urinary Tract: Normal adrenal glands. Kidneys are normally located with symmetric enhancement and excretion. Multiple fluid attenuation cysts present in both  kidneys. Largest is a 7.1 cm exophytic cyst arising from the lower pole left kidney. No concerning renal lesions. Left extrarenal pelvis is similar to priors. No suspicious renal lesion, urolithiasis or hydronephrosis. No conspicuous bladder wall thickening or debris. Left posterolateral bladder diverticulum, similar to prior. Stomach/Bowel: Distal esophagus and stomach are unremarkable. Duodenum is borderline distended and fluid-filled. There is some proximal segmental thickening of the jejunal loops in the upper abdomen as well with a questionable transition point in the mid abdomen (2/29) more distal small bowel is diffusely fluid-filled as well but of a more normal caliber. Appendix is not visualized. No focal inflammation the vicinity of the cecum to suggest an occult appendicitis. No colonic dilatation or wall thickening. Scattered colonic diverticula without focal inflammation to suggest diverticulitis. Vascular/Lymphatic: Atherosclerotic calcifications within the abdominal aorta and branch vessels. There is multilobulated fusiform infrarenal abdominal aortic dilatation measuring up to 3 cm in maximal diameter. No clear extension of dilatation beyond the aortic bifurcation. Appearance is similar to comparison exam. No acute luminal abnormality is seen. No suspicious or enlarged lymph nodes in the included lymphatic chains. Reproductive: Atrophic appearance of a retroverted uterus. Several small fluid attenuation cysts are seen in the left adnexa, not significantly changed from comparison exam. Additionally, a 4 cm fluid attenuation structure previously seen in the right adnexa now appears displaced posterolaterally towards the left adnexa and could reflect some rotation albeit without adjacent inflammation to suggest a torsion. Other: Abdominopelvic free air or fluid. No bowel containing hernias. Mild body wall edema. Musculoskeletal: Severe levocurvature of the lumbar spine, apex L2. Multilevel discogenic  and facet degenerative changes are present throughout the spine. Additional degenerative changes in the hips and pelvis. IMPRESSION: 1. There is dilatation of the proximal small bowel with areas of segmental thickening in the of the jejunal loops in the upper abdomen as well with a questionable transition point in the mid abdomen (2/29). More distal small bowel is diffusely fluid-filled as well as of a more normal caliber. Findings are suggestive of enteritis with a possible early or partial small bowel obstruction. 2. Cholelithiasis without evidence of acute cholecystitis. Mild intra and extrahepatic biliary ductal dilatation with the common bile duct measuring up to 11 mm, slightly greater than expected for senescent change though similar to comparison imaging. Correlate with liver serologies and consider further evaluation with MRCP if clinically warranted. This recommendation follows ACR consensus guidelines: White Paper of the ACR Incidental Findings Committee II on Gallbladder and Biliary Findings. J Am Coll Radiol 2013:;10:953-956. 3. A 4 cm fluid attenuation structure previously seen in the right adnexa now appears displaced posterolaterally towards the left adnexa and could reflect some rotation albeit without adjacent inflammation to suggest a torsion. If there is clinical concern could consider pelvic ultrasound for further interrogation. Otherwise at minimum, a follow-up ultrasound of the adnexal lesions in 6-12 months should be obtained note:  This recommendation does not apply to premenarchal patients and to those with increased risk (genetic, family history, elevated tumor markers or other high-risk factors) of ovarian cancer. Reference: JACR 2020 Feb; 17(2):248-254 4. Stable fusiform infrarenal abdominal aortic dilatation measuring up to 3 cm in maximal diameter. Recommend follow-up ultrasound every 3 years. This recommendation follows ACR consensus guidelines: White Paper of the ACR Incidental  Findings Committee II on Vascular Findings. J Am Coll Radiol 2013JB:6262728. 5. Aortic Atherosclerosis (ICD10-I70.0). Electronically Signed   By: Lovena Le M.D.   On: 10/19/2020 22:42   US ARTERIAL SEGMENTAL EXERCISE (USE FOR CLAUDICATION)  Result Date: 10/09/2020 CLINICAL DATA:  Lower extremity claudication, evaluate for peripheral arterial disease EXAM: NONINVASIVE PHYSIOLOGIC VASCULAR STUDY OF BILATERAL LOWER EXTREMITIES WITH AND WITHOUT EXERCISE TECHNIQUE: Evaluation of both lower extremities were performed at rest, including calculation of ankle-brachial indices, multiple segmental pressure evaluation, segmental Doppler and segmental pulse volume recording. Ankle brachial indices were also obtained following treadmill exercise. FINDINGS: Right Lower Extremity Resting ABI:  0.63 Resting TBI: 0.34 Post Exercise ABI:0.55 Segmental Pressures: Significant pressure gradient between the thigh cuff and popliteal cuff consistent with occlusive disease. Great toe pressure: 46 mm Hg Arterial Waveforms: Abnormal waveforms in the popliteal posterior tibial and dorsalis pedis arteries. PVRs: PVR suggest distal femoropopliteal disease. Left Lower Extremity: Resting ABI: 0.57 Resting TBI: 0.29 Post Exercise ABI:0.24 Segmental Pressures: Asymmetric upper thigh cuff pressure, significantly lower than the contralateral side suggesting an element of inflow disease. Additionally, there is a significant pressure gradient between the distal thigh cuff and calf cuff consistent with distal femoropopliteal disease. Great toe pressure: 39 mm Hg Arterial Waveforms: Abnormal waveforms in the popliteal, posterior tibial and dorsalis pedis stations. PVRs: Blunted PVRs in the calf and at the ankle. Other: Symmetric upper extremity pressures. Ankle Brachial index > 1.4 Non diagnostic secondary to incompressible vessel calcifications 1.0-1.4       Normal 0.9-0.99     Borderline PAD 0.8-0.89     Mild PAD 0.5-0.79     Moderate PAD < 0.5           Severe PAD Toe Brachial Index Normal     >0.65 Moderate  0.53-0.64 Severe     <0.23 Toe Pressures Absolute toe pressure >31mmHg sufficient for wound healing. Toe pressures <20mmHg = critical limb ischemia. IMPRESSION: 1. At least moderate right peripheral arterial disease which appears well collateralized given fairly mild degradation during exercise. 2. Moderate and poorly collateralized left-sided peripheral arterial disease which degrades to severe disease following exercise. Patient developed left calf claudication at approximately 3 minutes. 3. Segmental evaluation suggests predominantly distal femoropopliteal disease on the right and a combination of iliac and distal femoropopliteal disease on the left. Signed, Criselda Peaches, MD, [facility] Electronically Signed   By: Jacqulynn Cadet M.D.   On: 10/09/2020 15:21   DG CHEST PORT 1 VIEW  Result Date: 10/22/2020 CLINICAL DATA:  Hypoxia EXAM: PORTABLE CHEST 1 VIEW COMPARISON:  October 20, 2020 FINDINGS: Ill-defined airspace opacity in the lateral left base persists. Lungs elsewhere are clear. Heart is upper normal in size with pulmonary vascularity normal. There is aortic atherosclerosis. No bone lesions. IMPRESSION: Airspace opacity lateral left base is stable and concerning for focus of pneumonia. Lungs elsewhere clear. Stable cardiac silhouette. Aortic Atherosclerosis (ICD10-I70.0). Electronically Signed   By: Lowella Grip III M.D.   On: 10/22/2020 13:44   DG CHEST PORT 1 VIEW  Result Date: 10/20/2020 CLINICAL DATA:  Hypoxia, shortness of breath. EXAM: PORTABLE CHEST 1 VIEW COMPARISON:  Chest x-ray 10/19/2020, chest x-ray chest x-ray 10/19/2020, CT abdomen pelvis 06/18/2018 FINDINGS: Enteric tube courses below the hemidiaphragm with tip and side port collimated off view. The heart size and mediastinal contours are unchanged. Aortic arch calcification. Flattening of the  hemidiaphragms suggestive of emphysematous changes. Redemonstration of nodular-like density at the left base. No pulmonary edema. Interval development of a trace left pleural effusion. No right pleural effusion. No pneumothorax. No acute osseous abnormality. IMPRESSION: 1. Interval development of trace left pleural effusion. 2. Persistent nodular-like density at the left base. 3. Aortic Atherosclerosis (ICD10-I70.0) and Emphysema (ICD10-J43.9). Electronically Signed   By: Iven Finn M.D.   On: 10/20/2020 20:42   DG Chest Portable 1 View  Result Date: 10/19/2020 CLINICAL DATA:  Abdominal pain and vomiting. EXAM: PORTABLE CHEST 1 VIEW COMPARISON:  April 16, 2018 FINDINGS: The heart size and mediastinal contours are within normal limits. There is marked severity calcification and tortuosity of the thoracic aorta. Both lungs are clear. The visualized skeletal structures are unremarkable. IMPRESSION: No active cardiopulmonary disease. Electronically Signed   By: Virgina Norfolk M.D.   On: 10/19/2020 21:38   DG Abd Portable 1V-Small Bowel Obstruction Protocol-24 hr delay  Result Date: 10/21/2020 CLINICAL DATA:  Small-bowel obstruction. EXAM: PORTABLE ABDOMEN - 1 VIEW COMPARISON:  10/20/2020.  CT 10/19/2020. FINDINGS: Interim removal of NG tube. Recurrent multiple slightly dilated loops of small bowel noted on today's exam. Colonic gas pattern is unremarkable. Oral contrast has passed from the colon. No free air. Degenerative change scratched it severe scoliosis lumbar spine. Degenerative changes lumbar spine and both hips. Aortoiliac and peripheral atherosclerotic vascular calcification. IMPRESSION: Interim removal of NG tube. Recurrent multiple slightly dilated loops of small bowel noted on today's exam. Colonic gas pattern is unremarkable. Oral contrast has passed from the colon. Electronically Signed   By: Marcello Moores  Register   On: 10/21/2020 13:11   DG Abd Portable 1V-Small Bowel Obstruction  Protocol-initial, 8 hr delay  Result Date: 10/20/2020 CLINICAL DATA:  8 hour delay small bowel protocol. EXAM: PORTABLE ABDOMEN - 1 VIEW COMPARISON:  October 20, 2020 (1:52 a.m.) FINDINGS: A nasogastric tube is seen with its distal tip overlying the expected region of the gastric antrum. The bowel gas pattern is normal. Radiopaque contrast is seen throughout the large bowel and within the lumen of the urinary bladder. No radio-opaque calculi or other significant radiographic abnormality are seen. Marked severity levoscoliosis of the lumbar spine is noted. IMPRESSION: 1. Nasogastric tube positioning, as described above. 2. Radiopaque contrast throughout the large bowel. Electronically Signed   By: Virgina Norfolk M.D.   On: 10/20/2020 21:18      Discharge Exam: Vitals:   10/22/20 2024 10/23/20 0544  BP: (!) 154/54 (!) 145/56  Pulse: (!) 58 63  Resp: 16 16  Temp: (!) 97.5 F (36.4 C) (!) 97.4 F (36.3 C)  SpO2: 94% 92%   Vitals:   10/22/20 0600 10/22/20 1350 10/22/20 2024 10/23/20 0544  BP: (!) 142/89 133/72 (!) 154/54 (!) 145/56  Pulse: 69 64 (!) 58 63  Resp: 18 18 16 16   Temp: 98.1 F (36.7 C) 98.5 F (36.9 C) (!) 97.5 F (36.4 C) (!) 97.4 F (36.3 C)  TempSrc: Oral Oral Oral Oral  SpO2: (!) 87% 90% 94% 92%    General: Pt is alert, awake, not in acute distress Cardiovascular: RRR, S1/S2 +, no rubs, no gallops Respiratory: CTA bilaterally, no wheezing, no rhonchi  Abdominal: Soft, NT, ND, bowel sounds + Extremities: no edema, no cyanosis    The results of significant diagnostics from this hospitalization (including imaging, microbiology, ancillary and laboratory) are listed below for reference.     Microbiology: Recent Results (from the past 240 hour(s))  Resp Panel by RT-PCR (Flu A&B, Covid) Nasopharyngeal Swab     Status: None   Collection Time: 10/19/20 10:49 PM   Specimen: Nasopharyngeal Swab; Nasopharyngeal(NP) swabs in vial transport medium  Result Value Ref  Range Status   SARS Coronavirus 2 by RT PCR NEGATIVE NEGATIVE Final    Comment: (NOTE) SARS-CoV-2 target nucleic acids are NOT DETECTED.  The SARS-CoV-2 RNA is generally detectable in upper respiratory specimens during the acute phase of infection. The lowest concentration of SARS-CoV-2 viral copies this assay can detect is 138 copies/mL. A negative result does not preclude SARS-Cov-2 infection and should not be used as the sole basis for treatment or other patient management decisions. A negative result may occur with  improper specimen collection/handling, submission of specimen other than nasopharyngeal swab, presence of viral mutation(s) within the areas targeted by this assay, and inadequate number of viral copies(<138 copies/mL). A negative result must be combined with clinical observations, patient history, and epidemiological information. The expected result is Negative.  Fact Sheet for Patients:  EntrepreneurPulse.com.au  Fact Sheet for Healthcare Providers:  IncredibleEmployment.be  This test is no t yet approved or cleared by the [place] FDA and  has been authorized for detection and/or diagnosis of SARS-CoV-2 by FDA under an Emergency Use Authorization (EUA). This EUA will remain  in effect (meaning this test can be used) for the duration of the COVID-19 declaration under Section 564(b)(1) of the Act, 21 U.S.C.section 360bbb-3(b)(1), unless the authorization is terminated  or revoked sooner.       Influenza A by PCR NEGATIVE NEGATIVE Final   Influenza B by PCR NEGATIVE NEGATIVE Final    Comment: (NOTE) The Xpert Xpress SARS-CoV-2/FLU/RSV plus assay is intended as an aid in the diagnosis of influenza from Nasopharyngeal swab specimens and should not be used as a sole basis for treatment. Nasal washings and aspirates are unacceptable for Xpert Xpress SARS-CoV-2/FLU/RSV testing.  Fact Sheet for  Patients: EntrepreneurPulse.com.au  Fact Sheet for Healthcare Providers: IncredibleEmployment.be  This test is not yet approved or cleared by the [place] FDA and has been authorized for detection and/or diagnosis of SARS-CoV-2 by FDA under an Emergency Use Authorization (EUA). This EUA will remain in effect (meaning this test can be used) for the duration of the COVID-19 declaration under Section 564(b)(1) of the Act, 21 U.S.C. section 360bbb-3(b)(1), unless the authorization is terminated or revoked.  Performed at [facility], [place] [address] [place], [place] [address]      Labs: BNP (last 3 results) No results for input(s): BNP in the last 8760 hours. Basic Metabolic Panel: Recent Labs  Lab 10/19/20 1415 10/20/20 0440 10/21/20 0518  NA 140 138 143  K 4.3 4.7 3.6  CL 95* 100 104  CO2 28 27 27   GLUCOSE 122* 134* 86  BUN 27* 40* 27*  CREATININE 1.46* 1.41* 0.90  CALCIUM 11.3* 9.6 8.8*  MG  --   --  1.9   Liver Function Tests: Recent Labs  Lab 10/19/20 1415 10/20/20 0440 10/21/20 0518  AST 25 20 18   ALT 24 18 16   ALKPHOS 85 65 54  BILITOT 1.2 1.2 1.2  PROT 7.7 6.2* 5.5*  ALBUMIN 4.2 3.4* 3.0*  Recent Labs  Lab 10/19/20 1415  LIPASE 90*   No results for input(s): AMMONIA in the last 168 hours. CBC: Recent Labs  Lab 10/19/20 1415 10/20/20 0440 10/21/20 0518  WBC 11.2* 8.5 5.2  HGB 16.2* 14.4 13.0  HCT 50.1* 46.7* 42.0  MCV 94.4 97.9 99.1  PLT 160 138* 112*   Cardiac Enzymes: No results for input(s): CKTOTAL, CKMB, CKMBINDEX, TROPONINI in the last 168 hours. BNP: Invalid input(s): POCBNP CBG: Recent Labs  Lab 10/22/20 0756 10/22/20 1207 10/22/20 1624 10/22/20 2143 10/23/20 0722  GLUCAP 98 101* 115* 111* 98   D-Dimer No results for input(s): DDIMER in the last 72 hours. Hgb A1c No results for input(s): HGBA1C in the last 72 hours. Lipid Profile No results for input(s): CHOL,  HDL, LDLCALC, TRIG, CHOLHDL, LDLDIRECT in the last 72 hours. Thyroid function studies No results for input(s): TSH, T4TOTAL, T3FREE, THYROIDAB in the last 72 hours.  Invalid input(s): FREET3 Anemia work up No results for input(s): VITAMINB12, FOLATE, FERRITIN, TIBC, IRON, RETICCTPCT in the last 72 hours. Urinalysis    Component Value Date/Time   COLORURINE YELLOW 10/19/2020 1140   APPEARANCEUR HAZY (A) 10/19/2020 1140   LABSPEC 1.041 (H) 10/19/2020 1140   PHURINE 5.0 10/19/2020 1140   GLUCOSEU NEGATIVE 10/19/2020 1140   HGBUR NEGATIVE 10/19/2020 1140   BILIRUBINUR NEGATIVE 10/19/2020 1140   KETONESUR 5 (A) 10/19/2020 1140   PROTEINUR 30 (A) 10/19/2020 1140   UROBILINOGEN 0.2 04/06/2014 0715   NITRITE POSITIVE (A) 10/19/2020 1140   LEUKOCYTESUR MODERATE (A) 10/19/2020 1140   Sepsis Labs Invalid input(s): PROCALCITONIN,  WBC,  LACTICIDVEN Microbiology Recent Results (from the past 240 hour(s))  Resp Panel by RT-PCR (Flu A&B, Covid) Nasopharyngeal Swab     Status: None   Collection Time: 10/19/20 10:49 PM   Specimen: Nasopharyngeal Swab; Nasopharyngeal(NP) swabs in vial transport medium  Result Value Ref Range Status   SARS Coronavirus 2 by RT PCR NEGATIVE NEGATIVE Final    Comment: (NOTE) SARS-CoV-2 target nucleic acids are NOT DETECTED.  The SARS-CoV-2 RNA is generally detectable in upper respiratory specimens during the acute phase of infection. The lowest concentration of SARS-CoV-2 viral copies this assay can detect is 138 copies/mL. A negative result does not preclude SARS-Cov-2 infection and should not be used as the sole basis for treatment or other patient management decisions. A negative result may occur with  improper specimen collection/handling, submission of specimen other than nasopharyngeal swab, presence of viral mutation(s) within the areas targeted by this assay, and inadequate number of viral copies(<138 copies/mL). A negative result must be combined  with clinical observations, patient history, and epidemiological information. The expected result is Negative.  Fact Sheet for Patients:  EntrepreneurPulse.com.au  Fact Sheet for Healthcare Providers:  IncredibleEmployment.be  This test is no t yet approved or cleared by the [place] FDA and  has been authorized for detection and/or diagnosis of SARS-CoV-2 by FDA under an Emergency Use Authorization (EUA). This EUA will remain  in effect (meaning this test can be used) for the duration of the COVID-19 declaration under Section 564(b)(1) of the Act, 21 U.S.C.section 360bbb-3(b)(1), unless the authorization is terminated  or revoked sooner.       Influenza A by PCR NEGATIVE NEGATIVE Final   Influenza B by PCR NEGATIVE NEGATIVE Final    Comment: (NOTE) The Xpert Xpress SARS-CoV-2/FLU/RSV plus assay is intended as an aid in the diagnosis of influenza from Nasopharyngeal swab specimens and should not be used as a sole  basis for treatment. Nasal washings and aspirates are unacceptable for Xpert Xpress SARS-CoV-2/FLU/RSV testing.  Fact Sheet for Patients: EntrepreneurPulse.com.au  Fact Sheet for Healthcare Providers: IncredibleEmployment.be  This test is not yet approved or cleared by the [place] FDA and has been authorized for detection and/or diagnosis of SARS-CoV-2 by FDA under an Emergency Use Authorization (EUA). This EUA will remain in effect (meaning this test can be used) for the duration of the COVID-19 declaration under Section 564(b)(1) of the Act, 21 U.S.C. section 360bbb-3(b)(1), unless the authorization is terminated or revoked.  Performed at [facility], [place] [address] [place],  [address]      Time coordinating discharge: 35 minutes  SIGNED:   Rodena Goldmann, DO [facility] 10/23/2020, 10:01 AM  If 7PM-7AM, please contact  night-coverage [email or web address]

## 2020-10-23 NOTE — Progress Notes (Signed)
Patient was given discharge instructions, and all questions were answered.  Patient was stable for discharge and taken to the main exit by wheelchair. 

## 2020-10-25 LAB — LEGIONELLA PNEUMOPHILA SEROGP 1 UR AG: L. pneumophila Serogp 1 Ur Ag: NEGATIVE

## 2020-11-02 DIAGNOSIS — Z09 Encounter for follow-up examination after completed treatment for conditions other than malignant neoplasm: Secondary | ICD-10-CM | POA: Diagnosis not present

## 2020-11-02 DIAGNOSIS — K838 Other specified diseases of biliary tract: Secondary | ICD-10-CM | POA: Diagnosis not present

## 2020-11-02 DIAGNOSIS — N179 Acute kidney failure, unspecified: Secondary | ICD-10-CM | POA: Diagnosis not present

## 2020-11-02 DIAGNOSIS — K56600 Partial intestinal obstruction, unspecified as to cause: Secondary | ICD-10-CM | POA: Diagnosis not present

## 2020-11-02 DIAGNOSIS — J189 Pneumonia, unspecified organism: Secondary | ICD-10-CM | POA: Diagnosis not present

## 2020-11-06 ENCOUNTER — Other Ambulatory Visit: Payer: Self-pay

## 2020-11-06 ENCOUNTER — Ambulatory Visit: Payer: PPO | Admitting: Vascular Surgery

## 2020-11-06 ENCOUNTER — Encounter: Payer: Self-pay | Admitting: Vascular Surgery

## 2020-11-06 VITALS — BP 144/65 | HR 69 | Temp 98.2°F | Resp 20 | Ht 63.0 in | Wt 126.0 lb

## 2020-11-06 DIAGNOSIS — I70212 Atherosclerosis of native arteries of extremities with intermittent claudication, left leg: Secondary | ICD-10-CM

## 2020-11-06 NOTE — Progress Notes (Signed)
Patient ID: [name], female   DOB: 22-Sep-1929, 87 y.o.   MRN: [id number]  Reason for Consult: New Patient (Initial Visit)   Referred by Carol Ada, MD  Subjective:     HPI:  [name] is a 87 y.o. female with history of left lower extremity DVT and subsequent swelling.  She is not on any blood thinners at this time.  She walks on the treadmill daily.  She states that she does occasionally get cramping in her left calf which causes her to stop at which time it resolves.  She has no symptoms on the right side.  She remains very active.  Recently admitted with small bowel obstruction this has mostly resolved although she does still have some abdominal distention she is eating well her bowels are working.  Denies any tissue loss or ulceration of her bilateral lower extremities.  Does not have any previous history of stroke, TIA or amaurosis.  Denies personal or family history of aneurysm disease.  Past Medical History:  Diagnosis Date  . Chronic kidney disease   . DVT (deep venous thrombosis) ([place])   . Dysphonia   . Factor V Leiden ([place])   . Hypertension   . Osteoporosis   . PNA (pneumonia) 12/31/2012  . Pneumonitis 03/19/2013  . SBO (small bowel obstruction) ([place]) 06/2018  . Small bowel obstruction ([facility])    Family History  Family history unknown: Yes   Past Surgical History:  Procedure Laterality Date  . ABDOMINAL HYSTERECTOMY    . APPENDECTOMY    . LAPAROTOMY N/A 01/01/2013   Procedure: EXPLORATORY LAPAROTOMY removal of smal bowel foreign body;  Surgeon: Madilyn Hook, DO;  Location: [facility];  Service: General;  Laterality: N/A;  . TONSILLECTOMY      Short Social History:  Social History   Tobacco Use  . Smoking status: Former Smoker    Quit date: 05/04/1999    Years since quitting: 21.5  . Smokeless tobacco: Never Used  Substance Use Topics  . Alcohol use: Yes    Comment: occasionally    Allergies  Allergen Reactions  . Diovan [Valsartan] Other (See  Comments)    gas  . Hctz [Hydrochlorothiazide]     gas  . Tekamlo [Aliskiren-Amlodipine]     gas  . Valturna [Aliskiren-Valsartan]     Gas     Current Outpatient Medications  Medication Sig Dispense Refill  . acetaminophen (TYLENOL) 325 MG tablet Take 2 tablets (650 mg total) by mouth every 4 (four) hours as needed. (Patient taking differently: Take 650 mg by mouth every 4 (four) hours as needed for mild pain.)    . albuterol (VENTOLIN HFA) 108 (90 Base) MCG/ACT inhaler Inhale 2 puffs into the lungs every 6 (six) hours as needed for wheezing or shortness of breath. 8 g 2  . amLODipine (NORVASC) 10 MG tablet Take 10 mg by mouth daily.    Marland Kitchen aspirin EC 81 MG tablet Take 81 mg by mouth at bedtime.     . calcium carbonate (TUMS - DOSED IN MG ELEMENTAL CALCIUM) 500 MG chewable tablet Chew 1-2 tablets (200-400 mg of elemental calcium total) by mouth 3 (three) times daily as needed. (Patient taking differently: Chew 1-2 tablets by mouth 3 (three) times daily as needed for indigestion.)    . cholecalciferol (VITAMIN D) 1000 units tablet Take 1,000 Units by mouth at bedtime.    . DENTAGEL 1.1 % GEL dental gel Take 1 application by mouth daily. Place on toothbrush and  brush daily for 2 minutes to prevent tooth decay/sensitivity  3  . ferrous sulfate 325 (65 FE) MG tablet Take 325 mg by mouth daily with breakfast. Unknown dose    . hydroxypropyl methylcellulose / hypromellose (ISOPTO TEARS / GONIOVISC) 2.5 % ophthalmic solution Place 1 drop into both eyes as needed for dry eyes.    Marland Kitchen loratadine (CLARITIN) 10 MG tablet Take 10 mg by mouth daily as needed for allergies.     . metoprolol succinate (TOPROL-XL) 25 MG 24 hr tablet Take 25 mg by mouth daily.    . Multiple Vitamin (MULTIVITAMIN WITH MINERALS) TABS Take 1 tablet by mouth daily.    Marland Kitchen olmesartan (BENICAR) 20 MG tablet Take 20 mg by mouth daily.  3  . ondansetron (ZOFRAN) 4 MG tablet Take 1 tablet (4 mg total) by mouth every 6 (six) hours as  needed for nausea. 20 tablet 0  . psyllium (METAMUCIL) 58.6 % packet Take 1 packet by mouth at bedtime.     . rosuvastatin (CRESTOR) 5 MG tablet Take 5 mg by mouth at bedtime.     . senna (SENOKOT) 8.6 MG TABS tablet Take 1 tablet (8.6 mg total) by mouth at bedtime. 30 each 0   No current facility-administered medications for this visit.    Review of Systems  Constitutional:  Constitutional negative. HENT: HENT negative.  Eyes: Eyes negative.  Cardiovascular: Positive for claudication.  GI: Positive for abdominal pain.  Musculoskeletal: Musculoskeletal negative.  Skin: Skin negative.  Neurological: Neurological negative. Hematologic: Hematologic/lymphatic negative.  Psychiatric: Psychiatric negative.        Objective:  Objective   Vitals:   11/06/20 1104  BP: (!) 144/65  Pulse: 69  Resp: 20  Temp: 98.2 F (36.8 C)  SpO2: 93%    Physical Exam HENT:     Head: Normocephalic.     Nose:     Comments: Wearing a mask    Mouth/Throat:     Mouth: Mucous membranes are moist.  Eyes:     Pupils: Pupils are equal, round, and reactive to light.  Neck:     Vascular: No carotid bruit.  Cardiovascular:     Rate and Rhythm: Regular rhythm.     Pulses:          Radial pulses are 2+ on the right side and 2+ on the left side.       Popliteal pulses are 0 on the right side and 0 on the left side.     Heart sounds: Normal heart sounds.  Pulmonary:     Effort: Pulmonary effort is normal.     Breath sounds: Normal breath sounds.  Abdominal:     General: Abdomen is flat. There is distension.     Palpations: Abdomen is soft. There is no mass.     Tenderness: There is no abdominal tenderness.  Musculoskeletal:        General: Normal range of motion.     Left lower leg: No edema.  Skin:    General: Skin is warm and dry.     Capillary Refill: Capillary refill takes 2 to 3 seconds.  Neurological:     General: No focal deficit present.     Mental Status: She is alert.   Psychiatric:        Mood and Affect: Mood normal.        Behavior: Behavior normal.        Thought Content: Thought content normal.  Judgment: Judgment normal.     Data: FINDINGS: Right Lower Extremity  Resting ABI:  0.63  Resting TBI: 0.34  Post Exercise ABI:0.55  Segmental Pressures: Significant pressure gradient between the thigh cuff and popliteal cuff consistent with occlusive disease. Great toe pressure: 46 mm Hg  Arterial Waveforms: Abnormal waveforms in the popliteal posterior tibial and dorsalis pedis arteries.  PVRs: PVR suggest distal femoropopliteal disease.  Left Lower Extremity:  Resting ABI: 0.57  Resting TBI: 0.29  Post Exercise ABI:0.24     Assessment/Plan:     87 year old female appears to have left lower extremity claudication with bilateral decreased ABIs.  At this time she is able to walk really as much she wants she does walk the treadmill daily.  I discussed with her continuing to walk daily as long as her symptoms do not worsen she should be okay with very low risk of future amputation.  If her symptoms do worsen she will call to see [place] otherwise we will see her in 1 year with repeat ABIs with exercise.     Waynetta Sandy MD [facility]

## 2020-11-17 DIAGNOSIS — I10 Essential (primary) hypertension: Secondary | ICD-10-CM | POA: Diagnosis not present

## 2020-11-17 DIAGNOSIS — N1831 Chronic kidney disease, stage 3a: Secondary | ICD-10-CM | POA: Diagnosis not present

## 2020-11-17 DIAGNOSIS — E7800 Pure hypercholesterolemia, unspecified: Secondary | ICD-10-CM | POA: Diagnosis not present

## 2020-11-17 DIAGNOSIS — M810 Age-related osteoporosis without current pathological fracture: Secondary | ICD-10-CM | POA: Diagnosis not present

## 2020-11-19 ENCOUNTER — Other Ambulatory Visit: Payer: Self-pay | Admitting: Physician Assistant

## 2020-11-19 ENCOUNTER — Other Ambulatory Visit: Payer: Self-pay

## 2020-11-19 DIAGNOSIS — K838 Other specified diseases of biliary tract: Secondary | ICD-10-CM | POA: Diagnosis not present

## 2020-11-19 DIAGNOSIS — R195 Other fecal abnormalities: Secondary | ICD-10-CM | POA: Diagnosis not present

## 2020-11-19 DIAGNOSIS — Z8719 Personal history of other diseases of the digestive system: Secondary | ICD-10-CM | POA: Diagnosis not present

## 2020-12-08 ENCOUNTER — Ambulatory Visit
Admission: RE | Admit: 2020-12-08 | Discharge: 2020-12-08 | Disposition: A | Discharge status: Home / Self Care | Payer: PPO | Source: Ambulatory Visit | Attending: Physician Assistant | Admitting: Physician Assistant

## 2020-12-08 DIAGNOSIS — N1330 Unspecified hydronephrosis: Secondary | ICD-10-CM | POA: Diagnosis not present

## 2020-12-08 DIAGNOSIS — K838 Other specified diseases of biliary tract: Secondary | ICD-10-CM

## 2020-12-08 DIAGNOSIS — K8020 Calculus of gallbladder without cholecystitis without obstruction: Secondary | ICD-10-CM | POA: Diagnosis not present

## 2020-12-08 MED ORDER — GADOBENATE DIMEGLUMINE 529 MG/ML IV SOLN
12.0000 mL | Freq: Once | INTRAVENOUS | Status: AC | PRN
  Administered 2020-12-08: 12 mL via INTRAVENOUS

## 2020-12-17 DIAGNOSIS — K838 Other specified diseases of biliary tract: Secondary | ICD-10-CM | POA: Diagnosis not present

## 2020-12-29 DIAGNOSIS — N1831 Chronic kidney disease, stage 3a: Secondary | ICD-10-CM | POA: Diagnosis not present

## 2020-12-29 DIAGNOSIS — E7800 Pure hypercholesterolemia, unspecified: Secondary | ICD-10-CM | POA: Diagnosis not present

## 2020-12-29 DIAGNOSIS — M810 Age-related osteoporosis without current pathological fracture: Secondary | ICD-10-CM | POA: Diagnosis not present

## 2020-12-29 DIAGNOSIS — I10 Essential (primary) hypertension: Secondary | ICD-10-CM | POA: Diagnosis not present
